# Patient Record
Sex: Male | Born: 1950 | Race: Black or African American | Hispanic: No | Marital: Single | Smoking: Current every day smoker
Health system: Southern US, Community
[De-identification: ages and names within clinical notes are randomized; demographics above are authoritative.]

## PROBLEM LIST (undated history)

## (undated) DIAGNOSIS — F32A Depression, unspecified: Secondary | ICD-10-CM

## (undated) DIAGNOSIS — F329 Major depressive disorder, single episode, unspecified: Secondary | ICD-10-CM

## (undated) DIAGNOSIS — Z91199 Patient's noncompliance with other medical treatment and regimen due to unspecified reason: Secondary | ICD-10-CM

## (undated) DIAGNOSIS — F209 Schizophrenia, unspecified: Secondary | ICD-10-CM

## (undated) DIAGNOSIS — K219 Gastro-esophageal reflux disease without esophagitis: Secondary | ICD-10-CM

## (undated) DIAGNOSIS — J189 Pneumonia, unspecified organism: Secondary | ICD-10-CM

## (undated) DIAGNOSIS — F1910 Other psychoactive substance abuse, uncomplicated: Secondary | ICD-10-CM

## (undated) DIAGNOSIS — I509 Heart failure, unspecified: Secondary | ICD-10-CM

## (undated) DIAGNOSIS — E119 Type 2 diabetes mellitus without complications: Secondary | ICD-10-CM

## (undated) DIAGNOSIS — I10 Essential (primary) hypertension: Secondary | ICD-10-CM

## (undated) DIAGNOSIS — G43909 Migraine, unspecified, not intractable, without status migrainosus: Secondary | ICD-10-CM

## (undated) DIAGNOSIS — D649 Anemia, unspecified: Secondary | ICD-10-CM

## (undated) DIAGNOSIS — N183 Chronic kidney disease, stage 3 unspecified: Secondary | ICD-10-CM

## (undated) DIAGNOSIS — I82409 Acute embolism and thrombosis of unspecified deep veins of unspecified lower extremity: Secondary | ICD-10-CM

## (undated) DIAGNOSIS — Z9119 Patient's noncompliance with other medical treatment and regimen: Secondary | ICD-10-CM

## (undated) HISTORY — PX: NO PAST SURGERIES: SHX2092

---

## 2003-01-15 ENCOUNTER — Encounter: Payer: Self-pay | Admitting: Internal Medicine

## 2003-01-15 ENCOUNTER — Inpatient Hospital Stay (HOSPITAL_COMMUNITY): Admission: EM | Admit: 2003-01-15 | Discharge: 2003-01-18 | Payer: Self-pay | Admitting: Emergency Medicine

## 2003-05-10 ENCOUNTER — Emergency Department (HOSPITAL_COMMUNITY): Admission: EM | Admit: 2003-05-10 | Discharge: 2003-05-10 | Payer: Self-pay | Admitting: Emergency Medicine

## 2003-11-20 ENCOUNTER — Inpatient Hospital Stay (HOSPITAL_COMMUNITY): Admission: EM | Admit: 2003-11-20 | Discharge: 2003-11-22 | Payer: Self-pay | Admitting: *Deleted

## 2003-12-25 ENCOUNTER — Emergency Department (HOSPITAL_COMMUNITY): Admission: EM | Admit: 2003-12-25 | Discharge: 2003-12-25 | Payer: Self-pay | Admitting: Emergency Medicine

## 2004-02-04 ENCOUNTER — Emergency Department (HOSPITAL_COMMUNITY): Admission: EM | Admit: 2004-02-04 | Discharge: 2004-02-04 | Payer: Self-pay | Admitting: Emergency Medicine

## 2004-06-20 ENCOUNTER — Emergency Department (HOSPITAL_COMMUNITY): Admission: EM | Admit: 2004-06-20 | Discharge: 2004-06-20 | Payer: Self-pay | Admitting: Emergency Medicine

## 2011-11-30 ENCOUNTER — Inpatient Hospital Stay (HOSPITAL_COMMUNITY)
Admission: EM | Admit: 2011-11-30 | Discharge: 2011-12-04 | DRG: 638 | Disposition: A | Discharge status: Home / Self Care | Payer: Medicaid Other | Attending: Internal Medicine | Admitting: Internal Medicine

## 2011-11-30 ENCOUNTER — Encounter: Payer: Self-pay | Admitting: Emergency Medicine

## 2011-11-30 DIAGNOSIS — I10 Essential (primary) hypertension: Secondary | ICD-10-CM | POA: Diagnosis present

## 2011-11-30 DIAGNOSIS — I4510 Unspecified right bundle-branch block: Secondary | ICD-10-CM | POA: Diagnosis present

## 2011-11-30 DIAGNOSIS — E1100 Type 2 diabetes mellitus with hyperosmolarity without nonketotic hyperglycemic-hyperosmolar coma (NKHHC): Principal | ICD-10-CM | POA: Diagnosis present

## 2011-11-30 DIAGNOSIS — Z794 Long term (current) use of insulin: Secondary | ICD-10-CM

## 2011-11-30 DIAGNOSIS — R112 Nausea with vomiting, unspecified: Secondary | ICD-10-CM | POA: Diagnosis present

## 2011-11-30 DIAGNOSIS — E7251 Non-ketotic hyperglycinemia: Secondary | ICD-10-CM

## 2011-11-30 DIAGNOSIS — E871 Hypo-osmolality and hyponatremia: Secondary | ICD-10-CM | POA: Diagnosis present

## 2011-11-30 LAB — URINALYSIS, ROUTINE W REFLEX MICROSCOPIC
Bilirubin Urine: NEGATIVE
Glucose, UA: >1000 mg/dL — AB
Hgb urine dipstick: NEGATIVE
Ketones, ur: NEGATIVE mg/dL
Leukocytes, UA: NEGATIVE
Nitrite: NEGATIVE
Protein, ur: NEGATIVE mg/dL
Specific Gravity, Urine: <1.005 — ABNORMAL LOW (ref 1.005–1.030)
Urobilinogen, UA: 0.2 mg/dL (ref 0.0–1.0)
pH: 5.5 (ref 5.0–8.0)

## 2011-11-30 LAB — CBC
HCT: 43.3 % (ref 39.0–52.0)
Hemoglobin: 14 g/dL (ref 13.0–17.0)
MCH: 29.4 pg (ref 26.0–34.0)
MCHC: 32.3 g/dL (ref 30.0–36.0)
MCV: 90.8 fL (ref 78.0–100.0)
Platelets: 213 10*3/uL (ref 150–400)
RBC: 4.77 MIL/uL (ref 4.22–5.81)
RDW: 13.4 % (ref 11.5–15.5)
WBC: 4.2 10*3/uL (ref 4.0–10.5)

## 2011-11-30 LAB — DIFFERENTIAL
Basophils Absolute: 0 10*3/uL (ref 0.0–0.1)
Basophils Relative: 0 % (ref 0–1)
Eosinophils Absolute: 0.1 10*3/uL (ref 0.0–0.7)
Eosinophils Relative: 2 % (ref 0–5)
Lymphocytes Relative: 28 % (ref 12–46)
Lymphs Abs: 1.2 10*3/uL (ref 0.7–4.0)
Monocytes Absolute: 0.4 10*3/uL (ref 0.1–1.0)
Monocytes Relative: 9 % (ref 3–12)
Neutro Abs: 2.6 10*3/uL (ref 1.7–7.7)
Neutrophils Relative %: 61 % (ref 43–77)

## 2011-11-30 LAB — URINE MICROSCOPIC-ADD ON

## 2011-11-30 MED ORDER — SODIUM CHLORIDE 0.9 % IV SOLN
Freq: Once | INTRAVENOUS | Status: AC
  Administered 2011-11-30: 22:00:00 via INTRAVENOUS

## 2011-11-30 MED ORDER — ONDANSETRON HCL 4 MG/2ML IJ SOLN
4.0000 mg | Freq: Once | INTRAMUSCULAR | Status: AC
  Administered 2011-11-30: 4 mg via INTRAVENOUS
  Filled 2011-11-30: qty 2

## 2011-11-30 NOTE — ED Provider Notes (Signed)
History     CSN: [id number]  Arrival date & time 11/30/11  2106   First MD Initiated Contact with Patient 11/30/11 2140      Chief Complaint  Patient presents with  . Nausea  . Emesis  . Hyperglycemia    (Consider location/radiation/quality/duration/timing/severity/associated sxs/prior treatment) HPI Comments: Patient with history of DM.  I am told his sugars are high by EMS.    Patient is a 60 y.o. male presenting with vomiting.  Emesis  This is a new problem. The current episode started 1 to 2 hours ago. The problem has been gradually worsening. The emesis has an appearance of stomach contents. There has been no fever. Pertinent negatives include no abdominal pain and no chills.    Past Medical History  Diagnosis Date  . Hypertension   . Diabetes mellitus     History reviewed. No pertinent past surgical history.  History reviewed. No pertinent family history.  History  Substance Use Topics  . Smoking status: Not on file  . Smokeless tobacco: Not on file  . Alcohol Use: Yes     occassional      Review of Systems  Constitutional: Negative for chills.  Gastrointestinal: Positive for vomiting. Negative for abdominal pain.  All other systems reviewed and are negative.    Allergies  Review of patient's allergies indicates no known allergies.  Home Medications   Current Outpatient Rx  Name Route Sig Dispense Refill  . INSULIN ASPART 100 UNIT/ML Unity SOLN Subcutaneous Inject into the skin 3 (three) times daily before meals.      Marland Kitchen LISINOPRIL 10 MG PO TABS Oral Take 10 mg by mouth daily.        BP 160/105  Pulse 107  Temp 98.2 F (36.8 C)  SpO2 97%  Physical Exam  Constitutional: He is oriented to person, place, and time. He appears well-developed and well-nourished.  HENT:  Head: Normocephalic and atraumatic.       Oropharynx dry.  Neck: Normal range of motion. Neck supple.  Cardiovascular: Normal rate and regular rhythm.  Exam reveals no friction  rub.   No murmur heard. Pulmonary/Chest: Effort normal and breath sounds normal. No respiratory distress. He has no wheezes.  Abdominal: Soft. Bowel sounds are normal. He exhibits no distension. There is no tenderness.  Musculoskeletal: Normal range of motion. He exhibits no edema.  Lymphadenopathy:    He has no cervical adenopathy.  Neurological: He is alert and oriented to person, place, and time.  Skin: Skin is warm.    ED Course  Procedures (including critical care time)  Labs Reviewed - No data to display No results found.   No diagnosis found.   Date: 12/01/2011  Rate: 100  Rhythm: sinus tachycardia  QRS Axis: normal  Intervals: normal  ST/T Wave abnormalities: nonspecific ST changes  Conduction Disutrbances:right bundle branch block  Narrative Interpretation:   Old EKG Reviewed: unchanged    MDM  Patient with profound hyperglycemia, but no evidence of ketoacidosis.  Gave 10 units of novolog, will consult medicine for admission, regulation of sugars.          Geoffery Lyons, MD 12/01/11 [phone]

## 2011-12-01 ENCOUNTER — Encounter (HOSPITAL_COMMUNITY): Payer: Self-pay | Admitting: Internal Medicine

## 2011-12-01 DIAGNOSIS — E871 Hypo-osmolality and hyponatremia: Secondary | ICD-10-CM | POA: Diagnosis present

## 2011-12-01 LAB — COMPREHENSIVE METABOLIC PANEL
ALT: 37 U/L (ref 0–53)
AST: 23 U/L (ref 0–37)
Albumin: 3.9 g/dL (ref 3.5–5.2)
Alkaline Phosphatase: 272 U/L — ABNORMAL HIGH (ref 39–117)
BUN: 20 mg/dL (ref 6–23)
CO2: 26 mEq/L (ref 19–32)
Calcium: 10.6 mg/dL — ABNORMAL HIGH (ref 8.4–10.5)
Chloride: 86 mEq/L — ABNORMAL LOW (ref 96–112)
Creatinine, Ser: 1.12 mg/dL (ref 0.50–1.35)
GFR calc Af Amer: 82 mL/min — ABNORMAL LOW (ref >90)
GFR calc non Af Amer: 71 mL/min — ABNORMAL LOW (ref >90)
Glucose, Bld: 931 mg/dL (ref 70–99)
Potassium: 4.6 mEq/L (ref 3.5–5.1)
Sodium: 125 mEq/L — ABNORMAL LOW (ref 135–145)
Total Bilirubin: 0.4 mg/dL (ref 0.3–1.2)
Total Protein: 8.6 g/dL — ABNORMAL HIGH (ref 6.0–8.3)

## 2011-12-01 LAB — ETHANOL: Alcohol, Ethyl (B): <11 mg/dL (ref 0–11)

## 2011-12-01 LAB — GLUCOSE, CAPILLARY
Glucose-Capillary: 111 mg/dL — ABNORMAL HIGH (ref 70–99)
Glucose-Capillary: 142 mg/dL — ABNORMAL HIGH (ref 70–99)
Glucose-Capillary: 154 mg/dL — ABNORMAL HIGH (ref 70–99)
Glucose-Capillary: 174 mg/dL — ABNORMAL HIGH (ref 70–99)
Glucose-Capillary: 196 mg/dL — ABNORMAL HIGH (ref 70–99)
Glucose-Capillary: 525 mg/dL — ABNORMAL HIGH (ref 70–99)
Glucose-Capillary: >600 mg/dL (ref 70–99)
Glucose-Capillary: >600 mg/dL (ref 70–99)

## 2011-12-01 LAB — CK TOTAL AND CKMB (NOT AT ARMC)
CK, MB: 5.5 ng/mL — ABNORMAL HIGH (ref 0.3–4.0)
Relative Index: 4.3 — ABNORMAL HIGH (ref 0.0–2.5)
Total CK: 128 U/L (ref 7–232)

## 2011-12-01 LAB — LIPASE, BLOOD: Lipase: 36 U/L (ref 11–59)

## 2011-12-01 LAB — TROPONIN I: Troponin I: <0.3 ng/mL (ref <0.30)

## 2011-12-01 MED ORDER — FOLIC ACID 1 MG PO TABS
1.0000 mg | ORAL_TABLET | Freq: Every day | ORAL | Status: DC
  Administered 2011-12-01 – 2011-12-04 (×5): 1 mg via ORAL
  Filled 2011-12-01 (×4): qty 1

## 2011-12-01 MED ORDER — VITAMIN B-1 100 MG PO TABS
100.0000 mg | ORAL_TABLET | Freq: Every day | ORAL | Status: DC
  Administered 2011-12-01 – 2011-12-04 (×4): 100 mg via ORAL
  Filled 2011-12-01 (×4): qty 1

## 2011-12-01 MED ORDER — INSULIN ASPART 100 UNIT/ML ~~LOC~~ SOLN
0.0000 [IU] | Freq: Three times a day (TID) | SUBCUTANEOUS | Status: DC
  Administered 2011-12-01: 1 [IU] via SUBCUTANEOUS
  Administered 2011-12-02: 7 [IU] via SUBCUTANEOUS
  Filled 2011-12-01: qty 3

## 2011-12-01 MED ORDER — ONDANSETRON HCL 4 MG PO TABS: 4.0000 mg | ORAL_TABLET | Freq: Four times a day (QID) | ORAL | Status: DC | PRN

## 2011-12-01 MED ORDER — SENNA 8.6 MG PO TABS
1.0000 | ORAL_TABLET | Freq: Two times a day (BID) | ORAL | Status: DC
  Administered 2011-12-01 – 2011-12-04 (×6): 8.6 mg via ORAL
  Filled 2011-12-01 (×8): qty 1

## 2011-12-01 MED ORDER — ACETAMINOPHEN 650 MG RE SUPP: 650.0000 mg | Freq: Four times a day (QID) | RECTAL | Status: DC | PRN

## 2011-12-01 MED ORDER — ACETAMINOPHEN 325 MG PO TABS
650.0000 mg | ORAL_TABLET | Freq: Four times a day (QID) | ORAL | Status: DC | PRN
Start: 2011-12-01 — End: 2011-12-04

## 2011-12-01 MED ORDER — SODIUM CHLORIDE 0.9 % IV SOLN
INTRAVENOUS | Status: DC
  Filled 2011-12-01: qty 1

## 2011-12-01 MED ORDER — SODIUM CHLORIDE 0.9 % IV SOLN
INTRAVENOUS | Status: DC
  Administered 2011-12-02 – 2011-12-03 (×4): via INTRAVENOUS

## 2011-12-01 MED ORDER — SODIUM CHLORIDE 0.9 % IV SOLN
INTRAVENOUS | Status: AC
  Administered 2011-12-01: 5.4 [IU]/h via INTRAVENOUS
  Filled 2011-12-01: qty 1

## 2011-12-01 MED ORDER — DOCUSATE SODIUM 100 MG PO CAPS
100.0000 mg | ORAL_CAPSULE | Freq: Two times a day (BID) | ORAL | Status: DC
  Administered 2011-12-01 – 2011-12-04 (×6): 100 mg via ORAL
  Filled 2011-12-01 (×7): qty 1

## 2011-12-01 MED ORDER — INSULIN ASPART 100 UNIT/ML ~~LOC~~ SOLN
10.0000 [IU] | Freq: Once | SUBCUTANEOUS | Status: AC
  Administered 2011-12-01: 10 [IU] via INTRAVENOUS
  Filled 2011-12-01: qty 1

## 2011-12-01 MED ORDER — ONDANSETRON HCL 4 MG/2ML IJ SOLN: 4.0000 mg | Freq: Four times a day (QID) | INTRAMUSCULAR | Status: DC | PRN

## 2011-12-01 MED ORDER — SODIUM CHLORIDE 0.9 % IV SOLN: INTRAVENOUS | Status: DC

## 2011-12-01 MED ORDER — ALUM & MAG HYDROXIDE-SIMETH 200-200-20 MG/5ML PO SUSP
30.0000 mL | Freq: Four times a day (QID) | ORAL | Status: DC | PRN
  Filled 2011-12-01: qty 30

## 2011-12-01 MED ORDER — INSULIN GLARGINE 100 UNIT/ML ~~LOC~~ SOLN
20.0000 [IU] | Freq: Every day | SUBCUTANEOUS | Status: DC
  Administered 2011-12-01: 20 [IU] via SUBCUTANEOUS
  Filled 2011-12-01: qty 3

## 2011-12-01 MED ORDER — SODIUM CHLORIDE 0.9 % IV SOLN
INTRAVENOUS | Status: AC
  Administered 2011-12-01: 125 mL/h via INTRAVENOUS

## 2011-12-01 MED ORDER — INSULIN ASPART 100 UNIT/ML ~~LOC~~ SOLN
0.0000 [IU] | Freq: Every day | SUBCUTANEOUS | Status: DC
  Administered 2011-12-01: 5 [IU] via SUBCUTANEOUS

## 2011-12-01 MED ORDER — DEXTROSE 50 % IV SOLN: 25.0000 mL | INTRAVENOUS | Status: DC | PRN

## 2011-12-01 NOTE — Progress Notes (Signed)
Patient has repeatedly denied the need to void this shift. A urinal was provided and patient encouraged to try to void. He states, "I know if I need to pee." He has refused to try to void. Bladder scan done at 1600 with a result of 197 cc urine. Will continue to monitor. Lurline Idol [facility]

## 2011-12-01 NOTE — Progress Notes (Signed)
CSW covering for the weekend visited with pt and completed psychosocial assessment (pls see shadow chart). Pt reports he just recently moved to [place] from [place] and he has been staying at [facility] at discharge. Pt reports he has already spoken to someone at the shelter and he plans to return there at discharge. Pt states he has family here in town, but he would not say that they are supportive. Pt states he needs assistance with getting medications. He denies any CSW needs at this time. CSW will let CM know that pt has expressed he needs assistance with medications. CSW called the shelter and they report the pt will need a note stating that he was a pt at the hospital, the note needs to include the admission date and discharge date. CSW following the pt will assist in discharge planning to ensure documentation is prepared for pt to return to the shelter.  Clayton [name] 12/01/2011 11:36 AM [phone]

## 2011-12-01 NOTE — ED Notes (Signed)
Pt refused CBG monitoring. 

## 2011-12-01 NOTE — ED Notes (Signed)
Pt. Transferred to the floor with RN and cardiac monitor

## 2011-12-01 NOTE — H&P (Signed)
PCP:   No primary provider on file.  Pt was previously seeing providers through [facility] and [facility], does not identify any provider though.   Chief Complaint:  Vomiting   HPI: 43yoM with h/o diabetes presents with hyperosmolar non-ketotic hyperglycemia.  Pt states he was diagnosed with DM about 7-8 yrs ago and was taking Novolog up until  about a month ago when he ran out of Rx and couldn't get more. He was previously being  seen by providers at [facility], but doesn't identify any single provider. He  is now in [place] and living at what sounds like a shelter.  He states a month ago he had some runny nose and dry cough syndrome. Then about 3 days  ago he started having some vomiting and soreness in his stomach. He endorses polyuria,  dehydration, and polydipsia. However, he is a poor historian and doesn't offer much  information.  In the ED pt was tachy 101-107 and hypertense, o/w stable. Chem panel with Na 125, K  4.6, Cl 86, renal 20/1.12, glucose 930. AlkP 272 o/w LFT's normal. CE's negative x1.  WBC 4.2, rest of CBC normal. UA with spec grav < 1.005, glucose >1000, o/w negative.  Alcohol negative. Pt was given 10u regular insulin, zofran, and IVF's.  ROS as above o/w unremarkable.    Past Medical History  Diagnosis Date  . Hypertension   . Diabetes mellitus     History reviewed. No pertinent past surgical history.  Medications:  HOME MEDS: Not reconcilable by pt  Prior to Admission medications   Medication Sig Start Date End Date Taking? Authorizing Provider  insulin aspart (NOVOLOG) 100 UNIT/ML injection Inject 25 Units into the skin 3 (three) times daily before meals.    Yes Historical Provider, MD  lisinopril (PRINIVIL,ZESTRIL) 10 MG tablet Take 10 mg by mouth daily.    Yes Historical Provider, MD    Allergies:  No Known Allergies  Social History:   reports that he has never smoked. He does not have any smokeless tobacco history on file. He reports that he  drinks alcohol. He reports that he does not use illicit drugs. Lives at [facility], shelter.   Family History: History reviewed. No pertinent family history.  Physical Exam: Filed Vitals:   12/01/11 0045 12/01/11 0100 12/01/11 0115 12/01/11 0135  BP: 131/84 146/120 134/93 144/97  Pulse: 100 102 104 109  Temp:      Resp: 16 17  16   SpO2: 99% 100% 98% 98%   Blood pressure 144/97, pulse 109, temperature 98.2 F (36.8 C), resp. rate 16, SpO2 98.00%. Gen: Middle aged, lethargic but not toxic appearing M in no distress, is quiet and not  very talkative or engaging but pleasant enough. No cardiopulmonary distress. Slightly  malodorous but not overtly disheveled  HEENT: PERRL, EOMI, sclera clear, mouth not actually that dry appearing with some caps  in place, no gross lesions.  Neck: Supple, thin, normal, no thyromegaly noted  Lungs: CTAB no w/c/r, good air movement  Heart: Tachycardic but without gross murmurs, gallops  Abd: Soft, non rigid, non tender, overall benign  Extrem: Hands are cool but not cold or cyanotic. Bilateral radials minimally palpable.  No BLE edema noted. Normal bulk  Neuro: Alert but lethargic appearing, appropriate. Moves extremities spontaneously.  Sits up in bed without assistance. Grossly non-focal     Labs & Imaging Results for orders placed during the hospital encounter of 11/30/11 (from the past 48 hour(s))  URINALYSIS, ROUTINE  W REFLEX MICROSCOPIC     Status: Abnormal   Collection Time   11/30/11 10:04 PM      Component Value Range Comment   Color, Urine STRAW (*) YELLOW     APPearance CLEAR  CLEAR     Specific Gravity, Urine <1.005 (*) 1.005 - 1.030     pH 5.5  5.0 - 8.0     Glucose, UA >1000 (*) NEGATIVE (mg/dL)    Hgb urine dipstick NEGATIVE  NEGATIVE     Bilirubin Urine NEGATIVE  NEGATIVE     Ketones, ur NEGATIVE  NEGATIVE (mg/dL)    Protein, ur NEGATIVE  NEGATIVE (mg/dL)    Urobilinogen, UA 0.2  0.0 - 1.0 (mg/dL)    Nitrite  NEGATIVE  NEGATIVE     Leukocytes, UA NEGATIVE  NEGATIVE    URINE MICROSCOPIC-ADD ON     Status: Normal   Collection Time   11/30/11 10:04 PM      Component Value Range Comment   WBC, UA 0-2  <3 (WBC/hpf)    RBC / HPF 0-2  <3 (RBC/hpf)    Bacteria, UA RARE  RARE     Urine-Other RARE YEAST     CBC     Status: Normal   Collection Time   11/30/11 11:25 PM      Component Value Range Comment   WBC 4.2  4.0 - 10.5 (K/uL)    RBC 4.77  4.22 - 5.81 (MIL/uL)    Hemoglobin 14.0  13.0 - 17.0 (g/dL)    HCT 29.5  62.1 - 30.8 (%)    MCV 90.8  78.0 - 100.0 (fL)    MCH 29.4  26.0 - 34.0 (pg)    MCHC 32.3  30.0 - 36.0 (g/dL)    RDW 65.7  84.6 - 96.2 (%)    Platelets 213  150 - 400 (K/uL)   DIFFERENTIAL     Status: Normal   Collection Time   11/30/11 11:25 PM      Component Value Range Comment   Neutrophils Relative 61  43 - 77 (%)    Neutro Abs 2.6  1.7 - 7.7 (K/uL)    Lymphocytes Relative 28  12 - 46 (%)    Lymphs Abs 1.2  0.7 - 4.0 (K/uL)    Monocytes Relative 9  3 - 12 (%)    Monocytes Absolute 0.4  0.1 - 1.0 (K/uL)    Eosinophils Relative 2  0 - 5 (%)    Eosinophils Absolute 0.1  0.0 - 0.7 (K/uL)    Basophils Relative 0  0 - 1 (%)    Basophils Absolute 0.0  0.0 - 0.1 (K/uL)   COMPREHENSIVE METABOLIC PANEL     Status: Abnormal   Collection Time   11/30/11 11:25 PM      Component Value Range Comment   Sodium 125 (*) 135 - 145 (mEq/L)    Potassium 4.6  3.5 - 5.1 (mEq/L)    Chloride 86 (*) 96 - 112 (mEq/L)    CO2 26  19 - 32 (mEq/L)    Glucose, Bld 931 (*) 70 - 99 (mg/dL)    BUN 20  6 - 23 (mg/dL)    Creatinine, Ser 9.52  0.50 - 1.35 (mg/dL)    Calcium 84.1 (*) 8.4 - 10.5 (mg/dL)    Total Protein 8.6 (*) 6.0 - 8.3 (g/dL)    Albumin 3.9  3.5 - 5.2 (g/dL)    AST 23  0 - 37 (U/L) HEMOLYSIS AT THIS  LEVEL MAY AFFECT RESULT   ALT 37  0 - 53 (U/L)    Alkaline Phosphatase 272 (*) 39 - 117 (U/L)    Total Bilirubin 0.4  0.3 - 1.2 (mg/dL)    GFR calc non Af Amer 71 (*) >90 (mL/min)    GFR  calc Af Amer 82 (*) >90 (mL/min)   LIPASE, BLOOD     Status: Normal   Collection Time   11/30/11 11:25 PM      Component Value Range Comment   Lipase 36  11 - 59 (U/L)   ETHANOL     Status: Normal   Collection Time   11/30/11 11:25 PM      Component Value Range Comment   Alcohol, Ethyl (B) <11  0 - 11 (mg/dL)   TROPONIN I     Status: Normal   Collection Time   11/30/11 11:25 PM      Component Value Range Comment   Troponin I <0.30  <0.30 (ng/mL)   CK TOTAL AND CKMB     Status: Abnormal   Collection Time   11/30/11 11:25 PM      Component Value Range Comment   Total CK 128  7 - 232 (U/L)    CK, MB 5.5 (*) 0.3 - 4.0 (ng/mL)    Relative Index 4.3 (*) 0.0 - 2.5    GLUCOSE, CAPILLARY     Status: Abnormal   Collection Time   12/01/11 12:19 AM      Component Value Range Comment   Glucose-Capillary >600 (*) 70 - 99 (mg/dL)   GLUCOSE, CAPILLARY     Status: Abnormal   Collection Time   12/01/11  2:23 AM      Component Value Range Comment   Glucose-Capillary >600 (*) 70 - 99 (mg/dL)    No results found.  ECG: NSR at 100 bpm, RAD, biatrial enlargement, no Q waves, RBBB 138 msec. Compared to  prior the RBBB is new and QRS is widened. TW all appropriate.  Impression Present on Admission:  .Diabetic hyperosmolar non-ketotic state  60yoM with h/o diabetes presents with hyperosmolar non-ketotic hyperglycemia.   1. Diabetes, hyperosmolar non-ketotic: BS still critically high per bedside meter  despite 10u regular insulin so will start glucose stabilizer, bolus the 2nd L of NS now  then start 125/hr maintenance. Encourage PO fluids.   - SW consult for access to insulin, DM educator consult  2. New RBBB, widened QRS: I would repeat ECG once his metabolic abnormalities and  tachycardia are improved to see if this is etiology. If not, consider outpt echo, but  likely nothing further to do. CE negative x1.   Regular bed, MC team 4 Presumed full code   Other plans as per  orders.  Dioselina Brumbaugh 12/01/2011, 2:38 AM

## 2011-12-01 NOTE — Progress Notes (Signed)
Subjective: Patient relates he feels much better. He also relates he ran out of insulin about a week ago. Objective: Filed Vitals:   12/01/11 0400 12/01/11 0415 12/01/11 0427 12/01/11 0500  BP: 144/103 144/101    Pulse: 103 103    Temp:   98.6 F (37 C)   TempSrc:   Oral   Resp: 16 16    Weight:    75.2 kg (165 lb 12.6 oz)  SpO2: 97% 97% 98%    Weight change:   Intake/Output Summary (Last 24 hours) at 12/01/11 0947 Last data filed at 12/01/11 0710  Gross per 24 hour  Intake  662.5 ml  Output      0 ml  Net  662.5 ml    General: Alert, awake, oriented x3, in no acute distress.  HEENT: No bruits, no goiter.  Heart: Regular rate and rhythm, without murmurs, rubs, gallops.  Lungs: Good air movement clear to auscultation Abdomen: Soft, nontender, nondistended, positive bowel sounds.  Neuro: Grossly intact, nonfocal.   Lab Results:  [facility] 11/30/11 2325  NA 125*  K 4.6  CL 86*  CO2 26  GLUCOSE 931*  BUN 20  CREATININE 1.12  CALCIUM 10.6*  MG --  PHOS --    Basename 11/30/11 2325  AST 23  ALT 37  ALKPHOS 272*  BILITOT 0.4  PROT 8.6*  ALBUMIN 3.9    Basename 11/30/11 2325  LIPASE 36  AMYLASE --    Basename 11/30/11 2325  WBC 4.2  NEUTROABS 2.6  HGB 14.0  HCT 43.3  MCV 90.8  PLT 213    Basename 11/30/11 2325  CKTOTAL 128  CKMB 5.5*  CKMBINDEX --  TROPONINI <0.30     Micro Results: No results found for this or any previous visit (from the past 240 hour(s)).  Studies/Results: No results found.  Medications: I have reviewed the patient's current medications.   Principal Problem:  *Diabetic hyperosmolar non-ketotic state Active Problems:  Hyponatremia  Hypercalcemia    Assessment and plan: -Will continue IV insulin IV fluids. His creatinine seems to be stable. He relates he has no history of heart failure. His hyponatremia, this probably pseudohyponatremia secondary to his high blood glucose. Corrected is 138. We'll continue the meds  every 4 hours once blood glucose less than 250 we'll start him on D5. And will start him Lantus plus sliding scale. And if patient hungry Will allow him to the.  -Hypercalcemia probably secondary to severe dehydration we'll continue to monitor, and will continue IV fluids.  -His hemoglobin seemed to be a 14 and his albumin is 3.9, my guess is after hydration this will drop. His MCV is borderline low and he looks disheveled. His alcohol is less than 10 we'll start him with thiamine and folate empirically. And if hemoglobin drops we'll check a ferritin and anemia panel.  LOS: 1 day   Marinda Elk M.D. Pager: [phone] [facility] 12/01/2011, 9:47 AM

## 2011-12-02 LAB — BASIC METABOLIC PANEL
BUN: 17 mg/dL (ref 6–23)
Calcium: 8.7 mg/dL (ref 8.4–10.5)
Creatinine, Ser: 0.96 mg/dL (ref 0.50–1.35)
GFR calc Af Amer: >90 mL/min (ref >90)
GFR calc non Af Amer: 90 mL/min — ABNORMAL LOW (ref >90)
Glucose, Bld: 411 mg/dL — ABNORMAL HIGH (ref 70–99)

## 2011-12-02 LAB — GLUCOSE, CAPILLARY
Glucose-Capillary: 396 mg/dL — ABNORMAL HIGH (ref 70–99)
Glucose-Capillary: 321 mg/dL — ABNORMAL HIGH (ref 70–99)
Glucose-Capillary: 263 mg/dL — ABNORMAL HIGH (ref 70–99)

## 2011-12-02 MED ORDER — INSULIN ASPART 100 UNIT/ML ~~LOC~~ SOLN
4.0000 [IU] | Freq: Three times a day (TID) | SUBCUTANEOUS | Status: DC
  Administered 2011-12-02 – 2011-12-04 (×7): 4 [IU] via SUBCUTANEOUS

## 2011-12-02 MED ORDER — INSULIN GLARGINE 100 UNIT/ML ~~LOC~~ SOLN
20.0000 [IU] | Freq: Once | SUBCUTANEOUS | Status: AC
  Administered 2011-12-02: 20 [IU] via SUBCUTANEOUS
  Filled 2011-12-02: qty 3

## 2011-12-02 MED ORDER — INSULIN ASPART 100 UNIT/ML ~~LOC~~ SOLN
0.0000 [IU] | Freq: Three times a day (TID) | SUBCUTANEOUS | Status: DC
  Administered 2011-12-02: 8 [IU] via SUBCUTANEOUS
  Administered 2011-12-02 – 2011-12-03 (×2): 11 [IU] via SUBCUTANEOUS
  Administered 2011-12-03: 8 [IU] via SUBCUTANEOUS
  Filled 2011-12-02: qty 3

## 2011-12-02 MED ORDER — INSULIN GLARGINE 100 UNIT/ML ~~LOC~~ SOLN
40.0000 [IU] | Freq: Every day | SUBCUTANEOUS | Status: DC
  Administered 2011-12-02 – 2011-12-03 (×2): 40 [IU] via SUBCUTANEOUS

## 2011-12-02 MED ORDER — INSULIN ASPART 100 UNIT/ML ~~LOC~~ SOLN
5.0000 [IU] | Freq: Once | SUBCUTANEOUS | Status: AC
  Administered 2011-12-02: 5 [IU] via SUBCUTANEOUS

## 2011-12-02 NOTE — Progress Notes (Signed)
Dr Kaylyn Layer informed of  CBG 402 mg/dl. Order received to take  CBG at 0300 am. If CBG is >250mg /dl give Novolog 5 units, if CBG is< 250 mg/dl don't give and Insulin.

## 2011-12-02 NOTE — Progress Notes (Signed)
Clinical Social Worker following to assist with discharge plans. Pt plan is to return to [facility] at discharge. Clinical Social Worker confirmed with [facility] that pt has a bed at shelter. Per MD, pt anticipated to discharge this evening if pt sugars are stable. Clinical Social Worker provided pt with letter stating admission date and discharge date for pt to provide to [facility] when he returns. No further social work needs at this time. Clinical Social Worker signing off at this time. Please re-consult if further needs arise.  Jacklynn Lewis, MSW, LCSWA  Clinical Social Work [phone]

## 2011-12-02 NOTE — Progress Notes (Signed)
Subjective: Patient feels much better. He relates that when he is discharged we'll probably go to a shelter.. Objective: Filed Vitals:   12/01/11 1700 12/01/11 1853 12/01/11 2059 12/02/11 0443  BP: 130/86 130/86 146/92 119/82  Pulse:  84 86 80  Temp:  98.2 F (36.8 C) 98 F (36.7 C) 98.2 F (36.8 C)  TempSrc:  Oral Oral Oral  Resp:  20 20 18   Height:      Weight:   78.1 kg (172 lb 2.9 oz)   SpO2:  97% 97% 100%   Weight change: 2.9 kg (6 lb 6.3 oz)  Intake/Output Summary (Last 24 hours) at 12/02/11 0803 Last data filed at 12/02/11 0500  Gross per 24 hour  Intake   5593 ml  Output   1300 ml  Net   4293 ml    General: Alert, awake, oriented x3, in no acute distress.  HEENT: No bruits, no goiter.  Heart: Regular rate and rhythm, without murmurs, rubs, gallops.  Lungs: Good air movement clear to auscultation Abdomen: Soft, nontender, nondistended, positive bowel sounds.  Neuro: Grossly intact, nonfocal.   Lab Results:  [facility] 12/02/11 0614 11/30/11 2325  NA 129* 125*  K 3.8 4.6  CL 97 86*  CO2 24 26  GLUCOSE 411* 931*  BUN 17 20  CREATININE 0.96 1.12  CALCIUM 8.7 10.6*  MG -- --  PHOS -- --    Basename 11/30/11 2325  AST 23  ALT 37  ALKPHOS 272*  BILITOT 0.4  PROT 8.6*  ALBUMIN 3.9    Basename 11/30/11 2325  LIPASE 36  AMYLASE --    Basename 11/30/11 2325  WBC 4.2  NEUTROABS 2.6  HGB 14.0  HCT 43.3  MCV 90.8  PLT 213    Basename 11/30/11 2325  CKTOTAL 128  CKMB 5.5*  CKMBINDEX --  TROPONINI <0.30     Micro Results: No results found for this or any previous visit (from the past 240 hour(s)).  Studies/Results: No results found.  Medications: I have reviewed the patient's current medications.   Active Problems:  Hyponatremia  Hypercalcemia  Uncontrolled diabetes mellitus    Assessment and plan: -Increase his insulin to 40 change his sliding scale to moderate. Social worker has contacted him about his insulin and will try to  help him.  -Is hyponatremia pseudohyponatremia. Patient seems clinically euvolemic.  -Hypercalcemia is now resolved.   LOS: 2 days   Marinda Elk M.D. Pager: [phone] [facility] 12/02/2011, 8:03 AM

## 2011-12-03 LAB — GLUCOSE, CAPILLARY: Glucose-Capillary: 350 mg/dL — ABNORMAL HIGH (ref 70–99)

## 2011-12-03 MED ORDER — INSULIN ASPART 100 UNIT/ML ~~LOC~~ SOLN
0.0000 [IU] | Freq: Three times a day (TID) | SUBCUTANEOUS | Status: DC
Start: 2011-12-03 — End: 2011-12-04
  Administered 2011-12-03: 15 [IU] via SUBCUTANEOUS
  Administered 2011-12-04: 8 [IU] via SUBCUTANEOUS
  Administered 2011-12-04: 2 [IU] via SUBCUTANEOUS

## 2011-12-03 MED ORDER — INSULIN GLARGINE 100 UNIT/ML ~~LOC~~ SOLN: 40.0000 [IU] | Freq: Every day | SUBCUTANEOUS | Status: DC

## 2011-12-03 MED ORDER — INSULIN ASPART 100 UNIT/ML ~~LOC~~ SOLN: 10.0000 [IU] | Freq: Three times a day (TID) | SUBCUTANEOUS | Status: DC

## 2011-12-03 NOTE — Discharge Summary (Addendum)
Admit date: 11/30/2011 Discharge date: 12/04/2011  Primary Care Physician:  No primary provider on file.   Discharge Diagnoses:   Active Hospital Problems  Diagnoses Date Noted   . Uncontrolled diabetes mellitus 12/02/2011     Resolved Hospital Problems  Diagnoses Date Noted Date Resolved  . Diabetic hyperosmolar non-ketotic state 12/01/2011 12/02/2011  . Hyponatremia 12/01/2011 12/03/2011  . Hypercalcemia 12/01/2011 12/03/2011     DISCHARGE MEDICATION: Current Discharge Medication List    START taking these medications   Details  insulin NPH-insulin regular (NOVOLIN 70/30) (70-30) 100 UNIT/ML injection 30 units in the morning with a breakfast 20 in the evening with supper Qty: 10 mL, Refills: 12      CONTINUE these medications which have NOT CHANGED   Details  lisinopril (PRINIVIL,ZESTRIL) 10 MG tablet Take 10 mg by mouth daily.       STOP taking these medications     insulin aspart (NOVOLOG) 100 UNIT/ML injection            Consults:     SIGNIFICANT DIAGNOSTIC STUDIES:  No results found.     No results found for this or any previous visit (from the past 240 hour(s)).  BRIEF ADMITTING H & P:  60yoM with h/o diabetes presents with hyperosmolar non-ketotic hyperglycemia.  Pt states he was diagnosed with DM about 7-8 yrs ago and was taking Novolog up until  about a month ago when he ran out of Rx and couldn't get more. He was previously being  seen by providers at [facility], but doesn't identify any single provider. He  is now in [place] and living at what sounds like a shelter.  He states a month ago he had some runny nose and dry cough syndrome. Then about 3 days  ago he started having some vomiting and soreness in his stomach. He endorses polyuria,  dehydration, and polydipsia. However, he is a poor historian and doesn't offer much  information.  In the ED pt was tachy 101-107 and hypertense, o/w stable. Chem panel with Na 125, K  4.6, Cl 86,  renal 20/1.12, glucose 930. AlkP 272 o/w LFT's normal. CE's negative x1.  WBC 4.2, rest of CBC normal. UA with spec grav < 1.005, glucose >1000, o/w negative.  Alcohol negative. Pt was given 10u regular insulin, zofran, and IVF's.  ROS as above o/w unremarkable.   Active Hospital Problems  Diagnoses Date Noted   . Uncontrolled diabetes mellitus he would go home on Lantus plus sliding scale 3 times a day. Social worker has arranged for him to get his insulin. He'll follow up with his primary care Dr. as an outpatient. I'm sure his insulin regimen will have to be adjusted.  12/02/2011     Resolved Hospital Problems  Diagnoses Date Noted Date Resolved  . Diabetic hyperosmolar non-ketotic state: This probably secondary to not being able to afford his insulin. He was started on IV fluid and IV insulin. By the next day his sugars were better controlled. And he was switched to Lantus 25. His sugars were high. So this was increased to 40. He was continue sliding scale and blood glucose became controlled.  12/01/2011 12/02/2011  . Hyponatremia: This probably secondary to pseudohyponatremia. Does resolve with correction of glucose  12/01/2011 12/03/2011  . Hypercalcemia: This probably secondary to dehydration this resolved with IV fluids.  12/01/2011 12/03/2011     Disposition and Follow-up:  Discharge Orders    Future Orders Please Complete By Expires   Diet -  low sodium heart healthy      Diet - low sodium heart healthy      Increase activity slowly      Increase activity slowly           DISCHARGE EXAM:  General: Alert, awake, oriented x3, in no acute distress.  HEENT: No bruits, no goiter.  Heart: Regular rate and rhythm, without murmurs, rubs, gallops.  Lungs: Good air movement clear to auscultation  Abdomen: Soft, nontender, nondistended, positive bowel sounds.  Neuro: Grossly intact, nonfocal.   Blood pressure 109/75, pulse 107, temperature 98.9 F (37.2 C), temperature source  Oral, resp. rate 20, height 6' (1.829 m), weight 84.4 kg (186 lb 1.1 oz), SpO2 92.00%.   Basename 12/02/11 0614  NA 129*  K 3.8  CL 97  CO2 24  GLUCOSE 411*  BUN 17  CREATININE 0.96  CALCIUM 8.7  MG --  PHOS --   No results found for this basename: AST:2,ALT:2,ALKPHOS:2,BILITOT:2,PROT:2,ALBUMIN:2 in the last 72 hours No results found for this basename: LIPASE:2,AMYLASE:2 in the last 72 hours No results found for this basename: WBC:2,NEUTROABS:2,HGB:2,HCT:2,MCV:2,PLT:2 in the last 72 hours  Signed: Marinda Elk M.D. 12/04/2011, 3:05 PM

## 2011-12-04 LAB — GLUCOSE, CAPILLARY
Glucose-Capillary: 300 mg/dL — ABNORMAL HIGH (ref 70–99)
Glucose-Capillary: 127 mg/dL — ABNORMAL HIGH (ref 70–99)

## 2011-12-04 MED ORDER — INSULIN GLARGINE 100 UNIT/ML ~~LOC~~ SOLN: 50.0000 [IU] | Freq: Every day | SUBCUTANEOUS | Status: DC

## 2011-12-04 MED ORDER — INSULIN GLARGINE 100 UNIT/ML ~~LOC~~ SOLN
10.0000 [IU] | Freq: Once | SUBCUTANEOUS | Status: AC
  Administered 2011-12-04: 10 [IU] via SUBCUTANEOUS

## 2011-12-04 MED ORDER — "BD GETTING STARTED TAKE HOME KIT: 1ML X 30 G SYRINGES, "
1.0000 | Freq: Once | Status: AC
  Administered 2011-12-04: 1
  Filled 2011-12-04: qty 1

## 2011-12-04 MED ORDER — INSULIN NPH ISOPHANE & REGULAR (70-30) 100 UNIT/ML ~~LOC~~ SUSP: SUBCUTANEOUS | Status: DC

## 2011-12-04 NOTE — Progress Notes (Addendum)
Subjective: No complains at this time. Except these are during that yesterday was a national holiday and that we should not say anything about him leaving.  Objective: Filed Vitals:   12/03/11 1341 12/03/11 1725 12/03/11 2138 12/04/11 0550  BP: 124/76 129/74 142/90 119/75  Pulse: 81 77 82 86  Temp: 98.8 F (37.1 C) 98.5 F (36.9 C) 98.4 F (36.9 C) 98 F (36.7 C)  TempSrc: Oral Oral Oral Oral  Resp: 18 20 19 20   Height:      Weight:   84.4 kg (186 lb 1.1 oz)   SpO2: 97% 95% 94% 94%   Weight change: 1.4 kg (3 lb 1.4 oz)  Intake/Output Summary (Last 24 hours) at 12/04/11 0809 Last data filed at 12/04/11 0550  Gross per 24 hour  Intake    840 ml  Output   1950 ml  Net  -1110 ml    General: Alert, awake, oriented x3, in no acute distress.  HEENT: No bruits, no goiter.  Heart: Regular rate and rhythm, without murmurs, rubs, gallops.  Lungs: Good air movement clear to auscultation Abdomen: Soft, nontender, nondistended, positive bowel sounds.  Neuro: Grossly intact, nonfocal.   Lab Results:  [facility] 12/02/11 0614  NA 129*  K 3.8  CL 97  CO2 24  GLUCOSE 411*  BUN 17  CREATININE 0.96  CALCIUM 8.7  MG --  PHOS --    Micro Results: No results found for this or any previous visit (from the past 240 hour(s)).  Studies/Results: No results found.  Medications: I have reviewed the patient's current medications.   Active Problems:  Uncontrolled diabetes mellitus    Assessment and plan: -Increase his insulin 75/25 30 units in am and 20 in evening. Social worker has contacted him about his insulin and will try to help him.    LOS: 4 days   Marinda Elk M.D. Pager: [phone] [facility] 12/04/2011, 8:09 AM

## 2011-12-04 NOTE — Progress Notes (Signed)
12/04/2011 [facility], [facility] Case Management Note [phone]   CARE MANAGEMENT NOTE 12/04/2011  Patient:  KIMBLE, HITCHENS   Account Number:  [id number]  Date Initiated:  12/04/2011  Documentation initiated by:  Fransico Michael  Subjective/Objective Assessment:   admitted on 11/30/11 with c/o vomitting. Patient with significant history of diabetes.     Action/Plan:   prior to admission, patient lived in a homeless shelter and was independent with ADLs.   Anticipated DC Date:  12/04/2011   Anticipated DC Plan:  HOME/SELF CARE      DC Planning Services  CM consult      Choice offered to / List presented to:             Status of service:  Completed, signed off Medicare Important Message given?   (If response is "NO", the following Medicare IM given date fields will be blank) Date Medicare IM given:   Date Additional Medicare IM given:    Discharge Disposition:  HOME/SELF CARE  Per UR Regulation:  Reviewed for med. necessity/level of care/duration of stay  Comments:  12/04/11-1630-J.Jesusita Jocelyn,RN,BSN  [phone]      In to speak with patient. Instructions and information regarding [facility] and eligibility appointment with department of social services given to patient and reviewed. Stressed importance of keeping appointment with [facility] or calling to reschedule as soon as conflict is realized. Prescriptions x 2 for insulin with stickers for free humalog kwikpen attached. Instructions given to patient regarding process for filling the insulin prescriptions at a local pharmacy. Information from [employer] on syringe assistance program given to patient along with an application from [employer] for patient to fill out and mail in given to patient. Patient voiced understanding regarding instructions from case manager. No further discharge needs identified. Patient being discharged today.  12/04/11-1212-J.Lutricia Horsfall  [phone]      60yo male patient admitted on 11/30/11 with c/o vomitting.  Noted to have significant history of diabetes. Prior to admission, patient lived at a homeless shelter and was independent with ADLs. Plan is to discharge patient today. In to speak with patient. Noted to not have PCP. Requesting assistance with meds and obtaining medical doctor. Spoke with Kylea at [facility]. [phone]. Patient will have to go to DSS for eligibility appointment. Follow up appointment made for Thursday January 30, 2012 at 1045 with Dr. Andrey Campanile. History and physical faxed to [facility] at [phone]. Instructions placed on follow up section for discharge and given to patient. Also spoke with French Ana in main pharmacy. Patient is eligible for ZZ fund.

## 2011-12-04 NOTE — Progress Notes (Signed)
12/04/2011 [facility], [facility] Case Management Note [phone]    CARE MANAGEMENT NOTE 12/04/2011  Patient:  GEARY, RUFO   Account Number:  [id number]  Date Initiated:  12/04/2011  Documentation initiated by:  Fransico Michael  Subjective/Objective Assessment:   admitted on 11/30/11 with c/o vomitting. Patient with significant history of diabetes.     Action/Plan:   prior to admission, patient lived in a homeless shelter and was independent with ADLs.   Anticipated DC Date:  12/04/2011   Anticipated DC Plan:  HOME/SELF CARE      DC Planning Services  CM consult      Choice offered to / List presented to:             Status of service:  Completed, signed off Medicare Important Message given?   (If response is "NO", the following Medicare IM given date fields will be blank) Date Medicare IM given:   Date Additional Medicare IM given:    Discharge Disposition:  HOME/SELF CARE  Per UR Regulation:  Reviewed for med. necessity/level of care/duration of stay  Comments:  12/04/11-1212-J.Lutricia Horsfall  [phone]      60yo male patient admitted on 11/30/11 with c/o vomitting. Noted to have significant history of diabetes. Prior to admission, patient lived at a homeless shelter and was independent with ADLs. Plan is to discharge patient today. In to speak with patient. Noted to not have PCP. Requesting assistance with meds and obtaining medical doctor. Spoke with Kylea at [facility]. [phone]. Patient will have to go to DSS for eligibility appointment. Follow up appointment made for Thursday January 30, 2012 at 1045 with Dr. Andrey Campanile. History and [facility] at [phone]. Instructions placed on follow up section for discharge and given to patient. Also spoke with French Ana in main pharmacy. Patient is eligible for ZZ fund.

## 2011-12-13 ENCOUNTER — Inpatient Hospital Stay (HOSPITAL_COMMUNITY)
Admission: EM | Admit: 2011-12-13 | Discharge: 2011-12-15 | DRG: 638 | Disposition: A | Discharge status: Home / Self Care | Payer: Medicaid Other | Attending: Internal Medicine | Admitting: Internal Medicine

## 2011-12-13 ENCOUNTER — Encounter (HOSPITAL_COMMUNITY): Payer: Self-pay | Admitting: *Deleted

## 2011-12-13 DIAGNOSIS — Z5900 Homelessness unspecified: Secondary | ICD-10-CM

## 2011-12-13 DIAGNOSIS — K056 Periodontal disease, unspecified: Secondary | ICD-10-CM | POA: Diagnosis present

## 2011-12-13 DIAGNOSIS — Z91148 Patient's other noncompliance with medication regimen for other reason: Secondary | ICD-10-CM

## 2011-12-13 DIAGNOSIS — R112 Nausea with vomiting, unspecified: Secondary | ICD-10-CM | POA: Diagnosis present

## 2011-12-13 DIAGNOSIS — K069 Disorder of gingiva and edentulous alveolar ridge, unspecified: Secondary | ICD-10-CM | POA: Diagnosis present

## 2011-12-13 DIAGNOSIS — Z9114 Patient's other noncompliance with medication regimen: Secondary | ICD-10-CM

## 2011-12-13 DIAGNOSIS — F1910 Other psychoactive substance abuse, uncomplicated: Secondary | ICD-10-CM | POA: Diagnosis present

## 2011-12-13 DIAGNOSIS — K029 Dental caries, unspecified: Secondary | ICD-10-CM | POA: Diagnosis present

## 2011-12-13 DIAGNOSIS — E1065 Type 1 diabetes mellitus with hyperglycemia: Secondary | ICD-10-CM | POA: Diagnosis present

## 2011-12-13 DIAGNOSIS — E1165 Type 2 diabetes mellitus with hyperglycemia: Secondary | ICD-10-CM

## 2011-12-13 DIAGNOSIS — F4310 Post-traumatic stress disorder, unspecified: Secondary | ICD-10-CM | POA: Diagnosis present

## 2011-12-13 DIAGNOSIS — E1069 Type 1 diabetes mellitus with other specified complication: Principal | ICD-10-CM | POA: Diagnosis present

## 2011-12-13 DIAGNOSIS — B370 Candidal stomatitis: Secondary | ICD-10-CM | POA: Diagnosis present

## 2011-12-13 DIAGNOSIS — F1010 Alcohol abuse, uncomplicated: Secondary | ICD-10-CM | POA: Diagnosis present

## 2011-12-13 DIAGNOSIS — Z91199 Patient's noncompliance with other medical treatment and regimen due to unspecified reason: Secondary | ICD-10-CM

## 2011-12-13 DIAGNOSIS — E1142 Type 2 diabetes mellitus with diabetic polyneuropathy: Secondary | ICD-10-CM | POA: Diagnosis present

## 2011-12-13 DIAGNOSIS — Z9119 Patient's noncompliance with other medical treatment and regimen: Secondary | ICD-10-CM

## 2011-12-13 DIAGNOSIS — E870 Hyperosmolality and hypernatremia: Principal | ICD-10-CM | POA: Diagnosis present

## 2011-12-13 DIAGNOSIS — E1049 Type 1 diabetes mellitus with other diabetic neurological complication: Secondary | ICD-10-CM | POA: Diagnosis present

## 2011-12-13 DIAGNOSIS — K3184 Gastroparesis: Secondary | ICD-10-CM | POA: Diagnosis present

## 2011-12-13 DIAGNOSIS — E871 Hypo-osmolality and hyponatremia: Secondary | ICD-10-CM | POA: Diagnosis present

## 2011-12-13 DIAGNOSIS — K219 Gastro-esophageal reflux disease without esophagitis: Secondary | ICD-10-CM | POA: Diagnosis present

## 2011-12-13 LAB — URINALYSIS, ROUTINE W REFLEX MICROSCOPIC
Glucose, UA: >1000 mg/dL — AB
Hgb urine dipstick: NEGATIVE
Protein, ur: NEGATIVE mg/dL
Specific Gravity, Urine: 1.035 — ABNORMAL HIGH (ref 1.005–1.030)
pH: 5.5 (ref 5.0–8.0)

## 2011-12-13 LAB — DIFFERENTIAL
Eosinophils Absolute: 0.1 10*3/uL (ref 0.0–0.7)
Lymphs Abs: 1.8 10*3/uL (ref 0.7–4.0)
Monocytes Relative: 5 % (ref 3–12)
Neutrophils Relative %: 64 % (ref 43–77)

## 2011-12-13 LAB — CBC
Hemoglobin: 14 g/dL (ref 13.0–17.0)
MCH: 30 pg (ref 26.0–34.0)
RBC: 4.66 MIL/uL (ref 4.22–5.81)

## 2011-12-13 LAB — GLUCOSE, CAPILLARY: Glucose-Capillary: 565 mg/dL (ref 70–99)

## 2011-12-13 LAB — BASIC METABOLIC PANEL
CO2: 23 mEq/L (ref 19–32)
GFR calc non Af Amer: 72 mL/min — ABNORMAL LOW (ref >90)
Glucose, Bld: 752 mg/dL (ref 70–99)
Potassium: 4.7 mEq/L (ref 3.5–5.1)
Sodium: 124 mEq/L — ABNORMAL LOW (ref 135–145)

## 2011-12-13 LAB — URINE MICROSCOPIC-ADD ON

## 2011-12-13 MED ORDER — DEXTROSE 50 % IV SOLN: 25.0000 mL | INTRAVENOUS | Status: DC | PRN

## 2011-12-13 MED ORDER — DEXTROSE-NACL 5-0.45 % IV SOLN: INTRAVENOUS | Status: DC

## 2011-12-13 MED ORDER — SODIUM CHLORIDE 0.9 % IV BOLUS (SEPSIS)
1000.0000 mL | Freq: Once | INTRAVENOUS | Status: AC
  Administered 2011-12-13: 1000 mL via INTRAVENOUS

## 2011-12-13 MED ORDER — PANTOPRAZOLE SODIUM 40 MG PO TBEC
40.0000 mg | DELAYED_RELEASE_TABLET | Freq: Every day | ORAL | Status: DC
  Administered 2011-12-14 – 2011-12-15 (×2): 40 mg via ORAL
  Filled 2011-12-13 (×3): qty 1

## 2011-12-13 MED ORDER — SODIUM CHLORIDE 0.9 % IV SOLN
INTRAVENOUS | Status: AC
  Administered 2011-12-14: 2.9 [IU]/h via INTRAVENOUS
  Filled 2011-12-13: qty 1

## 2011-12-13 MED ORDER — LISINOPRIL 10 MG PO TABS
10.0000 mg | ORAL_TABLET | Freq: Every day | ORAL | Status: DC
  Administered 2011-12-15: 10 mg via ORAL
  Filled 2011-12-13 (×2): qty 1

## 2011-12-13 MED ORDER — INSULIN REGULAR BOLUS VIA INFUSION
0.0000 [IU] | Freq: Three times a day (TID) | INTRAVENOUS | Status: DC
  Filled 2011-12-13 (×3): qty 10

## 2011-12-13 MED ORDER — ENOXAPARIN SODIUM 40 MG/0.4ML ~~LOC~~ SOLN
40.0000 mg | Freq: Every day | SUBCUTANEOUS | Status: DC
  Filled 2011-12-13 (×2): qty 0.4

## 2011-12-13 MED ORDER — FLUCONAZOLE 100 MG PO TABS
100.0000 mg | ORAL_TABLET | Freq: Every day | ORAL | Status: AC
  Administered 2011-12-14 – 2011-12-15 (×3): 100 mg via ORAL
  Filled 2011-12-13 (×3): qty 1

## 2011-12-13 MED ORDER — SODIUM CHLORIDE 0.9 % IV SOLN
INTRAVENOUS | Status: DC
  Administered 2011-12-13: 18:00:00 via INTRAVENOUS

## 2011-12-13 MED ORDER — INSULIN ASPART 100 UNIT/ML ~~LOC~~ SOLN: 10.0000 [IU] | Freq: Once | SUBCUTANEOUS | Status: DC

## 2011-12-13 MED ORDER — INFLUENZA VIRUS VACC SPLIT PF IM SUSP
0.5000 mL | INTRAMUSCULAR | Status: AC
  Administered 2011-12-14: 0.5 mL via INTRAMUSCULAR
  Filled 2011-12-13: qty 0.5

## 2011-12-13 MED ORDER — SODIUM CHLORIDE 0.9 % IV SOLN
INTRAVENOUS | Status: DC
  Administered 2011-12-14: 05:00:00 via INTRAVENOUS

## 2011-12-13 MED ORDER — INSULIN REGULAR HUMAN 100 UNIT/ML IJ SOLN: 10.0000 [IU] | Freq: Once | INTRAMUSCULAR | Status: DC

## 2011-12-13 MED ORDER — PNEUMOCOCCAL VAC POLYVALENT 25 MCG/0.5ML IJ INJ
0.5000 mL | INJECTION | INTRAMUSCULAR | Status: AC
  Administered 2011-12-14: 0.5 mL via INTRAMUSCULAR
  Filled 2011-12-13: qty 0.5

## 2011-12-13 MED ORDER — SODIUM CHLORIDE 0.9 % IV SOLN
INTRAVENOUS | Status: DC
  Administered 2011-12-14: 10:00:00 via INTRAVENOUS

## 2011-12-13 NOTE — ED Notes (Signed)
Pt given ice water to drink while waiting. Pt remains alert, active and ambulatory to triage desk to ask for water.

## 2011-12-13 NOTE — ED Notes (Cosign Needed)
Patient resting watching tv asks for diet coke and provided.

## 2011-12-13 NOTE — H&P (Signed)
History and Physical Examination  Date: 12/13/2011  Patient name: Clayton [name] Medical record number: [id number] Date of birth: 1951-07-27 Age: 61 y.o. Gender: male PCP: No primary provider on file.  Attending physician: Richarda Overlie   Chief Complaint  Patient presents with  . Hyperglycemia    Pt was hospitalized for same problem 2 weeks ago. States "I didn't have a way to get out to the pharmacy to get my insulin." Pt has not had insulin x's 2weeks. Pt also c/o abd pain.      History of Present Illness: Clayton [name] is an 61 y.o. male who is homeless who presented to ER complaining of elevated blood sugar.  Pt had been staying temporarily at the [facility]. The patient called EMS to have his blood sugar checked and found that it was greater than 500.  He came to the emergency department and was seen by the emergency room physicians and treated with IV fluids and insulin.  The patient reports that he's had abdominal pain for the past 2 days with nausea.  He reports that he vomited this morning.  The patient reports that he has not eaten in 2 days.  He reports that his acid reflux has been worse in the past several days.  He reports that he noticed a yeast infection in his groin area recently.  The patient reports that he normally takes NovoLog insulin but has not been able to afford the medication.  He reports that he has not taken Lantus in several years.  He reports that he has peripheral neuropathy and has had kidney problems related to his diabetes.  In the emergency department the patient was found to have hyperosmolar nonketotic syndrome and hospital admission was requested for further treatment.2  Past Medical History Past Medical History  Diagnosis Date  . Hypertension   . Diabetes mellitus   . Homelessness   . Periodontal disease   . Noncompliance with medications   . PTSD (post-traumatic stress disorder)   . Alcohol abuse, unspecified     Past Surgical  History History reviewed. No pertinent past surgical history.  Home Meds: Prior to Admission medications   Medication Sig Start Date End Date Taking? Authorizing Provider  glipiZIDE (GLUCOTROL) 5 MG tablet Take 5 mg by mouth 2 (two) times daily before a meal.     Yes Historical Provider, MD  insulin aspart (NOVOLOG) 100 UNIT/ML injection Inject 10 Units into the skin 3 (three) times daily before meals.     Yes Historical Provider, MD  insulin glargine (LANTUS) 100 UNIT/ML injection Inject 40 Units into the skin at bedtime.     Yes Historical Provider, MD  lisinopril (PRINIVIL,ZESTRIL) 10 MG tablet Take 10 mg by mouth daily.    Yes Historical Provider, MD  metFORMIN (GLUCOPHAGE) 1000 MG tablet Take 1,000 mg by mouth 2 (two) times daily with a meal.     Yes Historical Provider, MD    Allergies: Review of patient's allergies indicates no known allergies.  Social History:  History   Social History  . Marital Status: Single    Spouse Name: N/A    Number of Children: N/A  . Years of Education: N/A   Occupational History  . Not on file.   Social History Main Topics  . Smoking status: Never Smoker   . Smokeless tobacco: Not on file  . Alcohol Use: Yes     occasional, states he doesn't drink much but doesn't quanitfy   . Drug Use:  No  . Sexually Active: Not on file   Other Topics Concern  . Not on file   Social History Narrative   Lives at [facility], shelter.    Family History: History reviewed. No pertinent family history.  Review of Systems: Pertinent items are noted in HPI. All other systems reviewed and reported as negative.   Physical Exam: Blood pressure 157/98, pulse 86, temperature 99 F (37.2 C), temperature source Oral, resp. rate 18, weight 84.369 kg (186 lb), SpO2 100.00%. General appearance: alert, cooperative, appears older than stated age and no distress Head: Normocephalic, without obvious abnormality, atraumatic Eyes: negative Nose: no  discharge Throat: dry mucous membranes, poor dentition with severe dental caries and periodontal disease Lungs: clear to auscultation bilaterally Heart: regular rate and rhythm, S1, S2 normal, no murmur, click, rub or gallop Abdomen: soft, non-tender; bowel sounds normal; no masses,  no organomegaly Extremities: extremities normal, atraumatic, no cyanosis or edema Skin: Skin color, texture, turgor normal. No rashes or lesions Neurologic: Grossly normal, thenar wasting noted, hammertoes  Lab  And Imaging results:  Results for orders placed during the hospital encounter of 12/13/11 (from the past 24 hour(s))  GLUCOSE, CAPILLARY     Status: Abnormal   Collection Time   12/13/11 12:16 PM      Component Value Range   Glucose-Capillary >600 (*) 70 - 99 (mg/dL)   Comment 1 Notify RN    BASIC METABOLIC PANEL     Status: Abnormal   Collection Time   12/13/11  3:45 PM      Component Value Range   Sodium 124 (*) 135 - 145 (mEq/L)   Potassium 4.7  3.5 - 5.1 (mEq/L)   Chloride 89 (*) 96 - 112 (mEq/L)   CO2 23  19 - 32 (mEq/L)   Glucose, Bld 752 (*) 70 - 99 (mg/dL)   BUN 25 (*) 6 - 23 (mg/dL)   Creatinine, Ser 1.61  0.50 - 1.35 (mg/dL)   Calcium 9.8  8.4 - 09.6 (mg/dL)   GFR calc non Af Amer 72 (*) >90 (mL/min)   GFR calc Af Amer 84 (*) >90 (mL/min)  CBC     Status: Normal   Collection Time   12/13/11  3:45 PM      Component Value Range   WBC 6.2  4.0 - 10.5 (K/uL)   RBC 4.66  4.22 - 5.81 (MIL/uL)   Hemoglobin 14.0  13.0 - 17.0 (g/dL)   HCT 04.5  40.9 - 81.1 (%)   MCV 89.5  78.0 - 100.0 (fL)   MCH 30.0  26.0 - 34.0 (pg)   MCHC 33.6  30.0 - 36.0 (g/dL)   RDW 91.4  78.2 - 95.6 (%)   Platelets 186  150 - 400 (K/uL)  DIFFERENTIAL     Status: Normal   Collection Time   12/13/11  3:45 PM      Component Value Range   Neutrophils Relative 64  43 - 77 (%)   Neutro Abs 4.0  1.7 - 7.7 (K/uL)   Lymphocytes Relative 29  12 - 46 (%)   Lymphs Abs 1.8  0.7 - 4.0 (K/uL)   Monocytes Relative 5  3 - 12  (%)   Monocytes Absolute 0.3  0.1 - 1.0 (K/uL)   Eosinophils Relative 2  0 - 5 (%)   Eosinophils Absolute 0.1  0.0 - 0.7 (K/uL)   Basophils Relative 1  0 - 1 (%)   Basophils Absolute 0.0  0.0 - 0.1 (  K/uL)  URINALYSIS, ROUTINE W REFLEX MICROSCOPIC     Status: Abnormal   Collection Time   12/13/11  3:45 PM      Component Value Range   Color, Urine YELLOW  YELLOW    APPearance CLEAR  CLEAR    Specific Gravity, Urine 1.035 (*) 1.005 - 1.030    pH 5.5  5.0 - 8.0    Glucose, UA >1000 (*) NEGATIVE (mg/dL)   Hgb urine dipstick NEGATIVE  NEGATIVE    Bilirubin Urine NEGATIVE  NEGATIVE    Ketones, ur TRACE (*) NEGATIVE (mg/dL)   Protein, ur NEGATIVE  NEGATIVE (mg/dL)   Urobilinogen, UA 0.2  0.0 - 1.0 (mg/dL)   Nitrite NEGATIVE  NEGATIVE    Leukocytes, UA NEGATIVE  NEGATIVE   URINE MICROSCOPIC-ADD ON     Status: Normal   Collection Time   12/13/11  3:45 PM      Component Value Range   WBC, UA 0-2  <3 (WBC/hpf)   Urine-Other RARE YEAST    GLUCOSE, CAPILLARY     Status: Abnormal   Collection Time   12/13/11  5:10 PM      Component Value Range   Glucose-Capillary 565 (*) 70 - 99 (mg/dL)   Comment 1 Documented in Chart     Comment 2 Notify RN     EKG Results:  Orders placed during the hospital encounter of 11/30/11  . EKG     Impression  Principal Problem:  *Hyperosmolar (nonketotic) coma Active Problems:  Homelessness  Noncompliance with medications  PTSD (post-traumatic stress disorder)  Poor personal hygiene  Dental caries  Chronic periodontal disease  DM type 1 causing neurological disease, not at goal  Polyneuropathy in diabetes  Candidiasis  Gastroparesis due to DM  GERD (gastroesophageal reflux disease)  Nausea & vomiting  Neuropathy of upper extremity  Nondependent alcohol abuse   Plan  Admit, IV fluids-aggressive, IV insulin with a glucose stabilizer protocol, fluconazole for yeast treatment, based patient, flu vaccine and pneumo-vaccine is required, social services  consultation for assistance with medications.  I'm recommending discontinuing Lantus and NovoLogand considering placing patient on Relion 7030 insulin from [employer] which can be purchased over-the-counter without a prescription and cost approximately $25 per bottle which would last the patient approximately one month.  The patient also be prescribed Relion blood glucose testing strips which are about $0.25 per strip and a Relion meter.  Please see orders.  Standley Dakins MD [facility] [place], [place] [phone] 12/13/2011, 6:33 PM

## 2011-12-13 NOTE — ED Provider Notes (Signed)
History     CSN: [id number]  Arrival date & time 12/13/11  1158   First MD Initiated Contact with Patient 12/13/11 1406      Chief Complaint  Patient presents with  . Hyperglycemia    Pt was hospitalized for same problem 2 weeks ago. States "I didn't have a way to get out to the pharmacy to get my insulin." Pt has not had insulin x's 2weeks. Pt also c/o abd pain.     (Consider location/radiation/quality/duration/timing/severity/associated sxs/prior treatment) Patient is a 61 y.o. male presenting with weakness. The history is provided by the patient (pt has not taken insulin in 2 weeks). No language interpreter was used.  Weakness The primary symptoms include dizziness. Primary symptoms do not include headaches, syncope, loss of consciousness, altered mental status, seizures or loss of sensation. The symptoms began 2 days ago. The symptoms are unchanged. The neurological symptoms are diffuse. The symptoms occurred on exertion.  Dizziness also occurs with weakness.  Additional symptoms include weakness. Additional symptoms do not include neck stiffness, photophobia or hallucinations. Medical issues do not include seizures or drug use. Workup history does not include MRI.    Past Medical History  Diagnosis Date  . Hypertension   . Diabetes mellitus     History reviewed. No pertinent past surgical history.  History reviewed. No pertinent family history.  History  Substance Use Topics  . Smoking status: Never Smoker   . Smokeless tobacco: Not on file  . Alcohol Use: Yes     occasional, states he doesn't drink much but doesn't quanitfy       Review of Systems  Constitutional: Negative for fatigue.  HENT: Negative for congestion, neck stiffness, sinus pressure and ear discharge.   Eyes: Negative for photophobia and discharge.  Respiratory: Negative for cough.   Cardiovascular: Negative for chest pain and syncope.  Gastrointestinal: Negative for abdominal pain and diarrhea.    Genitourinary: Negative for frequency and hematuria.       Urinary frequency  Musculoskeletal: Negative for back pain.  Skin: Negative for rash.  Neurological: Positive for dizziness and weakness. Negative for seizures, loss of consciousness and headaches.  Hematological: Negative.   Psychiatric/Behavioral: Negative for hallucinations and altered mental status.    Allergies  Review of patient's allergies indicates no known allergies.  Home Medications   Current Outpatient Rx  Name Route Sig Dispense Refill  . GLIPIZIDE 5 MG PO TABS Oral Take 5 mg by mouth 2 (two) times daily before a meal.      . INSULIN ASPART 100 UNIT/ML [place] SOLN Subcutaneous Inject 10 Units into the skin 3 (three) times daily before meals.      . INSULIN GLARGINE 100 UNIT/ML [place] SOLN Subcutaneous Inject 40 Units into the skin at bedtime.      Marland Kitchen LISINOPRIL 10 MG PO TABS Oral Take 10 mg by mouth daily.     Marland Kitchen METFORMIN HCL 1000 MG PO TABS Oral Take 1,000 mg by mouth 2 (two) times daily with a meal.        BP 134/90  Pulse 110  Temp(Src) 99 F (37.2 C) (Oral)  Resp 22  Wt 186 lb (84.369 kg)  SpO2 100%  Physical Exam  Constitutional: He is oriented to person, place, and time. He appears well-developed.  HENT:  Head: Normocephalic and atraumatic.  Eyes: Conjunctivae and EOM are normal. No scleral icterus.  Neck: Neck supple. No thyromegaly present.  Cardiovascular: Normal rate and regular rhythm.  Exam reveals no gallop  and no friction rub.   No murmur heard. Pulmonary/Chest: No stridor. He has no wheezes. He has no rales. He exhibits no tenderness.  Abdominal: He exhibits no distension. There is no tenderness. There is no rebound.  Musculoskeletal: Normal range of motion. He exhibits no edema.  Lymphadenopathy:    He has no cervical adenopathy.  Neurological: He is oriented to person, place, and time. Coordination normal.  Skin: No rash noted. No erythema.  Psychiatric: He has a normal mood and affect. His  behavior is normal.    ED Course  Procedures (including critical care time)  Labs Reviewed  GLUCOSE, CAPILLARY - Abnormal; Notable for the following:    Glucose-Capillary >600 (*)    All other components within normal limits  CBC  DIFFERENTIAL  POCT CBG MONITORING  BASIC METABOLIC PANEL  URINALYSIS, ROUTINE W REFLEX MICROSCOPIC   No results found.   No diagnosis found.    MDM          Benny Lennert, MD 12/15/11 1343

## 2011-12-13 NOTE — ED Notes (Signed)
Insulin gtt orders did not pop up in order mangement as normally .  Found incidentally when giving report to the floor.  Orders released and gtt not up from pharmacy at the time of tfr.

## 2011-12-13 NOTE — ED Notes (Signed)
IV placed, unable to pull more than 1ml of blood back.  Flushes fine.  IVF's infusing.  Pt has call bell. Watching TV.

## 2011-12-14 ENCOUNTER — Inpatient Hospital Stay (HOSPITAL_COMMUNITY): Payer: Medicaid Other

## 2011-12-14 LAB — CBC
HCT: 40.3 % (ref 39.0–52.0)
MCH: 29.3 pg (ref 26.0–34.0)
MCH: 29.7 pg (ref 26.0–34.0)
MCHC: 33.3 g/dL (ref 30.0–36.0)
MCV: 88 fL (ref 78.0–100.0)
Platelets: 159 10*3/uL (ref 150–400)
RBC: 4.08 MIL/uL — ABNORMAL LOW (ref 4.22–5.81)
RDW: 13.1 % (ref 11.5–15.5)
WBC: 4.1 10*3/uL (ref 4.0–10.5)

## 2011-12-14 LAB — GLUCOSE, CAPILLARY
Glucose-Capillary: 116 mg/dL — ABNORMAL HIGH (ref 70–99)
Glucose-Capillary: 107 mg/dL — ABNORMAL HIGH (ref 70–99)
Glucose-Capillary: 168 mg/dL — ABNORMAL HIGH (ref 70–99)
Glucose-Capillary: 277 mg/dL — ABNORMAL HIGH (ref 70–99)

## 2011-12-14 LAB — COMPREHENSIVE METABOLIC PANEL
AST: 19 U/L (ref 0–37)
AST: 17 U/L (ref 0–37)
Alkaline Phosphatase: 96 U/L (ref 39–117)
BUN: 14 mg/dL (ref 6–23)
BUN: 13 mg/dL (ref 6–23)
CO2: 24 mEq/L (ref 19–32)
CO2: 22 mEq/L (ref 19–32)
Chloride: 104 mEq/L (ref 96–112)
Chloride: 102 mEq/L (ref 96–112)
Creatinine, Ser: 0.91 mg/dL (ref 0.50–1.35)
Creatinine, Ser: 0.87 mg/dL (ref 0.50–1.35)
GFR calc non Af Amer: >90 mL/min (ref >90)
GFR calc non Af Amer: >90 mL/min (ref >90)
Glucose, Bld: 103 mg/dL — ABNORMAL HIGH (ref 70–99)
Total Bilirubin: 0.5 mg/dL (ref 0.3–1.2)
Total Bilirubin: 0.4 mg/dL (ref 0.3–1.2)

## 2011-12-14 LAB — CARDIAC PANEL(CRET KIN+CKTOT+MB+TROPI)
Relative Index: INVALID (ref 0.0–2.5)
Relative Index: INVALID (ref 0.0–2.5)
Relative Index: INVALID (ref 0.0–2.5)
Total CK: 84 U/L (ref 7–232)
Total CK: 62 U/L (ref 7–232)
Troponin I: <0.3 ng/mL (ref <0.30)

## 2011-12-14 MED ORDER — INSULIN ASPART 100 UNIT/ML ~~LOC~~ SOLN
0.0000 [IU] | Freq: Three times a day (TID) | SUBCUTANEOUS | Status: DC
  Administered 2011-12-14: 8 [IU] via SUBCUTANEOUS
  Administered 2011-12-14: 15 [IU] via SUBCUTANEOUS
  Administered 2011-12-15: 5 [IU] via SUBCUTANEOUS
  Filled 2011-12-14: qty 3

## 2011-12-14 MED ORDER — INSULIN GLARGINE 100 UNIT/ML ~~LOC~~ SOLN
50.0000 [IU] | Freq: Every day | SUBCUTANEOUS | Status: DC
  Administered 2011-12-14 – 2011-12-15 (×2): 50 [IU] via SUBCUTANEOUS
  Filled 2011-12-14: qty 3

## 2011-12-14 NOTE — Progress Notes (Signed)
Subjective: cbg's improving ,   Objective: Vital signs in last 24 hours: Filed Vitals:   12/13/11 2241 12/13/11 2351 12/14/11 0050 12/14/11 0545  BP: 132/83 166/104 147/93 100/61  Pulse: 89 89 89 81  Temp: 98.7 F (37.1 C) 98.1 F (36.7 C)  97.9 F (36.6 C)  TempSrc: Oral Oral  Oral  Resp: 18 18  18   Height:  6\' 1"  (1.854 m)    Weight:  79.4 kg (175 lb 0.7 oz)    SpO2: 98% 97%  100%    Intake/Output Summary (Last 24 hours) at 12/14/11 0923 Last data filed at 12/14/11 0900  Gross per 24 hour  Intake 2155.5 ml  Output    700 ml  Net 1455.5 ml    Weight change:    General: Alert, awake, oriented x3, in no acute distress. HEENT: No bruits, no goiter. Heart: Regular rate and rhythm, without murmurs, rubs, gallops. Lungs: Clear to auscultation bilaterally. Abdomen: Soft, nontender, nondistended, positive bowel sounds. Extremities: No clubbing cyanosis or edema with positive pedal pulses. Neuro: Grossly intact, nonfocal.   Lab Results: Results for orders placed during the hospital encounter of 12/13/11 (from the past 24 hour(s))  GLUCOSE, CAPILLARY     Status: Abnormal   Collection Time   12/13/11 12:16 PM      Component Value Range   Glucose-Capillary >600 (*) 70 - 99 (mg/dL)   Comment 1 Notify RN    BASIC METABOLIC PANEL     Status: Abnormal   Collection Time   12/13/11  3:45 PM      Component Value Range   Sodium 124 (*) 135 - 145 (mEq/L)   Potassium 4.7  3.5 - 5.1 (mEq/L)   Chloride 89 (*) 96 - 112 (mEq/L)   CO2 23  19 - 32 (mEq/L)   Glucose, Bld 752 (*) 70 - 99 (mg/dL)   BUN 25 (*) 6 - 23 (mg/dL)   Creatinine, Ser 4.09  0.50 - 1.35 (mg/dL)   Calcium 9.8  8.4 - 81.1 (mg/dL)   GFR calc non Af Amer 72 (*) >90 (mL/min)   GFR calc Af Amer 84 (*) >90 (mL/min)  CBC     Status: Normal   Collection Time   12/13/11  3:45 PM      Component Value Range   WBC 6.2  4.0 - 10.5 (K/uL)   RBC 4.66  4.22 - 5.81 (MIL/uL)   Hemoglobin 14.0  13.0 - 17.0 (g/dL)   HCT 91.4  78.2  - 95.6 (%)   MCV 89.5  78.0 - 100.0 (fL)   MCH 30.0  26.0 - 34.0 (pg)   MCHC 33.6  30.0 - 36.0 (g/dL)   RDW 21.3  08.6 - 57.8 (%)   Platelets 186  150 - 400 (K/uL)  DIFFERENTIAL     Status: Normal   Collection Time   12/13/11  3:45 PM      Component Value Range   Neutrophils Relative 64  43 - 77 (%)   Neutro Abs 4.0  1.7 - 7.7 (K/uL)   Lymphocytes Relative 29  12 - 46 (%)   Lymphs Abs 1.8  0.7 - 4.0 (K/uL)   Monocytes Relative 5  3 - 12 (%)   Monocytes Absolute 0.3  0.1 - 1.0 (K/uL)   Eosinophils Relative 2  0 - 5 (%)   Eosinophils Absolute 0.1  0.0 - 0.7 (K/uL)   Basophils Relative 1  0 - 1 (%)   Basophils Absolute 0.0  0.0 -  0.1 (K/uL)  URINALYSIS, ROUTINE W REFLEX MICROSCOPIC     Status: Abnormal   Collection Time   12/13/11  3:45 PM      Component Value Range   Color, Urine YELLOW  YELLOW    APPearance CLEAR  CLEAR    Specific Gravity, Urine 1.035 (*) 1.005 - 1.030    pH 5.5  5.0 - 8.0    Glucose, UA >1000 (*) NEGATIVE (mg/dL)   Hgb urine dipstick NEGATIVE  NEGATIVE    Bilirubin Urine NEGATIVE  NEGATIVE    Ketones, ur TRACE (*) NEGATIVE (mg/dL)   Protein, ur NEGATIVE  NEGATIVE (mg/dL)   Urobilinogen, UA 0.2  0.0 - 1.0 (mg/dL)   Nitrite NEGATIVE  NEGATIVE    Leukocytes, UA NEGATIVE  NEGATIVE   URINE MICROSCOPIC-ADD ON     Status: Normal   Collection Time   12/13/11  3:45 PM      Component Value Range   WBC, UA 0-2  <3 (WBC/hpf)   Urine-Other RARE YEAST    GLUCOSE, CAPILLARY     Status: Abnormal   Collection Time   12/13/11  5:10 PM      Component Value Range   Glucose-Capillary 565 (*) 70 - 99 (mg/dL)   Comment 1 Documented in Chart     Comment 2 Notify RN    GLUCOSE, CAPILLARY     Status: Abnormal   Collection Time   12/14/11 12:20 AM      Component Value Range   Glucose-Capillary 352 (*) 70 - 99 (mg/dL)   Comment 1 Documented in Chart     Comment 2 Notify RN    GLUCOSE, CAPILLARY     Status: Abnormal   Collection Time   12/14/11  1:23 AM      Component Value Range     Glucose-Capillary 387 (*) 70 - 99 (mg/dL)   Comment 1 Documented in Chart     Comment 2 Notify RN    GLUCOSE, CAPILLARY     Status: Abnormal   Collection Time   12/14/11  2:33 AM      Component Value Range   Glucose-Capillary 288 (*) 70 - 99 (mg/dL)   Comment 1 Documented in Chart     Comment 2 Notify RN    GLUCOSE, CAPILLARY     Status: Abnormal   Collection Time   12/14/11  3:38 AM      Component Value Range   Glucose-Capillary 189 (*) 70 - 99 (mg/dL)   Comment 1 Documented in Chart     Comment 2 Notify RN    GLUCOSE, CAPILLARY     Status: Abnormal   Collection Time   12/14/11  4:38 AM      Component Value Range   Glucose-Capillary 169 (*) 70 - 99 (mg/dL)   Comment 1 Documented in Chart     Comment 2 Notify RN    GLUCOSE, CAPILLARY     Status: Abnormal   Collection Time   12/14/11  5:43 AM      Component Value Range   Glucose-Capillary 116 (*) 70 - 99 (mg/dL)   Comment 1 Documented in Chart     Comment 2 Notify RN    GLUCOSE, CAPILLARY     Status: Abnormal   Collection Time   12/14/11  6:46 AM      Component Value Range   Glucose-Capillary 107 (*) 70 - 99 (mg/dL)   Comment 1 Documented in Chart     Comment 2 Notify RN  COMPREHENSIVE METABOLIC PANEL     Status: Abnormal   Collection Time   12/14/11  7:00 AM      Component Value Range   Sodium 135  135 - 145 (mEq/L)   Potassium 3.6  3.5 - 5.1 (mEq/L)   Chloride 104  96 - 112 (mEq/L)   CO2 24  19 - 32 (mEq/L)   Glucose, Bld 103 (*) 70 - 99 (mg/dL)   BUN 14  6 - 23 (mg/dL)   Creatinine, Ser 0.45  0.50 - 1.35 (mg/dL)   Calcium 8.5  8.4 - 40.9 (mg/dL)   Total Protein 5.7 (*) 6.0 - 8.3 (g/dL)   Albumin 2.8 (*) 3.5 - 5.2 (g/dL)   AST 19  0 - 37 (U/L)   ALT 34  0 - 53 (U/L)   Alkaline Phosphatase 111  39 - 117 (U/L)   Total Bilirubin 0.5  0.3 - 1.2 (mg/dL)   GFR calc non Af Amer >90  >90 (mL/min)   GFR calc Af Amer >90  >90 (mL/min)  CBC     Status: Abnormal   Collection Time   12/14/11  7:00 AM      Component Value Range    WBC 4.1  4.0 - 10.5 (K/uL)   RBC 4.08 (*) 4.22 - 5.81 (MIL/uL)   Hemoglobin 12.1 (*) 13.0 - 17.0 (g/dL)   HCT 81.1 (*) 91.4 - 52.0 (%)   MCV 87.7  78.0 - 100.0 (fL)   MCH 29.7  26.0 - 34.0 (pg)   MCHC 33.8  30.0 - 36.0 (g/dL)   RDW 78.2  95.6 - 21.3 (%)   Platelets 159  150 - 400 (K/uL)  GLUCOSE, CAPILLARY     Status: Abnormal   Collection Time   12/14/11  7:49 AM      Component Value Range   Glucose-Capillary 250 (*) 70 - 99 (mg/dL)   Comment 1 Notify RN    GLUCOSE, CAPILLARY     Status: Abnormal   Collection Time   12/14/11  8:57 AM      Component Value Range   Glucose-Capillary 168 (*) 70 - 99 (mg/dL)     Micro: No results found for this or any previous visit (from the past 240 hour(s)).  Studies/Results: No results found.  Medications:  Scheduled Meds:   . enoxaparin  40 mg Subcutaneous QHS  . fluconazole  100 mg Oral Daily  . influenza  inactive virus vaccine  0.5 mL Intramuscular Tomorrow-1000  . insulin aspart  0-15 Units Subcutaneous TID WC  . insulin glargine  50 Units Subcutaneous Daily  . lisinopril  10 mg Oral Daily  . pantoprazole  40 mg Oral Q0600  . pneumococcal 23 valent vaccine  0.5 mL Intramuscular Tomorrow-1000  . sodium chloride  1,000 mL Intravenous Once  . sodium chloride  1,000 mL Intravenous Once  . DISCONTD: insulin aspart  10 Units Intravenous Once  . DISCONTD: insulin regular  10 Units Intravenous Once  . DISCONTD: insulin regular  0-10 Units Intravenous TID WC   Continuous Infusions:   . sodium chloride    . insulin (NOVOLIN-R) infusion 1.1 Units/hr (12/14/11 0900)  . DISCONTD: sodium chloride 250 mL/hr at 12/13/11 1758  . DISCONTD: sodium chloride 200 mL/hr at 12/14/11 0545  . DISCONTD: dextrose 5 % and 0.45% NaCl     PRN Meds:.dextrose   Assessment: Principal Problem:  *Hyperosmolar (nonketotic) coma Active Problems:  Homelessness  Noncompliance with medications  PTSD (post-traumatic stress disorder)  Poor personal  hygiene   Dental caries  Chronic periodontal disease  DM type 1 causing neurological disease, not at goal  Polyneuropathy in diabetes  Candidiasis  Gastroparesis due to DM  GERD (gastroesophageal reflux disease)  Nausea & vomiting  Neuropathy of upper extremity  Nondependent alcohol abuse   Plan:  #1 CBC is improved, transition to subcutaneous Lantus, sliding scale insulin #2 hypertension, continue lisinopril #3 diabetic education and counseling #4 social work consultation as the patient is homeless, history of alcohol abuse.    LOS: 1 day   [facility] 12/14/2011, 9:23 AM

## 2011-12-15 LAB — GLUCOSE, CAPILLARY

## 2011-12-15 MED ORDER — INSULIN ASPART 100 UNIT/ML ~~LOC~~ SOLN: 6.0000 [IU] | Freq: Three times a day (TID) | SUBCUTANEOUS | Status: DC

## 2011-12-15 MED ORDER — INSULIN ASPART PROT & ASPART (70-30 MIX) 100 UNIT/ML ~~LOC~~ SUSP: 20.0000 [IU] | Freq: Every day | SUBCUTANEOUS | Status: DC

## 2011-12-15 MED ORDER — GLIPIZIDE 5 MG PO TABS: 5.0000 mg | ORAL_TABLET | Freq: Two times a day (BID) | ORAL | Status: DC

## 2011-12-15 MED ORDER — INSULIN ASPART PROT & ASPART (70-30 MIX) 100 UNIT/ML ~~LOC~~ SUSP: 30.0000 [IU] | Freq: Every day | SUBCUTANEOUS | Status: DC

## 2011-12-15 MED ORDER — INSULIN ASPART PROT & ASPART (70-30 MIX) 100 UNIT/ML ~~LOC~~ SUSP
30.0000 [IU] | Freq: Every day | SUBCUTANEOUS | Status: DC
  Filled 2011-12-15: qty 3

## 2011-12-15 NOTE — Discharge Summary (Signed)
Physician Discharge Summary  Clayton [name] MRN: [id number] DOB/AGE: 02-11-1951 61 y.o.  PCP: [facility]   Admit date: 12/13/2011 Discharge date: 12/15/2011  Discharge Diagnoses:     *Hyperosmolar (nonketotic) coma    Homelessness  Noncompliance with medications  PTSD (post-traumatic stress disorder)  Poor personal hygiene  Dental caries  Chronic periodontal disease  DM type 1 causing neurological disease, not at goal  Polyneuropathy in diabetes  Candidiasis  Gastroparesis due to DM  GERD (gastroesophageal reflux disease)  Nausea & vomiting  Neuropathy of upper extremity  Nondependent alcohol abuse   Current Discharge Medication List    START taking these medications   Details  !! insulin aspart protamine-insulin aspart (NOVOLOG 70/30) (70-30) 100 UNIT/ML injection Inject 20 Units into the skin daily with supper. Qty: 1000 mL, Refills: 10    !! insulin aspart protamine-insulin aspart (NOVOLOG 70/30) (70-30) 100 UNIT/ML injection Inject 30 Units into the skin daily with breakfast. Qty: 1000 mL, Refills: 10     !! - Potential duplicate medications found. Please discuss with provider.    CONTINUE these medications which have CHANGED   Details  glipiZIDE (GLUCOTROL) 5 MG tablet Take 1 tablet (5 mg total) by mouth 2 (two) times daily before a meal. Qty: 30 tablet, Refills: 0    insulin aspart (NOVOLOG) 100 UNIT/ML injection Inject 6 Units into the skin 3 (three) times daily before meals. Qty: 1 vial, Refills: 100      CONTINUE these medications which have NOT CHANGED   Details  lisinopril (PRINIVIL,ZESTRIL) 10 MG tablet Take 10 mg by mouth daily.       STOP taking these medications     insulin glargine (LANTUS) 100 UNIT/ML injection      metFORMIN (GLUCOPHAGE) 1000 MG tablet         Discharge Condition: Stable  Disposition: Home or Self Care   Consults: None   Significant Diagnostic Studies: Dg Chest 2 View  12/14/2011  *RADIOLOGY REPORT*   Clinical Data: Cough  CHEST - 2 VIEW  Comparison: None.  Findings: Lungs are clear. No pleural effusion or pneumothorax.  Cardiomediastinal silhouette is within normal limits.  Mild degenerative changes of the visualized thoracolumbar spine.  IMPRESSION: No evidence of acute cardiopulmonary disease.  Original Report Authenticated By: Charline Bills, M.D.      Microbiology: No results found for this or any previous visit (from the past 240 hour(s)).   Labs: Results for orders placed during the hospital encounter of 12/13/11 (from the past 48 hour(s))  GLUCOSE, CAPILLARY     Status: Abnormal   Collection Time   12/13/11 12:16 PM      Component Value Range Comment   Glucose-Capillary >600 (*) 70 - 99 (mg/dL)    Comment 1 Notify RN     BASIC METABOLIC PANEL     Status: Abnormal   Collection Time   12/13/11  3:45 PM      Component Value Range Comment   Sodium 124 (*) 135 - 145 (mEq/L)    Potassium 4.7  3.5 - 5.1 (mEq/L)    Chloride 89 (*) 96 - 112 (mEq/L)    CO2 23  19 - 32 (mEq/L)    Glucose, Bld 752 (*) 70 - 99 (mg/dL)    BUN 25 (*) 6 - 23 (mg/dL)    Creatinine, Ser 4.09  0.50 - 1.35 (mg/dL)    Calcium 9.8  8.4 - 10.5 (mg/dL)    GFR calc non Af Amer 72 (*) >90 (  mL/min)    GFR calc Af Amer 84 (*) >90 (mL/min)   CBC     Status: Normal   Collection Time   12/13/11  3:45 PM      Component Value Range Comment   WBC 6.2  4.0 - 10.5 (K/uL)    RBC 4.66  4.22 - 5.81 (MIL/uL)    Hemoglobin 14.0  13.0 - 17.0 (g/dL)    HCT 96.0  45.4 - 09.8 (%)    MCV 89.5  78.0 - 100.0 (fL)    MCH 30.0  26.0 - 34.0 (pg)    MCHC 33.6  30.0 - 36.0 (g/dL)    RDW 11.9  14.7 - 82.9 (%)    Platelets 186  150 - 400 (K/uL)   DIFFERENTIAL     Status: Normal   Collection Time   12/13/11  3:45 PM      Component Value Range Comment   Neutrophils Relative 64  43 - 77 (%)    Neutro Abs 4.0  1.7 - 7.7 (K/uL)    Lymphocytes Relative 29  12 - 46 (%)    Lymphs Abs 1.8  0.7 - 4.0 (K/uL)    Monocytes Relative 5  3 - 12 (%)     Monocytes Absolute 0.3  0.1 - 1.0 (K/uL)    Eosinophils Relative 2  0 - 5 (%)    Eosinophils Absolute 0.1  0.0 - 0.7 (K/uL)    Basophils Relative 1  0 - 1 (%)    Basophils Absolute 0.0  0.0 - 0.1 (K/uL)   URINALYSIS, ROUTINE W REFLEX MICROSCOPIC     Status: Abnormal   Collection Time   12/13/11  3:45 PM      Component Value Range Comment   Color, Urine YELLOW  YELLOW     APPearance CLEAR  CLEAR     Specific Gravity, Urine 1.035 (*) 1.005 - 1.030     pH 5.5  5.0 - 8.0     Glucose, UA >1000 (*) NEGATIVE (mg/dL)    Hgb urine dipstick NEGATIVE  NEGATIVE     Bilirubin Urine NEGATIVE  NEGATIVE     Ketones, ur TRACE (*) NEGATIVE (mg/dL)    Protein, ur NEGATIVE  NEGATIVE (mg/dL)    Urobilinogen, UA 0.2  0.0 - 1.0 (mg/dL)    Nitrite NEGATIVE  NEGATIVE     Leukocytes, UA NEGATIVE  NEGATIVE    URINE MICROSCOPIC-ADD ON     Status: Normal   Collection Time   12/13/11  3:45 PM      Component Value Range Comment   WBC, UA 0-2  <3 (WBC/hpf)    Urine-Other RARE YEAST     GLUCOSE, CAPILLARY     Status: Abnormal   Collection Time   12/13/11  5:10 PM      Component Value Range Comment   Glucose-Capillary 565 (*) 70 - 99 (mg/dL)    Comment 1 Documented in Chart      Comment 2 Notify RN     GLUCOSE, CAPILLARY     Status: Abnormal   Collection Time   12/14/11 12:20 AM      Component Value Range Comment   Glucose-Capillary 352 (*) 70 - 99 (mg/dL)    Comment 1 Documented in Chart      Comment 2 Notify RN     GLUCOSE, CAPILLARY     Status: Abnormal   Collection Time   12/14/11  1:23 AM      Component Value Range Comment  Glucose-Capillary 387 (*) 70 - 99 (mg/dL)    Comment 1 Documented in Chart      Comment 2 Notify RN     GLUCOSE, CAPILLARY     Status: Abnormal   Collection Time   12/14/11  2:33 AM      Component Value Range Comment   Glucose-Capillary 288 (*) 70 - 99 (mg/dL)    Comment 1 Documented in Chart      Comment 2 Notify RN     GLUCOSE, CAPILLARY     Status: Abnormal   Collection  Time   12/14/11  3:38 AM      Component Value Range Comment   Glucose-Capillary 189 (*) 70 - 99 (mg/dL)    Comment 1 Documented in Chart      Comment 2 Notify RN     GLUCOSE, CAPILLARY     Status: Abnormal   Collection Time   12/14/11  4:38 AM      Component Value Range Comment   Glucose-Capillary 169 (*) 70 - 99 (mg/dL)    Comment 1 Documented in Chart      Comment 2 Notify RN     GLUCOSE, CAPILLARY     Status: Abnormal   Collection Time   12/14/11  5:43 AM      Component Value Range Comment   Glucose-Capillary 116 (*) 70 - 99 (mg/dL)    Comment 1 Documented in Chart      Comment 2 Notify RN     GLUCOSE, CAPILLARY     Status: Abnormal   Collection Time   12/14/11  6:46 AM      Component Value Range Comment   Glucose-Capillary 107 (*) 70 - 99 (mg/dL)    Comment 1 Documented in Chart      Comment 2 Notify RN     COMPREHENSIVE METABOLIC PANEL     Status: Abnormal   Collection Time   12/14/11  7:00 AM      Component Value Range Comment   Sodium 135  135 - 145 (mEq/L)    Potassium 3.6  3.5 - 5.1 (mEq/L)    Chloride 104  96 - 112 (mEq/L)    CO2 24  19 - 32 (mEq/L)    Glucose, Bld 103 (*) 70 - 99 (mg/dL)    BUN 14  6 - 23 (mg/dL)    Creatinine, Ser 1.61  0.50 - 1.35 (mg/dL)    Calcium 8.5  8.4 - 10.5 (mg/dL)    Total Protein 5.7 (*) 6.0 - 8.3 (g/dL)    Albumin 2.8 (*) 3.5 - 5.2 (g/dL)    AST 19  0 - 37 (U/L)    ALT 34  0 - 53 (U/L)    Alkaline Phosphatase 111  39 - 117 (U/L)    Total Bilirubin 0.5  0.3 - 1.2 (mg/dL)    GFR calc non Af Amer >90  >90 (mL/min)    GFR calc Af Amer >90  >90 (mL/min)   CBC     Status: Abnormal   Collection Time   12/14/11  7:00 AM      Component Value Range Comment   WBC 4.1  4.0 - 10.5 (K/uL)    RBC 4.08 (*) 4.22 - 5.81 (MIL/uL)    Hemoglobin 12.1 (*) 13.0 - 17.0 (g/dL)    HCT 09.6 (*) 04.5 - 52.0 (%)    MCV 87.7  78.0 - 100.0 (fL)    MCH 29.7  26.0 - 34.0 (pg)    MCHC  33.8  30.0 - 36.0 (g/dL)    RDW 40.9  81.1 - 91.4 (%)    Platelets 159  150 -  400 (K/uL)   CARDIAC PANEL(CRET KIN+CKTOT+MB+TROPI)     Status: Normal   Collection Time   12/14/11  7:12 AM      Component Value Range Comment   Total CK 65  7 - 232 (U/L)    CK, MB 3.8  0.3 - 4.0 (ng/mL)    Troponin I <0.30  <0.30 (ng/mL)    Relative Index RELATIVE INDEX IS INVALID  0.0 - 2.5    GLUCOSE, CAPILLARY     Status: Abnormal   Collection Time   12/14/11  7:49 AM      Component Value Range Comment   Glucose-Capillary 250 (*) 70 - 99 (mg/dL)    Comment 1 Notify RN     GLUCOSE, CAPILLARY     Status: Abnormal   Collection Time   12/14/11  8:57 AM      Component Value Range Comment   Glucose-Capillary 168 (*) 70 - 99 (mg/dL)   GLUCOSE, CAPILLARY     Status: Abnormal   Collection Time   12/14/11 10:07 AM      Component Value Range Comment   Glucose-Capillary 129 (*) 70 - 99 (mg/dL)    Comment 1 Notify RN     COMPREHENSIVE METABOLIC PANEL     Status: Abnormal   Collection Time   12/14/11 10:44 AM      Component Value Range Comment   Sodium 130 (*) 135 - 145 (mEq/L)    Potassium 3.5  3.5 - 5.1 (mEq/L)    Chloride 102  96 - 112 (mEq/L)    CO2 22  19 - 32 (mEq/L)    Glucose, Bld 210 (*) 70 - 99 (mg/dL)    BUN 13  6 - 23 (mg/dL)    Creatinine, Ser 7.82  0.50 - 1.35 (mg/dL)    Calcium 8.1 (*) 8.4 - 10.5 (mg/dL)    Total Protein 5.4 (*) 6.0 - 8.3 (g/dL)    Albumin 2.5 (*) 3.5 - 5.2 (g/dL)    AST 17  0 - 37 (U/L)    ALT 30  0 - 53 (U/L)    Alkaline Phosphatase 96  39 - 117 (U/L)    Total Bilirubin 0.4  0.3 - 1.2 (mg/dL)    GFR calc non Af Amer >90  >90 (mL/min)    GFR calc Af Amer >90  >90 (mL/min)   GLUCOSE, CAPILLARY     Status: Abnormal   Collection Time   12/14/11 11:20 AM      Component Value Range Comment   Glucose-Capillary 252 (*) 70 - 99 (mg/dL)    Comment 1 Notify RN     CARDIAC PANEL(CRET KIN+CKTOT+MB+TROPI)     Status: Normal   Collection Time   12/14/11  3:12 PM      Component Value Range Comment   Total CK 62  7 - 232 (U/L)    CK, MB 4.0  0.3 - 4.0 (ng/mL)     Troponin I <0.30  <0.30 (ng/mL)    Relative Index RELATIVE INDEX IS INVALID  0.0 - 2.5    GLUCOSE, CAPILLARY     Status: Abnormal   Collection Time   12/14/11  5:06 PM      Component Value Range Comment   Glucose-Capillary 361 (*) 70 - 99 (mg/dL)   GLUCOSE, CAPILLARY     Status: Abnormal   Collection  Time   12/14/11  9:23 PM      Component Value Range Comment   Glucose-Capillary 277 (*) 70 - 99 (mg/dL)   GLUCOSE, CAPILLARY     Status: Normal   Collection Time   12/15/11  7:10 AM      Component Value Range Comment   Glucose-Capillary 88  70 - 99 (mg/dL)    Comment 1 Notify RN        HPI :61 y.o. male who is homeless who presented to ER complaining of elevated blood sugar. Pt had been staying temporarily at the [facility]. The patient called EMS to have his blood sugar checked and found that it was greater than 600. He came to the emergency department and was seen by the emergency room physicians and treated with IV fluids and insulin. The patient reports that he's had abdominal pain for the past 2 days with nausea. He reports that he vomited this morning. The patient reports that he has not eaten in 2 days. He reports that his acid reflux has been worse in the past several days. He reports that he noticed a yeast infection in his groin area recently. The patient reports that he normally takes NovoLog insulin but has not been able to afford the medication. He reports that he has not taken Lantus in several years. The last time he was discharged from [facility], he was discharged on insulin 70/30. The patient states that he has misplaced his prescriptions. He does not seem to be motivated to be compliant with his regimen at all. In the ER workup revealed no specific reason for his abdominal pain. His workup was essentially negative except for his elevated CBGs. An infectious workup was negative In the emergency department the patient was found to have hyperosmolar nonketotic  syndrome and hospital admission was requested for further treatment.2  HOSPITAL COURSE:   #1 hyperosmolar hyperglycemic state, he was treated on glucose stabilizer protocol. He was hydrated aggressively with IV fluids. He was transitioned to insulin 70 x 30. 30 units in the morning and 20 units in the evening. He still does not appear to be motivated to fill his prescriptions. I have requested the social worker and the caseworker to ensure that he does do this. I have requested the case manager to ensure that he has a followup appointment in  [facility] .  #2Hyponatremia:  This probably secondary to pseudohyponatremia. Does resolve with correction of glucose      Discharge Exam:  Blood pressure 145/75, pulse 82, temperature 98.4 F (36.9 C), temperature source Oral, resp. rate 18, height 6\' 1"  (1.854 m), weight 79.4 kg (175 lb 0.7 oz), SpO2 98.00%.  General: Alert, awake, oriented x3, in no acute distress. HEENT: No bruits, no goiter. Heart: Regular rate and rhythm, without murmurs, rubs, gallops. Lungs: Clear to auscultation bilaterally. Abdomen: Soft, nontender, nondistended, positive bowel sounds. Extremities: No clubbing cyanosis or edema with positive pedal pulses. Neuro: Grossly intact, nonfocal.     Discharge Orders    Future Orders Please Complete By Expires   Diet - low sodium heart healthy      Increase activity slowly      Call MD for:  temperature >100.4      Call MD for:  persistant nausea and vomiting      Call MD for:  difficulty breathing, headache or visual disturbances           Signed: Naraya Stoneberg 12/15/2011, 10:22 AM

## 2011-12-15 NOTE — Progress Notes (Signed)
Clinical Social Work received a phone call from MD that patient is ready for dc today, but patient is malingering and not wanting to go back to shelter because he does not have any transportation or medications.  CSW explained that transportation can be arranged and patient can have a bus pass to help transport.  Patient reports he normally gets around by walking.  He reports he does not have his medication, and CSW explained that CM is working on getting meds, thus patient will have insulin at dc.  Patient is very upset and agitated because he does not understand why everyone is rushing him out the door.  CSW explained that patient is medically stable for dc and not meeting criteria for inpatient at this time.  RN will be given bus pass for patient to have so he can transport.  NO other needs identified per patient.  Dahlia Client Nail, MSW LCSW [phone]  [phone] weekend coverage.

## 2011-12-15 NOTE — Progress Notes (Signed)
Gave pt discharge instructions and prescriptions. Pt verbalized understanding of instructions given. Gave pt bus passes and he left via wheelchair with the tech.

## 2011-12-15 NOTE — Progress Notes (Signed)
Cm spoke with pt concerning d/c planning. Per pt discharging to homeless shelter. Per pt to contact [facility] 12/16/11 concerning missed appt due to hospitalization.  CSW to provide pt with bus voucher to return to shelter.  Leonie Green [phone]

## 2011-12-16 NOTE — Progress Notes (Signed)
UR complete 

## 2011-12-21 ENCOUNTER — Emergency Department (HOSPITAL_COMMUNITY)
Admission: EM | Admit: 2011-12-21 | Discharge: 2011-12-21 | Disposition: A | Discharge status: Home / Self Care | Payer: Medicaid Other | Attending: Emergency Medicine | Admitting: Emergency Medicine

## 2011-12-21 ENCOUNTER — Inpatient Hospital Stay (HOSPITAL_COMMUNITY)
Admission: EM | Admit: 2011-12-21 | Discharge: 2011-12-24 | DRG: 074 | Disposition: A | Discharge status: Home / Self Care | Payer: Medicaid Other | Attending: Internal Medicine | Admitting: Internal Medicine

## 2011-12-21 ENCOUNTER — Encounter (HOSPITAL_COMMUNITY): Payer: Self-pay | Admitting: *Deleted

## 2011-12-21 ENCOUNTER — Other Ambulatory Visit: Payer: Self-pay

## 2011-12-21 ENCOUNTER — Encounter (HOSPITAL_COMMUNITY): Payer: Self-pay | Admitting: Emergency Medicine

## 2011-12-21 DIAGNOSIS — Z79899 Other long term (current) drug therapy: Secondary | ICD-10-CM | POA: Insufficient documentation

## 2011-12-21 DIAGNOSIS — Z794 Long term (current) use of insulin: Secondary | ICD-10-CM | POA: Insufficient documentation

## 2011-12-21 DIAGNOSIS — I471 Supraventricular tachycardia: Secondary | ICD-10-CM

## 2011-12-21 DIAGNOSIS — I10 Essential (primary) hypertension: Secondary | ICD-10-CM | POA: Diagnosis present

## 2011-12-21 DIAGNOSIS — E1142 Type 2 diabetes mellitus with diabetic polyneuropathy: Secondary | ICD-10-CM

## 2011-12-21 DIAGNOSIS — I472 Ventricular tachycardia, unspecified: Secondary | ICD-10-CM | POA: Diagnosis present

## 2011-12-21 DIAGNOSIS — F4310 Post-traumatic stress disorder, unspecified: Secondary | ICD-10-CM

## 2011-12-21 DIAGNOSIS — E1143 Type 2 diabetes mellitus with diabetic autonomic (poly)neuropathy: Secondary | ICD-10-CM

## 2011-12-21 DIAGNOSIS — I4729 Other ventricular tachycardia: Secondary | ICD-10-CM | POA: Diagnosis present

## 2011-12-21 DIAGNOSIS — Z5900 Homelessness unspecified: Secondary | ICD-10-CM

## 2011-12-21 DIAGNOSIS — E1101 Type 2 diabetes mellitus with hyperosmolarity with coma: Secondary | ICD-10-CM

## 2011-12-21 DIAGNOSIS — R460 Very low level of personal hygiene: Secondary | ICD-10-CM

## 2011-12-21 DIAGNOSIS — K3184 Gastroparesis: Secondary | ICD-10-CM | POA: Diagnosis present

## 2011-12-21 DIAGNOSIS — R739 Hyperglycemia, unspecified: Secondary | ICD-10-CM

## 2011-12-21 DIAGNOSIS — Z9114 Patient's other noncompliance with medication regimen: Secondary | ICD-10-CM

## 2011-12-21 DIAGNOSIS — Z91199 Patient's noncompliance with other medical treatment and regimen due to unspecified reason: Secondary | ICD-10-CM

## 2011-12-21 DIAGNOSIS — E860 Dehydration: Secondary | ICD-10-CM

## 2011-12-21 DIAGNOSIS — Z9119 Patient's noncompliance with other medical treatment and regimen: Secondary | ICD-10-CM

## 2011-12-21 DIAGNOSIS — E119 Type 2 diabetes mellitus without complications: Secondary | ICD-10-CM | POA: Insufficient documentation

## 2011-12-21 DIAGNOSIS — E1149 Type 2 diabetes mellitus with other diabetic neurological complication: Principal | ICD-10-CM | POA: Diagnosis present

## 2011-12-21 DIAGNOSIS — E1165 Type 2 diabetes mellitus with hyperglycemia: Secondary | ICD-10-CM

## 2011-12-21 DIAGNOSIS — K055 Other periodontal diseases: Secondary | ICD-10-CM | POA: Diagnosis present

## 2011-12-21 DIAGNOSIS — K029 Dental caries, unspecified: Secondary | ICD-10-CM | POA: Diagnosis present

## 2011-12-21 DIAGNOSIS — G5690 Unspecified mononeuropathy of unspecified upper limb: Secondary | ICD-10-CM

## 2011-12-21 DIAGNOSIS — B3749 Other urogenital candidiasis: Secondary | ICD-10-CM | POA: Diagnosis present

## 2011-12-21 DIAGNOSIS — E1049 Type 1 diabetes mellitus with other diabetic neurological complication: Secondary | ICD-10-CM

## 2011-12-21 DIAGNOSIS — R112 Nausea with vomiting, unspecified: Secondary | ICD-10-CM

## 2011-12-21 DIAGNOSIS — K056 Periodontal disease, unspecified: Secondary | ICD-10-CM

## 2011-12-21 DIAGNOSIS — K219 Gastro-esophageal reflux disease without esophagitis: Secondary | ICD-10-CM | POA: Diagnosis present

## 2011-12-21 LAB — DIFFERENTIAL
Eosinophils Relative: 2 % (ref 0–5)
Lymphocytes Relative: 25 % (ref 12–46)
Lymphs Abs: 1.1 10*3/uL (ref 0.7–4.0)
Monocytes Absolute: 0.3 10*3/uL (ref 0.1–1.0)

## 2011-12-21 LAB — CBC
HCT: 37.6 % — ABNORMAL LOW (ref 39.0–52.0)
MCV: 90.2 fL (ref 78.0–100.0)
RBC: 4.17 MIL/uL — ABNORMAL LOW (ref 4.22–5.81)
WBC: 4.4 10*3/uL (ref 4.0–10.5)

## 2011-12-21 LAB — BASIC METABOLIC PANEL
BUN: 25 mg/dL — ABNORMAL HIGH (ref 6–23)
CO2: 25 mEq/L (ref 19–32)
Calcium: 10 mg/dL (ref 8.4–10.5)
Creatinine, Ser: 1.03 mg/dL (ref 0.50–1.35)
Glucose, Bld: 784 mg/dL (ref 70–99)

## 2011-12-21 MED ORDER — INSULIN ASPART 100 UNIT/ML ~~LOC~~ SOLN
10.0000 [IU] | Freq: Once | SUBCUTANEOUS | Status: AC
  Administered 2011-12-21: 10 [IU] via SUBCUTANEOUS
  Filled 2011-12-21: qty 1

## 2011-12-21 MED ORDER — SODIUM CHLORIDE 0.9 % IV BOLUS (SEPSIS)
1000.0000 mL | Freq: Once | INTRAVENOUS | Status: AC
  Administered 2011-12-21: 1000 mL via INTRAVENOUS

## 2011-12-21 NOTE — ED Notes (Signed)
md wickline alerted of pt critical value 784 mg/dl

## 2011-12-21 NOTE — ED Notes (Signed)
Report given inman, rn 

## 2011-12-21 NOTE — ED Notes (Signed)
CBG registered HIGH on ED Glucometer 

## 2011-12-21 NOTE — ED Notes (Signed)
Pt alert and oriented x4. Respirations even and unlabored, bilateral symmetrical rise and fall of chest. Skin warm and dry. In no acute distress. Denies needs.   

## 2011-12-21 NOTE — ED Notes (Signed)
CBG 235 

## 2011-12-21 NOTE — ED Notes (Signed)
Assisted patient into gown and red non-slip socks applied

## 2011-12-21 NOTE — ED Provider Notes (Signed)
61yo M, hx DM and non-compliant with home insulin, here today with hyperglycemia.  Admitted to ED staff he did not take his insulin today.  Has freq admits for DM/hyperglycemia due to intentional non-compliance with his insulin.  Hyperglycemic today, but not acidotic. AG 13.  Being given IVF and SQ insulin.  Plan to recheck CBG and likely d/c.    BASIC METABOLIC PANEL      Component Value Range   Sodium 127 (*) 135 - 145 (mEq/L)   Potassium 4.6  3.5 - 5.1 (mEq/L)   Chloride 89 (*) 96 - 112 (mEq/L)   CO2 25  19 - 32 (mEq/L)   Glucose, Bld 784 (*) 70 - 99 (mg/dL)   BUN 25 (*) 6 - 23 (mg/dL)   Creatinine, Ser 1.61  0.50 - 1.35 (mg/dL)   Calcium 09.6  8.4 - 10.5 (mg/dL)   GFR calc non Af Amer 78 (*) >90 (mL/min)   GFR calc Af Amer >90  >90 (mL/min)    8:45 PM:  VS remain stable.  After IVF and pt's normal SQ insulin dose, pt's CBG improved to 235.  Pt has his insulin, does not need rx.  Will d/c stable.         Kevina Piloto Allison Quarry, DO 12/22/11 1539

## 2011-12-21 NOTE — ED Notes (Signed)
CBG registered 544 on ED Glucometer

## 2011-12-21 NOTE — ED Notes (Signed)
Pt is resting, no distress.

## 2011-12-21 NOTE — ED Notes (Signed)
md at bedside

## 2011-12-21 NOTE — ED Notes (Signed)
UEA:[id number]<WJ> Expected date:12/21/11<BR> Expected time:12:44 PM<BR> Means of arrival:Ambulance<BR> Comments:<BR>  Ems, hyperglycemia

## 2011-12-21 NOTE — ED Provider Notes (Signed)
History     CSN: [id number]  Arrival date & time 12/21/11  1313   First MD Initiated Contact with Patient 12/21/11 1432      Chief Complaint  Patient presents with  . Hyperglycemia     The history is provided by the patient.  hyperglycemia  Onset - several days ago Course - worsening Duration - constant Improved by - nothing Worsened by - nothing Associated symptoms - none  Pt here for hyperglycemia, admits to noncompliance Denies cp/sob/abd pain/vomiting/fever   Past Medical History  Diagnosis Date  . Hypertension   . Diabetes mellitus   . Homelessness   . Periodontal disease   . Noncompliance with medications   . PTSD (post-traumatic stress disorder)   . Alcohol abuse, unspecified     History reviewed. No pertinent past surgical history.  History reviewed. No pertinent family history.  History  Substance Use Topics  . Smoking status: Never Smoker   . Smokeless tobacco: Not on file  . Alcohol Use: Yes     occasional, states he doesn't drink much but doesn't quanitfy       Review of Systems  All other systems reviewed and are negative.    Allergies  Review of patient's allergies indicates no known allergies.  Home Medications   Current Outpatient Rx  Name Route Sig Dispense Refill  . GLIPIZIDE 5 MG PO TABS Oral Take 1 tablet (5 mg total) by mouth 2 (two) times daily before a meal. 30 tablet 0  . INSULIN ASPART 100 UNIT/ML [place] SOLN Subcutaneous Inject 6 Units into the skin 3 (three) times daily before meals. 1 vial 100  . INSULIN ASPART PROT & ASPART (70-30) 100 UNIT/ML [place] SUSP Subcutaneous Inject 20 Units into the skin daily with supper. 1000 mL 10  . INSULIN ASPART PROT & ASPART (70-30) 100 UNIT/ML [place] SUSP Subcutaneous Inject 30 Units into the skin daily with breakfast. 1000 mL 10  . LISINOPRIL 10 MG PO TABS Oral Take 10 mg by mouth daily.       BP 149/101  Pulse 107  Temp(Src) 98.4 F (36.9 C) (Oral)  Resp 16  SpO2 94%  Physical  Exam CONSTITUTIONAL: Well developed/well nourished HEAD AND FACE: Normocephalic/atraumatic EYES: EOMI/PERRL ENMT: Mucous membranes moist NECK: supple no meningeal signs SPINE:entire spine nontender CV: S1/S2 noted, no murmurs/rubs/gallops noted LUNGS: Lungs are clear to auscultation bilaterally, no apparent distress ABDOMEN: soft, nontender, no rebound or guarding GU:no cva tenderness NEURO: Pt is awake/alert, moves all extremitiesx4 EXTREMITIES: pulses normal, full ROM SKIN: warm, color normal, no abscess/ulcers noted to either foot PSYCH: no abnormalities of mood noted  ED Course  Procedures  Labs Reviewed  CBC - Abnormal; Notable for the following:    RBC 4.17 (*)    Hemoglobin 12.8 (*)    HCT 37.6 (*)    All other components within normal limits  BASIC METABOLIC PANEL - Abnormal; Notable for the following:    Sodium 127 (*)    Chloride 89 (*)    Glucose, Bld 784 (*)    BUN 25 (*)    GFR calc non Af Amer 78 (*)    All other components within normal limits  DIFFERENTIAL   2:56 PM Pt reporting he is not using his insulin due to being depressed Denies SI/HI Will treat hyperglycemia and reassess  4:26 PM Plan is to rehydrate, treat hyperglycemia (no anion gap) Pt has no new complaints today He admits to noncompliance Feel he can safely discharged once glucose  has improved and if patient is taking PO D/w dr Clarene Duke to recheck patient after insulin/fluids  MDM  Nursing notes reviewed and considered in documentation All labs/vitals reviewed and considered Previous records reviewed and considered        Date: 12/21/2011  Rate: 94  Rhythm: normal sinus rhythm  QRS Axis: right  Intervals: normal  ST/T Wave abnormalities: nonspecific ST changes  Conduction Disutrbances:right bundle branch block  Narrative Interpretation:  PVC noted  Old EKG Reviewed: changes noted Previous EKG from 2004    Joya Gaskins, MD 12/21/11 1627

## 2011-12-21 NOTE — ED Notes (Signed)
Pt states that he "has no where to go" when I notified him that he will be d/c.  Will notify EDP of this

## 2011-12-21 NOTE — ED Notes (Signed)
Pt was treated on 12/17/11 for same and discharged with Rx for insulin but has not been using it.

## 2011-12-21 NOTE — ED Notes (Signed)
Pt is ambulatory and in no distress.  Pt notifies me that he was "dismissed" from the shelter today and not allowed to come back.  Spoke with charge RN, will notify pt that we are happy to give him a cab voucher and he is welcome to wait in waiting area

## 2011-12-21 NOTE — ED Notes (Signed)
Pt c/o right leg pain and states that "I want to find out what's wrong with my legs because I'm about to fall when I walk".

## 2011-12-22 ENCOUNTER — Emergency Department (HOSPITAL_COMMUNITY): Payer: Medicaid Other

## 2011-12-22 ENCOUNTER — Other Ambulatory Visit: Payer: Self-pay

## 2011-12-22 DIAGNOSIS — I059 Rheumatic mitral valve disease, unspecified: Secondary | ICD-10-CM

## 2011-12-22 LAB — BASIC METABOLIC PANEL
Calcium: 8.9 mg/dL (ref 8.4–10.5)
Creatinine, Ser: 1 mg/dL (ref 0.50–1.35)
GFR calc Af Amer: >90 mL/min (ref >90)

## 2011-12-22 LAB — DIFFERENTIAL
Basophils Absolute: 0 10*3/uL (ref 0.0–0.1)
Basophils Relative: 1 % (ref 0–1)
Eosinophils Absolute: 0.2 10*3/uL (ref 0.0–0.7)
Eosinophils Relative: 4 % (ref 0–5)
Monocytes Absolute: 0.3 10*3/uL (ref 0.1–1.0)

## 2011-12-22 LAB — URINALYSIS, ROUTINE W REFLEX MICROSCOPIC
Bilirubin Urine: NEGATIVE
Ketones, ur: NEGATIVE mg/dL
Leukocytes, UA: NEGATIVE
Nitrite: NEGATIVE
Protein, ur: NEGATIVE mg/dL

## 2011-12-22 LAB — CBC
HCT: 38.8 % — ABNORMAL LOW (ref 39.0–52.0)
MCH: 29.9 pg (ref 26.0–34.0)
MCHC: 33.5 g/dL (ref 30.0–36.0)
MCV: 89.2 fL (ref 78.0–100.0)
MCV: 88.7 fL (ref 78.0–100.0)
Platelets: 172 10*3/uL (ref 150–400)
RDW: 13.3 % (ref 11.5–15.5)
RDW: 13.5 % (ref 11.5–15.5)
WBC: 4.9 10*3/uL (ref 4.0–10.5)

## 2011-12-22 LAB — CARDIAC PANEL(CRET KIN+CKTOT+MB+TROPI)
Relative Index: INVALID (ref 0.0–2.5)
Relative Index: INVALID (ref 0.0–2.5)
Total CK: 83 U/L (ref 7–232)
Troponin I: <0.3 ng/mL (ref <0.30)

## 2011-12-22 LAB — HEMOGLOBIN A1C
Hgb A1c MFr Bld: 15.9 % — ABNORMAL HIGH (ref <5.7)
Mean Plasma Glucose: 410 mg/dL — ABNORMAL HIGH (ref <117)

## 2011-12-22 LAB — HEPATIC FUNCTION PANEL
AST: 23 U/L (ref 0–37)
Alkaline Phosphatase: 164 U/L — ABNORMAL HIGH (ref 39–117)
Bilirubin, Direct: <0.1 mg/dL (ref 0.0–0.3)
Total Bilirubin: 0.3 mg/dL (ref 0.3–1.2)

## 2011-12-22 LAB — GLUCOSE, CAPILLARY
Glucose-Capillary: >600 mg/dL (ref 70–99)
Glucose-Capillary: >600 mg/dL (ref 70–99)
Glucose-Capillary: 238 mg/dL — ABNORMAL HIGH (ref 70–99)
Glucose-Capillary: 444 mg/dL — ABNORMAL HIGH (ref 70–99)

## 2011-12-22 LAB — LIPASE, BLOOD: Lipase: 24 U/L (ref 11–59)

## 2011-12-22 LAB — URINE MICROSCOPIC-ADD ON

## 2011-12-22 LAB — CREATININE, SERUM
Creatinine, Ser: 0.91 mg/dL (ref 0.50–1.35)
GFR calc Af Amer: >90 mL/min (ref >90)

## 2011-12-22 LAB — MAGNESIUM: Magnesium: 1.6 mg/dL (ref 1.5–2.5)

## 2011-12-22 MED ORDER — INSULIN ASPART 100 UNIT/ML ~~LOC~~ SOLN
10.0000 [IU] | Freq: Once | SUBCUTANEOUS | Status: AC
  Administered 2011-12-22: 10 [IU] via SUBCUTANEOUS
  Filled 2011-12-22: qty 1

## 2011-12-22 MED ORDER — SODIUM CHLORIDE 0.9 % IV BOLUS (SEPSIS)
1000.0000 mL | Freq: Once | INTRAVENOUS | Status: AC
  Administered 2011-12-22: 1000 mL via INTRAVENOUS

## 2011-12-22 MED ORDER — INSULIN REGULAR HUMAN 100 UNIT/ML IJ SOLN: 10.0000 [IU] | Freq: Once | INTRAMUSCULAR | Status: DC

## 2011-12-22 MED ORDER — GABAPENTIN 100 MG PO CAPS
100.0000 mg | ORAL_CAPSULE | Freq: Every day | ORAL | Status: DC
  Administered 2011-12-22 – 2011-12-23 (×2): 100 mg via ORAL
  Filled 2011-12-22 (×5): qty 1

## 2011-12-22 MED ORDER — INSULIN ASPART 100 UNIT/ML ~~LOC~~ SOLN
10.0000 [IU] | Freq: Once | SUBCUTANEOUS | Status: AC
  Administered 2011-12-22: 10 [IU] via SUBCUTANEOUS

## 2011-12-22 MED ORDER — LORAZEPAM 2 MG/ML IJ SOLN
1.0000 mg | Freq: Once | INTRAMUSCULAR | Status: DC
  Filled 2011-12-22: qty 1

## 2011-12-22 MED ORDER — ENOXAPARIN SODIUM 40 MG/0.4ML ~~LOC~~ SOLN
40.0000 mg | SUBCUTANEOUS | Status: DC
  Administered 2011-12-22 – 2011-12-24 (×3): 40 mg via SUBCUTANEOUS
  Filled 2011-12-22 (×4): qty 0.4

## 2011-12-22 MED ORDER — INSULIN ASPART PROT & ASPART (70-30 MIX) 100 UNIT/ML ~~LOC~~ SUSP
20.0000 [IU] | Freq: Every day | SUBCUTANEOUS | Status: DC
  Filled 2011-12-22: qty 3

## 2011-12-22 MED ORDER — FLUCONAZOLE 100 MG PO TABS
100.0000 mg | ORAL_TABLET | Freq: Every day | ORAL | Status: DC
  Administered 2011-12-22 – 2011-12-24 (×3): 100 mg via ORAL
  Filled 2011-12-22 (×4): qty 1

## 2011-12-22 MED ORDER — INSULIN ASPART PROT & ASPART (70-30 MIX) 100 UNIT/ML ~~LOC~~ SUSP: 30.0000 [IU] | Freq: Every day | SUBCUTANEOUS | Status: DC

## 2011-12-22 MED ORDER — ADENOSINE 6 MG/2ML IV SOLN
6.0000 mg | Freq: Once | INTRAVENOUS | Status: AC
  Administered 2011-12-22: 6 mg via INTRAVENOUS
  Filled 2011-12-22: qty 2

## 2011-12-22 MED ORDER — INSULIN ASPART 100 UNIT/ML ~~LOC~~ SOLN
5.0000 [IU] | Freq: Once | SUBCUTANEOUS | Status: AC
  Administered 2011-12-22: 5 [IU] via SUBCUTANEOUS
  Filled 2011-12-22: qty 1

## 2011-12-22 MED ORDER — PANTOPRAZOLE SODIUM 20 MG PO TBEC
20.0000 mg | DELAYED_RELEASE_TABLET | Freq: Every day | ORAL | Status: DC
  Administered 2011-12-22 – 2011-12-24 (×3): 20 mg via ORAL
  Filled 2011-12-22 (×4): qty 1

## 2011-12-22 MED ORDER — METOPROLOL TARTRATE 25 MG PO TABS
25.0000 mg | ORAL_TABLET | Freq: Two times a day (BID) | ORAL | Status: DC
  Administered 2011-12-22 – 2011-12-24 (×5): 25 mg via ORAL
  Filled 2011-12-22 (×8): qty 1

## 2011-12-22 MED ORDER — GLIPIZIDE 5 MG PO TABS
5.0000 mg | ORAL_TABLET | Freq: Two times a day (BID) | ORAL | Status: DC
  Administered 2011-12-22 – 2011-12-24 (×5): 5 mg via ORAL
  Filled 2011-12-22 (×7): qty 1

## 2011-12-22 MED ORDER — LISINOPRIL 10 MG PO TABS
10.0000 mg | ORAL_TABLET | Freq: Every day | ORAL | Status: DC
  Administered 2011-12-22 – 2011-12-24 (×3): 10 mg via ORAL
  Filled 2011-12-22 (×4): qty 1

## 2011-12-22 MED ORDER — INSULIN ASPART 100 UNIT/ML ~~LOC~~ SOLN
6.0000 [IU] | Freq: Three times a day (TID) | SUBCUTANEOUS | Status: DC
  Administered 2011-12-22 – 2011-12-24 (×8): 6 [IU] via SUBCUTANEOUS
  Filled 2011-12-22: qty 3
  Filled 2011-12-22: qty 1

## 2011-12-22 MED ORDER — ADENOSINE 6 MG/2ML IV SOLN: 6.0000 mg | Freq: Once | INTRAVENOUS | Status: DC

## 2011-12-22 MED ORDER — SODIUM CHLORIDE 0.45 % IV SOLN
INTRAVENOUS | Status: DC
Start: 2011-12-22 — End: 2011-12-24
  Administered 2011-12-22: 100 mL via INTRAVENOUS
  Administered 2011-12-23 (×2): via INTRAVENOUS

## 2011-12-22 MED ORDER — INSULIN ASPART 100 UNIT/ML ~~LOC~~ SOLN
0.0000 [IU] | Freq: Three times a day (TID) | SUBCUTANEOUS | Status: DC
Start: 2011-12-23 — End: 2011-12-24
  Administered 2011-12-22: 10 [IU] via SUBCUTANEOUS
  Administered 2011-12-23: 8 [IU] via SUBCUTANEOUS
  Administered 2011-12-23: 5 [IU] via SUBCUTANEOUS
  Administered 2011-12-23: 8 [IU] via SUBCUTANEOUS
  Administered 2011-12-24: 15 [IU] via SUBCUTANEOUS
  Administered 2011-12-24: 11 [IU] via SUBCUTANEOUS
  Administered 2011-12-24: 15 [IU] via SUBCUTANEOUS

## 2011-12-22 MED ORDER — INSULIN ASPART 100 UNIT/ML ~~LOC~~ SOLN
0.0000 [IU] | Freq: Every day | SUBCUTANEOUS | Status: DC
  Administered 2011-12-22: 4 [IU] via SUBCUTANEOUS
  Administered 2011-12-23: 5 [IU] via SUBCUTANEOUS

## 2011-12-22 NOTE — ED Notes (Signed)
Pt ate 100% on breakfast tray.

## 2011-12-22 NOTE — ED Notes (Signed)
Report received from previous day RN. Pt moved to TCU room 28, First contact with patient. Pt is alert, oriented. Requesting for breakfast, breakfast ordered. Denied further needs. Will continue to monitor.

## 2011-12-22 NOTE — H&P (Signed)
PCP:  No primary provider on file. Chief Complaint:  "My legs are wobbly" for the past 2 weeks.  HPI:  Patient is a 61 year old African American male with history of diabetes mellitus, hypertension, lives in a shelter, recently moved from [place] to [place] in the past 4-6 months and has not been taking his medication regularly. Patient presented to the emergency room because his legs having shaky in the past 2 weeks. He denied any history of trauma. He denied any history of fall. Patient came he was almost about to fall and subsequently brought to the emergency room to be evaluated. He ever denied any history of headaches. Occasional precordial discomfort. Denies any palpitation. No nausea or vomiting. He denies any fever chills or Rigors. He denied any dysuria or hematuria. Since patient relocated to [facility] from [place] he claimed has been out of his medication. In the ED, when patient was evaluated he was found to be dehydrated with a blood sugar level of more than 500 mg percent. Also the patient was found to be in SVT and subsequently given adenosine. After evaluation, patient was advised to be admitted for stabilization  Review of Systems:  The patient denies anorexia, fever, weight loss,, vision loss, decreased hearing, hoarseness, chest pain, syncope, dyspnea on exertion, peripheral edema, balance deficits, hemoptysis, abdominal pain, melena, hematochezia, severe indigestion/heartburn, hematuria, incontinence, genital sores, muscle weakness, suspicious skin lesions, transient blindness, difficulty walking++, depression, unusual weight change, abnormal bleeding, enlarged lymph nodes, angioedema, and breast masses.  Past Medical History:  Past Medical History  Diagnosis Date  . Hypertension   . Diabetes mellitus   . Homelessness   . Periodontal disease   . Noncompliance with medications   . PTSD (post-traumatic stress disorder)   . Alcohol abuse,  unspecified Irregular heartbeat      History reviewed. No pertinent past surgical history.  Medications:  Prior to Admission medications   Medication Sig Start Date End Date Taking? Authorizing Provider  glipiZIDE (GLUCOTROL) 5 MG tablet Take 1 tablet (5 mg total) by mouth 2 (two) times daily before a meal. 12/15/11  Yes Richarda Overlie, MD  insulin aspart (NOVOLOG) 100 UNIT/ML injection Inject 6 Units into the skin 3 (three) times daily before meals. 12/15/11  Yes Richarda Overlie, MD  insulin aspart protamine-insulin aspart (NOVOLOG 70/30) (70-30) 100 UNIT/ML injection Inject 20 Units into the skin daily with supper. 12/15/11 12/14/12 Yes Richarda Overlie, MD  insulin aspart protamine-insulin aspart (NOVOLOG 70/30) (70-30) 100 UNIT/ML injection Inject 30 Units into the skin daily with breakfast. 12/15/11 12/14/12 Yes Richarda Overlie, MD  lisinopril (PRINIVIL,ZESTRIL) 10 MG tablet Take 10 mg by mouth daily.    Yes Historical Provider, MD    Allergies:  No Known Allergies  Social History:   reports that he has never smoked. He does not have any smokeless tobacco history on file. He reports that he drinks alcohol. He reports that he does not use illicit drugs.  Family History:  History reviewed. No pertinent family history.  Physical Exam:  Filed Vitals:   12/22/11 0520 12/22/11 0546 12/22/11 0549 12/22/11 0550  BP: 115/84 112/79 130/90   Pulse: 145 143 141 96  Temp: 97.9 F (36.6 C)     TempSrc: Oral     Resp: 18  29 18   SpO2: 99% 97% 99% 99%      General: Alert and oriented times three, unkempt, no acute distress, dehydrated, poor dentition  Eyes: PERRLA, pink conjunctiva, scleral icterus  ENT: Dry oral mucosa, neck supple,  no thyromegaly  Lungs: clear to ascultation, no wheeze, no crackles, no use of accessory muscles  Cardiovascular: regular rate and rhythm, no regurgitation, no gallops, no murmurs. No carotid bruits, no JVD  Abdomen: soft, positive BS, non-tender, non-distended, no  organomegaly, not an acute abdomen  GU: not examined  Neuro: Non focal  Musculoskeletal: Arthritic changes in the knees and feet.  Extremity-Trace pedal edema  Skin: Decreased turgor  Psych: appropriate patient  ?  Labs on Admission:   [facility] 12/22/11 0415 12/21/11 1319  NA 131* 127*  K 3.7 4.6  CL 99 89*  CO2 22 25  GLUCOSE 563* 784*  BUN 18 25*  CREATININE 1.00 1.03  CALCIUM 8.9 10.0  MG -- --  PHOS -- --     Basename 12/22/11 0415  AST 23  ALT 41  ALKPHOS 164*  BILITOT 0.3  PROT 6.2  ALBUMIN 2.8*     Basename 12/22/11 0415  LIPASE 24  AMYLASE --     Basename 12/22/11 0415 12/21/11 1319  WBC 5.6 4.4  NEUTROABS 3.0 2.9  HGB 13.0 12.8*  HCT 38.8* 37.6*  MCV 89.2 90.2  PLT 192 191    No results found for this basename: CKTOTAL:3,CKMB:3,CKMBINDEX:3,TROPONINI:3 in the last 72 hours  No results found for this basename: TSH,T4TOTAL,FREET3,T3FREE,THYROIDAB in the last 72 hours  No results found for this basename: VITAMINB12:2,FOLATE:2,FERRITIN:2,TIBC:2,IRON:2,RETICCTPCT:2 in the last 72 hours  Radiological Exams on Admission:  Dg Chest 2 View  12/14/2011  *RADIOLOGY REPORT*  Clinical Data: Cough  CHEST - 2 VIEW  Comparison: None.  Findings: Lungs are clear. No pleural effusion or pneumothorax.  Cardiomediastinal silhouette is within normal limits.  Mild degenerative changes of the visualized thoracolumbar spine.  IMPRESSION: No evidence of acute cardiopulmonary disease.  Original Report Authenticated By: Charline Bills, M.D.   Dg Tibia/fibula Right  12/22/2011  *RADIOLOGY REPORT*  Clinical Data: Right lower leg pain.  RIGHT TIBIA AND FIBULA - 2 VIEW  Comparison: None.  Findings: There is no evidence of fracture or dislocation.  The tibia and fibula appear intact.  Visualized joint spaces are preserved.  A small knee joint effusion is noted.  Scattered vascular calcifications are noted.  The ankle mortise is incompletely assessed, but appears grossly  unremarkable.  IMPRESSION:  1.  No evidence of fracture or dislocation. 2.  Small knee joint effusion noted. 3.  Scattered vascular calcifications seen.  Original Report Authenticated By: Tonia Ghent, M.D.    Assessment/Plan  Present on Admission:   Problems: #1 "Wobbly Legs: #2 dehydration #3 uncontrolled blood sugar #4 questionable irregular heartbeat #5 noncompliant with medication #6 poor dentition #7 abnormal LFT.  Assessment: #1 hyperosmolar nonketotic hyperglycemia. #2 dehydration #3 noncompliant with medication #4 peripheral neuropathy #5 hypertension #6 candiduria #7 abnormal LFT #8 dental care/poor hygiene  Plan: #1 admit to general medical floor #2 start IV hydration with half normal saline #3 start insulin regimen as well as oral hypoglycemics #4 treat for candiduria with Diflucan #5 restart home meds Labs-CBC, CMP and magnesium, hemoglobin A1c, hepatitis panel, and 2-D echo Patient will be evaluated on daily basis    Talmage Nap        [phone]

## 2011-12-22 NOTE — Progress Notes (Signed)
  Echocardiogram 2D Echocardiogram has been performed.  Rayetta Humphrey 12/22/2011, 11:03 AM

## 2011-12-22 NOTE — ED Provider Notes (Signed)
History     CSN: [id number]  Arrival date & time 12/21/11  2318   First MD Initiated Contact with Patient 12/22/11 0036      Chief Complaint  Patient presents with  . Leg Pain     HPI  History provided by the patient. Patient is a 61 year old male with history of diabetes, hypertension and alcohol abuse who returns to the emergency room shortly after being discharged with complaints of right lower extremity pain and numbness. Patient is homeless and states that he also has no place to go and states that he wants to stay here for the night. Patient was just seen for similar complaints. His blood sugar was found to be elevated and he was given subcutaneous insulin which improved her sugar to the 300s. Patient complains of intermittent symptoms of his right leg "acting up" on him and giving out. He complains of feeling numbness and tingling in bilateral feet and toes. He denies having any swelling or erythema to the skin. Patient has been ambulatory. He denies any fever, chills, sweats. He denies any recent URI symptoms. He denies any nausea vomiting diarrhea. Patient reports that he does not have any medications for his diabetes. Patient states that he was formally having assistance with his medications to the outpatient clinics in [facility]. Patient has not been seen there in the past 6 months to one year. Patient has no other complaints.    Past Medical History  Diagnosis Date  . Hypertension   . Diabetes mellitus   . Homelessness   . Periodontal disease   . Noncompliance with medications   . PTSD (post-traumatic stress disorder)   . Alcohol abuse, unspecified     History reviewed. No pertinent past surgical history.  History reviewed. No pertinent family history.  History  Substance Use Topics  . Smoking status: Never Smoker   . Smokeless tobacco: Not on file  . Alcohol Use: Yes     occasional, states he doesn't drink much but doesn't quanitfy        Review of Systems  Constitutional: Negative for fever and chills.  Respiratory: Negative for cough and shortness of breath.   Cardiovascular: Negative for chest pain.  Gastrointestinal: Negative for nausea, vomiting, abdominal pain, diarrhea and constipation.  Neurological: Positive for numbness. Negative for weakness.  All other systems reviewed and are negative.    Allergies  Review of patient's allergies indicates no known allergies.  Home Medications   Current Outpatient Rx  Name Route Sig Dispense Refill  . GLIPIZIDE 5 MG PO TABS Oral Take 1 tablet (5 mg total) by mouth 2 (two) times daily before a meal. 30 tablet 0  . INSULIN ASPART 100 UNIT/ML [place] SOLN Subcutaneous Inject 6 Units into the skin 3 (three) times daily before meals. 1 vial 100  . INSULIN ASPART PROT & ASPART (70-30) 100 UNIT/ML [place] SUSP Subcutaneous Inject 20 Units into the skin daily with supper. 1000 mL 10  . INSULIN ASPART PROT & ASPART (70-30) 100 UNIT/ML [place] SUSP Subcutaneous Inject 30 Units into the skin daily with breakfast. 1000 mL 10  . LISINOPRIL 10 MG PO TABS Oral Take 10 mg by mouth daily.       BP 156/101  Pulse 96  Temp(Src) 98.7 F (37.1 C) (Oral)  Resp 20  SpO2 99%  Physical Exam  Nursing note and vitals reviewed. Constitutional: He is oriented to person, place, and time. He appears well-developed and well-nourished.  HENT:  Head: Normocephalic and  atraumatic.  Cardiovascular: Normal rate and regular rhythm.   Pulmonary/Chest: Effort normal and breath sounds normal. No respiratory distress. He has no wheezes. He has no rales.  Abdominal: Soft. Bowel sounds are normal. He exhibits no distension.  Musculoskeletal: Normal range of motion. He exhibits no edema and no tenderness.       Patient with normal gait and normal strength in bilateral lower legs. He has normal pedal pulses. Patient reports slight decreased sensation in feet with tingling sensation. Patient has no signs for skin  infection.  Neurological: He is alert and oriented to person, place, and time.  Skin: Skin is warm. No erythema.  Psychiatric: He has a normal mood and affect. His behavior is normal.    ED Course  Procedures   Labs Reviewed  GLUCOSE, CAPILLARY - Abnormal; Notable for the following:    Glucose-Capillary >600 (*)    All other components within normal limits  GLUCOSE, CAPILLARY - Abnormal; Notable for the following:    Glucose-Capillary >600 (*)    All other components within normal limits   Results for orders placed during the hospital encounter of 12/21/11  GLUCOSE, CAPILLARY      Component Value Range   Glucose-Capillary >600 (*) 70 - 99 (mg/dL)  GLUCOSE, CAPILLARY      Component Value Range   Glucose-Capillary >600 (*) 70 - 99 (mg/dL)  GLUCOSE, CAPILLARY      Component Value Range   Glucose-Capillary >600 (*) 70 - 99 (mg/dL)   Comment 1 Repeat Test     Comment 2 Notify RN    GLUCOSE, CAPILLARY      Component Value Range   Glucose-Capillary >600 (*) 70 - 99 (mg/dL)   Comment 1 Repeat Test     Comment 2 Notify RN    GLUCOSE, CAPILLARY      Component Value Range   Glucose-Capillary >600 (*) 70 - 99 (mg/dL)   Comment 1 Documented in Chart     Comment 2 Notify RN    GLUCOSE, CAPILLARY      Component Value Range   Glucose-Capillary >600 (*) 70 - 99 (mg/dL)   Comment 1 Documented in Chart     Comment 2 Notify RN    CBC      Component Value Range   WBC 5.6  4.0 - 10.5 (K/uL)   RBC 4.35  4.22 - 5.81 (MIL/uL)   Hemoglobin 13.0  13.0 - 17.0 (g/dL)   HCT 40.9 (*) 81.1 - 52.0 (%)   MCV 89.2  78.0 - 100.0 (fL)   MCH 29.9  26.0 - 34.0 (pg)   MCHC 33.5  30.0 - 36.0 (g/dL)   RDW 91.4  78.2 - 95.6 (%)   Platelets 192  150 - 400 (K/uL)  DIFFERENTIAL      Component Value Range   Neutrophils Relative 54  43 - 77 (%)   Neutro Abs 3.0  1.7 - 7.7 (K/uL)   Lymphocytes Relative 36  12 - 46 (%)   Lymphs Abs 2.0  0.7 - 4.0 (K/uL)   Monocytes Relative 6  3 - 12 (%)   Monocytes  Absolute 0.3  0.1 - 1.0 (K/uL)   Eosinophils Relative 4  0 - 5 (%)   Eosinophils Absolute 0.2  0.0 - 0.7 (K/uL)   Basophils Relative 1  0 - 1 (%)   Basophils Absolute 0.0  0.0 - 0.1 (K/uL)  BASIC METABOLIC PANEL      Component Value Range   Sodium 131 (*)  135 - 145 (mEq/L)   Potassium 3.7  3.5 - 5.1 (mEq/L)   Chloride 99  96 - 112 (mEq/L)   CO2 22  19 - 32 (mEq/L)   Glucose, Bld 563 (*) 70 - 99 (mg/dL)   BUN 18  6 - 23 (mg/dL)   Creatinine, Ser 1.61  0.50 - 1.35 (mg/dL)   Calcium 8.9  8.4 - 09.6 (mg/dL)   GFR calc non Af Amer 81 (*) >90 (mL/min)   GFR calc Af Amer >90  >90 (mL/min)  GLUCOSE, CAPILLARY      Component Value Range   Glucose-Capillary 425 (*) 70 - 99 (mg/dL)     No results found.   No diagnosis found.    MDM  12:45AM patient seen and evaluated. Patient in no acute distress. Patient was seen and discharged shortly with for checking back in.  5:30AM Nurse tech informed me pt's HR has increased to 145.  Patient denies any discomfort. No chest pain or shortness of breath no lightheadedness. Patient resting comfortably otherwise. Will obtain ECG.  ECG demonstrates wide complex tachycardia. Patient discussed with attending physician. Will give 6 mg adenosine   At this time will plan for admission. Patient discussed in sign out with Heather VanWingin PAC.  She will call for admission       Date: 12/22/2011  Rate: 144  Rhythm: ventricular tachycardia  QRS Axis: normal  Intervals: normal  ST/T Wave abnormalities: nonspecific ST/T changes  Conduction Disutrbances:right bundle branch block  Narrative Interpretation:   Old EKG Reviewed: changes noted from 12/21/11 Tachycardia new.        Angus Seller, [place] 12/22/11 [phone]

## 2011-12-22 NOTE — ED Provider Notes (Addendum)
61 year old male had been in the emergency department yesterday for hyperglycemia, left after blood sugar was brought down and returned a few hours later with a blood sugar very high once again. While in the emergency department, he developed an episode of paroxysmal supraventricular tachycardia which responded well to a single injection of adenosine 6 mg. It was elected to try to admit the patient for blood sugar control given the fact that he apparently is homeless and clearly showed an inability to maintain his blood sugar at a safe range after it had been lowered.  [name], MD 12/22/11 [phone]  Case has been discussed with Dr. Wynelle Bourgeois who agrees to admit him to 23 hour observation.  [name], MD 12/22/11 [phone]  CRITICAL CARE Performed by: [email or web address]   Total critical care time: 35 minutes  Critical care time was exclusive of separately billable procedures and treating other patients.  Critical care was necessary to treat or prevent imminent or life-threatening deterioration.  Critical care was time spent personally by me on the following activities: development of treatment plan with patient and/or surrogate as well as nursing, discussions with consultants, evaluation of patient's response to treatment, examination of patient, obtaining history from patient or surrogate, ordering and performing treatments and interventions, ordering and review of laboratory studies, ordering and review of radiographic studies, pulse oximetry and re-evaluation of patient's condition.    [name], MD 12/22/11 [phone]

## 2011-12-22 NOTE — ED Notes (Signed)
Pt medicated with noon medicine

## 2011-12-22 NOTE — ED Provider Notes (Signed)
6:15 AM Patient signed out to me by Ivonne Andrew, PA-C.   Patient will uncontrolled DM.  He was seen in the ED yesterday with initial blood sugar in the 700's and discharged home with blood sugar 336.  He returned to the ED a couple hours later with a blood sugar greater than 600.  He has been given a total of 15 Units of Insulin overnight and IVF.  His most recent blood sugar is 313.  He does not have an anion gap.  Plan is to admit patient for hyperglycemia.  6:45 AM He continues to have some pain in his right lower leg, but reports the pain has improved.  Denies nausea, vomiting, abdominal pain.  Patient not having any other symptoms at this time.  Pascal Lux [facility] 12/22/11 1015

## 2011-12-22 NOTE — ED Provider Notes (Signed)
Medical screening examination/treatment/procedure(s) were conducted as a shared visit with non-physician practitioner(s) and myself.  I personally evaluated the patient during the encounter   [name], MD 12/22/11 [phone]

## 2011-12-22 NOTE — ED Notes (Signed)
Echo tech in room 

## 2011-12-23 LAB — CBC
HCT: 33.3 % — ABNORMAL LOW (ref 39.0–52.0)
MCHC: 33.9 g/dL (ref 30.0–36.0)
MCV: 87.4 fL (ref 78.0–100.0)
Platelets: 171 10*3/uL (ref 150–400)
RDW: 13.3 % (ref 11.5–15.5)
WBC: 4.1 10*3/uL (ref 4.0–10.5)

## 2011-12-23 LAB — GLUCOSE, CAPILLARY
Glucose-Capillary: >600 mg/dL (ref 70–99)
Glucose-Capillary: >600 mg/dL (ref 70–99)
Glucose-Capillary: 299 mg/dL — ABNORMAL HIGH (ref 70–99)
Glucose-Capillary: 291 mg/dL — ABNORMAL HIGH (ref 70–99)
Glucose-Capillary: 216 mg/dL — ABNORMAL HIGH (ref 70–99)
Glucose-Capillary: 370 mg/dL — ABNORMAL HIGH (ref 70–99)

## 2011-12-23 LAB — DIFFERENTIAL
Basophils Absolute: 0 10*3/uL (ref 0.0–0.1)
Basophils Relative: 1 % (ref 0–1)
Eosinophils Relative: 5 % (ref 0–5)
Monocytes Absolute: 0.3 10*3/uL (ref 0.1–1.0)
Neutro Abs: 1.9 10*3/uL (ref 1.7–7.7)

## 2011-12-23 LAB — COMPREHENSIVE METABOLIC PANEL
ALT: 36 U/L (ref 0–53)
AST: 22 U/L (ref 0–37)
Albumin: 2.4 g/dL — ABNORMAL LOW (ref 3.5–5.2)
Calcium: 8.2 mg/dL — ABNORMAL LOW (ref 8.4–10.5)
Creatinine, Ser: 0.95 mg/dL (ref 0.50–1.35)
Sodium: 131 mEq/L — ABNORMAL LOW (ref 135–145)

## 2011-12-23 LAB — HEPATITIS PANEL, ACUTE
Hep A IgM: NEGATIVE
Hep B C IgM: NEGATIVE
Hepatitis B Surface Ag: NEGATIVE

## 2011-12-23 LAB — CARDIAC PANEL(CRET KIN+CKTOT+MB+TROPI): CK, MB: 3.9 ng/mL (ref 0.3–4.0)

## 2011-12-23 MED ORDER — INSULIN GLARGINE 100 UNIT/ML ~~LOC~~ SOLN
20.0000 [IU] | Freq: Two times a day (BID) | SUBCUTANEOUS | Status: DC
  Administered 2011-12-23 – 2011-12-24 (×2): 20 [IU] via SUBCUTANEOUS
  Filled 2011-12-23: qty 3

## 2011-12-23 NOTE — Progress Notes (Signed)
Patient ID: Clayton [name], male   DOB: [birth year], 61 y.o.   MRN: [id number] Subjective: Patient seen. Feels better. No complaints  Objective: Weight change:   Intake/Output Summary (Last 24 hours) at 12/23/11 1805 Last data filed at 12/23/11 1608  Gross per 24 hour  Intake   2915 ml  Output   3451 ml  Net   -536 ml   BP 113/66  Pulse 71  Temp(Src) 98 F (36.7 C) (Oral)  Resp 20  Ht 6\' 1"  (1.854 m)  Wt 79.4 kg (175 lb 0.7 oz)  BMI 23.09 kg/m2  SpO2 96% Physical Exam: General appearance: alert, cooperative and no distress,mild pallor Head: Normocephalic, without obvious abnormality, atraumatic Neck: no adenopathy, no carotid bruit, no JVD, supple, symmetrical, trachea midline and thyroid not enlarged, symmetric, no tenderness/mass/nodules Lungs: clear to auscultation bilaterally Heart: regular rate and rhythm, S1, S2 normal, no murmur, click, rub or gallop Abdomen: soft, non-tender; bowel sounds normal; no masses,  no organomegaly Extremities: extremities normal, atraumatic, no cyanosis or edema Skin: Improved turgor  Lab Results: Results for orders placed during the hospital encounter of 12/21/11 (from the past 48 hour(s))  GLUCOSE, CAPILLARY     Status: Abnormal   Collection Time   12/22/11 12:59 AM      Component Value Range Comment   Glucose-Capillary >600 (*) 70 - 99 (mg/dL) REPEATED TO VERIFY, CHARGE CREDITED  GLUCOSE, CAPILLARY     Status: Abnormal   Collection Time   12/22/11  1:03 AM      Component Value Range Comment   Glucose-Capillary >600 (*) 70 - 99 (mg/dL)   GLUCOSE, CAPILLARY     Status: Abnormal   Collection Time   12/22/11  2:53 AM      Component Value Range Comment   Glucose-Capillary >600 (*) 70 - 99 (mg/dL) REPEATED TO VERIFY, CHARGE CREDITED   Comment 1 Repeat Test      Comment 2 Notify RN     GLUCOSE, CAPILLARY     Status: Abnormal   Collection Time   12/22/11  2:53 AM      Component Value Range Comment   Glucose-Capillary >600 (*) 70 - 99  (mg/dL)    Comment 1 Repeat Test      Comment 2 Notify RN     GLUCOSE, CAPILLARY     Status: Abnormal   Collection Time   12/22/11  2:57 AM      Component Value Range Comment   Glucose-Capillary >600 (*) 70 - 99 (mg/dL) REPEATED TO VERIFY, CHARGE CREDITED   Comment 1 Documented in Chart      Comment 2 Notify RN     GLUCOSE, CAPILLARY     Status: Abnormal   Collection Time   12/22/11  2:57 AM      Component Value Range Comment   Glucose-Capillary >600 (*) 70 - 99 (mg/dL)    Comment 1 Documented in Chart      Comment 2 Notify RN     CBC     Status: Abnormal   Collection Time   12/22/11  4:15 AM      Component Value Range Comment   WBC 5.6  4.0 - 10.5 (K/uL)    RBC 4.35  4.22 - 5.81 (MIL/uL)    Hemoglobin 13.0  13.0 - 17.0 (g/dL)    HCT 32.4 (*) 40.1 - 52.0 (%)    MCV 89.2  78.0 - 100.0 (fL)    MCH 29.9  26.0 - 34.0 (pg)  MCHC 33.5  30.0 - 36.0 (g/dL)    RDW 16.1  09.6 - 04.5 (%)    Platelets 192  150 - 400 (K/uL)   DIFFERENTIAL     Status: Normal   Collection Time   12/22/11  4:15 AM      Component Value Range Comment   Neutrophils Relative 54  43 - 77 (%)    Neutro Abs 3.0  1.7 - 7.7 (K/uL)    Lymphocytes Relative 36  12 - 46 (%)    Lymphs Abs 2.0  0.7 - 4.0 (K/uL)    Monocytes Relative 6  3 - 12 (%)    Monocytes Absolute 0.3  0.1 - 1.0 (K/uL)    Eosinophils Relative 4  0 - 5 (%)    Eosinophils Absolute 0.2  0.0 - 0.7 (K/uL)    Basophils Relative 1  0 - 1 (%)    Basophils Absolute 0.0  0.0 - 0.1 (K/uL)   BASIC METABOLIC PANEL     Status: Abnormal   Collection Time   12/22/11  4:15 AM      Component Value Range Comment   Sodium 131 (*) 135 - 145 (mEq/L)    Potassium 3.7  3.5 - 5.1 (mEq/L) DELTA CHECK NOTED   Chloride 99  96 - 112 (mEq/L)    CO2 22  19 - 32 (mEq/L)    Glucose, Bld 563 (*) 70 - 99 (mg/dL)    BUN 18  6 - 23 (mg/dL)    Creatinine, Ser 4.09  0.50 - 1.35 (mg/dL)    Calcium 8.9  8.4 - 10.5 (mg/dL)    GFR calc non Af Amer 81 (*) >90 (mL/min)    GFR calc Af  Amer >90  >90 (mL/min)   HEPATIC FUNCTION PANEL     Status: Abnormal   Collection Time   12/22/11  4:15 AM      Component Value Range Comment   Total Protein 6.2  6.0 - 8.3 (g/dL)    Albumin 2.8 (*) 3.5 - 5.2 (g/dL)    AST 23  0 - 37 (U/L)    ALT 41  0 - 53 (U/L)    Alkaline Phosphatase 164 (*) 39 - 117 (U/L)    Total Bilirubin 0.3  0.3 - 1.2 (mg/dL)    Bilirubin, Direct <8.1  0.0 - 0.3 (mg/dL)    Indirect Bilirubin NOT CALCULATED  0.3 - 0.9 (mg/dL)   LIPASE, BLOOD     Status: Normal   Collection Time   12/22/11  4:15 AM      Component Value Range Comment   Lipase 24  11 - 59 (U/L)   GLUCOSE, CAPILLARY     Status: Abnormal   Collection Time   12/22/11  4:51 AM      Component Value Range Comment   Glucose-Capillary 425 (*) 70 - 99 (mg/dL)   URINALYSIS, ROUTINE W REFLEX MICROSCOPIC     Status: Abnormal   Collection Time   12/22/11  5:17 AM      Component Value Range Comment   Color, Urine YELLOW  YELLOW     APPearance CLEAR  CLEAR     Specific Gravity, Urine 1.025  1.005 - 1.030     pH 6.0  5.0 - 8.0     Glucose, UA >1000 (*) NEGATIVE (mg/dL)    Hgb urine dipstick NEGATIVE  NEGATIVE     Bilirubin Urine NEGATIVE  NEGATIVE     Ketones, ur NEGATIVE  NEGATIVE (mg/dL)  Protein, ur NEGATIVE  NEGATIVE (mg/dL)    Urobilinogen, UA 0.2  0.0 - 1.0 (mg/dL)    Nitrite NEGATIVE  NEGATIVE     Leukocytes, UA NEGATIVE  NEGATIVE    URINE MICROSCOPIC-ADD ON     Status: Normal   Collection Time   12/22/11  5:17 AM      Component Value Range Comment   Squamous Epithelial / LPF RARE  RARE     Urine-Other FEW YEAST     GLUCOSE, CAPILLARY     Status: Abnormal   Collection Time   12/22/11  6:13 AM      Component Value Range Comment   Glucose-Capillary 313 (*) 70 - 99 (mg/dL)    Comment 1 Documented in Chart      Comment 2 Notify RN     GLUCOSE, CAPILLARY     Status: Abnormal   Collection Time   12/22/11  7:50 AM      Component Value Range Comment   Glucose-Capillary 219 (*) 70 - 99 (mg/dL)     GLUCOSE, CAPILLARY     Status: Abnormal   Collection Time   12/22/11  8:46 AM      Component Value Range Comment   Glucose-Capillary 238 (*) 70 - 99 (mg/dL)   HEMOGLOBIN U9W     Status: Abnormal   Collection Time   12/22/11 11:59 AM      Component Value Range Comment   Hemoglobin A1C 15.9 (*) <5.7 (%)    Mean Plasma Glucose 410 (*) <117 (mg/dL)   MAGNESIUM     Status: Normal   Collection Time   12/22/11 11:59 AM      Component Value Range Comment   Magnesium 1.6  1.5 - 2.5 (mg/dL)   CBC     Status: Abnormal   Collection Time   12/22/11 11:59 AM      Component Value Range Comment   WBC 4.9  4.0 - 10.5 (K/uL)    RBC 4.07 (*) 4.22 - 5.81 (MIL/uL)    Hemoglobin 12.1 (*) 13.0 - 17.0 (g/dL)    HCT 11.9 (*) 14.7 - 52.0 (%)    MCV 88.7  78.0 - 100.0 (fL)    MCH 29.7  26.0 - 34.0 (pg)    MCHC 33.5  30.0 - 36.0 (g/dL)    RDW 82.9  56.2 - 13.0 (%)    Platelets 172  150 - 400 (K/uL)   CREATININE, SERUM     Status: Normal   Collection Time   12/22/11 11:59 AM      Component Value Range Comment   Creatinine, Ser 0.91  0.50 - 1.35 (mg/dL)    GFR calc non Af Amer >90  >90 (mL/min)    GFR calc Af Amer >90  >90 (mL/min)   CARDIAC PANEL(CRET KIN+CKTOT+MB+TROPI)     Status: Abnormal   Collection Time   12/22/11 12:13 PM      Component Value Range Comment   Total CK 84  7 - 232 (U/L)    CK, MB 4.5 (*) 0.3 - 4.0 (ng/mL)    Troponin I <0.30  <0.30 (ng/mL)    Relative Index RELATIVE INDEX IS INVALID  0.0 - 2.5    GLUCOSE, CAPILLARY     Status: Abnormal   Collection Time   12/22/11  1:15 PM      Component Value Range Comment   Glucose-Capillary 444 (*) 70 - 99 (mg/dL)   GLUCOSE, CAPILLARY     Status: Abnormal   Collection  Time   12/22/11  4:20 PM      Component Value Range Comment   Glucose-Capillary 405 (*) 70 - 99 (mg/dL)    Comment 1 Notify RN     CARDIAC PANEL(CRET KIN+CKTOT+MB+TROPI)     Status: Abnormal   Collection Time   12/22/11  7:50 PM      Component Value Range Comment   Total  CK 83  7 - 232 (U/L)    CK, MB 4.7 (*) 0.3 - 4.0 (ng/mL)    Troponin I <0.30  <0.30 (ng/mL)    Relative Index RELATIVE INDEX IS INVALID  0.0 - 2.5    GLUCOSE, CAPILLARY     Status: Abnormal   Collection Time   12/22/11 10:01 PM      Component Value Range Comment   Glucose-Capillary 348 (*) 70 - 99 (mg/dL)   CARDIAC PANEL(CRET KIN+CKTOT+MB+TROPI)     Status: Normal   Collection Time   12/23/11  4:00 AM      Component Value Range Comment   Total CK 68  7 - 232 (U/L)    CK, MB 3.9  0.3 - 4.0 (ng/mL)    Troponin I <0.30  <0.30 (ng/mL)    Relative Index RELATIVE INDEX IS INVALID  0.0 - 2.5    HEPATITIS PANEL, ACUTE     Status: Normal   Collection Time   12/23/11  4:21 AM      Component Value Range Comment   Hepatitis B Surface Ag NEGATIVE  NEGATIVE     HCV Ab NEGATIVE  NEGATIVE     Hep A IgM NEGATIVE  NEGATIVE     Hep B C IgM NEGATIVE  NEGATIVE    CBC     Status: Abnormal   Collection Time   12/23/11  4:21 AM      Component Value Range Comment   WBC 4.1  4.0 - 10.5 (K/uL)    RBC 3.81 (*) 4.22 - 5.81 (MIL/uL)    Hemoglobin 11.3 (*) 13.0 - 17.0 (g/dL)    HCT 47.8 (*) 29.5 - 52.0 (%)    MCV 87.4  78.0 - 100.0 (fL)    MCH 29.7  26.0 - 34.0 (pg)    MCHC 33.9  30.0 - 36.0 (g/dL)    RDW 62.1  30.8 - 65.7 (%)    Platelets 171  150 - 400 (K/uL)   DIFFERENTIAL     Status: Normal   Collection Time   12/23/11  4:21 AM      Component Value Range Comment   Neutrophils Relative 46  43 - 77 (%)    Neutro Abs 1.9  1.7 - 7.7 (K/uL)    Lymphocytes Relative 42  12 - 46 (%)    Lymphs Abs 1.7  0.7 - 4.0 (K/uL)    Monocytes Relative 8  3 - 12 (%)    Monocytes Absolute 0.3  0.1 - 1.0 (K/uL)    Eosinophils Relative 5  0 - 5 (%)    Eosinophils Absolute 0.2  0.0 - 0.7 (K/uL)    Basophils Relative 1  0 - 1 (%)    Basophils Absolute 0.0  0.0 - 0.1 (K/uL)   COMPREHENSIVE METABOLIC PANEL     Status: Abnormal   Collection Time   12/23/11  4:21 AM      Component Value Range Comment   Sodium 131 (*) 135 -  145 (mEq/L)    Potassium 3.6  3.5 - 5.1 (mEq/L)    Chloride 100  96 - 112 (mEq/L)    CO2 23  19 - 32 (mEq/L)    Glucose, Bld 269 (*) 70 - 99 (mg/dL)    BUN 15  6 - 23 (mg/dL)    Creatinine, Ser 1.61  0.50 - 1.35 (mg/dL)    Calcium 8.2 (*) 8.4 - 10.5 (mg/dL)    Total Protein 5.1 (*) 6.0 - 8.3 (g/dL)    Albumin 2.4 (*) 3.5 - 5.2 (g/dL)    AST 22  0 - 37 (U/L)    ALT 36  0 - 53 (U/L)    Alkaline Phosphatase 114  39 - 117 (U/L)    Total Bilirubin 0.1 (*) 0.3 - 1.2 (mg/dL)    GFR calc non Af Amer >90  >90 (mL/min)    GFR calc Af Amer >90  >90 (mL/min)   GLUCOSE, CAPILLARY     Status: Abnormal   Collection Time   12/23/11  8:18 AM      Component Value Range Comment   Glucose-Capillary 299 (*) 70 - 99 (mg/dL)   GLUCOSE, CAPILLARY     Status: Abnormal   Collection Time   12/23/11 12:22 PM      Component Value Range Comment   Glucose-Capillary 291 (*) 70 - 99 (mg/dL)    Comment 1 Notify RN     GLUCOSE, CAPILLARY     Status: Abnormal   Collection Time   12/23/11  4:50 PM      Component Value Range Comment   Glucose-Capillary 216 (*) 70 - 99 (mg/dL)    Comment 1 Notify RN       Micro Results: No results found for this or any previous visit (from the past 240 hour(s)).  Studies/Results: Dg Chest 2 View  12/14/2011  *RADIOLOGY REPORT*  Clinical Data: Cough  CHEST - 2 VIEW  Comparison: None.  Findings: Lungs are clear. No pleural effusion or pneumothorax.  Cardiomediastinal silhouette is within normal limits.  Mild degenerative changes of the visualized thoracolumbar spine.  IMPRESSION: No evidence of acute cardiopulmonary disease.  Original Report Authenticated By: Charline Bills, M.D.   Dg Tibia/fibula Right  12/22/2011  *RADIOLOGY REPORT*  Clinical Data: Right lower leg pain.  RIGHT TIBIA AND FIBULA - 2 VIEW  Comparison: None.  Findings: There is no evidence of fracture or dislocation.  The tibia and fibula appear intact.  Visualized joint spaces are preserved.  A small knee joint  effusion is noted.  Scattered vascular calcifications are noted.  The ankle mortise is incompletely assessed, but appears grossly unremarkable.  IMPRESSION:  1.  No evidence of fracture or dislocation. 2.  Small knee joint effusion noted. 3.  Scattered vascular calcifications seen.  Original Report Authenticated By: Tonia Ghent, M.D.   Medications: Scheduled Meds:   . enoxaparin  40 mg Subcutaneous Q24H  . fluconazole  100 mg Oral Daily  . gabapentin  100 mg Oral QHS  . glipiZIDE  5 mg Oral BID AC  . insulin aspart  0-15 Units Subcutaneous TID WC  . insulin aspart  0-5 Units Subcutaneous QHS  . insulin aspart  6 Units Subcutaneous TID AC  . insulin glargine  20 Units Subcutaneous BID  . lisinopril  10 mg Oral Daily  . metoprolol tartrate  25 mg Oral BID  . pantoprazole  20 mg Oral Q1200   Continuous Infusions:   . sodium chloride 100 mL/hr at 12/23/11 1415   PRN Meds:.  Assessment/Plan: #1 Non ketotic hyperosmolar hyperglycemia-we add Lantus insulin to regimen. #2  candiduria-continue Diflucan #3 hypertension-BP fairly controlled. We'll continue lisinopril. #4 peripheral neuropathy-we'll continue gabapentin #5 noncompliant with medication #6 Poor dental hygiene.   LOS: 2 days   Nicki Gracy 12/23/2011, 6:05 PM

## 2011-12-23 NOTE — Progress Notes (Signed)
Note patient was discharged on 12/15/11 from the hospital.  It appears that case management helped patient obtain insulin prior to discharge to shelter.  However patient returned to the ED on 12/21/11 and 12/22/11 c/o not having insulin and high CBG's.  According to d/c summary from 12/15/11, patient was given Rx. For Novolog 70/30 and Novolog.  Note that both of these medications are branded and therefore expensive. Recommend Relion brand 70/30 which can be purchased at [facility] for 25$ (however unsure if patient can get transportation to [facility] to get this?).     Recommend discontinuation of Novolog meal coverage and restart 70/30 20 units bid while in the hospital.  A1c=15.9% indicating very poorly controlled diabetes.  However he needs stabilization of home life in order to properly care for self and prevent re-admission.  Will order social work consult.  Also it appears that he has been seen in the past or had appt. For f[facility] at [facility].  Need confirmation on when he is to follow-up.  He can also get insulin at [facility] once he is eligible for 10$.

## 2011-12-23 NOTE — ED Provider Notes (Signed)
Medical screening examination/treatment/procedure(s) were performed by non-physician practitioner and as supervising physician I was immediately available for consultation/collaboration.   Dreyton Roessner, MD 12/23/11 0653 

## 2011-12-24 LAB — COMPREHENSIVE METABOLIC PANEL
AST: 20 U/L (ref 0–37)
Albumin: 2.4 g/dL — ABNORMAL LOW (ref 3.5–5.2)
Alkaline Phosphatase: 99 U/L (ref 39–117)
BUN: 16 mg/dL (ref 6–23)
Potassium: 3.7 mEq/L (ref 3.5–5.1)
Total Protein: 5.3 g/dL — ABNORMAL LOW (ref 6.0–8.3)

## 2011-12-24 LAB — GLUCOSE, CAPILLARY: Glucose-Capillary: 361 mg/dL — ABNORMAL HIGH (ref 70–99)

## 2011-12-24 LAB — CBC
MCH: 30.3 pg (ref 26.0–34.0)
MCHC: 34.3 g/dL (ref 30.0–36.0)
Platelets: 163 10*3/uL (ref 150–400)
RDW: 13.5 % (ref 11.5–15.5)

## 2011-12-24 LAB — DIFFERENTIAL
Basophils Absolute: 0 10*3/uL (ref 0.0–0.1)
Basophils Relative: 0 % (ref 0–1)
Eosinophils Absolute: 0.2 10*3/uL (ref 0.0–0.7)
Monocytes Relative: 7 % (ref 3–12)
Neutrophils Relative %: 43 % (ref 43–77)

## 2011-12-24 MED ORDER — INSULIN GLARGINE 100 UNIT/ML ~~LOC~~ SOLN: 20.0000 [IU] | Freq: Every day | SUBCUTANEOUS | Status: DC

## 2011-12-24 MED ORDER — METOPROLOL TARTRATE 25 MG PO TABS: 25.0000 mg | ORAL_TABLET | Freq: Two times a day (BID) | ORAL | Status: DC

## 2011-12-24 MED ORDER — GABAPENTIN 100 MG PO CAPS: 100.0000 mg | ORAL_CAPSULE | Freq: Every day | ORAL | Status: DC

## 2011-12-24 NOTE — Progress Notes (Signed)
Message left for sw about possible d/c of pt later today.  To return call.

## 2011-12-24 NOTE — Discharge Summary (Signed)
DISCHARGE SUMMARY  Clayton [name]  MR#: [id number]  DOB:10/01/1951  Date of Admission: 12/21/2011 Date of Discharge: 12/24/2011  Attending Physician:Wynston Romey  Patient's PCP:No primary provider on file.  Consults:   Discharge Diagnoses: #1 hyperosmolar nonketotic hyperglycemia. #2 dehydration #3 noncompliant with medication #4 peripheral neuropathy #5 hypertension #6 candiduria #7 abnormal LFT-etiology is unknown #8 dental caries/poor hygiene       Current Discharge Medication List    START taking these medications   Details  gabapentin (NEURONTIN) 100 MG capsule Take 1 capsule (100 mg total) by mouth at bedtime. Qty: 30 capsule, Refills: 1    insulin glargine (LANTUS) 100 UNIT/ML injection Inject 20 Units into the skin at bedtime. Qty: 10 mL, Refills: 1    metoprolol tartrate (LOPRESSOR) 25 MG tablet Take 1 tablet (25 mg total) by mouth 2 (two) times daily. Qty: 50 tablet, Refills: 1      CONTINUE these medications which have NOT CHANGED   Details  glipiZIDE (GLUCOTROL) 5 MG tablet Take 1 tablet (5 mg total) by mouth 2 (two) times daily before a meal. Qty: 30 tablet, Refills: 0    insulin aspart (NOVOLOG) 100 UNIT/ML injection Inject 6 Units into the skin 3 (three) times daily before meals. Qty: 1 vial, Refills: 100    !! insulin aspart protamine-insulin aspart (NOVOLOG 70/30) (70-30) 100 UNIT/ML injection Inject 20 Units into the skin daily with supper. Qty: 1000 mL, Refills: 10    !! insulin aspart protamine-insulin aspart (NOVOLOG 70/30) (70-30) 100 UNIT/ML injection Inject 30 Units into the skin daily with breakfast. Qty: 1000 mL, Refills: 10    lisinopril (PRINIVIL,ZESTRIL) 10 MG tablet Take 10 mg by mouth daily.      !! - Potential duplicate medications found. Please discuss with provider.        Hospital Course: Patient is a 61 years old African American male with history of diabetes mellitus, hypertension,lives in a shelter and  recently moved from [place] to [place] in the past 4-6 months and has not been taking his medication regularly, presented to the emergency room with 2 week history of "my legs are wobbly". He denied any history of fall. He denied any history of trauma. He denied any palpitation or chest pain.      In the emergency room, patient was found to be in nonsustained PSVT and this was aborted with IV adenosine and also a markedly elevated blood sugar level greater than 500mg  percent but no ketones. Examination of patient showed patient to be dehydrated. Vital signs are stable. Patient was rigorously rehydrated with normal saline in the ED as well as using the glucose stabilizer to lower blood sugar level.       At the time patient was seen by me, he was dehydrated, blood sugar was less 250 mg percent. The patient was admitted to general medical floor. He was continued on IV normal saline. He was on Accu-Cheks with regular insulin sliding scale, and also restarted on his oral hypoglycemics. In the meantime, hemoglobin A1c was ordered. Since patient's urine showed some yeast, he was started on Diflucan by mouth. He was also given gabapentin for peripheral neuropathy. Blood pressure was maintained with lisinopril as well as metoprolol. Hemoglobin A1c was greater than 11 and subsequently Lantus insulin was added to patient's regimen. DVT prophylaxis was done with Lovenox and GI prophylaxis with Protonix.       Patient's hydration improved markedly and his blood sugar that was controlled with the combination of short and  long-acting insulin in addition to oral hypoglycemics. He was started on diabetic ADA diet and tolerated it without any vomiting.       Patient has remained clinically stable. Examination showed vital signs to be stable with normal skin turgor. Patient was seen by me today, denies any complaints, agreeable to go home to stay with relatives, vital signs stable. Plans is for patient to be discharged  today      Day of Discharge BP 123/76  Pulse 98  Temp(Src) 98.7 F (37.1 C) (Oral)  Resp 20  Ht 6\' 1"  (1.854 m)  Wt 79.4 kg (175 lb 0.7 oz)  BMI 23.09 kg/m2  SpO2 100%  Physical Exam: Vitals as above. HEENT-mild pallor Neck-no jugular distention or lymphadenopathy Chest-clear to auscultation CVS-S1 and S2 Abdomen-soft, nontender, no organomegaly, bowel sounds are positive. Extremity-no pedal edema Skin-no ecchymoses Neuro-nonfocal Neuropsych-appropriate affect.  Results for orders placed during the hospital encounter of 12/21/11 (from the past 24 hour(s))  GLUCOSE, CAPILLARY     Status: Abnormal   Collection Time   12/23/11  9:22 PM      Component Value Range   Glucose-Capillary 370 (*) 70 - 99 (mg/dL)   Comment 1 Notify RN    CBC     Status: Abnormal   Collection Time   12/24/11  5:25 AM      Component Value Range   WBC 4.0  4.0 - 10.5 (K/uL)   RBC 3.96 (*) 4.22 - 5.81 (MIL/uL)   Hemoglobin 12.0 (*) 13.0 - 17.0 (g/dL)   HCT 45.4 (*) 09.8 - 52.0 (%)   MCV 88.4  78.0 - 100.0 (fL)   MCH 30.3  26.0 - 34.0 (pg)   MCHC 34.3  30.0 - 36.0 (g/dL)   RDW 11.9  14.7 - 82.9 (%)   Platelets 163  150 - 400 (K/uL)  DIFFERENTIAL     Status: Normal   Collection Time   12/24/11  5:25 AM      Component Value Range   Neutrophils Relative 43  43 - 77 (%)   Neutro Abs 1.7  1.7 - 7.7 (K/uL)   Lymphocytes Relative 45  12 - 46 (%)   Lymphs Abs 1.8  0.7 - 4.0 (K/uL)   Monocytes Relative 7  3 - 12 (%)   Monocytes Absolute 0.3  0.1 - 1.0 (K/uL)   Eosinophils Relative 5  0 - 5 (%)   Eosinophils Absolute 0.2  0.0 - 0.7 (K/uL)   Basophils Relative 0  0 - 1 (%)   Basophils Absolute 0.0  0.0 - 0.1 (K/uL)  COMPREHENSIVE METABOLIC PANEL     Status: Abnormal   Collection Time   12/24/11  5:25 AM      Component Value Range   Sodium 132 (*) 135 - 145 (mEq/L)   Potassium 3.7  3.5 - 5.1 (mEq/L)   Chloride 100  96 - 112 (mEq/L)   CO2 23  19 - 32 (mEq/L)   Glucose, Bld 349 (*) 70 - 99 (mg/dL)    BUN 16  6 - 23 (mg/dL)   Creatinine, Ser 5.62  0.50 - 1.35 (mg/dL)   Calcium 8.5  8.4 - 13.0 (mg/dL)   Total Protein 5.3 (*) 6.0 - 8.3 (g/dL)   Albumin 2.4 (*) 3.5 - 5.2 (g/dL)   AST 20  0 - 37 (U/L)   ALT 33  0 - 53 (U/L)   Alkaline Phosphatase 99  39 - 117 (U/L)   Total Bilirubin 0.1 (*) 0.3 -  1.2 (mg/dL)   GFR calc non Af Amer 77 (*) >90 (mL/min)   GFR calc Af Amer 90 (*) >90 (mL/min)  GLUCOSE, CAPILLARY     Status: Abnormal   Collection Time   12/24/11  8:00 AM      Component Value Range   Glucose-Capillary 323 (*) 70 - 99 (mg/dL)   Comment 1 Notify RN    GLUCOSE, CAPILLARY     Status: Abnormal   Collection Time   12/24/11 11:51 AM      Component Value Range   Glucose-Capillary 361 (*) 70 - 99 (mg/dL)   Comment 1 Notify RN    GLUCOSE, CAPILLARY     Status: Abnormal   Collection Time   12/24/11  4:45 PM      Component Value Range   Glucose-Capillary 372 (*) 70 - 99 (mg/dL)   Comment 1 Notify RN      Disposition: stable   Follow-up Appts: Discharge Orders    Future Orders Please Complete By Expires   Diet - low sodium heart healthy      Increase activity slowly      Discharge instructions      Comments:   Follow up with pcp 1-2weeks      Signed: Eastyn Dattilo 12/24/2011, 5:14 PM

## 2011-12-24 NOTE — Progress Notes (Signed)
Spoke with sw and case management about pt poss d/c today. The diabetic coor called with concerns about the pt and his hx of noncompliance with his meds.  Informed both the sw and case management about these concerns.

## 2011-12-25 NOTE — Progress Notes (Signed)
Late Entry:  CSW met with pt on 12/24/11 in order to assess for needs after RN called this Clinical research associate for a bus pass. At that time, pt states that he planned to go to [facility] for the evening. CSW called to verify pt could return and was made aware that pt receives SSI in the amount of $700 per month with a Riley Kill, as the payee. Philipp Deputy, from [facility], stated that the pt was unable to return due to having "used all his days". CSW explored sending the pt to [facility] in [place], however when CSW called to verify that the pt could have a room, Mauricio Po stated that "[facility] was no longer working with Tressie Ellis or [facility]". Manson Passey added that if a pt was sent, that the pt would be immediately returned to the hospital. CSW was then able to contact [place] at [facility] and arrange for the pt to be given at least a chair for the evening.   During assessment, pt stated that he was interested in going to an ALF, though this pt has been discharged by the MD. CSW explained to the pt the process of getting an ALF from the community and working with a case manger at the shelters and the [employer]. CSW then provided the pt with a bus pass with the understanding the pt was going to the [facility] that evening. No further CSW needs identified.

## 2011-12-25 NOTE — Progress Notes (Addendum)
Late entry for yesterday 12/24/11. CSW met with patient. Patient requested bus pass. CSW provided same. Patient was from [facility]. CSW called [employer]. Patient was discharged for non compliance of rules on Friday 12/20/11 and had met with family on that date and was supposed to go stay with family but was admitted into the hospital on 12/21/11. [facility] can not return to [facility] per them. Patient denies any other CSW needs.CSW consulted with case manager and informed her of same.  Clayton Gemme C. Clayton [name] MSW, LCSW [phone]

## 2015-03-01 ENCOUNTER — Emergency Department (HOSPITAL_COMMUNITY)
Admission: EM | Admit: 2015-03-01 | Discharge: 2015-03-01 | Disposition: A | Discharge status: Home / Self Care | Payer: Medicaid Other | Attending: Emergency Medicine | Admitting: Emergency Medicine

## 2015-03-01 ENCOUNTER — Encounter (HOSPITAL_COMMUNITY): Payer: Self-pay | Admitting: Emergency Medicine

## 2015-03-01 DIAGNOSIS — F919 Conduct disorder, unspecified: Secondary | ICD-10-CM | POA: Insufficient documentation

## 2015-03-01 DIAGNOSIS — I10 Essential (primary) hypertension: Secondary | ICD-10-CM | POA: Insufficient documentation

## 2015-03-01 DIAGNOSIS — Z590 Homelessness: Secondary | ICD-10-CM | POA: Insufficient documentation

## 2015-03-01 DIAGNOSIS — Z79899 Other long term (current) drug therapy: Secondary | ICD-10-CM | POA: Insufficient documentation

## 2015-03-01 DIAGNOSIS — E1165 Type 2 diabetes mellitus with hyperglycemia: Secondary | ICD-10-CM | POA: Diagnosis not present

## 2015-03-01 DIAGNOSIS — Z794 Long term (current) use of insulin: Secondary | ICD-10-CM | POA: Insufficient documentation

## 2015-03-01 DIAGNOSIS — Z8719 Personal history of other diseases of the digestive system: Secondary | ICD-10-CM | POA: Diagnosis not present

## 2015-03-01 DIAGNOSIS — R739 Hyperglycemia, unspecified: Secondary | ICD-10-CM

## 2015-03-01 DIAGNOSIS — Z9114 Patient's other noncompliance with medication regimen: Secondary | ICD-10-CM | POA: Diagnosis not present

## 2015-03-01 LAB — CBG MONITORING, ED: Glucose-Capillary: 280 mg/dL — ABNORMAL HIGH (ref 70–99)

## 2015-03-01 LAB — URINALYSIS, ROUTINE W REFLEX MICROSCOPIC
BILIRUBIN URINE: NEGATIVE
KETONES UR: NEGATIVE mg/dL
LEUKOCYTES UA: NEGATIVE
NITRITE: NEGATIVE
PROTEIN: 100 mg/dL — AB
Specific Gravity, Urine: 1.03 (ref 1.005–1.030)
UROBILINOGEN UA: 0.2 mg/dL (ref 0.0–1.0)
pH: 5 (ref 5.0–8.0)

## 2015-03-01 LAB — CBC WITH DIFFERENTIAL/PLATELET
BASOS PCT: 1 % (ref 0–1)
Basophils Absolute: 0 10*3/uL (ref 0.0–0.1)
Eosinophils Absolute: 0.2 10*3/uL (ref 0.0–0.7)
Eosinophils Relative: 6 % — ABNORMAL HIGH (ref 0–5)
HCT: 31.6 % — ABNORMAL LOW (ref 39.0–52.0)
HEMOGLOBIN: 10.6 g/dL — AB (ref 13.0–17.0)
Lymphocytes Relative: 38 % (ref 12–46)
Lymphs Abs: 1.4 10*3/uL (ref 0.7–4.0)
MCH: 30.5 pg (ref 26.0–34.0)
MCHC: 33.5 g/dL (ref 30.0–36.0)
MCV: 90.8 fL (ref 78.0–100.0)
MONO ABS: 0.4 10*3/uL (ref 0.1–1.0)
MONOS PCT: 12 % (ref 3–12)
NEUTROS ABS: 1.6 10*3/uL — AB (ref 1.7–7.7)
Neutrophils Relative %: 43 % (ref 43–77)
Platelets: 184 10*3/uL (ref 150–400)
RBC: 3.48 MIL/uL — AB (ref 4.22–5.81)
RDW: 14.3 % (ref 11.5–15.5)
WBC: 3.6 10*3/uL — ABNORMAL LOW (ref 4.0–10.5)

## 2015-03-01 LAB — URINE MICROSCOPIC-ADD ON

## 2015-03-01 LAB — RAPID URINE DRUG SCREEN, HOSP PERFORMED
Amphetamines: NOT DETECTED
BARBITURATES: NOT DETECTED
BENZODIAZEPINES: NOT DETECTED
COCAINE: NOT DETECTED
OPIATES: NOT DETECTED
TETRAHYDROCANNABINOL: NOT DETECTED

## 2015-03-01 LAB — BASIC METABOLIC PANEL
ANION GAP: 10 (ref 5–15)
BUN: 30 mg/dL — AB (ref 6–23)
CALCIUM: 8.5 mg/dL (ref 8.4–10.5)
CHLORIDE: 95 mmol/L — AB (ref 96–112)
CO2: 23 mmol/L (ref 19–32)
CREATININE: 1.64 mg/dL — AB (ref 0.50–1.35)
GFR calc Af Amer: 51 mL/min — ABNORMAL LOW (ref >90)
GFR, EST NON AFRICAN AMERICAN: 44 mL/min — AB (ref >90)
Glucose, Bld: 578 mg/dL (ref 70–99)
Potassium: 4.1 mmol/L (ref 3.5–5.1)
Sodium: 128 mmol/L — ABNORMAL LOW (ref 135–145)

## 2015-03-01 MED ORDER — SODIUM CHLORIDE 0.9 % IV SOLN: 1000.0000 mL | Freq: Once | INTRAVENOUS | Status: DC

## 2015-03-01 MED ORDER — SODIUM CHLORIDE 0.9 % IV SOLN
1000.0000 mL | INTRAVENOUS | Status: DC
  Administered 2015-03-01: 1000 mL via INTRAVENOUS

## 2015-03-01 MED ORDER — SODIUM CHLORIDE 0.9 % IV SOLN
INTRAVENOUS | Status: DC
  Administered 2015-03-01: 5.2 [IU]/h via INTRAVENOUS
  Filled 2015-03-01: qty 2.5

## 2015-03-01 NOTE — ED Notes (Signed)
Pt non compliant hypertension and diabetic. Pt CBG over 500 via EMS. Pt left AMA from previous hospital due to not being able to have sandwich prior to blood draw. Pt covered in bed bugs.

## 2015-03-01 NOTE — Discharge Instructions (Signed)
You need to take your diabetes medication and watch your diet.

## 2015-03-01 NOTE — ED Notes (Signed)
Pt stuck twice for IV access, both attempts unsuccessful. Pt states staff is allowed one more try for IV attempt.

## 2015-03-01 NOTE — ED Notes (Signed)
Bed: [id number] Expected date:  Expected time:  Means of arrival:  Comments: EMS 60M hyperglycemia

## 2015-03-01 NOTE — ED Notes (Signed)
Pt became verbally aggressive with this nurse when attempting to discharge patient.  "You can just go ahead and rip up them fucking papers cause I'm gonna do it anyway." Pt explained that all this writer had to do was get his IV out and he would be free to go.  Pt states "You ain't fuckin touching me."  Security at bedside. Pt states "you better get another nurse up in here cause you ain't touching me".  Stacy, RN assisted with removing pt's IV.  Pt refused discharge signing.

## 2015-03-01 NOTE — ED Provider Notes (Signed)
CSN: [id number]     Arrival date & time 03/01/15  4098 History   First MD Initiated Contact with Patient 03/01/15 [phone]     Chief Complaint  Patient presents with  . Hyperglycemia   Level V caveat for psychiatric problems  (Consider location/radiation/quality/duration/timing/severity/associated sxs/prior Treatment) HPI  Patient has a history of diabetes. Per EMS patient was satting other hospital and he refused to have them start his IV until they let him eat, however his blood sugar was high and when they refused he left. Patient presents here stating his blood pressure became high last night. He states he has had a cough and vomiting but no diarrhea. Of note no coughing was noted during my exam. Patient also has had no vomiting since he's been in the emergency department. Patient is very verbally blunt and rude to staff. He resents having to answer any questions.   PC[facility].  Past Medical History  Diagnosis Date  . Hypertension   . Diabetes mellitus   . Homelessness   . Periodontal disease   . Noncompliance with medications   . PTSD (post-traumatic stress disorder)   . Alcohol abuse, unspecified    History reviewed. No pertinent past surgical history. History reviewed. No pertinent family history. History  Substance Use Topics  . Smoking status: Never Smoker   . Smokeless tobacco: Not on file  . Alcohol Use: Yes     Comment: occasional, states he doesn't drink much but doesn't quanitfy    homeless  Review of Systems  All other systems reviewed and are negative.     Allergies  Review of patient's allergies indicates no known allergies.  Home Medications   Prior to Admission medications   Medication Sig Start Date End Date Taking? Authorizing Provider  amLODipine (NORVASC) 5 MG tablet Take 5 mg by mouth 2 (two) times daily.   Yes Historical Provider, MD  insulin aspart protamine- aspart (NOVOLOG  MIX 70/30) (70-30) 100 UNIT/ML injection Inject 50 Units into the skin daily.   Yes Historical Provider, MD  lisinopril (PRINIVIL,ZESTRIL) 10 MG tablet Take 10 mg by mouth daily.    Yes Historical Provider, MD  gabapentin (NEURONTIN) 100 MG capsule Take 1 capsule (100 mg total) by mouth at bedtime. Patient not taking: Reported on 03/01/2015 12/24/11 12/23/12  Hildred Laser, MD  glipiZIDE (GLUCOTROL) 5 MG tablet Take 1 tablet (5 mg total) by mouth 2 (two) times daily before a meal. Patient not taking: Reported on 03/01/2015 12/15/11   Richarda Overlie, MD  insulin aspart (NOVOLOG) 100 UNIT/ML injection Inject 6 Units into the skin 3 (three) times daily before meals. Patient not taking: Reported on 03/01/2015 12/15/11   Richarda Overlie, MD  insulin aspart protamine-insulin aspart (NOVOLOG 70/30) (70-30) 100 UNIT/ML injection Inject 20 Units into the skin daily with supper. Patient not taking: Reported on 03/01/2015 12/15/11 12/14/12  Richarda Overlie, MD  insulin aspart protamine-insulin aspart (NOVOLOG 70/30) (70-30) 100 UNIT/ML injection Inject 30 Units into the skin daily with breakfast. Patient not taking: Reported on 03/01/2015 12/15/11 12/14/12  Richarda Overlie, MD  insulin glargine (LANTUS) 100 UNIT/ML injection Inject 20 Units into the skin at bedtime. Patient not taking: Reported on 03/01/2015 12/24/11 12/23/12  Hildred Laser, MD  metoprolol tartrate (LOPRESSOR) 25 MG tablet Take 1 tablet (25 mg total) by mouth 2 (two) times daily. Patient not taking: Reported on 03/01/2015 12/24/11 12/23/12  Hildred Laser, MD  BP 198/109 mmHg  Pulse 102  Temp(Src) 98.2 F (36.8 C) (Oral)  SpO2 97%  Vital signs normal except for hypotension and tachycardia  Physical Exam  Constitutional: He is oriented to person, place, and time. He appears well-developed and well-nourished.  Non-toxic appearance. He does not appear ill. No distress.  HENT:  Head: Normocephalic and atraumatic.  Right Ear: External ear normal.  Left  Ear: External ear normal.  Nose: Nose normal. No mucosal edema or rhinorrhea.  Mouth/Throat: Mucous membranes are normal. No dental abscesses or uvula swelling.  Mildly dry tongue  Eyes: Conjunctivae and EOM are normal. Pupils are equal, round, and reactive to light.  Neck: Normal range of motion and full passive range of motion without pain. Neck supple.  Cardiovascular: Normal rate, regular rhythm and normal heart sounds.  Exam reveals no gallop and no friction rub.   No murmur heard. Pulmonary/Chest: Effort normal and breath sounds normal. No respiratory distress. He has no wheezes. He has no rhonchi. He has no rales. He exhibits no tenderness and no crepitus.  Abdominal: Soft. Normal appearance and bowel sounds are normal. He exhibits no distension. There is no tenderness. There is no rebound and no guarding.  Musculoskeletal: Normal range of motion. He exhibits no edema or tenderness.  Moves all extremities well.   Neurological: He is alert and oriented to person, place, and time. He has normal strength. No cranial nerve deficit.  Skin: Skin is warm, dry and intact. No rash noted. No erythema. No pallor.  Psychiatric: His speech is normal. His mood appears not anxious. He is aggressive.  Patient is verbally aggressive at times. He is rude to staff and myself.  Nursing note and vitals reviewed.   ED Course  Procedures (including critical care time) Medications  0.9 %  sodium chloride infusion (not administered)    Followed by  0.9 %  sodium chloride infusion (not administered)    Followed by  0.9 %  sodium chloride infusion (1,000 mLs Intravenous New Bag/Given 03/01/15 0555)  insulin regular (NOVOLIN R,HUMULIN R) 250 Units in sodium chloride 0.9 % 250 mL (1 Units/mL) infusion (5.2 Units/hr Intravenous New Bag/Given 03/01/15 0555)    Patient was given IV fluids and started on a insulin drip. He should be able to be discharged once his serum glucose level has improved to 200  range.  07:30 patient discharged  Labs Review Results for orders placed or performed during the hospital encounter of 03/01/15  Basic metabolic panel  Result Value Ref Range   Sodium 128 (L) 135 - 145 mmol/L   Potassium 4.1 3.5 - 5.1 mmol/L   Chloride 95 (L) 96 - 112 mmol/L   CO2 23 19 - 32 mmol/L   Glucose, Bld 578 (HH) 70 - 99 mg/dL   BUN 30 (H) 6 - 23 mg/dL   Creatinine, Ser 1.611.64 (H) 0.50 - 1.35 mg/dL   Calcium 8.5 8.4 - 09.610.5 mg/dL   GFR calc non Af Amer 44 (L) >90 mL/min   GFR calc Af Amer 51 (L) >90 mL/min   Anion gap 10 5 - 15  Urinalysis, Routine w reflex microscopic  Result Value Ref Range   Color, Urine YELLOW YELLOW   APPearance CLEAR CLEAR   Specific Gravity, Urine 1.030 1.005 - 1.030   pH 5.0 5.0 - 8.0   Glucose, UA >1000 (A) NEGATIVE mg/dL   Hgb urine dipstick SMALL (A) NEGATIVE   Bilirubin Urine NEGATIVE NEGATIVE   Ketones, ur NEGATIVE NEGATIVE mg/dL  Protein, ur 100 (A) NEGATIVE mg/dL   Urobilinogen, UA 0.2 0.0 - 1.0 mg/dL   Nitrite NEGATIVE NEGATIVE   Leukocytes, UA NEGATIVE NEGATIVE  Urine rapid drug screen (hosp performed)  Result Value Ref Range   Opiates NONE DETECTED NONE DETECTED   Cocaine NONE DETECTED NONE DETECTED   Benzodiazepines NONE DETECTED NONE DETECTED   Amphetamines NONE DETECTED NONE DETECTED   Tetrahydrocannabinol NONE DETECTED NONE DETECTED   Barbiturates NONE DETECTED NONE DETECTED  CBC with Differential  Result Value Ref Range   WBC 3.6 (L) 4.0 - 10.5 K/uL   RBC 3.48 (L) 4.22 - 5.81 MIL/uL   Hemoglobin 10.6 (L) 13.0 - 17.0 g/dL   HCT 96.0 (L) 45.4 - 09.8 %   MCV 90.8 78.0 - 100.0 fL   MCH 30.5 26.0 - 34.0 pg   MCHC 33.5 30.0 - 36.0 g/dL   RDW 11.9 14.7 - 82.9 %   Platelets 184 150 - 400 K/uL   Neutrophils Relative % 43 43 - 77 %   Neutro Abs 1.6 (L) 1.7 - 7.7 K/uL   Lymphocytes Relative 38 12 - 46 %   Lymphs Abs 1.4 0.7 - 4.0 K/uL   Monocytes Relative 12 3 - 12 %   Monocytes Absolute 0.4 0.1 - 1.0 K/uL   Eosinophils  Relative 6 (H) 0 - 5 %   Eosinophils Absolute 0.2 0.0 - 0.7 K/uL   Basophils Relative 1 0 - 1 %   Basophils Absolute 0.0 0.0 - 0.1 K/uL  Urine microscopic-add on  Result Value Ref Range   WBC, UA 0-2 <3 WBC/hpf   RBC / HPF 3-6 <3 RBC/hpf   Bacteria, UA RARE RARE  CBG monitoring, ED  Result Value Ref Range   Glucose-Capillary >600 (HH) 70 - 99 mg/dL   Comment 1 Notify RN   CBG monitoring, ED  Result Value Ref Range   Glucose-Capillary 280 (H) 70 - 99 mg/dL   Comment 1 Notify RN    Laboratory interpretation all normal except hyperglycemia without metabolic acidosis.     Imaging Review No results found.   EKG Interpretation None      MDM   Final diagnoses:  Hyperglycemia   Disposition discharge   Devoria Albe, MD, Concha Pyo, MD 03/01/15 [phone]

## 2015-03-02 ENCOUNTER — Encounter (HOSPITAL_COMMUNITY): Payer: Self-pay | Admitting: Emergency Medicine

## 2015-03-02 ENCOUNTER — Emergency Department (HOSPITAL_COMMUNITY)
Admission: EM | Admit: 2015-03-02 | Discharge: 2015-03-02 | Discharge status: Against Medical Advice | Payer: Medicaid Other | Attending: Emergency Medicine | Admitting: Emergency Medicine

## 2015-03-02 DIAGNOSIS — Z8659 Personal history of other mental and behavioral disorders: Secondary | ICD-10-CM | POA: Insufficient documentation

## 2015-03-02 DIAGNOSIS — M79672 Pain in left foot: Secondary | ICD-10-CM | POA: Diagnosis not present

## 2015-03-02 DIAGNOSIS — Z794 Long term (current) use of insulin: Secondary | ICD-10-CM | POA: Insufficient documentation

## 2015-03-02 DIAGNOSIS — I10 Essential (primary) hypertension: Secondary | ICD-10-CM | POA: Insufficient documentation

## 2015-03-02 DIAGNOSIS — Z79899 Other long term (current) drug therapy: Secondary | ICD-10-CM | POA: Insufficient documentation

## 2015-03-02 DIAGNOSIS — Z590 Homelessness: Secondary | ICD-10-CM | POA: Diagnosis not present

## 2015-03-02 DIAGNOSIS — E119 Type 2 diabetes mellitus without complications: Secondary | ICD-10-CM | POA: Diagnosis not present

## 2015-03-02 DIAGNOSIS — M79671 Pain in right foot: Secondary | ICD-10-CM | POA: Diagnosis not present

## 2015-03-02 NOTE — ED Notes (Signed)
[facility] and began c/o bilateral foot pain from walking so much. Pt is diabetic. Pt A&Ox4. Pt ambulatory.

## 2015-03-02 NOTE — ED Notes (Signed)
Called for triage, no response.

## 2015-03-02 NOTE — ED Notes (Signed)
Pt c/o bilateral foot pain x 2 days. Denies injury. When first asked why pt was here pt sts "I have diabetes." When asked for more details pt sts "my feet hurt." Pt difficult to get information out of. Pt has not looked at feet and does not know what state they are in. Pt sts he has been walking for 5 days. Pt ambulatory to triage.

## 2015-03-02 NOTE — ED Provider Notes (Signed)
CSN: [id number]     Arrival date & time 03/02/15  2050 History  This chart was scribed for non-physician practitioner, Emilia BeckKaitlyn Pressley Barsky, PA-C working with Raeford RazorStephen Kohut, MD by Gwenyth Oberatherine Macek, ED scribe. This patient was seen in room WTR6/WTR6 and the patient's care was started at 10:25 PM  Chief Complaint  Patient presents with  . Foot Pain   The history is provided by the patient. No language interpreter was used.    HPI Comments: Clayton [name] is a 64 y.o. male with a history of HTN, DM and PTSD who presents to the Emergency Department complaining of constant, bilateral foot pain that started 2 days ago. He states swelling of his bilateral feet as associated symptoms. Pt has history of similar pain consistent with diabetic neuropathy. He also notes [place]. Pt reports that he is supposed to take Novolog 70/30 for DM, but he does not have any medication.   No PCP   Past Medical History  Diagnosis Date  . Hypertension   . Diabetes mellitus   . Homelessness   . Periodontal disease   . Noncompliance with medications   . PTSD (post-traumatic stress disorder)   . Alcohol abuse, unspecified    History reviewed. No pertinent past surgical history. No family history on file. History  Substance Use Topics  . Smoking status: Never Smoker   . Smokeless tobacco: Not on file  . Alcohol Use: Yes     Comment: occasional, states he doesn't drink much but doesn't quanitfy     Review of Systems  Musculoskeletal: Positive for joint swelling and arthralgias.  Skin: Negative for wound.  All other systems reviewed and are negative.  Allergies  Review of patient's allergies indicates no known allergies.  Home Medications   Prior to Admission medications   Medication Sig Start Date End Date Taking? Authorizing Provider  amLODipine (NORVASC) 5 MG tablet Take 5 mg by mouth 2 (two) times daily.    Historical Provider,  MD  gabapentin (NEURONTIN) 100 MG capsule Take 1 capsule (100 mg total) by mouth at bedtime. Patient not taking: Reported on 03/01/2015 12/24/11 12/23/12  Hildred Laserharles N Nwaokocha, MD  glipiZIDE (GLUCOTROL) 5 MG tablet Take 1 tablet (5 mg total) by mouth 2 (two) times daily before a meal. Patient not taking: Reported on 03/01/2015 12/15/11   Richarda OverlieNayana Abrol, MD  insulin aspart (NOVOLOG) 100 UNIT/ML injection Inject 6 Units into the skin 3 (three) times daily before meals. Patient not taking: Reported on 03/01/2015 12/15/11   Richarda OverlieNayana Abrol, MD  insulin aspart protamine- aspart (NOVOLOG MIX 70/30) (70-30) 100 UNIT/ML injection Inject 50 Units into the skin daily.    Historical Provider, MD  insulin aspart protamine-insulin aspart (NOVOLOG 70/30) (70-30) 100 UNIT/ML injection Inject 20 Units into the skin daily with supper. Patient not taking: Reported on 03/01/2015 12/15/11 12/14/12  Richarda OverlieNayana Abrol, MD  insulin aspart protamine-insulin aspart (NOVOLOG 70/30) (70-30) 100 UNIT/ML injection Inject 30 Units into the skin daily with breakfast. Patient not taking: Reported on 03/01/2015 12/15/11 12/14/12  Richarda OverlieNayana Abrol, MD  insulin glargine (LANTUS) 100 UNIT/ML injection Inject 20 Units into the skin at bedtime. Patient not taking: Reported on 03/01/2015 12/24/11 12/23/12  Hildred Laserharles N Nwaok[place], MD  lisinopril (PRINIVIL,ZESTRIL) 10 MG tablet Take 10 mg by mouth daily.     Historical Provider, MD  metoprolol tartrate (LOPRESSOR) 25 MG tablet Take 1 tablet (25 mg total) by mouth 2 (two)  times daily. Patient not taking: Reported on 03/01/2015 12/24/11 12/23/12  Hildred Laser, MD   BP 189/87 mmHg  Pulse 97  Temp(Src) 97.6 F (36.4 C) (Oral)  Resp 16  SpO2 100% Physical Exam  Constitutional: He appears well-developed and well-nourished. No distress.  HENT:  Head: Normocephalic and atraumatic.  Eyes: Conjunctivae and EOM are normal.  Neck: Neck supple. No tracheal deviation present.  Cardiovascular: Normal rate and regular rhythm.   Exam reveals no gallop and no friction rub.   No murmur heard. Pulmonary/Chest: Effort normal and breath sounds normal. No respiratory distress. He has no wheezes. He has no rales. He exhibits no tenderness.  Abdominal: Soft. There is no tenderness.  Musculoskeletal: Normal range of motion.  Neurological: He is alert.  Speech is goal-oriented. Moves limbs without ataxia.   Skin: Skin is warm and dry.  Psychiatric: He has a normal mood and affect. His behavior is normal.  Nursing note and vitals reviewed.   ED Course  Procedures   DIAGNOSTIC STUDIES: Oxygen Saturation is 100% on RA, normal by my interpretation.    COORDINATION OF CARE: 10:37 PM Discussed treatment plan with pt at bedside and pt agreed to plan.  Labs Review Labs Reviewed - No data to display  Imaging Review No results found.   EKG Interpretation None      MDM   Final diagnoses:  Foot pain, bilateral    Patient discharged for calling myself and staff inappropriate and explicit names and refusing to answer questions.  I personally performed the services described in this documentation, which was scribed in my presence. The recorded information has been reviewed and is accurate.    [address] [place], PA-C 03/03/15 0132  Raeford Razor, MD 03/04/15 [phone]

## 2015-03-02 NOTE — ED Notes (Signed)
While PA at bedside, pt began to get agitated and curse at provider. [employer] asked to come to room to escort pt out. On way out, pt sts "I'll be better when these dumb ass bitches get out of my face." Pt walked out with [employer].

## 2015-03-02 NOTE — Progress Notes (Signed)
CSW attempted to speak with pt at beside. However, the pt appeared to be aggressive to PA. Patient was yelling.  CSW attempted to offer the pt resources. However, the pt did not accept.   Patient was escorted out by police.  Trish MageBrittney Tran Randle, LCSWA [phone] ED CSW 03/02/2015 10:53 PM

## 2015-05-21 ENCOUNTER — Encounter (HOSPITAL_COMMUNITY): Payer: Self-pay

## 2015-05-21 ENCOUNTER — Emergency Department (HOSPITAL_COMMUNITY)
Admission: EM | Admit: 2015-05-21 | Discharge: 2015-05-22 | Disposition: A | Discharge status: Home / Self Care | Payer: Medicaid Other | Attending: Emergency Medicine | Admitting: Emergency Medicine

## 2015-05-21 DIAGNOSIS — I10 Essential (primary) hypertension: Secondary | ICD-10-CM | POA: Diagnosis not present

## 2015-05-21 DIAGNOSIS — Z9119 Patient's noncompliance with other medical treatment and regimen: Secondary | ICD-10-CM | POA: Insufficient documentation

## 2015-05-21 DIAGNOSIS — Z8659 Personal history of other mental and behavioral disorders: Secondary | ICD-10-CM | POA: Insufficient documentation

## 2015-05-21 DIAGNOSIS — Z79899 Other long term (current) drug therapy: Secondary | ICD-10-CM | POA: Insufficient documentation

## 2015-05-21 DIAGNOSIS — Z590 Homelessness: Secondary | ICD-10-CM | POA: Insufficient documentation

## 2015-05-21 DIAGNOSIS — E1165 Type 2 diabetes mellitus with hyperglycemia: Secondary | ICD-10-CM | POA: Insufficient documentation

## 2015-05-21 DIAGNOSIS — R112 Nausea with vomiting, unspecified: Secondary | ICD-10-CM | POA: Diagnosis present

## 2015-05-21 DIAGNOSIS — R197 Diarrhea, unspecified: Secondary | ICD-10-CM | POA: Insufficient documentation

## 2015-05-21 DIAGNOSIS — R739 Hyperglycemia, unspecified: Secondary | ICD-10-CM

## 2015-05-21 DIAGNOSIS — R600 Localized edema: Secondary | ICD-10-CM | POA: Insufficient documentation

## 2015-05-21 DIAGNOSIS — Z794 Long term (current) use of insulin: Secondary | ICD-10-CM | POA: Diagnosis not present

## 2015-05-21 DIAGNOSIS — Z8719 Personal history of other diseases of the digestive system: Secondary | ICD-10-CM | POA: Diagnosis not present

## 2015-05-21 DIAGNOSIS — R1084 Generalized abdominal pain: Secondary | ICD-10-CM | POA: Diagnosis not present

## 2015-05-21 LAB — CBC WITH DIFFERENTIAL/PLATELET
BASOS PCT: 0 % (ref 0–1)
Basophils Absolute: 0 10*3/uL (ref 0.0–0.1)
Eosinophils Absolute: 0.2 10*3/uL (ref 0.0–0.7)
Eosinophils Relative: 6 % — ABNORMAL HIGH (ref 0–5)
HCT: 28.3 % — ABNORMAL LOW (ref 39.0–52.0)
Hemoglobin: 9.5 g/dL — ABNORMAL LOW (ref 13.0–17.0)
LYMPHS PCT: 30 % (ref 12–46)
Lymphs Abs: 1.3 10*3/uL (ref 0.7–4.0)
MCH: 28.7 pg (ref 26.0–34.0)
MCHC: 33.6 g/dL (ref 30.0–36.0)
MCV: 85.5 fL (ref 78.0–100.0)
Monocytes Absolute: 0.4 10*3/uL (ref 0.1–1.0)
Monocytes Relative: 10 % (ref 3–12)
NEUTROS ABS: 2.3 10*3/uL (ref 1.7–7.7)
Neutrophils Relative %: 54 % (ref 43–77)
PLATELETS: 224 10*3/uL (ref 150–400)
RBC: 3.31 MIL/uL — AB (ref 4.22–5.81)
RDW: 14 % (ref 11.5–15.5)
WBC: 4.2 10*3/uL (ref 4.0–10.5)

## 2015-05-21 LAB — URINALYSIS, ROUTINE W REFLEX MICROSCOPIC
BILIRUBIN URINE: NEGATIVE
Glucose, UA: >1000 mg/dL — AB
KETONES UR: NEGATIVE mg/dL
Leukocytes, UA: NEGATIVE
NITRITE: NEGATIVE
PH: 6.5 (ref 5.0–8.0)
Specific Gravity, Urine: 1.023 (ref 1.005–1.030)
UROBILINOGEN UA: 1 mg/dL (ref 0.0–1.0)

## 2015-05-21 LAB — COMPREHENSIVE METABOLIC PANEL
ALBUMIN: 2.5 g/dL — AB (ref 3.5–5.0)
ALK PHOS: 178 U/L — AB (ref 38–126)
ALT: 26 U/L (ref 17–63)
ANION GAP: 9 (ref 5–15)
AST: 27 U/L (ref 15–41)
BUN: 16 mg/dL (ref 6–20)
CALCIUM: 8.5 mg/dL — AB (ref 8.9–10.3)
CO2: 25 mmol/L (ref 22–32)
CREATININE: 1.72 mg/dL — AB (ref 0.61–1.24)
Chloride: 99 mmol/L — ABNORMAL LOW (ref 101–111)
GFR calc Af Amer: 48 mL/min — ABNORMAL LOW (ref >60)
GFR, EST NON AFRICAN AMERICAN: 41 mL/min — AB (ref >60)
GLUCOSE: 549 mg/dL — AB (ref 65–99)
POTASSIUM: 3.7 mmol/L (ref 3.5–5.1)
Sodium: 133 mmol/L — ABNORMAL LOW (ref 135–145)
Total Bilirubin: 0.3 mg/dL (ref 0.3–1.2)
Total Protein: 6.2 g/dL — ABNORMAL LOW (ref 6.5–8.1)

## 2015-05-21 LAB — CBG MONITORING, ED
Glucose-Capillary: 553 mg/dL (ref 65–99)
Glucose-Capillary: 434 mg/dL — ABNORMAL HIGH (ref 65–99)

## 2015-05-21 LAB — URINE MICROSCOPIC-ADD ON

## 2015-05-21 LAB — LIPASE, BLOOD: LIPASE: 24 U/L (ref 22–51)

## 2015-05-21 MED ORDER — FENTANYL CITRATE (PF) 100 MCG/2ML IJ SOLN
50.0000 ug | Freq: Once | INTRAMUSCULAR | Status: AC
  Administered 2015-05-21: 50 ug via INTRAVENOUS
  Filled 2015-05-21: qty 2

## 2015-05-21 MED ORDER — ONDANSETRON HCL 4 MG/2ML IJ SOLN
4.0000 mg | Freq: Once | INTRAMUSCULAR | Status: AC
  Administered 2015-05-21: 4 mg via INTRAVENOUS
  Filled 2015-05-21: qty 2

## 2015-05-21 MED ORDER — SODIUM CHLORIDE 0.9 % IV BOLUS (SEPSIS)
1000.0000 mL | INTRAVENOUS | Status: AC
  Administered 2015-05-21: 1000 mL via INTRAVENOUS

## 2015-05-21 MED ORDER — INSULIN ASPART 100 UNIT/ML ~~LOC~~ SOLN
5.0000 [IU] | Freq: Once | SUBCUTANEOUS | Status: AC
  Administered 2015-05-21: 5 [IU] via INTRAVENOUS
  Filled 2015-05-21: qty 1

## 2015-05-21 NOTE — ED Notes (Addendum)
Pt is refusing to allow this RN to start IV fluids and is refusing this RN to draw blood. Pt is stating "You don't tell me what I need to do! I am an adult, and I don't want my blood drawn!" Pt was advised that his behavior and language were not going to be tolerated and that if he spoke to staff in this manner again security would be alerted and asked to speak to the pt.  Dr. Romeo Apple made aware.

## 2015-05-21 NOTE — ED Notes (Signed)
Dr Harrison at bedside

## 2015-05-21 NOTE — ED Notes (Signed)
Per EMS: Pt complaining of abdominal pain with nausea and vomiting. Pt describes emesis as clear with "tastes of food in it." EMS states CBG = 575. EMS gave 4mg  zofran IV. BP = 198/98, HR 92.

## 2015-05-21 NOTE — ED Notes (Signed)
When this RN attempted to move the pts blankets and gowns, while explaining what I was going to be doing and why, the pt started to yell "My hands and belly are not the same thing! Why do I have to move my hands for you to 'see my belly?!'" The pt had his hands resting on his abdomen. This RN explained that since he was complaining of GI distress I needed to listen to his abdomen with my stethoscope and could not do so if his hands were in the way. While this RN was listening to the pts abdomen the pt started to swear "godman nurses think they can do whatever they want", this RN asked the pt what he had said and asked if he could repeat himself, the pt refused, and this RN did not ask him to repeat himself again.

## 2015-05-21 NOTE — ED Notes (Addendum)
Pt is being non cooperative. Pt is not allowing staff to assess him.  Pt states "You don't need to ask me questions to 'see how I am doing', I'm an adult, you don't tell me what to do!", this RN explained to the pt that I needed to ask him questions to assess him so that I would better know how to help him. The pt then allowed this RN to assess his abdomen.

## 2015-05-21 NOTE — ED Notes (Signed)
Pt allowed this RN to attempt to draw blood. Prior to this RN drawing blood the pt was told that if he became verbally abusive again and spoke to me in a disrespectful manner then I would be alerting security. While this RN was attempting to draw blood the pt began to demand that this RN give the pt orange juice and ice cream. This RN explained that he was not supposed to eat or drink until all test results have come back. The pt again demanded ice cream, this RN told the pt that we do not have ice cream. The pt stated that it is our job to feed him and that should be our priority, this RN stated that ensuring his health and safety were our priority and not feeding him. The pt began to say "you all think you have this big fucking name, but you don't give a damn about me, your supposed to feed me!". This RN told the pt again that this behavior is not going to be tolerated and that security will be notified and that I would not be drawing his blood while he was speaking to me in this way.   Security and Dr. Romeo Apple notified.

## 2015-05-21 NOTE — ED Provider Notes (Signed)
CSN: [id number]     Arrival date & time 05/21/15  1941 History   First MD Initiated Contact with Patient 05/21/15 1943     Chief Complaint  Patient presents with  . Abdominal Pain     (Consider location/radiation/quality/duration/timing/severity/associated sxs/prior Treatment) Patient is a 64 y.o. male presenting with abdominal pain. The history is provided by the patient.  Abdominal Pain Pain location:  Generalized Pain quality: cramping   Pain radiates to:  Does not radiate Pain severity:  Mild Onset quality:  Sudden Timing:  Constant Progression:  Unchanged Chronicity:  New Context: suspicious food intake   Relieved by:  Nothing Worsened by:  Nothing tried Ineffective treatments:  None tried Associated symptoms: diarrhea, nausea and vomiting   Associated symptoms: no chest pain, no cough, no dysuria, no fever, no hematuria and no shortness of breath     Past Medical History  Diagnosis Date  . Hypertension   . Diabetes mellitus   . Homelessness   . Periodontal disease   . Noncompliance with medications   . PTSD (post-traumatic stress disorder)   . Alcohol abuse, unspecified    History reviewed. No pertinent past surgical history. History reviewed. No pertinent family history. History  Substance Use Topics  . Smoking status: Never Smoker   . Smokeless tobacco: Not on file  . Alcohol Use: Yes     Comment: occasional, states he doesn't drink much but doesn't quanitfy     Review of Systems  Constitutional: Negative for fever.  HENT: Negative for drooling and rhinorrhea.   Eyes: Negative for pain.  Respiratory: Negative for cough and shortness of breath.   Cardiovascular: Negative for chest pain and leg swelling.  Gastrointestinal: Positive for nausea, vomiting, abdominal pain and diarrhea.  Genitourinary: Negative for dysuria and hematuria.  Musculoskeletal: Negative for gait problem and neck pain.  Skin: Negative for color change.  Neurological: Negative for  numbness and headaches.  Hematological: Negative for adenopathy.  Psychiatric/Behavioral: Negative for behavioral problems.  All other systems reviewed and are negative.     Allergies  Review of patient's allergies indicates no known allergies.  Home Medications   Prior to Admission medications   Medication Sig Start Date End Date Taking? Authorizing Provider  amLODipine (NORVASC) 5 MG tablet Take 5 mg by mouth 2 (two) times daily.    Historical Provider, MD  gabapentin (NEURONTIN) 100 MG capsule Take 1 capsule (100 mg total) by mouth at bedtime. Patient not taking: Reported on 03/01/2015 12/24/11 12/23/12  Hildred Laser, MD  glipiZIDE (GLUCOTROL) 5 MG tablet Take 1 tablet (5 mg total) by mouth 2 (two) times daily before a meal. Patient not taking: Reported on 03/01/2015 12/15/11   Richarda Overlie, MD  insulin aspart (NOVOLOG) 100 UNIT/ML injection Inject 6 Units into the skin 3 (three) times daily before meals. Patient not taking: Reported on 03/01/2015 12/15/11   Richarda Overlie, MD  insulin aspart protamine- aspart (NOVOLOG MIX 70/30) (70-30) 100 UNIT/ML injection Inject 50 Units into the skin daily.    Historical Provider, MD  insulin aspart protamine-insulin aspart (NOVOLOG 70/30) (70-30) 100 UNIT/ML injection Inject 20 Units into the skin daily with supper. Patient not taking: Reported on 03/01/2015 12/15/11 12/14/12  Richarda Overlie, MD  insulin aspart protamine-insulin aspart (NOVOLOG 70/30) (70-30) 100 UNIT/ML injection Inject 30 Units into the skin daily with breakfast. Patient not taking: Reported on 03/01/2015 12/15/11 12/14/12  Richarda Overlie, MD  insulin glargine (LANTUS) 100 UNIT/ML injection Inject 20 Units into the skin at bedtime.  Patient not taking: Reported on 03/01/2015 12/24/11 12/23/12  Hildred Laser, MD  lisinopril (PRINIVIL,ZESTRIL) 10 MG tablet Take 10 mg by mouth daily.     Historical Provider, MD  metoprolol tartrate (LOPRESSOR) 25 MG tablet Take 1 tablet (25 mg total) by mouth 2  (two) times daily. Patient not taking: Reported on 03/01/2015 12/24/11 12/23/12  Hildred Laser, MD   BP 191/83 mmHg  Pulse 89  Resp 22  Ht  (1.854 m)  Wt 197 lb (89.359 kg)  BMI 26.00 kg/m2  SpO2 99% Physical Exam  Constitutional: He is oriented to person, place, and time. He appears well-developed and well-nourished.  HENT:  Head: Normocephalic and atraumatic.  Right Ear: External ear normal.  Left Ear: External ear normal.  Nose: Nose normal.  Mouth/Throat: Oropharynx is clear and moist. No oropharyngeal exudate.  Eyes: Conjunctivae and EOM are normal. Pupils are equal, round, and reactive to light.  Neck: Normal range of motion. Neck supple.  Cardiovascular: Normal rate, regular rhythm, normal heart sounds and intact distal pulses.  Exam reveals no gallop and no friction rub.   No murmur heard. Pulmonary/Chest: Effort normal and breath sounds normal. No respiratory distress. He has no wheezes.  Abdominal: Soft. Bowel sounds are normal. He exhibits no distension. There is tenderness (mild diffuse tenderness to palpation.). There is no rebound and no guarding.  Musculoskeletal: Normal range of motion. He exhibits edema. He exhibits no tenderness.  Mild faintly pitting edema to distal bilateral lower extremities.  Mild excoriations noted on the anterior shins bilaterally. One larger excoriation noted on the right anterior shin.  Neurological: He is alert and oriented to person, place, and time.  Skin: Skin is warm and dry.  Psychiatric: He has a normal mood and affect. His behavior is normal.  Nursing note and vitals reviewed.   ED Course  Procedures (including critical care time) Labs Review Labs Reviewed  CBC WITH DIFFERENTIAL/PLATELET - Abnormal; Notable for the following:    RBC 3.31 (*)    Hemoglobin 9.5 (*)    HCT 28.3 (*)    Eosinophils Relative 6 (*)    All other components within normal limits  COMPREHENSIVE METABOLIC PANEL - Abnormal; Notable for the  following:    Sodium 133 (*)    Chloride 99 (*)    Glucose, Bld 549 (*)    Creatinine, Ser 1.72 (*)    Calcium 8.5 (*)    Total Protein 6.2 (*)    Albumin 2.5 (*)    Alkaline Phosphatase 178 (*)    GFR calc non Af Amer 41 (*)    GFR calc Af Amer 48 (*)    All other components within normal limits  URINALYSIS, ROUTINE W REFLEX MICROSCOPIC (NOT AT [facility]) - Abnormal; Notable for the following:    Glucose, UA >1000 (*)    Hgb urine dipstick SMALL (*)    Protein, ur >300 (*)    All other components within normal limits  CBG MONITORING, ED - Abnormal; Notable for the following:    Glucose-Capillary 553 (*)    All other components within normal limits  CBG MONITORING, ED - Abnormal; Notable for the following:    Glucose-Capillary 434 (*)    All other components within normal limits  LIPASE, BLOOD  URINE MICROSCOPIC-ADD ON    Imaging Review No results found.   EKG Interpretation None      MDM   Final diagnoses:  Hyperglycemia  Nausea and vomiting, vomiting of unspecified type  8:25 PM 64 y.o. male with a history of hypertension, diabetes, homelessness, alcohol abuse who presents with nausea, vomiting, diarrhea, and abdominal pain which began about 1-2 hours after eating a faint light lunch today. He is suspicious that one of the desserts he ate was spoiled. He states that he has not had any alcohol today. He states that he uses his insulin sparingly due to an inability to get syringes. He did take some insulin yesterday. He is hypertensive here vital signs otherwise unremarkable. Abdomen soft without focal tenderness. Suspect food poisoning versus gastroenteritis. We'll get screening labs and IV fluid. He denies cp.   12:40 AM: Pt tolerating liquid. No vomiting here. His abd remains benign. Cr proximal to last check. Labs otherwise unremarkable except for hyperglycemia which is decreasing with IV fluids and small amount of insulin. Will also give him a subcutaneous dose of Lantus  prior to departure. I have discussed the diagnosis/risks/treatment options with the patient and believe the pt to be eligible for discharge home to follow-up with his pcp. We also discussed returning to the ED immediately if new or worsening sx occur. We discussed the sx which are most concerning (e.g., continued vomiting, fever, worsening abd pain) that necessitate immediate return. Medications administered to the patient during their visit and any new prescriptions provided to the patient are listed below.  Medications given during this visit Medications  insulin glargine (LANTUS) Solostar Pen 20 Units (not administered)  sodium chloride 0.9 % bolus 1,000 mL (1,000 mLs Intravenous New Bag/Given 05/21/15 2131)  ondansetron (ZOFRAN) injection 4 mg (4 mg Intravenous Given 05/21/15 2132)  fentaNYL (SUBLIMAZE) injection 50 mcg (50 mcg Intravenous Given 05/21/15 2131)  insulin aspart (novoLOG) injection 5 Units (5 Units Intravenous Given 05/21/15 2323)    New Prescriptions   ONDANSETRON (ZOFRAN ODT) 4 MG DISINTEGRATING TABLET     ODT q4 hours prn nausea/vomit     Purvis Sheffield, MD 05/22/15 [phone]

## 2015-05-21 NOTE — ED Notes (Signed)
Clayton [name] at bedside

## 2015-05-21 NOTE — ED Notes (Signed)
Pts CBG 434

## 2015-05-21 NOTE — ED Notes (Signed)
Dr. Romeo Apple at bedside and security outside of room.

## 2015-05-22 MED ORDER — INSULIN GLARGINE 100 UNITS/ML SOLOSTAR PEN
20.0000 [IU] | PEN_INJECTOR | SUBCUTANEOUS | Status: AC
  Administered 2015-05-22: 20 [IU] via SUBCUTANEOUS
  Filled 2015-05-22: qty 3

## 2015-05-22 MED ORDER — ONDANSETRON 4 MG PO TBDP: ORAL_TABLET | ORAL | Status: DC

## 2015-05-22 NOTE — ED Notes (Signed)
Reviewed use of lantus and administration. Provided extra needle tips for use at home as well and sent lantus pin with the patient.

## 2015-05-22 NOTE — ED Notes (Signed)
Reviewed plan of care with Dr. Romeo Apple. Patient is to use lantus pin at home. Contacted pharmacy, they are sending for home use.

## 2015-05-22 NOTE — ED Notes (Signed)
[facility] as patient requested, he has no bed there. Called [facility], no available bed there. Dr. Fabienne Bruns allowing patient to stay until placement is found.

## 2015-05-22 NOTE — ED Notes (Signed)
Dr Harrison at bedside

## 2015-05-29 ENCOUNTER — Encounter (HOSPITAL_COMMUNITY): Payer: Self-pay | Admitting: *Deleted

## 2015-05-29 ENCOUNTER — Emergency Department (HOSPITAL_COMMUNITY)
Admission: EM | Admit: 2015-05-29 | Discharge: 2015-05-30 | Disposition: A | Discharge status: Home / Self Care | Payer: Medicaid Other | Attending: Emergency Medicine | Admitting: Emergency Medicine

## 2015-05-29 DIAGNOSIS — I10 Essential (primary) hypertension: Secondary | ICD-10-CM | POA: Insufficient documentation

## 2015-05-29 DIAGNOSIS — Y929 Unspecified place or not applicable: Secondary | ICD-10-CM | POA: Insufficient documentation

## 2015-05-29 DIAGNOSIS — S80212A Abrasion, left knee, initial encounter: Secondary | ICD-10-CM | POA: Insufficient documentation

## 2015-05-29 DIAGNOSIS — Y998 Other external cause status: Secondary | ICD-10-CM | POA: Diagnosis not present

## 2015-05-29 DIAGNOSIS — E1165 Type 2 diabetes mellitus with hyperglycemia: Secondary | ICD-10-CM | POA: Insufficient documentation

## 2015-05-29 DIAGNOSIS — Z8659 Personal history of other mental and behavioral disorders: Secondary | ICD-10-CM | POA: Insufficient documentation

## 2015-05-29 DIAGNOSIS — Z9114 Patient's other noncompliance with medication regimen: Secondary | ICD-10-CM

## 2015-05-29 DIAGNOSIS — Z5900 Homelessness unspecified: Secondary | ICD-10-CM

## 2015-05-29 DIAGNOSIS — S80211A Abrasion, right knee, initial encounter: Secondary | ICD-10-CM | POA: Insufficient documentation

## 2015-05-29 DIAGNOSIS — Z794 Long term (current) use of insulin: Secondary | ICD-10-CM | POA: Diagnosis not present

## 2015-05-29 DIAGNOSIS — Y939 Activity, unspecified: Secondary | ICD-10-CM | POA: Diagnosis not present

## 2015-05-29 DIAGNOSIS — Z9119 Patient's noncompliance with other medical treatment and regimen: Secondary | ICD-10-CM | POA: Insufficient documentation

## 2015-05-29 DIAGNOSIS — X58XXXA Exposure to other specified factors, initial encounter: Secondary | ICD-10-CM | POA: Diagnosis not present

## 2015-05-29 DIAGNOSIS — R460 Very low level of personal hygiene: Secondary | ICD-10-CM | POA: Diagnosis not present

## 2015-05-29 DIAGNOSIS — R600 Localized edema: Secondary | ICD-10-CM | POA: Insufficient documentation

## 2015-05-29 DIAGNOSIS — R739 Hyperglycemia, unspecified: Secondary | ICD-10-CM

## 2015-05-29 LAB — CBG MONITORING, ED: Glucose-Capillary: 359 mg/dL — ABNORMAL HIGH (ref 65–99)

## 2015-05-29 MED ORDER — SODIUM CHLORIDE 0.9 % IV SOLN: INTRAVENOUS | Status: DC

## 2015-05-29 MED ORDER — SODIUM CHLORIDE 0.9 % IV BOLUS (SEPSIS)
1000.0000 mL | Freq: Once | INTRAVENOUS | Status: AC
  Administered 2015-05-29: 1000 mL via INTRAVENOUS

## 2015-05-29 MED ORDER — INSULIN ASPART 100 UNIT/ML ~~LOC~~ SOLN
8.0000 [IU] | Freq: Once | SUBCUTANEOUS | Status: AC
  Administered 2015-05-29: 8 [IU] via SUBCUTANEOUS
  Filled 2015-05-29: qty 1

## 2015-05-29 NOTE — ED Notes (Signed)
Pt arrives from [facility] [employer] c/o "hunger and diabetic issues". Per EMS pt is homeless and non compliant with meds.

## 2015-05-29 NOTE — ED Provider Notes (Signed)
CSN: [id number]     Arrival date & time 05/29/15  2302 History   First MD Initiated Contact with Patient 05/29/15 2312     Chief Complaint  Patient presents with  . Hyperglycemia     (Consider location/radiation/quality/duration/timing/severity/associated sxs/prior Treatment) Patient is a 64 y.o. male presenting with hyperglycemia. The history is provided by the patient and medical records. No language interpreter was used.  Hyperglycemia Associated symptoms: no abdominal pain, no chest pain, no diaphoresis, no dysuria, no fatigue, no fever, no increased thirst, no nausea, no polyuria, no shortness of breath and no vomiting     Clayton [name] is a 64 y.o. male  with a hx of IDDM (noncompliant), HTN, PTSD, EtOH abuse, Homelessness presents to the Emergency Department via EMS complaining of "diabetic issues" but upon my questioning he reports that he simply wants to watch TV.  He reports that his blood sugar normally runs high.  He does complain of abrasions to the bilateral knees and leg swelling (chronic).  No known aggravating or alleviating factors for those.  Pt denies fever, headache, neck pain, chest pain, shortness of breath, abdominal pain, nausea, vomiting, diarrhea, weakness, dizziness, syncope, dysuria..     Past Medical History  Diagnosis Date  . Hypertension   . Diabetes mellitus   . Homelessness   . Periodontal disease   . Noncompliance with medications   . PTSD (post-traumatic stress disorder)   . Alcohol abuse, unspecified    History reviewed. No pertinent past surgical history. No family history on file. History  Substance Use Topics  . Smoking status: Never Smoker   . Smokeless tobacco: Not on file  . Alcohol Use: Yes     Comment: occasional, states he doesn't drink much but doesn't quanitfy     Review of Systems  Constitutional: Negative for fever, diaphoresis, appetite change, fatigue and unexpected weight change.  HENT: Negative for mouth sores.   Eyes:  Negative for visual disturbance.  Respiratory: Negative for cough, chest tightness, shortness of breath and wheezing.   Cardiovascular: Positive for leg swelling. Negative for chest pain.  Gastrointestinal: Negative for nausea, vomiting, abdominal pain, diarrhea and constipation.  Endocrine: Negative for polydipsia, polyphagia and polyuria.  Genitourinary: Negative for dysuria, urgency, frequency and hematuria.  Musculoskeletal: Negative for back pain and neck stiffness.  Skin: Positive for wound. Negative for rash.  Allergic/Immunologic: Negative for immunocompromised state.  Neurological: Negative for syncope, light-headedness and headaches.  Hematological: Does not bruise/bleed easily.  Psychiatric/Behavioral: Negative for sleep disturbance. The patient is not nervous/anxious.       Allergies  Review of patient's allergies indicates no known allergies.  Home Medications   Prior to Admission medications   Medication Sig Start Date End Date Taking? Authorizing Provider  gabapentin (NEURONTIN) 100 MG capsule Take 1 capsule (100 mg total) by mouth at bedtime. Patient not taking: Reported on 05/21/2015 12/24/11 05/21/15  Hildred Laser, MD  glipiZIDE (GLUCOTROL) 5 MG tablet Take 1 tablet (5 mg total) by mouth 2 (two) times daily before a meal. Patient not taking: Reported on 03/01/2015 12/15/11   Richarda Overlie, MD  insulin aspart (NOVOLOG) 100 UNIT/ML injection Inject 6 Units into the skin 3 (three) times daily before meals. Patient not taking: Reported on 03/01/2015 12/15/11   Richarda Overlie, MD  insulin aspart protamine-insulin aspart (NOVOLOG 70/30) (70-30) 100 UNIT/ML injection Inject 20 Units into the skin daily with supper. Patient not taking: Reported on 05/21/2015 12/15/11 05/21/15  Richarda Overlie, MD  insulin aspart protamine-insulin  aspart (NOVOLOG 70/30) (70-30) 100 UNIT/ML injection Inject 30 Units into the skin daily with breakfast. Patient not taking: Reported on 05/21/2015 12/15/11  05/21/15  Richarda Overlie, MD  insulin glargine (LANTUS) 100 UNIT/ML injection Inject 20 Units into the skin at bedtime. Patient not taking: Reported on 05/21/2015 12/24/11 05/21/15  Hildred Laser, MD  metoprolol tartrate (LOPRESSOR) 25 MG tablet Take 1 tablet (25 mg total) by mouth 2 (two) times daily. Patient not taking: Reported on 05/21/2015 12/24/11 05/21/15  Hildred Laser, MD  ondansetron (ZOFRAN ODT) 4 MG disintegrating tablet 4mg  ODT q4 hours prn nausea/vomit Patient not taking: Reported on 05/30/2015 05/22/15   Purvis Sheffield, MD   BP 189/99 mmHg  Pulse 89  Temp(Src) 98.5 F (36.9 C) (Oral)  Resp 18  SpO2 95% Physical Exam  Constitutional: He appears well-developed and well-nourished. No distress.  Awake, alert, nontoxic appearance Disheveled  HENT:  Head: Normocephalic and atraumatic.  Mouth/Throat: Oropharynx is clear and moist. No oropharyngeal exudate.  Eyes: Conjunctivae are normal. No scleral icterus.  Neck: Normal range of motion. Neck supple.  Cardiovascular: Normal rate, regular rhythm, normal heart sounds and intact distal pulses.   No murmur heard. Pulmonary/Chest: Effort normal and breath sounds normal. No respiratory distress. He has no wheezes.  Equal chest expansion  Abdominal: Soft. Bowel sounds are normal. He exhibits no mass. There is no tenderness. There is no rebound and no guarding.  Soft and nontender  Musculoskeletal: Normal range of motion. He exhibits edema.  2+ pitting edema of the lower legs and forefoot; no weeping or erythema  Neurological: He is alert.  Speech is clear and goal oriented Moves extremities without ataxia  Skin: Skin is warm and dry. He is not diaphoretic.  Abrasions to the bilateral knees without surrounding erythema, induration or increased warmth.    Psychiatric: He has a normal mood and affect.  Nursing note and vitals reviewed.   ED Course  Procedures (including critical care time) Labs Review Labs Reviewed  CBG  MONITORING, ED - Abnormal; Notable for the following:    Glucose-Capillary 359 (*)    All other components within normal limits  URINALYSIS, ROUTINE W REFLEX MICROSCOPIC (NOT AT [facility])  CBC  BASIC METABOLIC PANEL  I-STAT CHEM 8, ED  CBG MONITORING, ED    Imaging Review No results found.   EKG Interpretation None      MDM   Final diagnoses:  Hyperglycemia  Homelessness  Noncompliance with medications  Poor personal hygiene   Lyndel Safe presents with hyperglycemia and hx of uncontrolled IDDM.  CBG 359.  Will check labs, give fluid and insulin and reassess.    1:15 AM Labs and UA pending.  Repeat CBG 212.  Pt signed out to Dr. Judd Lien who will follow-up on labs.  If no evidence of DKA, pt is to be d/c home.    Dahlia Client Brailynn Breth, PA-C 05/30/15 0141  Lorre Nick, MD 06/01/15 [phone]

## 2015-05-30 LAB — CBC
HEMATOCRIT: 46 % (ref 39.0–52.0)
Hemoglobin: 15.3 g/dL (ref 13.0–17.0)
MCH: 28.6 pg (ref 26.0–34.0)
MCHC: 33.3 g/dL (ref 30.0–36.0)
MCV: 86 fL (ref 78.0–100.0)
Platelets: 110 10*3/uL — ABNORMAL LOW (ref 150–400)
RBC: 5.35 MIL/uL (ref 4.22–5.81)
RDW: 14.6 % (ref 11.5–15.5)
WBC: 3.2 10*3/uL — ABNORMAL LOW (ref 4.0–10.5)

## 2015-05-30 LAB — URINALYSIS, ROUTINE W REFLEX MICROSCOPIC
Bilirubin Urine: NEGATIVE
Ketones, ur: NEGATIVE mg/dL
Leukocytes, UA: NEGATIVE
Nitrite: NEGATIVE
PH: 6 (ref 5.0–8.0)
Protein, ur: >300 mg/dL — AB
Specific Gravity, Urine: 1.023 (ref 1.005–1.030)
Urobilinogen, UA: 0.2 mg/dL (ref 0.0–1.0)

## 2015-05-30 LAB — BASIC METABOLIC PANEL
Anion gap: 6 (ref 5–15)
BUN: 19 mg/dL (ref 6–20)
CALCIUM: 8.3 mg/dL — AB (ref 8.9–10.3)
CO2: 25 mmol/L (ref 22–32)
CREATININE: 1.54 mg/dL — AB (ref 0.61–1.24)
Chloride: 100 mmol/L — ABNORMAL LOW (ref 101–111)
GFR calc Af Amer: 54 mL/min — ABNORMAL LOW (ref >60)
GFR calc non Af Amer: 47 mL/min — ABNORMAL LOW (ref >60)
Glucose, Bld: 397 mg/dL — ABNORMAL HIGH (ref 65–99)
Potassium: 3.6 mmol/L (ref 3.5–5.1)
Sodium: 131 mmol/L — ABNORMAL LOW (ref 135–145)

## 2015-05-30 LAB — URINE MICROSCOPIC-ADD ON

## 2015-05-30 LAB — CBG MONITORING, ED: Glucose-Capillary: 212 mg/dL — ABNORMAL HIGH (ref 65–99)

## 2015-05-30 NOTE — Discharge Instructions (Signed)
1. Medications: usual home medications 2. Treatment: rest, drink plenty of fluids,  3. Follow Up: Please followup with your primary doctor in 3 days for discussion of your diagnoses and further evaluation after today's visit; if you do not have a primary care doctor use the resource guide provided to find one; Please return to the ER for worsening symptoms, fever, signs of infection or other concerns

## 2015-06-04 ENCOUNTER — Encounter (HOSPITAL_COMMUNITY): Payer: Self-pay

## 2015-06-04 ENCOUNTER — Emergency Department (HOSPITAL_COMMUNITY): Payer: Medicaid Other

## 2015-06-04 ENCOUNTER — Emergency Department (HOSPITAL_COMMUNITY)
Admission: EM | Admit: 2015-06-04 | Discharge: 2015-06-04 | Disposition: A | Discharge status: Home / Self Care | Payer: Medicaid Other | Attending: Emergency Medicine | Admitting: Emergency Medicine

## 2015-06-04 DIAGNOSIS — Z590 Homelessness: Secondary | ICD-10-CM | POA: Insufficient documentation

## 2015-06-04 DIAGNOSIS — Z8719 Personal history of other diseases of the digestive system: Secondary | ICD-10-CM | POA: Diagnosis not present

## 2015-06-04 DIAGNOSIS — I10 Essential (primary) hypertension: Secondary | ICD-10-CM | POA: Insufficient documentation

## 2015-06-04 DIAGNOSIS — L97911 Non-pressure chronic ulcer of unspecified part of right lower leg limited to breakdown of skin: Secondary | ICD-10-CM | POA: Insufficient documentation

## 2015-06-04 DIAGNOSIS — R739 Hyperglycemia, unspecified: Secondary | ICD-10-CM

## 2015-06-04 DIAGNOSIS — E1165 Type 2 diabetes mellitus with hyperglycemia: Secondary | ICD-10-CM | POA: Insufficient documentation

## 2015-06-04 DIAGNOSIS — Z79899 Other long term (current) drug therapy: Secondary | ICD-10-CM | POA: Diagnosis not present

## 2015-06-04 DIAGNOSIS — Z9119 Patient's noncompliance with other medical treatment and regimen: Secondary | ICD-10-CM | POA: Diagnosis not present

## 2015-06-04 DIAGNOSIS — Z794 Long term (current) use of insulin: Secondary | ICD-10-CM | POA: Diagnosis not present

## 2015-06-04 DIAGNOSIS — Z8659 Personal history of other mental and behavioral disorders: Secondary | ICD-10-CM | POA: Insufficient documentation

## 2015-06-04 DIAGNOSIS — L299 Pruritus, unspecified: Secondary | ICD-10-CM | POA: Diagnosis present

## 2015-06-04 DIAGNOSIS — R109 Unspecified abdominal pain: Secondary | ICD-10-CM | POA: Insufficient documentation

## 2015-06-04 DIAGNOSIS — R52 Pain, unspecified: Secondary | ICD-10-CM

## 2015-06-04 LAB — I-STAT CHEM 8, ED
BUN: 25 mg/dL — ABNORMAL HIGH (ref 6–20)
CALCIUM ION: 1.13 mmol/L (ref 1.13–1.30)
CHLORIDE: 97 mmol/L — AB (ref 101–111)
Creatinine, Ser: 1.5 mg/dL — ABNORMAL HIGH (ref 0.61–1.24)
Glucose, Bld: 469 mg/dL — ABNORMAL HIGH (ref 65–99)
HCT: 33 % — ABNORMAL LOW (ref 39.0–52.0)
Hemoglobin: 11.2 g/dL — ABNORMAL LOW (ref 13.0–17.0)
Potassium: 3.9 mmol/L (ref 3.5–5.1)
Sodium: 132 mmol/L — ABNORMAL LOW (ref 135–145)
TCO2: 24 mmol/L (ref 0–100)

## 2015-06-04 LAB — CBC WITH DIFFERENTIAL/PLATELET
Basophils Absolute: 0 10*3/uL (ref 0.0–0.1)
Basophils Relative: 0 % (ref 0–1)
Eosinophils Absolute: 0.3 10*3/uL (ref 0.0–0.7)
Eosinophils Relative: 5 % (ref 0–5)
HCT: 29.2 % — ABNORMAL LOW (ref 39.0–52.0)
Hemoglobin: 9.9 g/dL — ABNORMAL LOW (ref 13.0–17.0)
Lymphocytes Relative: 24 % (ref 12–46)
Lymphs Abs: 1.2 10*3/uL (ref 0.7–4.0)
MCH: 28.5 pg (ref 26.0–34.0)
MCHC: 33.9 g/dL (ref 30.0–36.0)
MCV: 84.1 fL (ref 78.0–100.0)
Monocytes Absolute: 0.4 10*3/uL (ref 0.1–1.0)
Monocytes Relative: 8 % (ref 3–12)
NEUTROS ABS: 3.1 10*3/uL (ref 1.7–7.7)
Neutrophils Relative %: 63 % (ref 43–77)
Platelets: 173 10*3/uL (ref 150–400)
RBC: 3.47 MIL/uL — ABNORMAL LOW (ref 4.22–5.81)
RDW: 14.5 % (ref 11.5–15.5)
WBC: 5 10*3/uL (ref 4.0–10.5)

## 2015-06-04 LAB — CBG MONITORING, ED: GLUCOSE-CAPILLARY: 398 mg/dL — AB (ref 65–99)

## 2015-06-04 MED ORDER — GLIPIZIDE 5 MG PO TABS: 5.0000 mg | ORAL_TABLET | Freq: Two times a day (BID) | ORAL | Status: DC

## 2015-06-04 MED ORDER — GLIPIZIDE 5 MG PO TABS
5.0000 mg | ORAL_TABLET | Freq: Every day | ORAL | Status: DC
  Administered 2015-06-04: 5 mg via ORAL
  Filled 2015-06-04 (×2): qty 1

## 2015-06-04 MED ORDER — INSULIN ASPART 100 UNIT/ML ~~LOC~~ SOLN: 6.0000 [IU] | Freq: Three times a day (TID) | SUBCUTANEOUS | Status: DC

## 2015-06-04 MED ORDER — METOPROLOL TARTRATE 25 MG PO TABS
25.0000 mg | ORAL_TABLET | Freq: Two times a day (BID) | ORAL | Status: DC
  Administered 2015-06-04: 25 mg via ORAL
  Filled 2015-06-04: qty 1

## 2015-06-04 MED ORDER — INSULIN GLARGINE 100 UNIT/ML ~~LOC~~ SOLN
20.0000 [IU] | Freq: Once | SUBCUTANEOUS | Status: AC
  Administered 2015-06-04: 20 [IU] via SUBCUTANEOUS
  Filled 2015-06-04: qty 0.2

## 2015-06-04 MED ORDER — TRIPLE ANTIBIOTIC 5-400-5000 EX OINT: TOPICAL_OINTMENT | Freq: Four times a day (QID) | CUTANEOUS | Status: DC

## 2015-06-04 MED ORDER — ONDANSETRON 4 MG PO TBDP: ORAL_TABLET | ORAL | Status: DC

## 2015-06-04 MED ORDER — BACITRACIN ZINC 500 UNIT/GM EX OINT
1.0000 "application " | TOPICAL_OINTMENT | Freq: Two times a day (BID) | CUTANEOUS | Status: DC
  Administered 2015-06-04: 1 via TOPICAL
  Filled 2015-06-04: qty 28.35

## 2015-06-04 NOTE — ED Notes (Signed)
CBG 398 

## 2015-06-04 NOTE — ED Notes (Signed)
Pt states he needs prescriptions for all of medicine.

## 2015-06-04 NOTE — ED Notes (Signed)
Per EMS, pt has open sores on both legs, pt has paper towels around the right leg and states that he was scratching and the sores were bursting open. Pt also complains of nausea. [place] VS 170/90, HR 100, RR 18. Pt in NAD.

## 2015-06-04 NOTE — ED Provider Notes (Addendum)
CSN: [id number]     Arrival date & time 06/04/15  0358 History   First MD Initiated Contact with Patient 06/04/15 [phone]     Chief Complaint  Patient presents with  . Sore  . Pruritis     (Consider location/radiation/quality/duration/timing/severity/associated sxs/prior Treatment) HPI Comments: Pt comes in with cc of leg wound evaluation and abd pain, nausea. Pt is diabetic and homeless. He reports ending up with the skin breakdown due to scratching. Pt was bleeding from the site earlier and wanted to get them checked out. The ulcer has been present for few days now. There is no purulent discharge, no new leg  Swelling. Pt has no fevers, chills. He reports that he has some intermittent nausea and abd discomfort. Abd discomfort is diffuse, and has no provoking factor. No pain currently. Pt has not had emesis, and is requesting food.  The history is provided by the patient.    Past Medical History  Diagnosis Date  . Hypertension   . Diabetes mellitus   . Homelessness   . Periodontal disease   . Noncompliance with medications   . PTSD (post-traumatic stress disorder)   . Alcohol abuse, unspecified    History reviewed. No pertinent past surgical history. History reviewed. No pertinent family history. History  Substance Use Topics  . Smoking status: Never Smoker   . Smokeless tobacco: Not on file  . Alcohol Use: Yes     Comment: occasional, states he doesn't drink much but doesn't quanitfy     Review of Systems  Constitutional: Negative for fever, activity change and appetite change.  Respiratory: Negative for cough and shortness of breath.   Cardiovascular: Negative for chest pain.  Gastrointestinal: Positive for abdominal pain. Negative for nausea and vomiting.  Genitourinary: Negative for dysuria.  Skin: Positive for wound.      Allergies  Review of patient's allergies indicates no known allergies.  Home Medications   Prior to Admission medications   Medication Sig  Start Date End Date Taking? Authorizing Provider  glipiZIDE (GLUCOTROL) 5 MG tablet Take 1 tablet (5 mg total) by mouth 2 (two) times daily before a meal. Patient not taking: Reported on 03/01/2015 12/15/11   Richarda Overlie, MD  insulin aspart (NOVOLOG) 100 UNIT/ML injection Inject 6 Units into the skin 3 (three) times daily before meals. Patient not taking: Reported on 03/01/2015 12/15/11   Richarda Overlie, MD  neomycin-bacitracin-polymyxin (NEOSPORIN) [phone] ointment Apply topically 4 (four) times daily. 06/04/15   Derwood Kaplan, MD  ondansetron (ZOFRAN ODT) 4 MG disintegrating tablet  ODT q4 hours prn nausea/vomit Patient not taking: Reported on 05/30/2015 05/22/15   Purvis Sheffield, MD   BP 180/89 mmHg  Pulse 91  Temp(Src) 99.4 F (37.4 C) (Oral)  Resp 16  Ht  (1.854 m)  Wt 185 lb (83.915 kg)  BMI 24.41 kg/m2  SpO2 96% Physical Exam  Constitutional: He is oriented to person, place, and time. He appears well-developed and well-nourished.  HENT:  Head: Normocephalic and atraumatic.  Eyes: Conjunctivae and EOM are normal. Pupils are equal, round, and reactive to light.  Neck: Normal range of motion. Neck supple.  Cardiovascular: Normal rate, regular rhythm and normal heart sounds.   Pulmonary/Chest: Effort normal and breath sounds normal. No respiratory distress. He has no wheezes.  Abdominal: Soft. Bowel sounds are normal. He exhibits no distension. There is no tenderness.  Musculoskeletal:  Right distal tibia has a 3 cm ulcer anteriorly. There is no erythema, no drainage. Mild edema in the  legs bilaterally. + tenderness, no crepitus.  Neurological: He is alert and oriented to person, place, and time. No cranial nerve deficit. Coordination normal.  NIHSS - 0 No objective sensory deficits, Motor strength upper and lower extremity 4+ and equal Normal cerebellar exam  Skin: Skin is warm and dry.  Nursing note and vitals reviewed.   ED Course  Procedures (including critical care  time) Labs Review Labs Reviewed  CBC WITH DIFFERENTIAL/PLATELET - Abnormal; Notable for the following:    RBC 3.47 (*)    Hemoglobin 9.9 (*)    HCT 29.2 (*)    All other components within normal limits  CBG MONITORING, ED - Abnormal; Notable for the following:    Glucose-Capillary 398 (*)    All other components within normal limits  I-STAT CHEM 8, ED - Abnormal; Notable for the following:    Sodium 132 (*)    Chloride 97 (*)    BUN 25 (*)    Creatinine, Ser 1.50 (*)    Glucose, Bld 469 (*)    Hemoglobin 11.2 (*)    HCT 33.0 (*)    All other components within normal limits    Imaging Review Dg Tibia/fibula Right  06/04/2015   CLINICAL DATA:  Open sores on both legs. Nausea. Initial encounter.  EXAM: RIGHT TIBIA AND FIBULA - 2 VIEW  COMPARISON:  Right tibia/fibula radiographs performed 12/22/2011  FINDINGS: There is no evidence of osseous disruption. No osseous erosions are seen. The knee joint is grossly unremarkable in appearance, though incompletely assessed. The ankle mortise is not fully characterized but grossly unremarkable.  Mild diffuse soft tissue edema is noted.  IMPRESSION: No evidence of osseous disruption.   Electronically Signed   By: Roanna Raider M.D.   On: 06/04/2015 06:53     EKG Interpretation None      MDM   Final diagnoses:  Ulcer of leg, chronic, right, limited to breakdown of skin  Hyperglycemia without ketosis    Pt comes in with abd pain, nausea and leg pain. He has no current abd pain. No emesis. PO challenge started as the abd exam is benign and pt has passed it.  Pt has leg ulcer - Rt worse than left. There is no evidence of infection on exam. Xray is neg for free air. Labs ordered, and there is hyperglycemia, but no ketosis.  Will dress the wound. Will d.c, and give [facility] info. Return precautions on wound infection discussed. Case management consulted for the meds, as pt doesn't have insulin. He is not in DKA - his home meds have been  provided.   Derwood Kaplan, MD 06/04/15 0051  Derwood Kaplan, MD 06/04/15 1021

## 2015-06-04 NOTE — Care Management (Signed)
ED CM received consult concerning medication assistance. CM reviewed patient's record, patient has had 5 ED visits and is homeless.  Medicaid is  listed as insurance coverage. As per K. Cobb RN on [employer] A patient states, that he does not have any medications. Patient also stated, that he does not have PCP who he followed by. Referred to the Walk-in Clinic at the [facility] tomorrow June 27th  At 10 am, bring prescriptions to be filled at the [facility]. Patient also encouraged to go to the [facility] for homeless needs. Patient verbalized understanding,  Updated Evangeline Gula, patient will be discharged information included on his AVS. No further ED CM needs identified.

## 2015-06-04 NOTE — ED Notes (Signed)
Pt was given 2 bus passes-- one to get home, one to go to health and welllness clinic tomorrow at 10am.

## 2015-06-04 NOTE — Discharge Instructions (Signed)
We saw you in the ER for your WOUND and abdominal pain. All ER results are normal. Xrays look fine. Please read the instructions provided on wound care. Keep the area clean and dry, apply bacitracin ointment daily and take the medications provided. RETURN TO THE ER IF THERE IS INCREASED PAIN, REDNESS, PUS COMING OUT from the wound site.  Dressing Change A dressing is a material placed over wounds. It keeps the wound clean, dry, and protected from further injury. This provides an environment that favors wound healing.  BEFORE YOU BEGIN  Get your supplies together. Things you may need include:  Saline solution.  Flexible gauze dressing.  Medicated cream.  Tape.  Gloves.  Abdominal dressing pads.  Gauze squares.  Plastic bags.  Take pain medicine 30 minutes before the dressing change if you need it.  Take a shower before you do the first dressing change of the day. Use plastic wrap or a plastic bag to prevent the dressing from getting wet. REMOVING YOUR OLD DRESSING   Wash your hands with soap and water. Dry your hands with a clean towel.  Put on your gloves.  Remove any tape.  Carefully remove the old dressing. If the dressing sticks, you may dampen it with warm water to loosen it, or follow your caregiver's specific directions.  Remove any gauze or packing tape that is in your wound.  Take off your gloves.  Put the gloves, tape, gauze, or any packing tape into a plastic bag. CHANGING YOUR DRESSING  Open the supplies.  Take the cap off the saline solution.  Open the gauze package so that the gauze remains on the inside of the package.  Put on your gloves.  Clean your wound as told by your caregiver.  If you have been told to keep your wound dry, follow those instructions.  Your caregiver may tell you to do one or more of the following:  Pick up the gauze. Pour the saline solution over the gauze. Squeeze out the extra saline solution.  Put medicated cream  or other medicine on your wound if you have been told to do so.  Put the solution soaked gauze only in your wound, not on the skin around it.  Pack your wound loosely or as told by your caregiver.  Put dry gauze on your wound.  Put abdominal dressing pads over the dry gauze if your wet gauze soaks through.  Tape the abdominal dressing pads in place so they will not fall off. Do not wrap the tape completely around the affected part (arm, leg, abdomen).  Wrap the dressing pads with a flexible gauze dressing to secure it in place.  Take off your gloves. Put them in the plastic bag with the old dressing. Tie the bag shut and throw it away.  Keep the dressing clean and dry until your next dressing change.  Wash your hands. SEEK MEDICAL CARE IF:  Your skin around the wound looks red.  Your wound feels more tender or sore.  You see pus in the wound.  Your wound smells bad.  You have a fever.  Your skin around the wound has a rash that itches and burns.  You see black or yellow skin in your wound that was not there before.  You feel nauseous, throw up, and feel very tired. Document Released: 01/02/2005 Document Revised: 02/17/2012 Document Reviewed: 10/07/2011 [facility] Patient Information 2015 [place], [place]. This information is not intended to replace advice given to you by your health  care provider. Make sure you discuss any questions you have with your health care provider.

## 2015-06-04 NOTE — ED Notes (Signed)
Walked into room to assess patient and patient was urinating in the sink.

## 2015-07-19 ENCOUNTER — Emergency Department (HOSPITAL_COMMUNITY)
Admission: EM | Admit: 2015-07-19 | Discharge: 2015-07-19 | Disposition: A | Discharge status: Home / Self Care | Payer: Medicaid Other | Attending: Emergency Medicine | Admitting: Emergency Medicine

## 2015-07-19 ENCOUNTER — Encounter (HOSPITAL_COMMUNITY): Payer: Self-pay

## 2015-07-19 DIAGNOSIS — R112 Nausea with vomiting, unspecified: Secondary | ICD-10-CM

## 2015-07-19 DIAGNOSIS — Z79899 Other long term (current) drug therapy: Secondary | ICD-10-CM | POA: Insufficient documentation

## 2015-07-19 DIAGNOSIS — Z9119 Patient's noncompliance with other medical treatment and regimen: Secondary | ICD-10-CM | POA: Insufficient documentation

## 2015-07-19 DIAGNOSIS — R197 Diarrhea, unspecified: Secondary | ICD-10-CM | POA: Insufficient documentation

## 2015-07-19 DIAGNOSIS — Z794 Long term (current) use of insulin: Secondary | ICD-10-CM | POA: Insufficient documentation

## 2015-07-19 DIAGNOSIS — F10920 Alcohol use, unspecified with intoxication, uncomplicated: Secondary | ICD-10-CM

## 2015-07-19 DIAGNOSIS — Z590 Homelessness: Secondary | ICD-10-CM | POA: Insufficient documentation

## 2015-07-19 DIAGNOSIS — E1165 Type 2 diabetes mellitus with hyperglycemia: Secondary | ICD-10-CM | POA: Insufficient documentation

## 2015-07-19 DIAGNOSIS — Z8719 Personal history of other diseases of the digestive system: Secondary | ICD-10-CM | POA: Diagnosis not present

## 2015-07-19 DIAGNOSIS — F10120 Alcohol abuse with intoxication, uncomplicated: Secondary | ICD-10-CM | POA: Diagnosis not present

## 2015-07-19 DIAGNOSIS — I10 Essential (primary) hypertension: Secondary | ICD-10-CM | POA: Diagnosis not present

## 2015-07-19 DIAGNOSIS — R739 Hyperglycemia, unspecified: Secondary | ICD-10-CM

## 2015-07-19 LAB — CBC WITH DIFFERENTIAL/PLATELET
Basophils Absolute: 0 10*3/uL (ref 0.0–0.1)
Basophils Relative: 0 % (ref 0–1)
Eosinophils Absolute: 0.3 10*3/uL (ref 0.0–0.7)
Eosinophils Relative: 6 % — ABNORMAL HIGH (ref 0–5)
HCT: 30.6 % — ABNORMAL LOW (ref 39.0–52.0)
Hemoglobin: 9.8 g/dL — ABNORMAL LOW (ref 13.0–17.0)
LYMPHS PCT: 29 % (ref 12–46)
Lymphs Abs: 1.5 10*3/uL (ref 0.7–4.0)
MCH: 27.9 pg (ref 26.0–34.0)
MCHC: 32 g/dL (ref 30.0–36.0)
MCV: 87.2 fL (ref 78.0–100.0)
Monocytes Absolute: 0.5 10*3/uL (ref 0.1–1.0)
Monocytes Relative: 10 % (ref 3–12)
NEUTROS ABS: 2.8 10*3/uL (ref 1.7–7.7)
NEUTROS PCT: 55 % (ref 43–77)
PLATELETS: 175 10*3/uL (ref 150–400)
RBC: 3.51 MIL/uL — AB (ref 4.22–5.81)
RDW: 15.3 % (ref 11.5–15.5)
WBC: 5.1 10*3/uL (ref 4.0–10.5)

## 2015-07-19 LAB — BASIC METABOLIC PANEL
Anion gap: 10 (ref 5–15)
BUN: 32 mg/dL — ABNORMAL HIGH (ref 6–20)
CALCIUM: 8.4 mg/dL — AB (ref 8.9–10.3)
CO2: 23 mmol/L (ref 22–32)
CREATININE: 1.74 mg/dL — AB (ref 0.61–1.24)
Chloride: 104 mmol/L (ref 101–111)
GFR calc non Af Amer: 41 mL/min — ABNORMAL LOW (ref >60)
GFR, EST AFRICAN AMERICAN: 47 mL/min — AB (ref >60)
GLUCOSE: 364 mg/dL — AB (ref 65–99)
Potassium: 4 mmol/L (ref 3.5–5.1)
SODIUM: 137 mmol/L (ref 135–145)

## 2015-07-19 LAB — ETHANOL: Alcohol, Ethyl (B): <5 mg/dL (ref <5)

## 2015-07-19 LAB — CBG MONITORING, ED: GLUCOSE-CAPILLARY: 346 mg/dL — AB (ref 65–99)

## 2015-07-19 MED ORDER — PANTOPRAZOLE SODIUM 40 MG IV SOLR
40.0000 mg | Freq: Once | INTRAVENOUS | Status: AC
  Administered 2015-07-19: 40 mg via INTRAVENOUS

## 2015-07-19 MED ORDER — PANTOPRAZOLE SODIUM 40 MG IV SOLR
INTRAVENOUS | Status: AC
  Filled 2015-07-19: qty 40

## 2015-07-19 MED ORDER — ONDANSETRON HCL 4 MG/2ML IJ SOLN
INTRAMUSCULAR | Status: AC
  Filled 2015-07-19: qty 2

## 2015-07-19 MED ORDER — THIAMINE HCL 100 MG/ML IJ SOLN
Freq: Once | INTRAVENOUS | Status: AC
  Administered 2015-07-19: 05:00:00 via INTRAVENOUS
  Filled 2015-07-19: qty 1000

## 2015-07-19 MED ORDER — ONDANSETRON HCL 4 MG/2ML IJ SOLN
4.0000 mg | Freq: Once | INTRAMUSCULAR | Status: AC
  Administered 2015-07-19: 4 mg via INTRAVENOUS

## 2015-07-19 MED ORDER — HYDROCHLOROTHIAZIDE 25 MG PO TABS: 25.0000 mg | ORAL_TABLET | Freq: Every day | ORAL | Status: DC

## 2015-07-19 MED ORDER — INSULIN ASPART 100 UNIT/ML IV SOLN
10.0000 [IU] | Freq: Once | INTRAVENOUS | Status: AC
  Administered 2015-07-19: 10 [IU] via SUBCUTANEOUS
  Filled 2015-07-19: qty 0.1

## 2015-07-19 MED ORDER — GLIPIZIDE 5 MG PO TABS: 5.0000 mg | ORAL_TABLET | Freq: Two times a day (BID) | ORAL | Status: DC

## 2015-07-19 MED ORDER — INSULIN ASPART 100 UNIT/ML ~~LOC~~ SOLN: 6.0000 [IU] | Freq: Three times a day (TID) | SUBCUTANEOUS | Status: DC

## 2015-07-19 NOTE — ED Provider Notes (Signed)
CSN: [id number]     Arrival date & time 07/19/15  0347 History   First MD Initiated Contact with Patient 07/19/15 0449     Chief Complaint  Patient presents with  . Nausea and Vomiting      (Consider location/radiation/quality/duration/timing/severity/associated sxs/prior Treatment) HPI  This is a 64 year old male with a history of alcohol abuse, hypertension, diabetes and medication noncompliance. He is here with nausea, vomiting and diarrhea which he states began either 6 hours ago, 8 hours ago or 12 hours ago depending on which version he tells. He states that he was drinking alcohol yesterday afternoon about 1:30 but has not had any alcohol since he began vomiting. It is noted that his blood alcohol is 346. He was complaining of dizziness to EMS point ask about dizziness he states "no it was nausea". He is having some mild diffuse abdominal pain.  Past Medical History  Diagnosis Date  . Hypertension   . Diabetes mellitus   . Homelessness   . Periodontal disease   . Noncompliance with medications   . PTSD (post-traumatic stress disorder)   . Alcohol abuse, unspecified    History reviewed. No pertinent past surgical history. No family history on file. Social History  Substance Use Topics  . Smoking status: Never Smoker   . Smokeless tobacco: None  . Alcohol Use: Yes     Comment: occasional, states he doesn't drink much but doesn't quanitfy     Review of Systems  All other systems reviewed and are negative.   Allergies  Review of patient's allergies indicates no known allergies.  Home Medications   Prior to Admission medications   Medication Sig Start Date End Date Taking? Authorizing Provider  glipiZIDE (GLUCOTROL) 5 MG tablet Take 1 tablet (5 mg total) by mouth 2 (two) times daily before a meal. 06/04/15   Eber Hong, MD  insulin aspart (NOVOLOG) 100 UNIT/ML injection Inject 6 Units into the skin 3 (three) times daily before meals. 06/04/15   Eber Hong, MD   neomycin-bacitracin-polymyxin (NEOSPORIN) [phone] ointment Apply topically 4 (four) times daily. 06/04/15   Derwood Kaplan, MD  ondansetron (ZOFRAN ODT) 4 MG disintegrating tablet 4mg  ODT q4 hours prn nausea/vomit 06/04/15   Eber Hong, MD   BP 210/103 mmHg  Pulse 89  Temp(Src) 97.7 F (36.5 C)  Resp 18  SpO2 97%   Physical Exam  General: Well-developed, well-nourished male in no acute distress; appears older than age of record HENT: normocephalic; atraumatic Eyes: Left pupil round and reactive to light, right pupil irregular and sluggish; extraocular muscles intact Neck: supple Heart: regular rate and rhythm Lungs: clear to auscultation bilaterally Abdomen: soft; nondistended; mild diffuse tenderness; no masses or hepatosplenomegaly; bowel sounds present Extremities: No deformity; full range of motion; pulses normal; +1 edema of lower legs Neurologic: Awake, alert and oriented; motor function intact in all extremities and symmetric; no facial droop Skin: Warm and dry Psychiatric: Normal mood and affect    ED Course  Procedures (including critical care time)   MDM   Nursing notes and vitals signs, including pulse oximetry, reviewed.  Summary of this visit's results, reviewed by myself:  Labs:  Results for orders placed or performed during the hospital encounter of 07/19/15 (from the past 24 hour(s))  POC CBG, ED     Status: Abnormal   Collection Time: 07/19/15  4:03 AM  Result Value Ref Range   Glucose-Capillary 346 (H) 65 - 99 mg/dL  Ethanol     Status: None  Collection Time: 07/19/15  4:30 AM  Result Value Ref Range   Alcohol, Ethyl (B) <5 <5 mg/dL  Basic metabolic panel     Status: Abnormal   Collection Time: 07/19/15  4:30 AM  Result Value Ref Range   Sodium 137 135 - 145 mmol/L   Potassium 4.0 3.5 - 5.1 mmol/L   Chloride 104 101 - 111 mmol/L   CO2 23 22 - 32 mmol/L   Glucose, Bld 364 (H) 65 - 99 mg/dL   BUN 32 (H) 6 - 20 mg/dL   Creatinine, Ser 1.61  (H) 0.61 - 1.24 mg/dL   Calcium 8.4 (L) 8.9 - 10.3 mg/dL   GFR calc non Af Amer 41 (L) >60 mL/min   GFR calc Af Amer 47 (L) >60 mL/min   Anion gap 10 5 - 15  CBC with Differential/Platelet     Status: Abnormal   Collection Time: 07/19/15  4:30 AM  Result Value Ref Range   WBC 5.1 4.0 - 10.5 K/uL   RBC 3.51 (L) 4.22 - 5.81 MIL/uL   Hemoglobin 9.8 (L) 13.0 - 17.0 g/dL   HCT 09.6 (L) 04.5 - 40.9 %   MCV 87.2 78.0 - 100.0 fL   MCH 27.9 26.0 - 34.0 pg   MCHC 32.0 30.0 - 36.0 g/dL   RDW 81.1 91.4 - 78.2 %   Platelets 175 150 - 400 K/uL   Neutrophils Relative % 55 43 - 77 %   Neutro Abs 2.8 1.7 - 7.7 K/uL   Lymphocytes Relative 29 12 - 46 %   Lymphs Abs 1.5 0.7 - 4.0 K/uL   Monocytes Relative 10 3 - 12 %   Monocytes Absolute 0.5 0.1 - 1.0 K/uL   Eosinophils Relative 6 (H) 0 - 5 %   Eosinophils Absolute 0.3 0.0 - 0.7 K/uL   Basophils Relative 0 0 - 1 %   Basophils Absolute 0.0 0.0 - 0.1 K/uL   6:56 AM Drinking fluids without emesis after IV fluid bolus and Zofran.    [name], MD 07/19/15 [phone]

## 2015-07-19 NOTE — ED Notes (Signed)
Patient complaining of dizziness after drinking tonight.  CBG 370 per EMS

## 2015-07-19 NOTE — ED Notes (Signed)
Patient has had 2 episodes of emesis.

## 2015-07-20 ENCOUNTER — Emergency Department (HOSPITAL_COMMUNITY)
Admission: EM | Admit: 2015-07-20 | Discharge: 2015-07-20 | Discharge status: Against Medical Advice | Payer: Medicaid Other | Attending: Emergency Medicine | Admitting: Emergency Medicine

## 2015-07-20 ENCOUNTER — Encounter (HOSPITAL_COMMUNITY): Payer: Self-pay | Admitting: *Deleted

## 2015-07-20 DIAGNOSIS — F419 Anxiety disorder, unspecified: Secondary | ICD-10-CM | POA: Insufficient documentation

## 2015-07-20 DIAGNOSIS — I10 Essential (primary) hypertension: Secondary | ICD-10-CM | POA: Insufficient documentation

## 2015-07-20 DIAGNOSIS — E119 Type 2 diabetes mellitus without complications: Secondary | ICD-10-CM | POA: Insufficient documentation

## 2015-07-20 NOTE — ED Notes (Signed)
PT arrives to the ER via EMS for complaints of anxiety; pt then refused to get out of ambulance and this RN had to go out and talk to pt; pt states that he spent the night at the shelter and this afternoon he asked for a bus pass to go home; pt states that upon arrival he could not get his roommate to answer the door and that this upset pt; pt states that he became anxious after that and went across the street to call the police and the paramedics bc he was feeling anxious; pt denies SI / HI

## 2015-07-20 NOTE — ED Notes (Signed)
Pt came behind the nurses station with a cigarette and was asked to step on the other side of the counter and he got loud and said he was going outside to smoke, RN told him he couldn't smoke on this campus and he started to yell at the staff and he was then escorted out the door by [employer].

## 2015-07-26 ENCOUNTER — Emergency Department (HOSPITAL_COMMUNITY)
Admission: EM | Admit: 2015-07-26 | Discharge: 2015-07-27 | Disposition: A | Discharge status: Home / Self Care | Payer: Medicaid Other | Attending: Emergency Medicine | Admitting: Emergency Medicine

## 2015-07-26 ENCOUNTER — Encounter (HOSPITAL_COMMUNITY): Payer: Self-pay | Admitting: Emergency Medicine

## 2015-07-26 DIAGNOSIS — Z79899 Other long term (current) drug therapy: Secondary | ICD-10-CM | POA: Insufficient documentation

## 2015-07-26 DIAGNOSIS — Z8659 Personal history of other mental and behavioral disorders: Secondary | ICD-10-CM | POA: Diagnosis not present

## 2015-07-26 DIAGNOSIS — R739 Hyperglycemia, unspecified: Secondary | ICD-10-CM

## 2015-07-26 DIAGNOSIS — Z5900 Homelessness unspecified: Secondary | ICD-10-CM

## 2015-07-26 DIAGNOSIS — Z794 Long term (current) use of insulin: Secondary | ICD-10-CM | POA: Diagnosis not present

## 2015-07-26 DIAGNOSIS — E1165 Type 2 diabetes mellitus with hyperglycemia: Secondary | ICD-10-CM | POA: Insufficient documentation

## 2015-07-26 DIAGNOSIS — Z8719 Personal history of other diseases of the digestive system: Secondary | ICD-10-CM | POA: Insufficient documentation

## 2015-07-26 DIAGNOSIS — I10 Essential (primary) hypertension: Secondary | ICD-10-CM | POA: Insufficient documentation

## 2015-07-26 LAB — BASIC METABOLIC PANEL
ANION GAP: 8 (ref 5–15)
BUN: 32 mg/dL — AB (ref 6–20)
CO2: 26 mmol/L (ref 22–32)
Calcium: 8.7 mg/dL — ABNORMAL LOW (ref 8.9–10.3)
Chloride: 101 mmol/L (ref 101–111)
Creatinine, Ser: 2.11 mg/dL — ABNORMAL HIGH (ref 0.61–1.24)
GFR calc Af Amer: 37 mL/min — ABNORMAL LOW (ref >60)
GFR calc non Af Amer: 32 mL/min — ABNORMAL LOW (ref >60)
GLUCOSE: 358 mg/dL — AB (ref 65–99)
POTASSIUM: 4.7 mmol/L (ref 3.5–5.1)
Sodium: 135 mmol/L (ref 135–145)

## 2015-07-26 LAB — CBC
HEMATOCRIT: 28.1 % — AB (ref 39.0–52.0)
Hemoglobin: 9.5 g/dL — ABNORMAL LOW (ref 13.0–17.0)
MCH: 29.1 pg (ref 26.0–34.0)
MCHC: 33.8 g/dL (ref 30.0–36.0)
MCV: 85.9 fL (ref 78.0–100.0)
Platelets: 208 10*3/uL (ref 150–400)
RBC: 3.27 MIL/uL — ABNORMAL LOW (ref 4.22–5.81)
RDW: 14.9 % (ref 11.5–15.5)
WBC: 4.8 10*3/uL (ref 4.0–10.5)

## 2015-07-26 LAB — CBG MONITORING, ED: GLUCOSE-CAPILLARY: 335 mg/dL — AB (ref 65–99)

## 2015-07-26 MED ORDER — SODIUM CHLORIDE 0.9 % IV BOLUS (SEPSIS)
1000.0000 mL | Freq: Once | INTRAVENOUS | Status: AC
  Administered 2015-07-26: 1000 mL via INTRAVENOUS

## 2015-07-26 NOTE — ED Notes (Signed)
C/o hyperglycemia and abrasion to R lower leg x 1 week.  Pt to ED via [facility].

## 2015-07-27 LAB — CBG MONITORING, ED: Glucose-Capillary: 274 mg/dL — ABNORMAL HIGH (ref 65–99)

## 2015-07-27 MED ORDER — INSULIN ASPART 100 UNIT/ML ~~LOC~~ SOLN
4.0000 [IU] | Freq: Once | SUBCUTANEOUS | Status: AC
  Administered 2015-07-27: 4 [IU] via INTRAVENOUS
  Filled 2015-07-27: qty 1

## 2015-07-27 NOTE — Discharge Instructions (Signed)
Follow-up with the resources provided return here as needed.  ° ° ° °Emergency Department Resource Guide °1) Find a Doctor and Pay Out of Pocket °Although you won't have to find out who is covered by your insurance plan, it is a good idea to ask around and get recommendations. You will then need to call the office and see if the doctor you have chosen will accept you as a new patient and what types of options they offer for patients who are self-pay. Some doctors offer discounts or will set up payment plans for their patients who do not have insurance, but you will need to ask so you aren't surprised when you get to your appointment. ° °2) Contact Your Local Health Department °Not all health departments have doctors that can see patients for sick visits, but many do, so it is worth a call to see if yours does. If you don't know where your local health department is, you can check in your phone book. The CDC also has a tool to help you locate your state's health department, and many state websites also have listings of all of their local health departments. ° °3) Find a Walk-in Clinic °If your illness is not likely to be very severe or complicated, you may want to try a walk in clinic. These are popping up all over the country in pharmacies, drugstores, and shopping centers. They're usually staffed by nurse practitioners or physician assistants that have been trained to treat common illnesses and complaints. They're usually fairly quick and inexpensive. However, if you have serious medical issues or chronic medical problems, these are probably not your best option. ° °No Primary Care Doctor: °- Call Health Connect at  [phone] - they can help you locate a primary care doctor that  accepts your insurance, provides certain services, etc. °- Physician Referral Service- 1-800-533-3463 ° °Chronic Pain Problems: °Organization         Address  Phone   Notes  °[facility]  ([phone]) [phone] Patients need to  be referred by their primary care doctor.  ° °Medication Assistance: °Organization         Address  Phone   Notes  °[place] [facility] [address] °[place], [place] [address] (336) 641-8030 --Must be a resident of [place] °-- Must have NO insurance coverage whatsoever (no Medicaid/ Medicare, etc.) °-- The pt. MUST have a primary care doctor that directs their care regularly and follows them in the community °  °[facility]  (866) 331-1348   °[employer]  (888) 892-1162   ° °Agencies that provide inexpensive medical care: °Organization         Address  Phone   Notes  °[facility]  ([phone]) [phone]   °[facility]    (336) 832-7272   °[facility] [address] °[place], [place] [address] (336) 832-4777   °[facility] [address], °[place] (336) 271-4999   °[facility]    (336) 373-0678   °[facility]    (336) 272-1050   °[facility] ° [address], [place] Phone:  (336) 832-4444, Fax:  (336) 832-4440 Hours of Operation:  9 am - 6 pm, M-F.  Also accepts Medicaid/Medicare and self-pay.  °[facility] ° [address], [place] Phone: (336) [phone], Fax: (336) [phone]. Hours of Operation:  8:30 am - 5:30   pm, M-F.  Also accepts Medicaid and self-pay.  °[facility] [place] [address], [place] Phone: (336) 878-6027   °[facility] [address], [place], [place] (336)723-1848, Ext. 123 Mondays & Thursdays: 7-9 AM.  First 15 patients are seen on a first come, first serve basis. °  ° °Medicaid-accepting [place] Providers: ° °Organization         Address  Phone   Notes  °[facility] [address], [place] ([phone]) [phone] Also accepts self-pay patients.  °[facility] [address], [place] ° (336) [phone]   °[facility] [address] [place] [address], [place] (336) 288-8857   °[facility] [address] [place] [address], [place] (336) 299-7000   °Veita Bland [address], [place]  ° (336) [phone] Only accepts [place] Access Medicaid patients after they have their name applied to their card.  ° °Self-Pay (no insurance) in [place]: ° °Organization         Address  Phone   Notes  °Sickle Cell Patients, [facility] [address], [place] (336) [phone]   °[facility] [address], [place] (336) 832-4400   °[facility] ° [address], [place] (336) [phone]   °[facility]/Dr. Osei-Bonsu ° [address] [place] [address], [place] or [address], [place] ([phone]) [phone] Phone number for both [place] and [place] locations is the same.  °[facility] [address], [place] (336) [phone]   °[facility] [address] [place] [address], [place] or [address] (336) 852-7530 °(336) 878-2260   °[facility] [address], [place] (336) 350-1642, phone; (336) 294-5005, fax Sees patients 1st and 3rd Saturday of every month.  Must not qualify for public or private insurance (i.e. Medicaid, Medicare, [place] Health Choice, Veterans' Benefits) • Household income should be no more than 200% of the poverty level •The clinic cannot treat you if you are pregnant or think you are pregnant • Sexually transmitted diseases are not treated at the clinic.  ° ° °Dental Care: °Organization         Address  Phone  Notes  °[place] [facility] [address], [place] (336) 641-6152 Accepts children up to age 21 who are enrolled in Medicaid or [place] Health Choice; pregnant women with a Medicaid card; and children who have applied for Medicaid or [place] Health Choice, but were declined, whose parents can  pay a reduced fee at time of service.  °[place] [facility] [place]  [address], [place] (336) 641-7733 Accepts children up to age 21 who are enrolled in Medicaid or [place] Health Choice; pregnant women with a Medicaid card; and children who have applied for Medicaid or [place] Health Choice, but were declined, whose parents can pay a reduced fee at time of service.  °[facility] ° [address],  (336) 641-4533 Patients are seen by appointment only. Walk-ins are not accepted. [facility] will see patients 18 years of age and older. °Monday - Tuesday (8am-5pm) °Most Wednesdays (8:30-5pm) °$30 per visit, cash only  °[facility] ° [address], [place] (336) [phone] Patients are seen by appointment only. Walk-ins are not   will see patients 46 years of age and older. One Wednesday Evening (Monthly: Volunteer Based).  $30 per visit, cash only  [place]  [phone] for adults; Children under age 19, call Graduate Pediatric Dentistry at ([phone]) [phone]. Children aged 19-14, please call [phone] to request a pediatric application.  Dental services are provided in all areas of dental care including fillings, crowns and bridges, complete and partial dentures, implants, gum treatment, root canals, and extractions. Preventive care is also provided. Treatment is provided to both adults and children. Patients are selected via a lottery and there is often a waiting list.   [facility] [address] [place]  [phone] [email or web address]   [facility] [address] [place], [place] [phone], Ext. [phone] Second and Fourth Thursday of each month, opens at 6:30 AM; Clinic ends at 9 AM.  Patients are seen on a first-come first-served basis, and a limited number are seen during each clinic.   [facility]  [address] [place] [address] [place] Danker [place], [place] [phone]   Eligibility Requirements You must have lived in [place], [place], or [place] counties for at least the last three months.   You cannot be eligible for state or federal sponsored [employer], including [employer], [place], or [employer].   You generally cannot be eligible for healthcare insurance through your employer.    How to apply: Eligibility screenings are held every Tuesday and Wednesday afternoon from 1:00 pm until 4:00 pm. You do not need an appointment for the interview!  [facility] [address], [place], [place]   [place]  [place] Department  [place]  [phone]    Behavioral Health Resources in the Community: Intensive Outpatient Programs Organization         Address  Phone  Notes  [place]. [address] [place], [place] ([phone]) [phone]   [facility] [address], [place], [place]   [facility] [address] [place], [place]   [place] [address],  [place], [place] or [phone]   Substance Abuse Resources Organization         Address  Phone  Notes  [facility]  905-270-0213   [place]  [phone]   [facility] [place]   Chinita Pester  567-104-2092   Residential & Outpatient Substance Abuse Program  580-805-6376   Psychological Services Organization         Address  Phone  Notes  [facility] [place]  [place]  5017186390   [place] [address] [place] or [phone]    Mobile Crisis Teams Organization         Address  Phone  Notes  [facility], Mobile Crisis Care Unit  [phone]    [facility]  [address] [place], [place]   [place] [address], [place] [phone]    Self-Help/Support Groups Organization         Address  Phone             Notes  [place] - variety of support groups  [place] Call for more information  Narcotics Anonymous (NA), [facility] [address]  Dr, Arlean Hopping [place]  2 meetings at this location   Residential Treatment Programs Organization         Address  Phone  Notes  [facility] [address],    [place]  1-7654334061   [facility]  [address] [place] [address], [place], [place]   [place], [place] [phone] Admissions: 8am-3pm M-F  Incentives Substance [place] [address],    [place], [place] [phone]   [facility] [address] [place], [place], [place]   [facility] [address],  [place], [place]   [facility] - Intensive Outpatient [address], [place], [place]   [facility] ([place]  [place], [place] [phone] or [phone]   [facility] ([facility]) [address], [place], [place] Accepts Medicaid  [facility] [address],  [place] [phone]-(575)[phone] Substance Abuse/Addiction Treatment   [facility] Organization         Address  Phone  Notes  [facility]  [phone]   Domenic Schwab, PhD [address] Arlis Porta [place], [place]   [phone] or [phone]   [facility] [place]oss[place] [address] [place], [place] [phone]   [facility] [address] [place], [place] [phone] Insurance/Medicaid/sponsorship through [facility] and Families [address], [address]                                     [place], [place] [phone] [place] [address], [place] [phone]    Dr. Adele Schilder  [phone]   [facility] Dept. 1) [address], [place] 2) [place] 3)  [place] [address], [place] [phone]  [phone]   [place] [phone] or [phone] (After Hours)

## 2015-07-27 NOTE — ED Notes (Signed)
Wound care provided to lower right wounds as a request per the pt.

## 2015-07-27 NOTE — ED Provider Notes (Signed)
CSN: [id number]     Arrival date & time 07/26/15  2255 History   First MD Initiated Contact with Patient 07/26/15 2313     Chief Complaint  Patient presents with  . Hyperglycemia     (Consider location/radiation/quality/duration/timing/severity/associated sxs/prior Treatment) HPI Patient presents to the emergency department with hyperglycemia, which is a chronic issue for the patient since he is noncompliant with his medications.  The patient states that he also has an abrasion to his lower leg.  It has been there for over a week.  Patient states that he is not sure how it happened.  Patient, states she has not been very compliant with his insulin.  The patient states that he has been intermittently using his insulin.  The patient denies chest pain, shortness breath, hallucinations, nausea, vomiting, weakness, dizziness, headache, blurred vision, back pain, neck pain, fever, cough, sore throat, diarrhea, or syncope.  Patient states that he did not take any other medications prior to arrival Past Medical History  Diagnosis Date  . Hypertension   . Diabetes mellitus   . Homelessness   . Periodontal disease   . Noncompliance with medications   . PTSD (post-traumatic stress disorder)   . Alcohol abuse, unspecified    History reviewed. No pertinent past surgical history. No family history on file. Social History  Substance Use Topics  . Smoking status: Never Smoker   . Smokeless tobacco: None  . Alcohol Use: Yes     Comment: occasional, states he doesn't drink much but doesn't quanitfy     Review of Systems   All other systems negative except as documented in the HPI. All pertinent positives and negatives as reviewed in the HPI. Allergies  Review of patient's allergies indicates no known allergies.  Home Medications   Prior to Admission medications   Medication Sig Start Date End Date Taking? Authorizing Provider  glipiZIDE (GLUCOTROL) 5 MG tablet Take 1 tablet (5 mg total) by  mouth 2 (two) times daily before a meal. 07/19/15   [name], MD  hydrochlorothiazide (HYDRODIURIL) 25 MG tablet Take 1 tablet (25 mg total) by mouth daily. 07/19/15   [name], MD  insulin aspart (NOVOLOG) 100 UNIT/ML injection Inject 6 Units into the skin 3 (three) times daily before meals. 07/19/15   [name], MD   BP 188/103 mmHg  Pulse 92  Temp(Src) 98.6 F (37 C) (Oral)  Resp 12  SpO2 100% Physical Exam  Constitutional: He is oriented to person, place, and time.  HENT:  Head: Normocephalic and atraumatic.  Mouth/Throat: Oropharynx is clear and moist.  Cardiovascular: Normal rate, regular rhythm and normal heart sounds.  Exam reveals no gallop and no friction rub.   No murmur heard. Pulmonary/Chest: Effort normal and breath sounds normal. No respiratory distress.  Musculoskeletal:       Legs: Neurological: He is alert and oriented to person, place, and time. He exhibits normal muscle tone. Coordination normal.  Skin: Skin is warm and dry. No rash noted. No erythema.  Nursing note and vitals reviewed.   ED Course  Procedures (including critical care time) Labs Review Labs Reviewed  BASIC METABOLIC PANEL - Abnormal; Notable for the following:    Glucose, Bld 358 (*)    BUN 32 (*)    Creatinine, Ser 2.11 (*)    Calcium 8.7 (*)    GFR calc non Af Amer 32 (*)    GFR calc Af Amer 37 (*)    All other components within normal limits  CBC - Abnormal; Notable for the following:    RBC 3.27 (*)    Hemoglobin 9.5 (*)    HCT 28.1 (*)    All other components within normal limits  CBG MONITORING, ED - Abnormal; Notable for the following:    Glucose-Capillary 335 (*)    All other components within normal limits  URINALYSIS, ROUTINE W REFLEX MICROSCOPIC (NOT AT [facility])  CBG MONITORING, ED  CBG MONITORING, ED    Imaging Review No results found. I have personally reviewed and evaluated these images and lab results as part of my medical decision-making.   Patient will be  given IV fluids.  He stable.  Otherwise, patient's here chronically for multiple different complaints   Charlestine Night, PA-C 07/27/15 0109  Layla Maw Ward, DO 07/27/15 1610

## 2015-07-31 ENCOUNTER — Encounter (HOSPITAL_COMMUNITY): Payer: Self-pay | Admitting: Emergency Medicine

## 2015-07-31 ENCOUNTER — Emergency Department (HOSPITAL_COMMUNITY)
Admission: EM | Admit: 2015-07-31 | Discharge: 2015-07-31 | Disposition: A | Discharge status: Home / Self Care | Payer: Medicaid Other | Attending: Emergency Medicine | Admitting: Emergency Medicine

## 2015-07-31 ENCOUNTER — Telehealth: Payer: Self-pay

## 2015-07-31 DIAGNOSIS — IMO0001 Reserved for inherently not codable concepts without codable children: Secondary | ICD-10-CM

## 2015-07-31 DIAGNOSIS — Z5900 Homelessness unspecified: Secondary | ICD-10-CM

## 2015-07-31 DIAGNOSIS — I10 Essential (primary) hypertension: Secondary | ICD-10-CM | POA: Diagnosis not present

## 2015-07-31 DIAGNOSIS — Z8659 Personal history of other mental and behavioral disorders: Secondary | ICD-10-CM | POA: Diagnosis not present

## 2015-07-31 DIAGNOSIS — E1165 Type 2 diabetes mellitus with hyperglycemia: Secondary | ICD-10-CM

## 2015-07-31 DIAGNOSIS — R739 Hyperglycemia, unspecified: Secondary | ICD-10-CM | POA: Diagnosis present

## 2015-07-31 DIAGNOSIS — E119 Type 2 diabetes mellitus without complications: Secondary | ICD-10-CM | POA: Diagnosis not present

## 2015-07-31 DIAGNOSIS — Z8719 Personal history of other diseases of the digestive system: Secondary | ICD-10-CM | POA: Diagnosis not present

## 2015-07-31 DIAGNOSIS — L97919 Non-pressure chronic ulcer of unspecified part of right lower leg with unspecified severity: Secondary | ICD-10-CM | POA: Diagnosis not present

## 2015-07-31 DIAGNOSIS — Z794 Long term (current) use of insulin: Secondary | ICD-10-CM | POA: Diagnosis not present

## 2015-07-31 DIAGNOSIS — Z9114 Patient's other noncompliance with medication regimen: Secondary | ICD-10-CM

## 2015-07-31 LAB — URINALYSIS, ROUTINE W REFLEX MICROSCOPIC
BILIRUBIN URINE: NEGATIVE
Ketones, ur: NEGATIVE mg/dL
LEUKOCYTES UA: NEGATIVE
NITRITE: NEGATIVE
PH: 6 (ref 5.0–8.0)
Protein, ur: >300 mg/dL — AB
SPECIFIC GRAVITY, URINE: 1.019 (ref 1.005–1.030)
Urobilinogen, UA: 0.2 mg/dL (ref 0.0–1.0)

## 2015-07-31 LAB — CBC WITH DIFFERENTIAL/PLATELET
BASOS ABS: 0 10*3/uL (ref 0.0–0.1)
Basophils Relative: 1 % (ref 0–1)
Eosinophils Absolute: 0.2 10*3/uL (ref 0.0–0.7)
Eosinophils Relative: 6 % — ABNORMAL HIGH (ref 0–5)
HEMATOCRIT: 30.6 % — AB (ref 39.0–52.0)
Hemoglobin: 10.2 g/dL — ABNORMAL LOW (ref 13.0–17.0)
Lymphocytes Relative: 21 % (ref 12–46)
Lymphs Abs: 0.9 10*3/uL (ref 0.7–4.0)
MCH: 29.3 pg (ref 26.0–34.0)
MCHC: 33.3 g/dL (ref 30.0–36.0)
MCV: 87.9 fL (ref 78.0–100.0)
MONO ABS: 0.4 10*3/uL (ref 0.1–1.0)
Monocytes Relative: 9 % (ref 3–12)
NEUTROS ABS: 2.8 10*3/uL (ref 1.7–7.7)
Neutrophils Relative %: 63 % (ref 43–77)
Platelets: 246 10*3/uL (ref 150–400)
RBC: 3.48 MIL/uL — ABNORMAL LOW (ref 4.22–5.81)
RDW: 15.2 % (ref 11.5–15.5)
WBC: 4.4 10*3/uL (ref 4.0–10.5)

## 2015-07-31 LAB — BASIC METABOLIC PANEL
ANION GAP: 4 — AB (ref 5–15)
BUN: 28 mg/dL — AB (ref 6–20)
CALCIUM: 8.5 mg/dL — AB (ref 8.9–10.3)
CO2: 24 mmol/L (ref 22–32)
Chloride: 107 mmol/L (ref 101–111)
Creatinine, Ser: 1.58 mg/dL — ABNORMAL HIGH (ref 0.61–1.24)
GFR calc Af Amer: 53 mL/min — ABNORMAL LOW (ref >60)
GFR calc non Af Amer: 46 mL/min — ABNORMAL LOW (ref >60)
GLUCOSE: 386 mg/dL — AB (ref 65–99)
Potassium: 4.1 mmol/L (ref 3.5–5.1)
Sodium: 135 mmol/L (ref 135–145)

## 2015-07-31 LAB — URINE MICROSCOPIC-ADD ON

## 2015-07-31 LAB — CBG MONITORING, ED
GLUCOSE-CAPILLARY: 369 mg/dL — AB (ref 65–99)
Glucose-Capillary: 281 mg/dL — ABNORMAL HIGH (ref 65–99)

## 2015-07-31 MED ORDER — SODIUM CHLORIDE 0.9 % IV BOLUS (SEPSIS): 500.0000 mL | Freq: Once | INTRAVENOUS | Status: DC

## 2015-07-31 MED ORDER — HYDROCHLOROTHIAZIDE 12.5 MG PO CAPS
25.0000 mg | ORAL_CAPSULE | Freq: Once | ORAL | Status: AC
  Administered 2015-07-31: 25 mg via ORAL
  Filled 2015-07-31: qty 2

## 2015-07-31 MED ORDER — INSULIN ASPART 100 UNIT/ML IV SOLN
6.0000 [IU] | Freq: Once | INTRAVENOUS | Status: DC
  Filled 2015-07-31: qty 0.06

## 2015-07-31 MED ORDER — HYDROCHLOROTHIAZIDE 25 MG PO TABS: 25.0000 mg | ORAL_TABLET | Freq: Every day | ORAL | Status: DC

## 2015-07-31 MED ORDER — INSULIN ASPART 100 UNIT/ML ~~LOC~~ SOLN
5.0000 [IU] | Freq: Once | SUBCUTANEOUS | Status: AC
  Administered 2015-07-31: 5 [IU] via SUBCUTANEOUS
  Filled 2015-07-31: qty 1

## 2015-07-31 NOTE — Care Management Note (Addendum)
Case Management Note  Patient Details  Name: Clayton [name] MRN: [id number] Date of Birth: 05/15/1951  Subjective/Objective:           64 yr old male living in [place] HE IS NOT HOMELESS CM verified his address in EPIC Pt states that is not number Pt has a payee, Kendrick [place] in [employer] [place] who he has not seen in "two months" Reports his [employer] [place] [address]  CM found various numbers and entered one for pt in D/c summary. Pt has medicaid [place] access and pcp [facility] [place] Reports not going to pcp in months Pt report he has "caught there bus there and back"   Pt claims he lives with a male who is related to "cletus who work for social services"  Pt states he has lost his keys to his apartment (after been offered 3 sets) and the male he stays with locks him out at intervals.  Pt does not have contact numbers for Earlie Counts nor his roommate Pt born in [place] States he left cause parents argued too much Has a sister in [place], sister in [place] (plans moving to [place]) and a brother living in [place].  Pt awaiting Payee contact "today" to get money for food and medicine [facility] not have a program to offer medication assistance to medicaid patients who have already discounted medications Pt would not let CNA get his BP after bringing him a sandwich and drink Cm obtained his d/c vs.  Pt apologized to CNA prior to leaving Pt informed CM he was sorry he "got emotional"   Action/Plan: Cm consulted with [facility] Pt is not a candidate with medicaid and listed pcp. Pt [facility] with Daryll Drown Cm gave pt medicaid transport number, Rev Johnson number and address, Possible number for [employer] and number for DSS to reach cletus Discussed local libraries for assist with internet, phone calls.  Encouraged pt to follow up with medications, to get numbers for payee and cletus and to get new key Pt follow appt in d/c instructions Cm  request pt show all d/c instructions to cletus or mr brown CM discussed non compliance and encouraged pt to be compliant He states he will attempt to be more compliant "and get my priorities straight" Discussed 9 ED visits listed in [facility] and Cm ability to see all [facility] encounters   Expected Discharge Date:    July 31 2015               Expected Discharge Plan:    home with resources and bus pass  In-House Referral:   na  Discharge planning Services   home with resources and bus pass   Status of Service:   completed    Additional Comments:  Ophelia Shoulder, RN 07/31/2015, 10:56 AM

## 2015-07-31 NOTE — Clinical Social Work Note (Signed)
Clinical Social Work Assessment  Patient Details  Name: Clayton [name] MRN: [id number] Date of Birth: [birth year]  Date of referral:  07/31/15               Reason for consult:  Housing Concerns/Homelessness, Financial Concerns                Permission sought to share information with:  Other (payee Luetta Nutting, [place] DSS) Permission granted to share information::  Yes, Verbal Permission Granted  Name::     [place]::  [facility] DSS-[place]  Relationship::  Payee  Contact Information:  unknown  Housing/Transportation Living arrangements for the past 2 months:  Apartment Source of Information:  Patient Patient Interpreter Needed:  None Criminal Activity/Legal Involvement Pertinent to Current Situation/Hospitalization:  No - Comment as needed Significant Relationships:  [place] Lives with:    Do you feel safe going back to the place where you live?  Yes Need for family participation in patient care:  No (Coment)  Care giving concerns:  CSW received consult for patient homelessness, medication needs, specifically insulin and blood pressure.   Social Worker assessment / plan:  CSW met with pt at bedside to complete psychosocial assessment. Patient shared that he currently resides in an apartment and is not homeless. Pt states that he does not have any insulin or blood pressure medication. Patient states that he has a payee Luetta Nutting, who lives in [place], (previous church member) who has been his payee for sometime. Pt states he has not gotten any money from his payee in the past 2 months and that is why he hasn't had any medications. Pt states that Luetta Nutting was supposed to be coming to his apartment today to provide the money. Patient states that [place], who works at [employer], is the International aid/development worker in overseeing the apartment and would be the one to tell ben Owens Shark that patient is in the hospital. However, [place] works at [employer] and unsure how to contact [employer] or [employer].  Patient states Luetta Nutting is his payee due to personal relationship not as part of an agency.   CSW attempted to reach cleidus at [facility] [phone] and left message on main line requesting assistance.   Pt pending further medical evaluation to determine disposition at this time. CSW to check back in with patient to continue to work on resources.   Employment status:    Insurance information:  Medicaid In [place] PT Recommendations:  Not assessed at this time Information / Referral to community resources:     Patient/Family's Response  to care: Patient appreciative of csw willingness to help situation. Patient knows he will need to follow up with his payee regarding funds for medications. CSW still trying to obtain contact information for [facility] brown-payee.  Patient/Family's Understanding of and Emotional Response to Diagnosis, Current Treatment, and Prognosis:   Pt is accepting of medical care needing for blood pressure and diabettes.   Emotional Assessment Appearance:  Appears stated age Attitude/Demeanor/Rapport:   (calm and coopeartive) Affect (typically observed):  Accepting Orientation:  Oriented to Self, Oriented to Place, Oriented to  Time, Oriented to Situation Alcohol / Substance use:  Not Applicable Psych involvement (Current and /or in the community):  No (Comment)  Discharge Needs  Concerns to be addressed:  Basic Needs, Financial / Insurance Concerns Readmission within the last 30 days:  No Current discharge risk:  None Barriers to Discharge:  Other (payee/medication )   Clayton [name], [place], LCSW 07/31/2015,  8:11 AM

## 2015-07-31 NOTE — Telephone Encounter (Signed)
Message received from Marval Regal, CM requesting follow up care for the patient.  A visit was scheduled for hospital follow up /priamry care at the sickle cell center on 08/04/15 @ 1300.  The patient can also come to the [facility] as a walk in for [employer] and/or [employer] assistance on Tuesdays at 0900 - the first 10 applicants are seen or on Fridays  At 0830  - the first 20 applicants are seen.    Marval Regal, CM informed of the appointment.

## 2015-07-31 NOTE — Progress Notes (Signed)
After further review, patient lives in an apartment, [facility] home. ([facility] use to be a working assisted living however was closed 5 years ago) Patient claims that he hasn't had medications due to transportation issues, money issues, later claims he has money, and then claims that he has a payee that doe snot come except every 2 months and does not know contact information. CSW to make APS report to follow up with patient regarding payee and not receiving funds.    Olga Coaster, LCSW  Clinical Social Work  [employer] [phone]

## 2015-07-31 NOTE — Discharge Instructions (Signed)
If you were given medicines take as directed.  If you are on coumadin or contraceptives realize their levels and effectiveness is altered by many different medicines.  If you have any reaction (rash, tongues swelling, other) to the medicines stop taking and see a physician.    If your blood pressure was elevated in the ER make sure you follow up for management with a primary doctor or return for chest pain, shortness of breath or stroke symptoms.  Please follow up as directed and return to the ER or see a physician for new or worsening symptoms.  Thank you. Filed Vitals:   07/31/15 0657 07/31/15 0702 07/31/15 0941  BP:  201/110 177/100  Pulse:  85 78  Temp:  97.5 F (36.4 C)   TempSrc:  Oral   Resp:  21 18  Height:   (1.803 m)   Weight:  185 lb (83.915 kg)   SpO2: 98% 100% 100%

## 2015-07-31 NOTE — ED Provider Notes (Signed)
CSN: [id number]     Arrival date & time 07/31/15  0654 History   First MD Initiated Contact with Patient 07/31/15 0710     Chief Complaint  Patient presents with  . Hyperglycemia    Patient [id number]. Patient is homeless and has been without insulin for a few days. Pateints BP is high due to not taking High blood pressure medications.     (Consider location/radiation/quality/duration/timing/severity/associated sxs/prior Treatment) HPI Comments: 64 year old male with neuropathy, diabetes uncontrolled, alcohol abuse, homeless however says is currently living in an apartment with a roommate, reflux presents with worsening blood pressure and glucose levels since stopping taking his medicines this week. Patient will not give specific reasons, discussed financial issues and patient says primarily because he doesn't have a ride to get his medicines. Patient denies any chest pain shortness of breath or other symptoms. Patient has a nonhealing ulcer on the right leg that is similar to previous. Patient denies fevers or chills. Patient is supposed to take hydrochlorothiazide is also not been taking this for some time.  Patient is a 64 y.o. male presenting with hyperglycemia. The history is provided by the patient.  Hyperglycemia Associated symptoms: no abdominal pain, no chest pain, no dysuria, no fever, no shortness of breath and no vomiting     Past Medical History  Diagnosis Date  . Hypertension   . Diabetes mellitus   . Homelessness   . Periodontal disease   . Noncompliance with medications   . PTSD (post-traumatic stress disorder)   . Alcohol abuse, unspecified    History reviewed. No pertinent past surgical history. History reviewed. No pertinent family history. Social History  Substance Use Topics  . Smoking status: Never Smoker   . Smokeless tobacco: None  . Alcohol Use: Yes     Comment: occasional, states he doesn't drink much but doesn't quanitfy     Review of Systems   Constitutional: Negative for fever and chills.  HENT: Negative for congestion.   Eyes: Negative for visual disturbance.  Respiratory: Negative for shortness of breath.   Cardiovascular: Negative for chest pain.  Gastrointestinal: Negative for vomiting and abdominal pain.  Genitourinary: Negative for dysuria and flank pain.  Musculoskeletal: Negative for back pain, neck pain and neck stiffness.  Skin: Positive for wound. Negative for rash.  Neurological: Negative for light-headedness and headaches.      Allergies  Review of patient's allergies indicates no known allergies.  Home Medications   Prior to Admission medications   Medication Sig Start Date End Date Taking? Authorizing Provider  glipiZIDE (GLUCOTROL) 5 MG tablet Take 1 tablet (5 mg total) by mouth 2 (two) times daily before a meal. Patient not taking: Reported on 07/31/2015 07/19/15   [name], MD  hydrochlorothiazide (HYDRODIURIL) 25 MG tablet Take 1 tablet (25 mg total) by mouth daily. 07/31/15   [name], MD  insulin aspart (NOVOLOG) 100 UNIT/ML injection Inject 6 Units into the skin 3 (three) times daily before meals. Patient not taking: Reported on 07/31/2015 07/19/15   [name], MD   BP 177/100 mmHg  Pulse 78  Temp(Src) 97.5 F (36.4 C) (Oral)  Resp 18  Ht 5\' 11"  (1.803 m)  Wt 185 lb (83.915 kg)  BMI 25.81 kg/m2  SpO2 100% Physical Exam  Constitutional: He is oriented to person, place, and time. He appears well-developed. Distressed: patient has cold wet feet due to wet socks and overall decreased care for self.  HENT:  Head: Normocephalic and atraumatic.  Eyes: Right eye exhibits  no discharge. Left eye exhibits no discharge.  Neck: Normal range of motion. Neck supple. No tracheal deviation present.  Cardiovascular: Normal rate and regular rhythm.   Pulmonary/Chest: Effort normal and breath sounds normal.  Abdominal: Soft. He exhibits no distension. There is no tenderness. There is no guarding.   Musculoskeletal: He exhibits no edema.  Neurological: He is alert and oriented to person, place, and time.  Skin: Skin is warm. No rash noted.  Patient has superficial ulceration right anterior tibia and no signs of active infection or drainage, pink discoloration centrally. Patient has dirty bandages around it.  Psychiatric: He has a normal mood and affect.  Nursing note and vitals reviewed.   ED Course  Procedures (including critical care time) Labs Review Labs Reviewed  CBC WITH DIFFERENTIAL/PLATELET - Abnormal; Notable for the following:    RBC 3.48 (*)    Hemoglobin 10.2 (*)    HCT 30.6 (*)    Eosinophils Relative 6 (*)    All other components within normal limits  BASIC METABOLIC PANEL - Abnormal; Notable for the following:    Glucose, Bld 386 (*)    BUN 28 (*)    Creatinine, Ser 1.58 (*)    Calcium 8.5 (*)    GFR calc non Af Amer 46 (*)    GFR calc Af Amer 53 (*)    Anion gap 4 (*)    All other components within normal limits  URINALYSIS, ROUTINE W REFLEX MICROSCOPIC (NOT AT [facility]) - Abnormal; Notable for the following:    Glucose, UA >1000 (*)    Hgb urine dipstick MODERATE (*)    Protein, ur >300 (*)    All other components within normal limits  CBG MONITORING, ED - Abnormal; Notable for the following:    Glucose-Capillary 369 (*)    All other components within normal limits  CBG MONITORING, ED - Abnormal; Notable for the following:    Glucose-Capillary 281 (*)    All other components within normal limits  URINE MICROSCOPIC-ADD ON    Imaging Review No results found. I have personally reviewed and evaluated these images and lab results as part of my medical decision-making.   EKG Interpretation None      MDM   Final diagnoses:  Diabetes mellitus type 2, uncontrolled, without complications  Homelessness  Noncompliance with medication regimen  Essential hypertension   Patient with homeless/social difficulties presents with uncontrolled diabetes  uncontrolled high blood pressure. No signs or symptoms of endorgan damage patient admits she has not taken his medicines for some time. Plan for social work consult, insulin IV fluids, blood work and likely close outpatient follow-up. Case management and social work assisted with outpatient follow-up/medications. Patient well-appearing the ER. Results and differential diagnosis were discussed with the patient/parent/guardian. Xrays were independently reviewed by myself.  Close follow up outpatient was discussed, comfortable with the plan.   Medications  hydrochlorothiazide (MICROZIDE) capsule 25 mg (25 mg Oral Given 07/31/15 0802)  insulin aspart (novoLOG) injection 5 Units (5 Units Subcutaneous Given 07/31/15 0846)    Filed Vitals:   07/31/15 1610 07/31/15 0702 07/31/15 0941  BP:  201/110 177/100  Pulse:  85 78  Temp:  97.5 F (36.4 C)   TempSrc:  Oral   Resp:  21 18  Height:  5\' 11"  (1.803 m)   Weight:  185 lb (83.915 kg)   SpO2: 98% 100% 100%    Final diagnoses:  Diabetes mellitus type 2, uncontrolled, without complications  Homelessness  Noncompliance with medication  regimen  Essential hypertension      [name], MD 07/31/15 [phone]

## 2015-07-31 NOTE — ED Notes (Signed)
Patient Clayton [name]. Patient is homeless and has been without insulin for a few days. Pateints BP is high due to not taking High blood pressure medications.

## 2015-07-31 NOTE — Progress Notes (Signed)
pcp is [facility] [address] [place], [place] [address] [phone]  

## 2015-07-31 NOTE — ED Notes (Signed)
Pt inquiring about breakfast. EDP Zavitz made aware of pt's request. HOLD for now. PO fluids for now. Pt given fluids. Pt declining to intake any more fluids at this time.

## 2015-07-31 NOTE — ED Notes (Signed)
PO FLUIDS GIVEN 

## 2015-07-31 NOTE — ED Notes (Signed)
SOCIAL WORK AT BS 

## 2015-07-31 NOTE — ED Notes (Signed)
Pt given a sandwich and a bus pass. Verbalized understanding discharge instructions. In no acute distress.

## 2015-07-31 NOTE — ED Notes (Signed)
Attempt x 2 not successful. EDP Zavitz made aware. Medications changed to PO. IV team on HOLD

## 2015-08-01 NOTE — Progress Notes (Signed)
CSW attempted 2x to make APS report. CSW left message.   Olga Coaster, LCSW  Clinical Social Work  [employer] [phone]

## 2015-08-02 ENCOUNTER — Emergency Department (HOSPITAL_COMMUNITY)
Admission: EM | Admit: 2015-08-02 | Discharge: 2015-08-02 | Discharge status: Against Medical Advice | Payer: Medicaid Other | Attending: Emergency Medicine | Admitting: Emergency Medicine

## 2015-08-02 ENCOUNTER — Encounter (HOSPITAL_COMMUNITY): Payer: Self-pay | Admitting: Physical Medicine and Rehabilitation

## 2015-08-02 DIAGNOSIS — I10 Essential (primary) hypertension: Secondary | ICD-10-CM | POA: Diagnosis not present

## 2015-08-02 DIAGNOSIS — Z79899 Other long term (current) drug therapy: Secondary | ICD-10-CM | POA: Insufficient documentation

## 2015-08-02 DIAGNOSIS — M79662 Pain in left lower leg: Secondary | ICD-10-CM | POA: Insufficient documentation

## 2015-08-02 DIAGNOSIS — Z794 Long term (current) use of insulin: Secondary | ICD-10-CM | POA: Diagnosis not present

## 2015-08-02 DIAGNOSIS — Z9119 Patient's noncompliance with other medical treatment and regimen: Secondary | ICD-10-CM | POA: Insufficient documentation

## 2015-08-02 DIAGNOSIS — Z590 Homelessness: Secondary | ICD-10-CM | POA: Diagnosis not present

## 2015-08-02 DIAGNOSIS — Z9114 Patient's other noncompliance with medication regimen: Secondary | ICD-10-CM

## 2015-08-02 DIAGNOSIS — E119 Type 2 diabetes mellitus without complications: Secondary | ICD-10-CM | POA: Diagnosis not present

## 2015-08-02 DIAGNOSIS — N289 Disorder of kidney and ureter, unspecified: Secondary | ICD-10-CM | POA: Insufficient documentation

## 2015-08-02 DIAGNOSIS — M79606 Pain in leg, unspecified: Secondary | ICD-10-CM

## 2015-08-02 LAB — CBC WITH DIFFERENTIAL/PLATELET
Basophils Absolute: 0 10*3/uL (ref 0.0–0.1)
Basophils Relative: 1 % (ref 0–1)
EOS ABS: 0.2 10*3/uL (ref 0.0–0.7)
Eosinophils Relative: 5 % (ref 0–5)
HEMATOCRIT: 30.1 % — AB (ref 39.0–52.0)
HEMOGLOBIN: 10.1 g/dL — AB (ref 13.0–17.0)
LYMPHS ABS: 1.3 10*3/uL (ref 0.7–4.0)
Lymphocytes Relative: 30 % (ref 12–46)
MCH: 28.8 pg (ref 26.0–34.0)
MCHC: 33.6 g/dL (ref 30.0–36.0)
MCV: 85.8 fL (ref 78.0–100.0)
MONO ABS: 0.4 10*3/uL (ref 0.1–1.0)
MONOS PCT: 9 % (ref 3–12)
NEUTROS PCT: 55 % (ref 43–77)
Neutro Abs: 2.4 10*3/uL (ref 1.7–7.7)
Platelets: 243 10*3/uL (ref 150–400)
RBC: 3.51 MIL/uL — ABNORMAL LOW (ref 4.22–5.81)
RDW: 14.7 % (ref 11.5–15.5)
WBC: 4.3 10*3/uL (ref 4.0–10.5)

## 2015-08-02 LAB — URINALYSIS, ROUTINE W REFLEX MICROSCOPIC
BILIRUBIN URINE: NEGATIVE
Glucose, UA: >1000 mg/dL — AB
KETONES UR: NEGATIVE mg/dL
Leukocytes, UA: NEGATIVE
NITRITE: NEGATIVE
PROTEIN: 100 mg/dL — AB
Specific Gravity, Urine: 1.013 (ref 1.005–1.030)
UROBILINOGEN UA: 0.2 mg/dL (ref 0.0–1.0)
pH: 6.5 (ref 5.0–8.0)

## 2015-08-02 LAB — COMPREHENSIVE METABOLIC PANEL
ALK PHOS: 141 U/L — AB (ref 38–126)
ALT: 44 U/L (ref 17–63)
ANION GAP: 10 (ref 5–15)
AST: 33 U/L (ref 15–41)
Albumin: 2.8 g/dL — ABNORMAL LOW (ref 3.5–5.0)
BILIRUBIN TOTAL: 0.5 mg/dL (ref 0.3–1.2)
BUN: 32 mg/dL — ABNORMAL HIGH (ref 6–20)
CALCIUM: 8.8 mg/dL — AB (ref 8.9–10.3)
CO2: 22 mmol/L (ref 22–32)
Chloride: 101 mmol/L (ref 101–111)
Creatinine, Ser: 1.33 mg/dL — ABNORMAL HIGH (ref 0.61–1.24)
GFR calc non Af Amer: 56 mL/min — ABNORMAL LOW (ref >60)
Glucose, Bld: 395 mg/dL — ABNORMAL HIGH (ref 65–99)
Potassium: 4.1 mmol/L (ref 3.5–5.1)
Sodium: 133 mmol/L — ABNORMAL LOW (ref 135–145)
TOTAL PROTEIN: 6 g/dL — AB (ref 6.5–8.1)

## 2015-08-02 LAB — URINE MICROSCOPIC-ADD ON

## 2015-08-02 LAB — SEDIMENTATION RATE: SED RATE: 55 mm/h — AB (ref 0–16)

## 2015-08-02 LAB — CBG MONITORING, ED: GLUCOSE-CAPILLARY: 391 mg/dL — AB (ref 65–99)

## 2015-08-02 MED ORDER — IBUPROFEN 800 MG PO TABS
ORAL_TABLET | ORAL | Status: AC
  Administered 2015-08-02: 17:00:00
  Filled 2015-08-02: qty 1

## 2015-08-02 NOTE — ED Provider Notes (Signed)
CSN: [id number]     Arrival date & time 08/02/15  1244 History   First MD Initiated Contact with Patient 08/02/15 1520     Chief Complaint  Patient presents with  . Foot Pain     (Consider location/radiation/quality/duration/timing/severity/associated sxs/prior Treatment) Patient is a 64 y.o. male presenting with lower extremity pain. The history is provided by the patient.  Foot Pain  He states that he is been having pain in his left lower leg for the last 3 days. Pain is sharp and he rates it at 9/10. He brings it on a pinched nerve. Nothing makes it better nothing makes it worse. He states that both legs are numb distal to the knee and this is been long-standing. He had been at was a long [facility] 2 days ago and was given prescriptions but states that he could not get them filled because of money that he was supposed to get never arrived. He denies fever or chills or sweats. He denies chest pain. Denies nausea or vomiting.  Past Medical History  Diagnosis Date  . Hypertension   . Diabetes mellitus   . Homelessness   . Periodontal disease   . Noncompliance with medications   . PTSD (post-traumatic stress disorder)   . Alcohol abuse, unspecified    History reviewed. No pertinent past surgical history. No family history on file. Social History  Substance Use Topics  . Smoking status: Never Smoker   . Smokeless tobacco: None  . Alcohol Use: Yes    Review of Systems  All other systems reviewed and are negative.     Allergies  Review of patient's allergies indicates no known allergies.  Home Medications   Prior to Admission medications   Medication Sig Start Date End Date Taking? Authorizing Provider  glipiZIDE (GLUCOTROL) 5 MG tablet Take 1 tablet (5 mg total) by mouth 2 (two) times daily before a meal. Patient not taking: Reported on 07/31/2015 07/19/15   [name], MD  hydrochlorothiazide (HYDRODIURIL) 25 MG tablet Take 1 tablet (25 mg total) by mouth daily.  07/31/15   [name], MD  insulin aspart (NOVOLOG) 100 UNIT/ML injection Inject 6 Units into the skin 3 (three) times daily before meals. Patient not taking: Reported on 07/31/2015 07/19/15   [name], MD   BP 189/118 mmHg  Pulse 81  Temp(Src) 98.2 F (36.8 C) (Oral)  Resp 18  SpO2 100% Physical Exam  Nursing note and vitals reviewed.  64 year old male, resting comfortably and in no acute distress. Vital signs are significant for hypertension. Oxygen saturation is 100%, which is normal. Head is normocephalic and atraumatic. PERRLA, EOMI. Oropharynx is clear. Neck is nontender and supple without adenopathy or JVD. Back is nontender and there is no CVA tenderness. Lungs are clear without rales, wheezes, or rhonchi. Chest is nontender. Heart has regular rate and rhythm without murmur. Abdomen is soft, flat, nontender without masses or hepatosplenomegaly and peristalsis is normoactive. Extremities: There is mild erythema and slight warmth of both lower legs. Shallow ulceration is present in the anterior aspect of the right lower leg with minimal drainage which does not appear. Aunt. There is also a shallow ulcer on the left lower leg which is not draining. No lesions are seen in the feet. Skin is warm and dry without rash. Neurologic: Mental status is normal, cranial nerves are intact, there are no motor deficits. There is markedly decreased pinprick sensation of both legs distal to the knee.  ED Course  Procedures (  including critical care time) Labs Review Results for orders placed or performed during the hospital encounter of 08/02/15  CBC with Differential  Result Value Ref Range   WBC 4.3 4.0 - 10.5 K/uL   RBC 3.51 (L) 4.22 - 5.81 MIL/uL   Hemoglobin 10.1 (L) 13.0 - 17.0 g/dL   HCT 40.9 (L) 81.1 - 91.4 %   MCV 85.8 78.0 - 100.0 fL   MCH 28.8 26.0 - 34.0 pg   MCHC 33.6 30.0 - 36.0 g/dL   RDW 78.2 95.6 - 21.3 %   Platelets 243 150 - 400 K/uL   Neutrophils Relative % 55 43 -  77 %   Neutro Abs 2.4 1.7 - 7.7 K/uL   Lymphocytes Relative 30 12 - 46 %   Lymphs Abs 1.3 0.7 - 4.0 K/uL   Monocytes Relative 9 3 - 12 %   Monocytes Absolute 0.4 0.1 - 1.0 K/uL   Eosinophils Relative 5 0 - 5 %   Eosinophils Absolute 0.2 0.0 - 0.7 K/uL   Basophils Relative 1 0 - 1 %   Basophils Absolute 0.0 0.0 - 0.1 K/uL  Comprehensive metabolic panel  Result Value Ref Range   Sodium 133 (L) 135 - 145 mmol/L   Potassium 4.1 3.5 - 5.1 mmol/L   Chloride 101 101 - 111 mmol/L   CO2 22 22 - 32 mmol/L   Glucose, Bld 395 (H) 65 - 99 mg/dL   BUN 32 (H) 6 - 20 mg/dL   Creatinine, Ser 0.86 (H) 0.61 - 1.24 mg/dL   Calcium 8.8 (L) 8.9 - 10.3 mg/dL   Total Protein 6.0 (L) 6.5 - 8.1 g/dL   Albumin 2.8 (L) 3.5 - 5.0 g/dL   AST 33 15 - 41 U/L   ALT 44 17 - 63 U/L   Alkaline Phosphatase 141 (H) 38 - 126 U/L   Total Bilirubin 0.5 0.3 - 1.2 mg/dL   GFR calc non Af Amer 56 (L) >60 mL/min   GFR calc Af Amer >60 >60 mL/min   Anion gap 10 5 - 15  Urinalysis, Routine w reflex microscopic (not at [facility])  Result Value Ref Range   Color, Urine YELLOW YELLOW   APPearance CLEAR CLEAR   Specific Gravity, Urine 1.013 1.005 - 1.030   pH 6.5 5.0 - 8.0   Glucose, UA >1000 (A) NEGATIVE mg/dL   Hgb urine dipstick SMALL (A) NEGATIVE   Bilirubin Urine NEGATIVE NEGATIVE   Ketones, ur NEGATIVE NEGATIVE mg/dL   Protein, ur 578 (A) NEGATIVE mg/dL   Urobilinogen, UA 0.2 0.0 - 1.0 mg/dL   Nitrite NEGATIVE NEGATIVE   Leukocytes, UA NEGATIVE NEGATIVE  Sedimentation rate  Result Value Ref Range   Sed Rate 55 (H) 0 - 16 mm/hr  Urine microscopic-add on  Result Value Ref Range   Squamous Epithelial / LPF RARE RARE   RBC / HPF 3-6 <3 RBC/hpf   Bacteria, UA RARE RARE  POC CBG, ED  Result Value Ref Range   Glucose-Capillary 391 (H) 65 - 99 mg/dL   I have personally reviewed and evaluated these lab results as part of my medical decision-making.   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 Old records are reviewed and he was seen in the emergency department 2 days ago and was seen by case management who was supposed to make arrangements to  have him get his medications. Apparently, he has not followed through with this. Screening labs are obtained to see if he will need inpatient management for his cellulitis.  WBC has come back normal but sedimentation rate was moderately elevated at 55. Mild elevation of creatinine is unchanged from baseline. When I went back to discuss results with him, he was found to have left AGAINST MEDICAL ADVICE.  [name], MD 08/04/15 Lyda Jester

## 2015-08-02 NOTE — ED Notes (Signed)
Spoke with pt is regards to his pain medications Ibuprofen 800 mg 1 tab.  Pt. Requested 3 tablets of Ibuprofen, explained to him that we gave him 1 tablet of 800 mg  .  Pt. Stated, "I take 3 tablets at home.".  Explained to pt. That we gave 1 tablet that is 800 mg; and Over the counter Ibuprofen is 200 mg tablets.  Pt. Verbalized understanding.  He stated, "Okay can I have cookies."  Pt. Was given crackers .

## 2015-08-02 NOTE — ED Notes (Signed)
This RN called nurse first to ask about cookies for the pt, nurse first stated that there were no cookies up front for the pt.

## 2015-08-02 NOTE — ED Notes (Signed)
Pt found at nursing station. Pt was in his own clothes stating that he was going to leave. Pt refused further evaluation, refused to wait for the doctor to review his results with him, refused discharge. PT then threw his hospital gown on the floor and refused to pick it up.This RN checked with Erick AD RN that it was ok to escort pt to waiting room. This RN then escorted the pt to the waiting room.

## 2015-08-02 NOTE — ED Notes (Signed)
Pt refused CBG and lab work.

## 2015-08-02 NOTE — ED Notes (Signed)
Pt presents to department for evaluation of L foot pain and hyperglycemia. Was seen at [facility] for same recently. CBG 447 per [facility]. Pt is alert and oriented x4.

## 2015-08-02 NOTE — ED Notes (Signed)
Holey R. RN gave pt second warmed up [place] sandwich.

## 2015-08-02 NOTE — ED Notes (Signed)
Pt refusing IV and blood work. Has refused phlebotomy twice.

## 2015-08-04 ENCOUNTER — Ambulatory Visit: Payer: Medicaid Other | Admitting: Family Medicine

## 2015-08-16 ENCOUNTER — Observation Stay (HOSPITAL_COMMUNITY)
Admission: EM | Admit: 2015-08-16 | Discharge: 2015-08-18 | DRG: 638 | Discharge status: Against Medical Advice | Payer: Medicaid Other | Attending: Internal Medicine | Admitting: Internal Medicine

## 2015-08-16 ENCOUNTER — Emergency Department (HOSPITAL_COMMUNITY): Payer: Medicaid Other

## 2015-08-16 ENCOUNTER — Encounter (HOSPITAL_COMMUNITY): Payer: Self-pay | Admitting: Emergency Medicine

## 2015-08-16 DIAGNOSIS — Z794 Long term (current) use of insulin: Secondary | ICD-10-CM

## 2015-08-16 DIAGNOSIS — N183 Chronic kidney disease, stage 3 (moderate): Secondary | ICD-10-CM | POA: Diagnosis present

## 2015-08-16 DIAGNOSIS — F4310 Post-traumatic stress disorder, unspecified: Secondary | ICD-10-CM | POA: Diagnosis present

## 2015-08-16 DIAGNOSIS — E1122 Type 2 diabetes mellitus with diabetic chronic kidney disease: Secondary | ICD-10-CM | POA: Diagnosis present

## 2015-08-16 DIAGNOSIS — R748 Abnormal levels of other serum enzymes: Secondary | ICD-10-CM | POA: Diagnosis present

## 2015-08-16 DIAGNOSIS — E871 Hypo-osmolality and hyponatremia: Secondary | ICD-10-CM | POA: Diagnosis not present

## 2015-08-16 DIAGNOSIS — Z833 Family history of diabetes mellitus: Secondary | ICD-10-CM

## 2015-08-16 DIAGNOSIS — E860 Dehydration: Secondary | ICD-10-CM | POA: Diagnosis not present

## 2015-08-16 DIAGNOSIS — I129 Hypertensive chronic kidney disease with stage 1 through stage 4 chronic kidney disease, or unspecified chronic kidney disease: Secondary | ICD-10-CM | POA: Diagnosis present

## 2015-08-16 DIAGNOSIS — Z590 Homelessness: Secondary | ICD-10-CM

## 2015-08-16 DIAGNOSIS — R7989 Other specified abnormal findings of blood chemistry: Secondary | ICD-10-CM

## 2015-08-16 DIAGNOSIS — I10 Essential (primary) hypertension: Secondary | ICD-10-CM

## 2015-08-16 DIAGNOSIS — D649 Anemia, unspecified: Secondary | ICD-10-CM | POA: Diagnosis present

## 2015-08-16 DIAGNOSIS — R778 Other specified abnormalities of plasma proteins: Secondary | ICD-10-CM

## 2015-08-16 DIAGNOSIS — F1010 Alcohol abuse, uncomplicated: Secondary | ICD-10-CM | POA: Diagnosis not present

## 2015-08-16 DIAGNOSIS — R0602 Shortness of breath: Secondary | ICD-10-CM | POA: Diagnosis not present

## 2015-08-16 DIAGNOSIS — N289 Disorder of kidney and ureter, unspecified: Secondary | ICD-10-CM

## 2015-08-16 DIAGNOSIS — Z79899 Other long term (current) drug therapy: Secondary | ICD-10-CM | POA: Diagnosis not present

## 2015-08-16 DIAGNOSIS — R739 Hyperglycemia, unspecified: Secondary | ICD-10-CM

## 2015-08-16 DIAGNOSIS — N179 Acute kidney failure, unspecified: Secondary | ICD-10-CM | POA: Diagnosis present

## 2015-08-16 DIAGNOSIS — T148XXA Other injury of unspecified body region, initial encounter: Secondary | ICD-10-CM

## 2015-08-16 DIAGNOSIS — E1165 Type 2 diabetes mellitus with hyperglycemia: Secondary | ICD-10-CM | POA: Diagnosis present

## 2015-08-16 DIAGNOSIS — Z9114 Patient's other noncompliance with medication regimen: Secondary | ICD-10-CM | POA: Diagnosis not present

## 2015-08-16 DIAGNOSIS — N189 Chronic kidney disease, unspecified: Secondary | ICD-10-CM

## 2015-08-16 LAB — URINALYSIS, ROUTINE W REFLEX MICROSCOPIC
BILIRUBIN URINE: NEGATIVE
KETONES UR: NEGATIVE mg/dL
Leukocytes, UA: NEGATIVE
Nitrite: NEGATIVE
Specific Gravity, Urine: 1.025 (ref 1.005–1.030)
UROBILINOGEN UA: 0.2 mg/dL (ref 0.0–1.0)
pH: 5.5 (ref 5.0–8.0)

## 2015-08-16 LAB — BASIC METABOLIC PANEL
ANION GAP: 10 (ref 5–15)
BUN: 28 mg/dL — AB (ref 6–20)
CALCIUM: 8.5 mg/dL — AB (ref 8.9–10.3)
CO2: 23 mmol/L (ref 22–32)
CREATININE: 1.71 mg/dL — AB (ref 0.61–1.24)
Chloride: 93 mmol/L — ABNORMAL LOW (ref 101–111)
GFR calc Af Amer: 48 mL/min — ABNORMAL LOW (ref >60)
GFR, EST NON AFRICAN AMERICAN: 41 mL/min — AB (ref >60)
GLUCOSE: 574 mg/dL — AB (ref 65–99)
Potassium: 4.1 mmol/L (ref 3.5–5.1)
Sodium: 126 mmol/L — ABNORMAL LOW (ref 135–145)

## 2015-08-16 LAB — CBC
HCT: 29.6 % — ABNORMAL LOW (ref 39.0–52.0)
HEMOGLOBIN: 10.1 g/dL — AB (ref 13.0–17.0)
MCH: 29.6 pg (ref 26.0–34.0)
MCHC: 34.1 g/dL (ref 30.0–36.0)
MCV: 86.8 fL (ref 78.0–100.0)
PLATELETS: 171 10*3/uL (ref 150–400)
RBC: 3.41 MIL/uL — ABNORMAL LOW (ref 4.22–5.81)
RDW: 14.7 % (ref 11.5–15.5)
WBC: 5 10*3/uL (ref 4.0–10.5)

## 2015-08-16 LAB — I-STAT VENOUS BLOOD GAS, ED
Bicarbonate: 24.4 mEq/L — ABNORMAL HIGH (ref 20.0–24.0)
O2 SAT: 75 %
TCO2: 26 mmol/L (ref 0–100)
pCO2, Ven: 36.7 mmHg — ABNORMAL LOW (ref 45.0–50.0)
pH, Ven: 7.431 — ABNORMAL HIGH (ref 7.250–7.300)
pO2, Ven: 38 mmHg (ref 30.0–45.0)

## 2015-08-16 LAB — CBG MONITORING, ED: GLUCOSE-CAPILLARY: 518 mg/dL — AB (ref 65–99)

## 2015-08-16 LAB — URINE MICROSCOPIC-ADD ON

## 2015-08-16 LAB — TROPONIN I: Troponin I: 0.04 ng/mL — ABNORMAL HIGH (ref <0.031)

## 2015-08-16 MED ORDER — SODIUM CHLORIDE 0.9 % IV BOLUS (SEPSIS)
1000.0000 mL | Freq: Once | INTRAVENOUS | Status: AC
  Administered 2015-08-17: 1000 mL via INTRAVENOUS

## 2015-08-16 MED ORDER — SODIUM CHLORIDE 0.9 % IV BOLUS (SEPSIS)
1000.0000 mL | Freq: Once | INTRAVENOUS | Status: AC
  Administered 2015-08-16: 1000 mL via INTRAVENOUS

## 2015-08-16 MED ORDER — INSULIN ASPART 100 UNIT/ML ~~LOC~~ SOLN
10.0000 [IU] | Freq: Once | SUBCUTANEOUS | Status: AC
  Administered 2015-08-16: 10 [IU] via INTRAVENOUS
  Filled 2015-08-16: qty 1

## 2015-08-16 MED ORDER — ONDANSETRON HCL 4 MG/2ML IJ SOLN
4.0000 mg | Freq: Once | INTRAMUSCULAR | Status: AC
  Administered 2015-08-16: 4 mg via INTRAVENOUS
  Filled 2015-08-16: qty 2

## 2015-08-16 NOTE — ED Notes (Signed)
Pt finally let Rosendo Gros RN attempt last time for IV which was unsuccessful. Pt being somewhat apologetic but still saying he does not want an IV. Importance of access once again explained to pt. Lab currently at bedside attempting to get blood

## 2015-08-16 NOTE — ED Notes (Signed)
Pt back from CT. Pt asking for more food, told pt that no food would be provided secondary to CBG being elevated.

## 2015-08-16 NOTE — ED Notes (Signed)
Pt being verbally abusive towards ED staff, refusing to let nurses stick to get an IV, procedure explained to pt and importance of having IV access.

## 2015-08-16 NOTE — ED Notes (Signed)
Pt to CT

## 2015-08-16 NOTE — ED Notes (Signed)
Pt in EMS from home, reports generalized weakness past 8 days, headache, blurred vision, frequent urination, hypertensive, hyperglycemic, CBG 597 en route, BP 186/108

## 2015-08-16 NOTE — ED Notes (Signed)
Pt requesting crackers at this time. EDP notified. Pt informed that food cannot be given at this time secondary to CBG being elevated. Pt yelling at nurse because of this.

## 2015-08-16 NOTE — ED Notes (Signed)
1 IV attempt by Rosendo Gros RN and 1 IV attempt by [facility] RN.

## 2015-08-16 NOTE — ED Provider Notes (Signed)
CSN: [id number]     Arrival date & time 08/16/15  2121 History   First MD Initiated Contact with Patient 08/16/15 2128     Chief Complaint  Patient presents with  . Hyperglycemia  . Hypertension  . Weakness     (Consider location/radiation/quality/duration/timing/severity/associated sxs/prior Treatment) HPI Comments: Pt comes in with c/o headache, blurred vision, dizziness and frequent urination for the last 8 days. He states that he doesn't take any medications for his htn or diabetes in the last year. Denies cp. States that he has sob. He states that he feel several days ago and got an abrasion to his left lower leg.he states that the symptoms are worsening so he decided to come in today  The history is provided by the patient. No language interpreter was used.    Past Medical History  Diagnosis Date  . Hypertension   . Diabetes mellitus   . Homelessness   . Periodontal disease   . Noncompliance with medications   . PTSD (post-traumatic stress disorder)   . Alcohol abuse, unspecified    History reviewed. No pertinent past surgical history. No family history on file. Social History  Substance Use Topics  . Smoking status: Never Smoker   . Smokeless tobacco: None  . Alcohol Use: Yes    Review of Systems  All other systems reviewed and are negative.     Allergies  Review of patient's allergies indicates no known allergies.  Home Medications   Prior to Admission medications   Medication Sig Start Date End Date Taking? Authorizing Provider  glipiZIDE (GLUCOTROL) 5 MG tablet Take 1 tablet (5 mg total) by mouth 2 (two) times daily before a meal. Patient not taking: Reported on 07/31/2015 07/19/15   [name], MD  hydrochlorothiazide (HYDRODIURIL) 25 MG tablet Take 1 tablet (25 mg total) by mouth daily. 07/31/15   [name], MD  insulin aspart (NOVOLOG) 100 UNIT/ML injection Inject 6 Units into the skin 3 (three) times daily before meals. Patient not taking: Reported  on 07/31/2015 07/19/15   [name], MD   BP 176/94 mmHg  Pulse 80  Resp 10  SpO2 98% Physical Exam  Constitutional: He appears well-developed and well-nourished.  HENT:  Head: Normocephalic and atraumatic.  Right Ear: External ear normal.  Left Ear: External ear normal.  Eyes: Conjunctivae and EOM are normal. Pupils are equal, round, and reactive to light.  Cardiovascular: Normal rate and regular rhythm.   Pulmonary/Chest: Effort normal and breath sounds normal.  Abdominal: Soft. Bowel sounds are normal. There is no tenderness.  Musculoskeletal: Normal range of motion.  Neurological: He is alert. Coordination normal.  Skin:  Abrasion noted to the left lower leg  Nursing note and vitals reviewed.   ED Course  Procedures (including critical care time) Labs Review Labs Reviewed  BASIC METABOLIC PANEL - Abnormal; Notable for the following:    Sodium 126 (*)    Chloride 93 (*)    Glucose, Bld 574 (*)    BUN 28 (*)    Creatinine, Ser 1.71 (*)    Calcium 8.5 (*)    GFR calc non Af Amer 41 (*)    GFR calc Af Amer 48 (*)    All other components within normal limits  CBC - Abnormal; Notable for the following:    RBC 3.41 (*)    Hemoglobin 10.1 (*)    HCT 29.6 (*)    All other components within normal limits  URINALYSIS, ROUTINE W REFLEX MICROSCOPIC (NOT AT  [facility]) - Abnormal; Notable for the following:    Glucose, UA >1000 (*)    Hgb urine dipstick MODERATE (*)    Protein, ur >300 (*)    All other components within normal limits  TROPONIN I - Abnormal; Notable for the following:    Troponin I 0.04 (*)    All other components within normal limits  CBG MONITORING, ED - Abnormal; Notable for the following:    Glucose-Capillary 518 (*)    All other components within normal limits  I-STAT VENOUS BLOOD GAS, ED - Abnormal; Notable for the following:    pH, Ven 7.431 (*)    pCO2, Ven 36.7 (*)    Bicarbonate 24.4 (*)    All other components within normal limits  URINE  MICROSCOPIC-ADD ON  BLOOD GAS, VENOUS  CBG MONITORING, ED    Imaging Review Dg Chest 2 View  08/16/2015   CLINICAL DATA:  Shortness of breath for 2 weeks  EXAM: CHEST  2 VIEW  COMPARISON:  12/14/2011  FINDINGS: The heart size and mediastinal contours are within normal limits. Both lungs are clear. The visualized skeletal structures are unremarkable.  IMPRESSION: No active cardiopulmonary disease.   Electronically Signed   By: Esperanza Heir M.D.   On: 08/16/2015 23:04   Ct Head Wo Contrast  08/16/2015   CLINICAL DATA:  Weakness blurred vision headache hypertension no trauma  EXAM: CT HEAD WITHOUT CONTRAST  TECHNIQUE: Contiguous axial images were obtained from the base of the skull through the vertex without intravenous contrast.  COMPARISON:  None.  FINDINGS: Mild diffuse age-related atrophy. No evidence of vascular territory infarct or mass. No hydrocephalus. No hemorrhage or extra-axial fluid. Calvarium is intact. There are multiple mucous retention cysts in both maxillary sinuses. There is scattered opacification of ethmoid air cells bilaterally.  IMPRESSION: No acute intracranial abnormality.  Chronic sinusitis.   Electronically Signed   By: Esperanza Heir M.D.   On: 08/16/2015 23:54   I have personally reviewed and evaluated these images and lab results as part of my medical decision-making.   EKG Interpretation   Date/Time:  Wednesday August 16 2015 21:32:19 EDT Ventricular Rate:  81 PR Interval:  140 QRS Duration: 145 QT Interval:  403 QTC Calculation: 468 R Axis:   73 Text Interpretation:  Sinus rhythm Probable left atrial enlargement Right  bundle branch block Confirmed by MESNER MD, Barbara Cower [phone]) on 08/16/2015  9:36:51 PM      MDM   Final diagnoses:  Hyperglycemia  Elevated troponin  Renal insufficiency  Essential hypertension  Abrasion    Think pt needs to be admitted. Pt is non compliant with his medications. No change in ekg, elevated trop    Teressa Lower, NP 08/17/15 0028  Teressa Lower, NP 08/17/15 6045  Marily Memos, MD 08/17/15 0100

## 2015-08-17 ENCOUNTER — Encounter (HOSPITAL_COMMUNITY): Payer: Self-pay | Admitting: Internal Medicine

## 2015-08-17 ENCOUNTER — Observation Stay (HOSPITAL_COMMUNITY): Payer: Medicaid Other

## 2015-08-17 DIAGNOSIS — I10 Essential (primary) hypertension: Secondary | ICD-10-CM

## 2015-08-17 DIAGNOSIS — N179 Acute kidney failure, unspecified: Secondary | ICD-10-CM | POA: Diagnosis present

## 2015-08-17 DIAGNOSIS — R0600 Dyspnea, unspecified: Secondary | ICD-10-CM | POA: Diagnosis not present

## 2015-08-17 DIAGNOSIS — R7989 Other specified abnormal findings of blood chemistry: Secondary | ICD-10-CM | POA: Diagnosis not present

## 2015-08-17 DIAGNOSIS — E1165 Type 2 diabetes mellitus with hyperglycemia: Secondary | ICD-10-CM

## 2015-08-17 DIAGNOSIS — N189 Chronic kidney disease, unspecified: Secondary | ICD-10-CM

## 2015-08-17 DIAGNOSIS — R739 Hyperglycemia, unspecified: Secondary | ICD-10-CM

## 2015-08-17 DIAGNOSIS — R531 Weakness: Secondary | ICD-10-CM | POA: Diagnosis not present

## 2015-08-17 LAB — CBG MONITORING, ED: GLUCOSE-CAPILLARY: 192 mg/dL — AB (ref 65–99)

## 2015-08-17 LAB — COMPREHENSIVE METABOLIC PANEL
ALK PHOS: 135 U/L — AB (ref 38–126)
ALT: 29 U/L (ref 17–63)
AST: 30 U/L (ref 15–41)
Albumin: 2.5 g/dL — ABNORMAL LOW (ref 3.5–5.0)
Anion gap: 6 (ref 5–15)
BILIRUBIN TOTAL: 0.4 mg/dL (ref 0.3–1.2)
BUN: 22 mg/dL — AB (ref 6–20)
CALCIUM: 8.3 mg/dL — AB (ref 8.9–10.3)
CO2: 23 mmol/L (ref 22–32)
Chloride: 105 mmol/L (ref 101–111)
Creatinine, Ser: 1.4 mg/dL — ABNORMAL HIGH (ref 0.61–1.24)
GFR calc Af Amer: >60 mL/min (ref >60)
GFR, EST NON AFRICAN AMERICAN: 53 mL/min — AB (ref >60)
GLUCOSE: 387 mg/dL — AB (ref 65–99)
POTASSIUM: 4.1 mmol/L (ref 3.5–5.1)
Sodium: 134 mmol/L — ABNORMAL LOW (ref 135–145)
TOTAL PROTEIN: 5.3 g/dL — AB (ref 6.5–8.1)

## 2015-08-17 LAB — GLUCOSE, CAPILLARY
GLUCOSE-CAPILLARY: 177 mg/dL — AB (ref 65–99)
GLUCOSE-CAPILLARY: 364 mg/dL — AB (ref 65–99)
GLUCOSE-CAPILLARY: 338 mg/dL — AB (ref 65–99)
GLUCOSE-CAPILLARY: 209 mg/dL — AB (ref 65–99)
GLUCOSE-CAPILLARY: 289 mg/dL — AB (ref 65–99)

## 2015-08-17 LAB — CBC WITH DIFFERENTIAL/PLATELET
BASOS ABS: 0 10*3/uL (ref 0.0–0.1)
BASOS PCT: 1 % (ref 0–1)
EOS ABS: 0.3 10*3/uL (ref 0.0–0.7)
EOS PCT: 8 % — AB (ref 0–5)
HCT: 29.4 % — ABNORMAL LOW (ref 39.0–52.0)
Hemoglobin: 10.1 g/dL — ABNORMAL LOW (ref 13.0–17.0)
LYMPHS PCT: 26 % (ref 12–46)
Lymphs Abs: 1 10*3/uL (ref 0.7–4.0)
MCH: 29.5 pg (ref 26.0–34.0)
MCHC: 34.4 g/dL (ref 30.0–36.0)
MCV: 86 fL (ref 78.0–100.0)
MONO ABS: 0.3 10*3/uL (ref 0.1–1.0)
Monocytes Relative: 7 % (ref 3–12)
Neutro Abs: 2.3 10*3/uL (ref 1.7–7.7)
Neutrophils Relative %: 58 % (ref 43–77)
PLATELETS: 160 10*3/uL (ref 150–400)
RBC: 3.42 MIL/uL — AB (ref 4.22–5.81)
RDW: 14.8 % (ref 11.5–15.5)
WBC: 4 10*3/uL (ref 4.0–10.5)

## 2015-08-17 LAB — RETICULOCYTES
RBC.: 3.42 MIL/uL — AB (ref 4.22–5.81)
RETIC COUNT ABSOLUTE: 65 10*3/uL (ref 19.0–186.0)
Retic Ct Pct: 1.9 % (ref 0.4–3.1)

## 2015-08-17 LAB — TROPONIN I
TROPONIN I: 0.03 ng/mL (ref <0.031)
TROPONIN I: 0.03 ng/mL (ref <0.031)

## 2015-08-17 LAB — RAPID URINE DRUG SCREEN, HOSP PERFORMED
AMPHETAMINES: NOT DETECTED
Barbiturates: NOT DETECTED
Benzodiazepines: NOT DETECTED
COCAINE: NOT DETECTED
OPIATES: NOT DETECTED
TETRAHYDROCANNABINOL: NOT DETECTED

## 2015-08-17 LAB — IRON AND TIBC
IRON: 37 ug/dL — AB (ref 45–182)
Saturation Ratios: 19 % (ref 17.9–39.5)
TIBC: 196 ug/dL — ABNORMAL LOW (ref 250–450)
UIBC: 159 ug/dL

## 2015-08-17 LAB — D-DIMER, QUANTITATIVE: D-Dimer, Quant: 0.52 ug/mL-FEU — ABNORMAL HIGH (ref 0.00–0.48)

## 2015-08-17 LAB — VITAMIN B12: VITAMIN B 12: 600 pg/mL (ref 180–914)

## 2015-08-17 LAB — BRAIN NATRIURETIC PEPTIDE: B NATRIURETIC PEPTIDE 5: 303.5 pg/mL — AB (ref 0.0–100.0)

## 2015-08-17 LAB — FERRITIN: Ferritin: 218 ng/mL (ref 24–336)

## 2015-08-17 LAB — TSH: TSH: 0.498 u[IU]/mL (ref 0.350–4.500)

## 2015-08-17 LAB — FOLATE: FOLATE: 21.2 ng/mL (ref >5.9)

## 2015-08-17 MED ORDER — ACETAMINOPHEN 325 MG PO TABS: 650.0000 mg | ORAL_TABLET | Freq: Four times a day (QID) | ORAL | Status: DC | PRN

## 2015-08-17 MED ORDER — ONDANSETRON HCL 4 MG/2ML IJ SOLN: 4.0000 mg | Freq: Four times a day (QID) | INTRAMUSCULAR | Status: DC | PRN

## 2015-08-17 MED ORDER — ONDANSETRON HCL 4 MG PO TABS: 4.0000 mg | ORAL_TABLET | Freq: Four times a day (QID) | ORAL | Status: DC | PRN

## 2015-08-17 MED ORDER — ENOXAPARIN SODIUM 40 MG/0.4ML ~~LOC~~ SOLN
40.0000 mg | Freq: Every day | SUBCUTANEOUS | Status: DC
  Administered 2015-08-17: 40 mg via SUBCUTANEOUS
  Filled 2015-08-17 (×3): qty 0.4

## 2015-08-17 MED ORDER — INSULIN GLARGINE 100 UNIT/ML ~~LOC~~ SOLN
10.0000 [IU] | Freq: Every day | SUBCUTANEOUS | Status: DC
  Administered 2015-08-17: 10 [IU] via SUBCUTANEOUS
  Filled 2015-08-17 (×2): qty 0.1

## 2015-08-17 MED ORDER — PNEUMOCOCCAL VAC POLYVALENT 25 MCG/0.5ML IJ INJ
0.5000 mL | INJECTION | INTRAMUSCULAR | Status: DC
  Filled 2015-08-17: qty 0.5

## 2015-08-17 MED ORDER — HYDRALAZINE HCL 20 MG/ML IJ SOLN
10.0000 mg | INTRAMUSCULAR | Status: DC | PRN
  Administered 2015-08-18: 10 mg via INTRAVENOUS
  Filled 2015-08-17: qty 1

## 2015-08-17 MED ORDER — INSULIN ASPART 100 UNIT/ML ~~LOC~~ SOLN
0.0000 [IU] | Freq: Three times a day (TID) | SUBCUTANEOUS | Status: DC
  Administered 2015-08-17: 9 [IU] via SUBCUTANEOUS
  Administered 2015-08-17: 3 [IU] via SUBCUTANEOUS
  Administered 2015-08-17: 7 [IU] via SUBCUTANEOUS
  Administered 2015-08-18: 9 [IU] via SUBCUTANEOUS

## 2015-08-17 MED ORDER — SODIUM CHLORIDE 0.9 % IV SOLN
INTRAVENOUS | Status: AC
  Administered 2015-08-17 (×3): via INTRAVENOUS

## 2015-08-17 MED ORDER — INFLUENZA VAC SPLIT QUAD 0.5 ML IM SUSY
0.5000 mL | PREFILLED_SYRINGE | INTRAMUSCULAR | Status: DC
  Filled 2015-08-17: qty 0.5

## 2015-08-17 MED ORDER — ASPIRIN EC 325 MG PO TBEC
325.0000 mg | DELAYED_RELEASE_TABLET | Freq: Every day | ORAL | Status: DC
  Administered 2015-08-17: 325 mg via ORAL
  Filled 2015-08-17 (×3): qty 1

## 2015-08-17 MED ORDER — ACETAMINOPHEN 650 MG RE SUPP: 650.0000 mg | Freq: Four times a day (QID) | RECTAL | Status: DC | PRN

## 2015-08-17 NOTE — Progress Notes (Signed)
Patient refused to have his troponin labs drawn.  Demichael Traum, Charlaine Dalton RN

## 2015-08-17 NOTE — Progress Notes (Signed)
Patient seen and examined. Admitted after midnight secondary to generalized weakness, dehydration, pseudo-hyponatremia, uncontrolled diabetes and SOB/elevated troponin. Patient is pain free now and feeling better after IVF's given. A1C has been ordered and the plan is to cycle troponin, check 2-D echo and repeat EKG. Second cardiac marker 0.03. Please referred to Dr. Toniann Fail H&P for further info/details on admission.  Vassie Loll [phone]

## 2015-08-17 NOTE — Progress Notes (Signed)
CSW spoke with pt about medical team concerns for homelessness.  Pt states that he is not homeless and has been staying in an apartment for the past year.  Pt confirms that he can return to this apartment at time of DC.  No other CSW needs identified.  CSW signing off.  Merlyn Lot, LCSWA Clinical Social Worker [phone]

## 2015-08-17 NOTE — H&P (Signed)
[facility] History and Physical  Clayton [name] Clayton [name] DOB: Jun 30, 1951 DOA: 08/16/2015  Referring physician: Ms. Rubin Payor. PCP: No PCP Per Patient has not been following up with any PCP. Specialists: None.  Chief Complaint: Dizziness.  HPI: Clayton [name] is a 64 y.o. male with history of diabetes mellitus type 2, hypertension, chronic kidney disease and chronic anemia who has not been taking any medications for last few years presents to the ER because of increasing dizziness. Patient in addition also has been having some exertional shortness of breath over the last few days. Denies any chest pain nausea vomiting diarrhea productive cough fever chills. Patient at this time lives with his friend who had called EMS and patient blood sugar was found to be elevated. In the ER patient's blood sugar was found to be elevated more than 500 but not in DKA. CT of the head did not show any acute inpatient was nonfocal on exam. Patient's troponin was mildly elevated. EKG did not show any new changes. Patient has been admitted for further management.    Review of Systems: As presented in the history of presenting illness, rest negative.  Past Medical History  Diagnosis Date  . Hypertension   . Diabetes mellitus   . Homelessness   . Periodontal disease   . Noncompliance with medications   . PTSD (post-traumatic stress disorder)   . Alcohol abuse, unspecified    History reviewed. No pertinent past surgical history. Social History:  reports that he has never smoked. He does not have any smokeless tobacco history on file. He reports that he drinks alcohol. He reports that he does not use illicit drugs. Where does patient live homeless. Can patient participate in ADLs? Yes.  No Known Allergies  Family History:  Family History  Problem Relation Age of Onset  . Diabetes Mellitus II Mother   . Diabetes Mellitus II Father   . Stroke Maternal Grandfather       Prior to Admission  medications   Medication Sig Start Date End Date Taking? Authorizing Provider  glipiZIDE (GLUCOTROL) 5 MG tablet Take 1 tablet (5 mg total) by mouth 2 (two) times daily before a meal. Patient not taking: Reported on 07/31/2015 07/19/15   [name], MD  hydrochlorothiazide (HYDRODIURIL) 25 MG tablet Take 1 tablet (25 mg total) by mouth daily. Patient not taking: Reported on 08/16/2015 07/31/15   [name], MD  insulin aspart (NOVOLOG) 100 UNIT/ML injection Inject 6 Units into the skin 3 (three) times daily before meals. Patient not taking: Reported on 07/31/2015 07/19/15   [name], MD    Physical Exam: Filed Vitals:   08/16/15 2124 08/16/15 2138 08/16/15 2230 08/17/15 0100  BP:  176/94 170/76 177/94  Pulse:  80 78 77  Resp:  10 22 16   SpO2: 97% 98% 99% 99%     General:  Moderately built and nourished.  Eyes: Anicteric pallor.  ENT: No discharge from the ears eyes nose more.  Neck: No mass felt. No JVD appreciated.  Cardiovascular: S1 and S2 heard.  Respiratory: No rhonchi or crepitations.  Abdomen: Soft nontender bowel sounds present.  Skin: No rash.  Musculoskeletal: No edema.  Psychiatric: Appears normal.  Neurologic: Alert awake oriented to time place and person. Most all extremities 5 x 5. No facial asymmetry. Tongue is midline. PERRLA positive.  Labs on Admission:  Basic Metabolic Panel:  Recent Labs Lab 08/16/15 2231  NA 126*  K 4.1  CL 93*  CO2 23  GLUCOSE 574*  BUN 28*  CREATININE 1.71*  CALCIUM 8.5*   Liver Function Tests: No results for input(s): AST, ALT, ALKPHOS, BILITOT, PROT, ALBUMIN in the last 168 hours. No results for input(s): LIPASE, AMYLASE in the last 168 hours. No results for input(s): AMMONIA in the last 168 hours. CBC:  Recent Labs Lab 08/16/15 2231  WBC 5.0  HGB 10.1*  HCT 29.6*  MCV 86.8  PLT 171   Cardiac Enzymes:  Recent Labs Lab 08/16/15 2231  TROPONINI 0.04*    BNP (last 3 results) No results for  input(s): BNP in the last 8760 hours.  ProBNP (last 3 results) No results for input(s): PROBNP in the last 8760 hours.  CBG:  Recent Labs Lab 08/16/15 2156  GLUCAP 518*    Radiological Exams on Admission: Dg Chest 2 View  08/16/2015   CLINICAL DATA:  Shortness of breath for 2 weeks  EXAM: CHEST  2 VIEW  COMPARISON:  12/14/2011  FINDINGS: The heart size and mediastinal contours are within normal limits. Both lungs are clear. The visualized skeletal structures are unremarkable.  IMPRESSION: No active cardiopulmonary disease.   Electronically Signed   By: Esperanza Heir M.D.   On: 08/16/2015 23:04   Ct Head Wo Contrast  08/16/2015   CLINICAL DATA:  Weakness blurred vision headache hypertension no trauma  EXAM: CT HEAD WITHOUT CONTRAST  TECHNIQUE: Contiguous axial images were obtained from the base of the skull through the vertex without intravenous contrast.  COMPARISON:  None.  FINDINGS: Mild diffuse age-related atrophy. No evidence of vascular territory infarct or mass. No hydrocephalus. No hemorrhage or extra-axial fluid. Calvarium is intact. There are multiple mucous retention cysts in both maxillary sinuses. There is scattered opacification of ethmoid air cells bilaterally.  IMPRESSION: No acute intracranial abnormality.  Chronic sinusitis.   Electronically Signed   By: Esperanza Heir M.D.   On: 08/16/2015 23:54    EKG: Independently reviewed. Normal sinus rhythm with RBBB.  Assessment/Plan Principal Problem:   Weakness generalized Active Problems:   Diabetes mellitus type 2, uncontrolled   Chronic anemia   Hypertension   CKD (chronic kidney disease) stage 3, GFR 30-59 ml/min   Weakness   1. Dizziness and weakness most likely secondary to uncontrolled diabetes with dehydration - patient was given fluid bolus in the ER and I have placed patient on normal saline infusion. Closer following the carpal metabolic panel and control blood sugar. Patient appears nonfocal. We'll get  physical therapy consult. 2. Uncontrolled diabetes mellitus type 2 secondary to noncompliance with medications - patient was given NovoLog insulin 10 units in the ER. And I have placed patient on Lantus 10 units daily with 1 dose now. Patient also has been placed on sliding scale coverage. Check hemoglobin A1c. 3. Shortness of breath on exertion with mildly elevated troponin - will check BNP today echo and d-dimer. Patient's troponin is mildly elevated we will cycle cardiac markers. Aspirin. 4. Hypertension - presently have placed patient on when necessary IV hydralazine. Closely follow blood pressure trends. 5. Chronic kidney disease stage III creatinine appears to be at baseline - closely for metabolic panel and intake output. 6. Chronic anemia - normocytic normochromic - follow CBC. Check anemia panel patient eventually will need colonoscopy as outpatient.  I have reviewed patient's old charts and labs. Personally reviewed the x-rays and EKG. Social work consult.   DVT Prophylaxis Lovenox.  Code Status: Full code.  Family Communication: Discussed with patient.  Disposition Plan: Admit to inpatient.    Clayton [name]  Clayton [name] Kitchen [facility] Pager [phone].  If 7PM-7AM, please contact night-coverage [email or web address] Password [email or web address] 08/17/2015, 1:23 AM

## 2015-08-17 NOTE — Progress Notes (Signed)
Pt a/o, no c/o pain, pt in bed requested snacks, pt compliant at this time, VSS, pt stable

## 2015-08-17 NOTE — Progress Notes (Signed)
  Echocardiogram 2D Echocardiogram has been performed.  Clayton [name] 08/17/2015, 10:48 AM

## 2015-08-17 NOTE — Progress Notes (Signed)
Went in pt' room to stop IV beeping. Pt was sitting on the bed with his gown off. Pt removed telemetry box, and ripped his IV line in half. Pt refused to put telemetry box back on. Pt states that he is aggravated and that he wants to leave. Pt was verbally aggressive to staff. Pt CBG was 209. Pt refused insulin. Charge nurse to bedside. MD notified.

## 2015-08-17 NOTE — ED Notes (Signed)
Pt shoes, two pr of socks, and shirt accompanied pt to rm at 1:51 am.

## 2015-08-18 DIAGNOSIS — N183 Chronic kidney disease, stage 3 (moderate): Secondary | ICD-10-CM

## 2015-08-18 LAB — GLUCOSE, CAPILLARY: Glucose-Capillary: 389 mg/dL — ABNORMAL HIGH (ref 65–99)

## 2015-08-18 LAB — HEMOGLOBIN A1C
Hgb A1c MFr Bld: 12.7 % — ABNORMAL HIGH (ref 4.8–5.6)
Mean Plasma Glucose: 318 mg/dL

## 2015-08-18 LAB — HIV ANTIBODY (ROUTINE TESTING W REFLEX): HIV SCREEN 4TH GENERATION: NONREACTIVE

## 2015-08-18 MED ORDER — ZOLPIDEM TARTRATE 5 MG PO TABS
5.0000 mg | ORAL_TABLET | Freq: Once | ORAL | Status: AC
  Administered 2015-08-18: 5 mg via ORAL

## 2015-08-18 NOTE — Discharge Summary (Signed)
Physician Discharge Summary  Clayton [name] NWG:[id number] DOB: Oct 09, 1951 DOA: 08/16/2015  PCP: No PCP Per Patient  Admit date: 08/16/2015 Discharge date: 08/18/2015  Time spent: 30 minutes  Recommendations for Outpatient Follow-up:  Left AMA  Discharge Diagnoses:  Principal Problem:   Weakness generalized Active Problems:   Diabetes mellitus type 2, uncontrolled   Chronic anemia   Hypertension   CKD (chronic kidney disease) stage 3, GFR 30-59 ml/min   Weakness   Discharge Condition: no CP, no SOB, no nausea, no vomiting.  Diet recommendation: Left AMA  Filed Weights   08/17/15 0409 08/18/15 0617  Weight: 82.146 kg (181 lb 1.6 oz) 83.87 kg (184 lb 14.4 oz)    History of present illness:  64 y.o. male with history of diabetes mellitus type 2, hypertension, chronic kidney disease and chronic anemia who has not been taking any medications for last few years presents to the ER because of increasing dizziness. Patient in addition also has been having some exertional shortness of breath over the last few days. Denies any chest pain nausea vomiting diarrhea productive cough fever chills. Patient at this time lives with his friend who had called EMS and patient blood sugar was found to be elevated. In the ER patient's blood sugar was found to be elevated more than 500 but not in DKA. CT of the head did not show any acute inpatient was nonfocal on exam. Patient's troponin was mildly elevated. EKG did not show any new changes.  Hospital Course:  Patient was admitted for further management of uncontrolled diabetes, dehydration and SOB with mild elevated troponin. After fluid resuscitation and insulin given. He felt better and decline any further testing. He refuse insulin treatment, further troponin cycling process and Left AMA.   Procedures:  2-D echo - Left ventricle: The cavity size was normal. Wall thickness was increased in a pattern of moderate LVH. Systolic function was normal.  The estimated ejection fraction was in the range of 55% to 60%. Wall motion was normal; there were no regional wall motion abnormalities. Doppler parameters are consistent with abnormal left ventricular relaxation (grade 1 diastolic dysfunction). - Pericardium, extracardiac: A trivial pericardial effusion was identified posterior to the heart.  Consultations:  None   Discharge Exam: Filed Vitals:   08/18/15 0617  BP: 199/98  Pulse: 77  Temp: 98.3 F (36.8 C)  Resp: 20    General: Patient left AMA. Not seen by me today. According to nursing staff, patient was abusing verbally and had no focal deficits or complaints of pain. He said "nobody can tell him what to do and that he would leave AMA''. BP was elevated and his CBG's in 240-380 this morning. He refused any kind of treatment/intervention.    Discharge Instructions    Discharge Medication List as of 08/18/2015  9:18 AM    CONTINUE these medications which have NOT CHANGED   Details  glipiZIDE (GLUCOTROL) 5 MG tablet Take 1 tablet (5 mg total) by mouth 2 (two) times daily before a meal., Starting 07/19/2015, Until Discontinued, Print    hydrochlorothiazide (HYDRODIURIL) 25 MG tablet Take 1 tablet (25 mg total) by mouth daily., Starting 07/31/2015, Until Discontinued, Print    insulin aspart (NOVOLOG) 100 UNIT/ML injection Inject 6 Units into the skin 3 (three) times daily before meals., Starting 07/19/2015, Until Discontinued, Print       No Known Allergies    The results of significant diagnostics from this hospitalization (including imaging, microbiology, ancillary and laboratory) are listed below  for reference.    Significant Diagnostic Studies: Dg Chest 2 View  08/16/2015   CLINICAL DATA:  Shortness of breath for 2 weeks  EXAM: CHEST  2 VIEW  COMPARISON:  12/14/2011  FINDINGS: The heart size and mediastinal contours are within normal limits. Both lungs are clear. The visualized skeletal structures are  unremarkable.  IMPRESSION: No active cardiopulmonary disease.   Electronically Signed   By: Esperanza Heir M.D.   On: 08/16/2015 23:04   Ct Head Wo Contrast  08/16/2015   CLINICAL DATA:  Weakness blurred vision headache hypertension no trauma  EXAM: CT HEAD WITHOUT CONTRAST  TECHNIQUE: Contiguous axial images were obtained from the base of the skull through the vertex without intravenous contrast.  COMPARISON:  None.  FINDINGS: Mild diffuse age-related atrophy. No evidence of vascular territory infarct or mass. No hydrocephalus. No hemorrhage or extra-axial fluid. Calvarium is intact. There are multiple mucous retention cysts in both maxillary sinuses. There is scattered opacification of ethmoid air cells bilaterally.  IMPRESSION: No acute intracranial abnormality.  Chronic sinusitis.   Electronically Signed   By: Esperanza Heir M.D.   On: 08/16/2015 23:54   Labs: Basic Metabolic Panel:  Recent Labs Lab 08/16/15 2231 08/17/15 0941  NA 126* 134*  K 4.1 4.1  CL 93* 105  CO2 23 23  GLUCOSE 574* 387*  BUN 28* 22*  CREATININE 1.71* 1.40*  CALCIUM 8.5* 8.3*   Liver Function Tests:  Recent Labs Lab 08/17/15 0941  AST 30  ALT 29  ALKPHOS 135*  BILITOT 0.4  PROT 5.3*  ALBUMIN 2.5*   CBC:  Recent Labs Lab 08/16/15 2231 08/17/15 0941  WBC 5.0 4.0  NEUTROABS  --  2.3  HGB 10.1* 10.1*  HCT 29.6* 29.4*  MCV 86.8 86.0  PLT 171 160   Cardiac Enzymes:  Recent Labs Lab 08/16/15 2231 08/17/15 0330 08/17/15 0941  TROPONINI 0.04* 0.03 0.03   BNP: BNP (last 3 results)  Recent Labs  08/17/15 0330  BNP 303.5*    CBG:  Recent Labs Lab 08/17/15 0638 08/17/15 1120 08/17/15 1639 08/17/15 2104 08/18/15 0616  GLUCAP 364* 338* 209* 289* 389*    Signed:  Vassie Loll  [facility] 08/18/2015, 1:47 PM

## 2015-08-18 NOTE — Progress Notes (Signed)
Sent text page to Dr. Gwenlyn Perking. Pt removed telemetry. Pt wanted IV out because he was leaving.  I asked the pt to sign out AMA and he said he "didn't have to sign anything" because he was leaving.  Pt came to desk and wanted his discharge papers to take to his doctors. I explained that he was leaving AMA and did not get any discharge papers. Pt wanted bus pass.  I explained that we did not have and he stated he would go downstairs and get one.Marland Kitchen He was not going to walk home. Pt directed to main entrance but he refused and said he would take the elevators. Thomas Hoff, RN

## 2015-09-16 ENCOUNTER — Observation Stay (HOSPITAL_COMMUNITY)
Admission: EM | Admit: 2015-09-16 | Discharge: 2015-09-18 | Discharge status: Against Medical Advice | Payer: Medicaid Other | Attending: Internal Medicine | Admitting: Internal Medicine

## 2015-09-16 ENCOUNTER — Encounter (HOSPITAL_COMMUNITY): Payer: Self-pay

## 2015-09-16 DIAGNOSIS — D649 Anemia, unspecified: Secondary | ICD-10-CM | POA: Diagnosis not present

## 2015-09-16 DIAGNOSIS — Y900 Blood alcohol level of less than 20 mg/100 ml: Secondary | ICD-10-CM | POA: Diagnosis not present

## 2015-09-16 DIAGNOSIS — E871 Hypo-osmolality and hyponatremia: Secondary | ICD-10-CM | POA: Diagnosis not present

## 2015-09-16 DIAGNOSIS — F1010 Alcohol abuse, uncomplicated: Secondary | ICD-10-CM | POA: Diagnosis not present

## 2015-09-16 DIAGNOSIS — R739 Hyperglycemia, unspecified: Secondary | ICD-10-CM | POA: Diagnosis present

## 2015-09-16 DIAGNOSIS — Z590 Homelessness: Secondary | ICD-10-CM | POA: Diagnosis not present

## 2015-09-16 DIAGNOSIS — E1165 Type 2 diabetes mellitus with hyperglycemia: Principal | ICD-10-CM | POA: Insufficient documentation

## 2015-09-16 DIAGNOSIS — E1122 Type 2 diabetes mellitus with diabetic chronic kidney disease: Secondary | ICD-10-CM | POA: Diagnosis not present

## 2015-09-16 DIAGNOSIS — F4310 Post-traumatic stress disorder, unspecified: Secondary | ICD-10-CM | POA: Insufficient documentation

## 2015-09-16 DIAGNOSIS — K219 Gastro-esophageal reflux disease without esophagitis: Secondary | ICD-10-CM | POA: Insufficient documentation

## 2015-09-16 DIAGNOSIS — E1143 Type 2 diabetes mellitus with diabetic autonomic (poly)neuropathy: Secondary | ICD-10-CM | POA: Diagnosis not present

## 2015-09-16 DIAGNOSIS — I129 Hypertensive chronic kidney disease with stage 1 through stage 4 chronic kidney disease, or unspecified chronic kidney disease: Secondary | ICD-10-CM | POA: Insufficient documentation

## 2015-09-16 DIAGNOSIS — Z9114 Patient's other noncompliance with medication regimen: Secondary | ICD-10-CM

## 2015-09-16 DIAGNOSIS — N289 Disorder of kidney and ureter, unspecified: Secondary | ICD-10-CM

## 2015-09-16 DIAGNOSIS — I951 Orthostatic hypotension: Secondary | ICD-10-CM | POA: Diagnosis not present

## 2015-09-16 DIAGNOSIS — R109 Unspecified abdominal pain: Secondary | ICD-10-CM

## 2015-09-16 DIAGNOSIS — N183 Chronic kidney disease, stage 3 (moderate): Secondary | ICD-10-CM | POA: Insufficient documentation

## 2015-09-16 DIAGNOSIS — N189 Chronic kidney disease, unspecified: Secondary | ICD-10-CM

## 2015-09-16 DIAGNOSIS — N179 Acute kidney failure, unspecified: Secondary | ICD-10-CM | POA: Insufficient documentation

## 2015-09-16 DIAGNOSIS — I10 Essential (primary) hypertension: Secondary | ICD-10-CM | POA: Diagnosis present

## 2015-09-16 DIAGNOSIS — K3184 Gastroparesis: Secondary | ICD-10-CM | POA: Insufficient documentation

## 2015-09-16 DIAGNOSIS — E1142 Type 2 diabetes mellitus with diabetic polyneuropathy: Secondary | ICD-10-CM | POA: Insufficient documentation

## 2015-09-16 LAB — CBG MONITORING, ED: Glucose-Capillary: 596 mg/dL (ref 65–99)

## 2015-09-16 MED ORDER — INSULIN ASPART 100 UNIT/ML ~~LOC~~ SOLN
10.0000 [IU] | Freq: Once | SUBCUTANEOUS | Status: DC
  Filled 2015-09-16: qty 1

## 2015-09-16 MED ORDER — SODIUM CHLORIDE 0.9 % IV SOLN: 1000.0000 mL | INTRAVENOUS | Status: DC

## 2015-09-16 MED ORDER — SODIUM CHLORIDE 0.9 % IV SOLN: 1000.0000 mL | Freq: Once | INTRAVENOUS | Status: DC

## 2015-09-16 NOTE — ED Provider Notes (Signed)
CSN: [id number]     Arrival date & time 09/16/15  2340 History  By signing my name below, I, Clayton [name], attest that this documentation has been prepared under the direction and in the presence of [name], MD. Electronically Signed: Phillis [name], ED Scribe. 09/16/2015. 2:30 AM.  Chief Complaint  Patient presents with  . Hyperglycemia   The history is provided by the patient. No language interpreter was used.  HPI Comments: Clayton [name] is a 64 y.o. Male with a hx of HTN, DM, homelessness, non-compliance with medications, and alcohol abuse brought in by [facility] who presents to the Emergency Department complaining of increased blood sugar onset PTA. Per EMS, pt flagged down police and told them he had "diabetic problems." They state that he complained of dizziness and their glucometer read "HIGH" when they checked the pt's CBG. Pt states that he was hospitalized last month and that is the last time he has taken his insulin. He states that he has no one to take him to go get it. Per triage note, the pt's CBG is currently 596. He reports associated nausea, vomiting, chest tightness that worsens with deep breathing, and abdominal pain. He denies cough, diarrhea and constipation.   Past Medical History  Diagnosis Date  . Hypertension   . Diabetes mellitus   . Homelessness   . Periodontal disease   . Noncompliance with medications   . PTSD (post-traumatic stress disorder)   . Alcohol abuse, unspecified    History reviewed. No pertinent past surgical history. Family History  Problem Relation Age of Onset  . Diabetes Mellitus II Mother   . Diabetes Mellitus II Father   . Stroke Maternal Grandfather    Social History  Substance Use Topics  . Smoking status: Never Smoker   . Smokeless tobacco: None  . Alcohol Use: Yes    Review of Systems  Respiratory: Positive for chest tightness. Negative for cough.   Gastrointestinal: Positive for nausea, vomiting and abdominal pain. Negative  for diarrhea and constipation.  Neurological: Positive for dizziness.  All other systems reviewed and are negative.  Allergies  Review of patient's allergies indicates no known allergies.  Home Medications   Prior to Admission medications   Medication Sig Start Date End Date Taking? Authorizing Provider  glipiZIDE (GLUCOTROL) 5 MG tablet Take 1 tablet (5 mg total) by mouth 2 (two) times daily before a meal. Patient not taking: Reported on 07/31/2015 07/19/15   [name], MD  hydrochlorothiazide (HYDRODIURIL) 25 MG tablet Take 1 tablet (25 mg total) by mouth daily. Patient not taking: Reported on 08/16/2015 07/31/15   [name], MD  insulin aspart (NOVOLOG) 100 UNIT/ML injection Inject 6 Units into the skin 3 (three) times daily before meals. Patient not taking: Reported on 07/31/2015 07/19/15   [name], MD   BP 189/102 mmHg  Pulse 87  Temp(Src) 98.4 F (36.9 C) (Oral)  Resp 22  Ht  (1.854 m)  Wt 183 lb (83.008 kg)  BMI 24.15 kg/m2  SpO2 100% Physical Exam  Constitutional: He is oriented to person, place, and time. He appears well-developed and well-nourished.  HENT:  Head: Normocephalic and atraumatic.  Eyes: EOM are normal. Pupils are equal, round, and reactive to light.  Neck: Normal range of motion. Neck supple. No JVD present.  Cardiovascular: Normal rate, regular rhythm and normal heart sounds.   No murmur heard. Pulmonary/Chest: Effort normal and breath sounds normal. He has no wheezes. He has no rales. He exhibits no  tenderness.  Abdominal: Soft. Bowel sounds are normal. He exhibits no distension and no mass. There is no tenderness.  Musculoskeletal: Normal range of motion. He exhibits no edema.  Lymphadenopathy:    He has no cervical adenopathy.  Neurological: He is alert and oriented to person, place, and time. No cranial nerve deficit. He exhibits normal muscle tone. Coordination normal.  Skin: Skin is warm and dry. No rash noted.  Psychiatric: He has a  normal mood and affect. His behavior is normal.  Nursing note and vitals reviewed.   ED Course  Procedures (including critical care time) DIAGNOSTIC STUDIES: Oxygen Saturation is 100% on RA, normal by my interpretation.    COORDINATION OF CARE: 11:59 PM-Discussed treatment plan which includes labs with pt at bedside and pt agreed to plan.   Labs Review Results for orders placed or performed during the hospital encounter of 09/16/15  Basic metabolic panel  Result Value Ref Range   Sodium 124 (L) 135 - 145 mmol/L   Potassium 3.8 3.5 - 5.1 mmol/L   Chloride 90 (L) 101 - 111 mmol/L   CO2 26 22 - 32 mmol/L   Glucose, Bld 716 (HH) 65 - 99 mg/dL   BUN 23 (H) 6 - 20 mg/dL   Creatinine, Ser 1.61 (H) 0.61 - 1.24 mg/dL   Calcium 8.5 (L) 8.9 - 10.3 mg/dL   GFR calc non Af Amer 40 (L) >60 mL/min   GFR calc Af Amer 47 (L) >60 mL/min   Anion gap 8 5 - 15  CBC  Result Value Ref Range   WBC 3.9 (L) 4.0 - 10.5 K/uL   RBC 3.69 (L) 4.22 - 5.81 MIL/uL   Hemoglobin 11.2 (L) 13.0 - 17.0 g/dL   HCT 09.6 (L) 04.5 - 40.9 %   MCV 87.5 78.0 - 100.0 fL   MCH 30.4 26.0 - 34.0 pg   MCHC 34.7 30.0 - 36.0 g/dL   RDW 81.1 91.4 - 78.2 %   Platelets 206 150 - 400 K/uL  Troponin I  Result Value Ref Range   Troponin I 0.03 <0.031 ng/mL  Differential  Result Value Ref Range   Neutrophils Relative % 58 %   Neutro Abs 2.3 1.7 - 7.7 K/uL   Lymphocytes Relative 28 %   Lymphs Abs 1.1 0.7 - 4.0 K/uL   Monocytes Relative 9 %   Monocytes Absolute 0.3 0.1 - 1.0 K/uL   Eosinophils Relative 4 %   Eosinophils Absolute 0.2 0.0 - 0.7 K/uL   Basophils Relative 1 %   Basophils Absolute 0.0 0.0 - 0.1 K/uL  Hepatic function panel  Result Value Ref Range   Total Protein 5.7 (L) 6.5 - 8.1 g/dL   Albumin 2.6 (L) 3.5 - 5.0 g/dL   AST 27 15 - 41 U/L   ALT 33 17 - 63 U/L   Alkaline Phosphatase 147 (H) 38 - 126 U/L   Total Bilirubin 0.2 (L) 0.3 - 1.2 mg/dL   Bilirubin, Direct 0.1 0.1 - 0.5 mg/dL   Indirect Bilirubin  0.1 (L) 0.3 - 0.9 mg/dL  CBG monitoring, ED  Result Value Ref Range   Glucose-Capillary 596 (HH) 65 - 99 mg/dL   Comment 1 Notify RN   I-Stat CG4 Lactic Acid, ED  Result Value Ref Range   Lactic Acid, Venous 1.27 0.5 - 2.0 mmol/L  POC CBG, ED  Result Value Ref Range   Glucose-Capillary 483 (H) 65 - 99 mg/dL   Comment 1 Notify RN  Comment 2 Document in Chart   CBG monitoring, ED  Result Value Ref Range   Glucose-Capillary 386 (H) 65 - 99 mg/dL       EKG Interpretation   Date/Time:  Saturday September 16 2015 23:47:20 EDT Ventricular Rate:  86 PR Interval:  143 QRS Duration: 155 QT Interval:  385 QTC Calculation: 460 R Axis:   88 Text Interpretation:  Sinus rhythm Left atrial enlargement Right bundle  branch block Baseline wander in lead(s) V2 When compared with ECG of  08/16/2015, No significant change was found Confirmed by [facility]  MD, Omer Puccinelli  ([id number]) on 09/16/2015 11:50:03 PM      MDM   Final diagnoses:  Hyperglycemia  Noncompliance with medication regimen  Renal insufficiency  Normochromic normocytic anemia  Orthostatic hypotension    Hyperglycemia secondary to medication noncompliance. Patient states that he was not able to get a prescription for insulin refill. He was in the hospital last month - review of old records shows that he left AGAINST MEDICAL ADVICE on September 9 and had refused medications including his insulin. At that time, he had dizziness, weakness, hyperglycemia, and elevated troponin. Patient does not appear to be in distress currently. Fingerstick blood sugars is 596. He will be started on IV fluids and will be given a dose of insulin.  But blood sugar on basic metabolic panel was actually a significant a higher at 716. After dose of insulin, and has come down to 483 and has come down further with additional insulin. IV access is not been able to be obtained and the patient was refusing additional sticks. Orthostatic vital signs were obtained and he  had dramatic drop in blood pressure from 190 12/10/1994 systolic with symptomatically dizziness. He has anemia which is unchanged from baseline and renal insufficiency which is slightly worse than baseline. I did ask the patient if he would be willing to be admitted and he is agreeable. Case is discussed with Dr. Clyde Lundborg of [facility] who agrees to admit the patient.  I, Taylormarie Register, personally performed the services described in this documentation. All medical record entries made by the scribe were at my direction and in my presence.  I have reviewed the chart and discharge instructions and agree that the record reflects my personal performance and is accurate and complete. Ebonye Reade.  09/17/2015. 12:19 AM.      [name], MD 09/17/15 [phone]

## 2015-09-16 NOTE — ED Notes (Signed)
Pts CBG 596

## 2015-09-16 NOTE — ED Notes (Signed)
Per [facility]: pt was walking down the street and flagged down police and stated that he was having diabetic problems. The pt was complaining of dizziness. [facility] states that their glucometer read "HIGH".

## 2015-09-16 NOTE — ED Notes (Signed)
Dr. Glick at bedside.  

## 2015-09-16 NOTE — ED Notes (Signed)
Pt states that the last time he took his insulin was the last time he was at the hospital,

## 2015-09-17 ENCOUNTER — Inpatient Hospital Stay (HOSPITAL_COMMUNITY): Payer: Medicaid Other

## 2015-09-17 DIAGNOSIS — K219 Gastro-esophageal reflux disease without esophagitis: Secondary | ICD-10-CM | POA: Diagnosis not present

## 2015-09-17 DIAGNOSIS — R1033 Periumbilical pain: Secondary | ICD-10-CM

## 2015-09-17 DIAGNOSIS — I951 Orthostatic hypotension: Secondary | ICD-10-CM

## 2015-09-17 DIAGNOSIS — I10 Essential (primary) hypertension: Secondary | ICD-10-CM

## 2015-09-17 DIAGNOSIS — R112 Nausea with vomiting, unspecified: Secondary | ICD-10-CM

## 2015-09-17 DIAGNOSIS — N179 Acute kidney failure, unspecified: Secondary | ICD-10-CM | POA: Diagnosis not present

## 2015-09-17 DIAGNOSIS — N183 Chronic kidney disease, stage 3 (moderate): Secondary | ICD-10-CM

## 2015-09-17 LAB — CBC
HEMATOCRIT: 32.3 % — AB (ref 39.0–52.0)
HEMOGLOBIN: 11.2 g/dL — AB (ref 13.0–17.0)
MCH: 30.4 pg (ref 26.0–34.0)
MCHC: 34.7 g/dL (ref 30.0–36.0)
MCV: 87.5 fL (ref 78.0–100.0)
Platelets: 206 10*3/uL (ref 150–400)
RBC: 3.69 MIL/uL — ABNORMAL LOW (ref 4.22–5.81)
RDW: 13.4 % (ref 11.5–15.5)
WBC: 3.9 10*3/uL — AB (ref 4.0–10.5)

## 2015-09-17 LAB — DIFFERENTIAL
Basophils Absolute: 0 10*3/uL (ref 0.0–0.1)
Basophils Relative: 1 %
EOS PCT: 4 %
Eosinophils Absolute: 0.2 10*3/uL (ref 0.0–0.7)
LYMPHS ABS: 1.1 10*3/uL (ref 0.7–4.0)
LYMPHS PCT: 28 %
Monocytes Absolute: 0.3 10*3/uL (ref 0.1–1.0)
Monocytes Relative: 9 %
NEUTROS ABS: 2.3 10*3/uL (ref 1.7–7.7)
NEUTROS PCT: 58 %

## 2015-09-17 LAB — HEPATIC FUNCTION PANEL
ALBUMIN: 2.6 g/dL — AB (ref 3.5–5.0)
ALK PHOS: 147 U/L — AB (ref 38–126)
ALT: 33 U/L (ref 17–63)
AST: 27 U/L (ref 15–41)
BILIRUBIN TOTAL: 0.2 mg/dL — AB (ref 0.3–1.2)
Bilirubin, Direct: 0.1 mg/dL (ref 0.1–0.5)
Indirect Bilirubin: 0.1 mg/dL — ABNORMAL LOW (ref 0.3–0.9)
Total Protein: 5.7 g/dL — ABNORMAL LOW (ref 6.5–8.1)

## 2015-09-17 LAB — RAPID URINE DRUG SCREEN, HOSP PERFORMED
AMPHETAMINES: NOT DETECTED
Barbiturates: NOT DETECTED
Benzodiazepines: NOT DETECTED
Cocaine: NOT DETECTED
OPIATES: NOT DETECTED
Tetrahydrocannabinol: NOT DETECTED

## 2015-09-17 LAB — URINALYSIS, ROUTINE W REFLEX MICROSCOPIC
BILIRUBIN URINE: NEGATIVE
Glucose, UA: >1000 mg/dL — AB
Ketones, ur: NEGATIVE mg/dL
Leukocytes, UA: NEGATIVE
NITRITE: NEGATIVE
SPECIFIC GRAVITY, URINE: 1.025 (ref 1.005–1.030)
UROBILINOGEN UA: 0.2 mg/dL (ref 0.0–1.0)
pH: 6 (ref 5.0–8.0)

## 2015-09-17 LAB — CBG MONITORING, ED
GLUCOSE-CAPILLARY: 339 mg/dL — AB (ref 65–99)
GLUCOSE-CAPILLARY: 130 mg/dL — AB (ref 65–99)
Glucose-Capillary: 483 mg/dL — ABNORMAL HIGH (ref 65–99)
Glucose-Capillary: 386 mg/dL — ABNORMAL HIGH (ref 65–99)

## 2015-09-17 LAB — BASIC METABOLIC PANEL
ANION GAP: 8 (ref 5–15)
BUN: 23 mg/dL — ABNORMAL HIGH (ref 6–20)
CALCIUM: 8.5 mg/dL — AB (ref 8.9–10.3)
CO2: 26 mmol/L (ref 22–32)
Chloride: 90 mmol/L — ABNORMAL LOW (ref 101–111)
Creatinine, Ser: 1.74 mg/dL — ABNORMAL HIGH (ref 0.61–1.24)
GFR, EST AFRICAN AMERICAN: 47 mL/min — AB (ref >60)
GFR, EST NON AFRICAN AMERICAN: 40 mL/min — AB (ref >60)
GLUCOSE: 716 mg/dL — AB (ref 65–99)
POTASSIUM: 3.8 mmol/L (ref 3.5–5.1)
SODIUM: 124 mmol/L — AB (ref 135–145)

## 2015-09-17 LAB — LIPASE, BLOOD: Lipase: 24 U/L (ref 22–51)

## 2015-09-17 LAB — CREATININE, URINE, RANDOM: Creatinine, Urine: 114.98 mg/dL

## 2015-09-17 LAB — GLUCOSE, CAPILLARY
Glucose-Capillary: 95 mg/dL (ref 65–99)
Glucose-Capillary: 418 mg/dL — ABNORMAL HIGH (ref 65–99)
Glucose-Capillary: 458 mg/dL — ABNORMAL HIGH (ref 65–99)

## 2015-09-17 LAB — URINE MICROSCOPIC-ADD ON

## 2015-09-17 LAB — MRSA PCR SCREENING: MRSA BY PCR: NEGATIVE

## 2015-09-17 LAB — SODIUM, URINE, RANDOM: Sodium, Ur: 36 mmol/L

## 2015-09-17 LAB — ETHANOL: Alcohol, Ethyl (B): <5 mg/dL (ref <5)

## 2015-09-17 LAB — I-STAT CG4 LACTIC ACID, ED: Lactic Acid, Venous: 1.27 mmol/L (ref 0.5–2.0)

## 2015-09-17 LAB — TROPONIN I: Troponin I: 0.03 ng/mL (ref <0.031)

## 2015-09-17 MED ORDER — LORAZEPAM 1 MG PO TABS: 1.0000 mg | ORAL_TABLET | Freq: Four times a day (QID) | ORAL | Status: DC | PRN

## 2015-09-17 MED ORDER — LORAZEPAM 2 MG/ML IJ SOLN: 0.0000 mg | Freq: Two times a day (BID) | INTRAMUSCULAR | Status: DC

## 2015-09-17 MED ORDER — SODIUM CHLORIDE 0.9 % IV SOLN
INTRAVENOUS | Status: DC
  Administered 2015-09-17 – 2015-09-18 (×3): via INTRAVENOUS

## 2015-09-17 MED ORDER — HEPARIN SODIUM (PORCINE) 5000 UNIT/ML IJ SOLN
5000.0000 [IU] | Freq: Three times a day (TID) | INTRAMUSCULAR | Status: DC
  Administered 2015-09-17 – 2015-09-18 (×2): 5000 [IU] via SUBCUTANEOUS
  Filled 2015-09-17 (×2): qty 1

## 2015-09-17 MED ORDER — INSULIN ASPART 100 UNIT/ML ~~LOC~~ SOLN: 0.0000 [IU] | Freq: Every day | SUBCUTANEOUS | Status: DC

## 2015-09-17 MED ORDER — INSULIN ASPART 100 UNIT/ML ~~LOC~~ SOLN
10.0000 [IU] | Freq: Once | SUBCUTANEOUS | Status: AC
  Administered 2015-09-17: 10 [IU] via SUBCUTANEOUS

## 2015-09-17 MED ORDER — MORPHINE SULFATE (PF) 2 MG/ML IV SOLN: 2.0000 mg | INTRAVENOUS | Status: DC | PRN

## 2015-09-17 MED ORDER — INSULIN GLARGINE 100 UNIT/ML ~~LOC~~ SOLN
5.0000 [IU] | Freq: Every day | SUBCUTANEOUS | Status: DC
  Administered 2015-09-17: 5 [IU] via SUBCUTANEOUS
  Filled 2015-09-17: qty 0.05

## 2015-09-17 MED ORDER — HYDRALAZINE HCL 20 MG/ML IJ SOLN
5.0000 mg | INTRAMUSCULAR | Status: DC | PRN
  Administered 2015-09-17: 5 mg via INTRAVENOUS
  Filled 2015-09-17: qty 1

## 2015-09-17 MED ORDER — VITAMIN B-1 100 MG PO TABS
100.0000 mg | ORAL_TABLET | Freq: Every day | ORAL | Status: DC
  Filled 2015-09-17: qty 1

## 2015-09-17 MED ORDER — LORAZEPAM 2 MG/ML IJ SOLN: 1.0000 mg | Freq: Four times a day (QID) | INTRAMUSCULAR | Status: DC | PRN

## 2015-09-17 MED ORDER — LORAZEPAM 2 MG/ML IJ SOLN
0.0000 mg | Freq: Four times a day (QID) | INTRAMUSCULAR | Status: DC
Start: 2015-09-17 — End: 2015-09-18
  Administered 2015-09-17 (×2): 1 mg via INTRAVENOUS
  Filled 2015-09-17 (×4): qty 1

## 2015-09-17 MED ORDER — IOHEXOL 300 MG/ML  SOLN
25.0000 mL | INTRAMUSCULAR | Status: AC
  Administered 2015-09-17 (×2): 25 mL via ORAL

## 2015-09-17 MED ORDER — ONDANSETRON HCL 4 MG/2ML IJ SOLN: 4.0000 mg | Freq: Three times a day (TID) | INTRAMUSCULAR | Status: DC | PRN

## 2015-09-17 MED ORDER — INSULIN GLARGINE 100 UNIT/ML ~~LOC~~ SOLN
5.0000 [IU] | Freq: Every day | SUBCUTANEOUS | Status: DC
  Administered 2015-09-17: 5 [IU] via SUBCUTANEOUS
  Filled 2015-09-17 (×3): qty 0.05

## 2015-09-17 MED ORDER — ACETAMINOPHEN 325 MG PO TABS: 650.0000 mg | ORAL_TABLET | Freq: Four times a day (QID) | ORAL | Status: DC | PRN

## 2015-09-17 MED ORDER — INSULIN ASPART 100 UNIT/ML ~~LOC~~ SOLN
0.0000 [IU] | Freq: Three times a day (TID) | SUBCUTANEOUS | Status: DC
  Administered 2015-09-17: 1 [IU] via SUBCUTANEOUS
  Administered 2015-09-17: 9 [IU] via SUBCUTANEOUS

## 2015-09-17 MED ORDER — ADULT MULTIVITAMIN W/MINERALS CH
1.0000 | ORAL_TABLET | Freq: Every day | ORAL | Status: DC
  Administered 2015-09-17: 1 via ORAL
  Filled 2015-09-17: qty 1

## 2015-09-17 MED ORDER — ACETAMINOPHEN 650 MG RE SUPP: 650.0000 mg | Freq: Four times a day (QID) | RECTAL | Status: DC | PRN

## 2015-09-17 MED ORDER — INSULIN ASPART 100 UNIT/ML ~~LOC~~ SOLN
6.0000 [IU] | Freq: Once | SUBCUTANEOUS | Status: AC
  Administered 2015-09-17: 6 [IU] via SUBCUTANEOUS

## 2015-09-17 MED ORDER — SODIUM CHLORIDE 0.9 % IV BOLUS (SEPSIS): 2000.0000 mL | Freq: Once | INTRAVENOUS | Status: DC

## 2015-09-17 MED ORDER — THIAMINE HCL 100 MG/ML IJ SOLN: 100.0000 mg | Freq: Every day | INTRAMUSCULAR | Status: DC

## 2015-09-17 MED ORDER — INSULIN ASPART 100 UNIT/ML ~~LOC~~ SOLN
0.0000 [IU] | Freq: Three times a day (TID) | SUBCUTANEOUS | Status: DC
  Administered 2015-09-18: 7 [IU] via SUBCUTANEOUS

## 2015-09-17 MED ORDER — FOLIC ACID 1 MG PO TABS
1.0000 mg | ORAL_TABLET | Freq: Every day | ORAL | Status: DC
  Administered 2015-09-17: 1 mg via ORAL
  Filled 2015-09-17: qty 1

## 2015-09-17 MED ORDER — INSULIN ASPART 100 UNIT/ML ~~LOC~~ SOLN
10.0000 [IU] | Freq: Once | SUBCUTANEOUS | Status: AC
  Administered 2015-09-17: 10 [IU] via SUBCUTANEOUS
  Filled 2015-09-17: qty 1

## 2015-09-17 MED ORDER — PANTOPRAZOLE SODIUM 40 MG PO TBEC
40.0000 mg | DELAYED_RELEASE_TABLET | Freq: Every day | ORAL | Status: DC
  Filled 2015-09-17: qty 1

## 2015-09-17 MED ORDER — SODIUM CHLORIDE 0.9 % IJ SOLN: 3.0000 mL | Freq: Two times a day (BID) | INTRAMUSCULAR | Status: DC

## 2015-09-17 NOTE — ED Notes (Signed)
Pt has two cartons of milk (one chocolate) he has a sandwich and apple sauce. Dr. Preston Fleeting approved the pt receiving this food. This RN did not give these items to the pt.

## 2015-09-17 NOTE — ED Notes (Signed)
pts CBG 386  

## 2015-09-17 NOTE — Progress Notes (Signed)
[facility]                                                                              Patient Demographics  Clayton [name], is a 64 y.o. male, DOB - [birth year], ZOX:[id number]  Admit date - 09/16/2015   Admitting Physician Lorretta Harp, MD  Outpatient Primary MD for the patient is No PCP Per Patient  LOS - 0   Chief Complaint  Patient presents with  . Hyperglycemia      HPI on 09/17/2015 by Dr. Lorretta Harp Clayton [name] is a 64 y.o. male with PMH of hypertension, diabetes mellitus, PTSD, gerd, alcohol abuse, ovaries, CKD-III, medication noncompliance, who presents with abdominal pain, nausea, vomiting,dizziness, elevated blood sugar. Patient was brought in by EMS. Per EMS, pt flagged down police and told them he had "diabetic problems." They state that he complained of dizziness and their glucometer read "HIGH" when they checked the pt's CBG. Patient reports to me that he has not been taking all medications for more than 2 months. He drinks beer every day. Today he developed abdominal pain, nausea and vomiting. His abdominal pain is 6 out of 10 in severity, constant, nonradiating. It is located in periumbilical area. He does not have diarrhea. He denies chest pain, shortness breath, cough, symptoms of UTI, unilateral weakness. In ED, patient was found to very orthostatic in ED, lactate 1.27, negative troponin, WBC 3.9, normal temperature, worsening renal function. Patient's and admitted to inpatient for further management and treatment.  Assessment & Plan  Patient admitted earlier this morning by Dr. Lorretta Harp.  Please see full H&P for details. Agree with current plan.  Abdominal pain, nausea and vomiting -Etiology unclear -CT of the abdomen and pelvis: No acute  -Patient states he is no longer having pain or nausea and like to eat -Continue antiemetics as needed, pain control, IV fluid  -LFTs within normal limits -Pending lipase however patient will not allow further lab  draw -Will place on liquid diet  Hyperglycemia in the setting of diabetes mellitus, type -Glucose upon admission 716 -Last hemoglobin A1c 12.7 on 08/17/2015 -Patient has not been taking his glipizide nor NovoLog at home -Continue insulin sliding scale, Lantus, CBG monitoring -Currently not DKA, anion gap 8  Accelerated hypertension -Patientis taking HCTZ at home, currently held -Continue hydralazine PRN  GERD -Continue PPI  Chronic anemia -Hemoglobin appears to be between 9 and 10, currently 11.2  -Continue to monitor CBC   Chronic kidney disease, stage III -Baseline creatinine approximately 1.5-1.6, currently 1.7 -GFR stable with stage III range -Renal ultrasound showed no evidence of hydronephrosis; diffuse bladder wall thickening may reflect chronic inflammation, question evidence of cystitis -Patient will now allow for UA  Tobacco and alcohol abuse  -Smoking cessation discussed -Alcohol level less than 5 -Currently no evidence of withdrawal, continue CIWA protocol -UDS pending  Code Status: Full   Family Communication: none at bedside  Disposition Plan: Admitted.   Time Spent in minutes   30 minutes  Procedures  Renal US  Consults   None  DVT Prophylaxis  heparin  Alizee Maple D.O. on 09/17/2015 at 10:52 AM  Between 7am to 7pm - Pager - [phone]  After 7pm go to [email or web address] - password [email or web address]  And look for the night coverage person covering for me after hours  [facility] Office  [phone]

## 2015-09-17 NOTE — Progress Notes (Signed)
PT Cancellation/Discharge Note  Patient Details Name: Clayton [name] MRN: [id number] DOB: 1951-02-05   Cancelled Treatment:    Reason Eval/Treat Not Completed: Patient declined, no reason specified.  Patient states he is "moving fine" and "I don't need PT".  Patient agitated, telling PT to leave room.  Patient declining PT services - PT will sign off.   Vena Austria 09/17/2015, 9:32 AM Durenda Hurt. Renaldo Fiddler, [facility] Pager [phone]

## 2015-09-17 NOTE — ED Notes (Signed)
This RN talked to IV team. They are not able to come back at this time. Dr. Clyde Lundborg aware.

## 2015-09-17 NOTE — Progress Notes (Signed)
Patient refused lab draws this am. Lab and nurse attempted to have him agree to this, declined. Pt drink one bottle of contrast after much resistance. I left the 2nd bottle in pt's room but within and minute of leaving the room, he had set the bed alarm off and we went entered he was standing at bedside with 2nd bottle empty may have pored it down sink, patient will not state but I was only out of room 2 minutes. Patient refusing to wear his telemetry monitor at this time. 09/17/2015 9:18 AM Gray Doering

## 2015-09-17 NOTE — ED Notes (Signed)
Dr. Preston Fleeting unable to obtain EJ IV access.

## 2015-09-17 NOTE — ED Notes (Signed)
CBG 339 

## 2015-09-17 NOTE — ED Notes (Signed)
IV start unsuccessful x 2. Pt requesting a break before attempting to start IV.

## 2015-09-17 NOTE — H&P (Signed)
[facility] History and Physical  LAMON ROTUNDO WUJ:[id number] DOB: 1950-12-13 DOA: 09/16/2015  Referring physician: ED physician PCP: No PCP Per Patient  Specialists:   Chief Complaint: abdominal pain, nausea, vomiting, dizziness, elevated blood sugar  HPI: Clayton [name] is a 64 y.o. male with PMH of hypertension, diabetes mellitus, PTSD, gerd, alcohol abuse, ovaries, CKD-III, medication noncompliance, who presents with abdominal pain, nausea, vomiting,dizziness, elevated blood sugar.  Patient was brought in by EMS. Per EMS, pt flagged down police and told them he had "diabetic problems." They state that he complained of dizziness and their glucometer read "HIGH" when they checked the pt's CBG. Patient reports to me that he has not been taking all medications for more than 2 months. He drinks beer every day. Today he developed abdominal pain, nausea and vomiting. His abdominal pain is 6 out of 10 in severity, constant, nonradiating. It is located in periumbilical area. He does not have diarrhea. He denies chest pain, shortness breath, cough, symptoms of UTI, unilateral weakness.  In ED, patient was found to very orthostatic in ED, lactate 1.27, negative troponin, WBC 3.9, normal temperature, worsening renal function. Patient's and admitted to inpatient for further management and treatment.  Where does patient live?   At apartment with friends  Can patient participate in ADLs?  Some   Review of Systems:   General: no fevers, chills, no changes in body weight, has poor appetite, has fatigue HEENT: no blurry vision, hearing changes or sore throat Pulm: no dyspnea, coughing, wheezing CV: no chest pain, palpitations Abd: has nausea, vomiting, abdominal pain, no diarrhea, constipation GU: no dysuria, burning on urination, increased urinary frequency, hematuria  Ext: no leg edema Neuro: no unilateral weakness, numbness, or tingling, no vision change or hearing loss Skin: no  rash MSK: No muscle spasm, no deformity, no limitation of range of movement in spin Heme: No easy bruising.  Travel history: No recent long distant travel.  Allergy: No Known Allergies  Past Medical History  Diagnosis Date  . Hypertension   . Diabetes mellitus   . Homelessness   . Periodontal disease   . Noncompliance with medications   . PTSD (post-traumatic stress disorder)   . Alcohol abuse, unspecified     History reviewed. No pertinent past surgical history.  Social History:  reports that he has never smoked. He does not have any smokeless tobacco history on file. He reports that he drinks alcohol. He reports that he does not use illicit drugs.  Family History:  Family History  Problem Relation Age of Onset  . Diabetes Mellitus II Mother   . Diabetes Mellitus II Father   . Stroke Maternal Grandfather      Prior to Admission medications   Medication Sig Start Date End Date Taking? Authorizing Provider  glipiZIDE (GLUCOTROL) 5 MG tablet Take 1 tablet (5 mg total) by mouth 2 (two) times daily before a meal. Patient not taking: Reported on 07/31/2015 07/19/15   [name], MD  hydrochlorothiazide (HYDRODIURIL) 25 MG tablet Take 1 tablet (25 mg total) by mouth daily. Patient not taking: Reported on 08/16/2015 07/31/15   [name], MD  insulin aspart (NOVOLOG) 100 UNIT/ML injection Inject 6 Units into the skin 3 (three) times daily before meals. Patient not taking: Reported on 07/31/2015 07/19/15   [name], MD    Physical Exam: Filed Vitals:   09/17/15 0230 09/17/15 0315 09/17/15 0331 09/17/15 0415  BP: 191/106 183/97 96/58 183/94  Pulse:   94 84  Temp:  TempSrc:      Resp: Height:      Weight:      SpO2:   95% 100%   General: Not in acute distress. Dry mucous and membrane HEENT:       Eyes: PERRL, EOMI, no scleral icterus.       ENT: No discharge from the ears and nose, no pharynx injection, no tonsillar enlargement.        Neck: No JVD, no  bruit, no mass felt. Heme: No neck lymph node enlargement. Cardiac: S1/S2, RRR, No murmurs, No gallops or rubs. Pulm: No rales, wheezing, rhonchi or rubs. Abd: Soft, nondistended, tenderness over periumbilical area, no rebound pain, no organomegaly, BS present. Ext: No pitting leg edema bilaterally. 2+DP/PT pulse bilaterally. Musculoskeletal: No joint deformities, No joint redness or warmth, no limitation of ROM in spin. Skin: No rashes.  Neuro: Alert, oriented X3, cranial nerves II-XII grossly intact, muscle strength 5/5 in all extremities, sensation to light touch intact. Psych: Patient is not psychotic, no suicidal or hemocidal ideation.  Labs on Admission:  Basic Metabolic Panel:  Recent Labs Lab 09/17/15 0034  NA 124*  K 3.8  CL 90*  CO2 26  GLUCOSE 716*  BUN 23*  CREATININE 1.74*  CALCIUM 8.5*   Liver Function Tests:  Recent Labs Lab 09/17/15 0034  AST 27  ALT 33  ALKPHOS 147*  BILITOT 0.2*  PROT 5.7*  ALBUMIN 2.6*   No results for input(s): LIPASE, AMYLASE in the last 168 hours. No results for input(s): AMMONIA in the last 168 hours. CBC:  Recent Labs Lab 09/17/15 0034  WBC 3.9*  NEUTROABS 2.3  HGB 11.2*  HCT 32.3*  MCV 87.5  PLT 206   Cardiac Enzymes:  Recent Labs Lab 09/17/15 0034  TROPONINI 0.03    BNP (last 3 results)  Recent Labs  08/17/15 0330  BNP 303.5*    ProBNP (last 3 results) No results for input(s): PROBNP in the last 8760 hours.  CBG:  Recent Labs Lab 09/16/15 2353 09/17/15 0222 09/17/15 0418  GLUCAP 596* 483* 386*    Radiological Exams on Admission: No results found.  EKG: Independently reviewed.  Abnormal findings: QTC 460, old right bundle blockage  Assessment/Plan Principal Problem:   Abdominal pain Active Problems:   Gastroparesis due to DM (HCC)   GERD (gastroesophageal reflux disease)   Nausea & vomiting   Neuropathy of upper extremity   Hyperglycemia   Diabetes mellitus type 2, uncontrolled  (HCC)   Chronic anemia   Acute renal failure superimposed on stage 3 chronic kidney disease (HCC)   Essential hypertension  Abdominal pain, nausea, vomiting: etiology is not clear. Differential diagnoses include alcoholic gastritis, pancreatitis, gastroparesis due to diabetes, viral gastritis.Patient is clinically dehydrated. - will admit to tele - NPO - prn zofran for nausea and morphine for pain - Protonix  - IVF: 2L NS, then 125 cc/h - check lipase - CT-abd/pelvis  DM-II: Last A1c 12.7 on 08/17/15. poorly controled. Patient is not taking his glipizide and NovoLog at home. Patient's blood sugar was 716 by BMP, which improved to 386 after 10 units of novology -start Lantus 5 units daily  -SSI  GERD: -Protonix  Chronic anemia: hgb stable. Hgb =11.2 -f/u by cbc  AoCKD-III: Baseline Cre is 1.3-1.5, his Cre is 1.74, BUN 23 on admission. Likely due to prerenal secondary to dehydration due to nausea and vomiting - IVF as above - Check FeNa - US-renal - Follow up renal  function by BMP  Essential hypertension: supposed to take HCTZ, but has not been complaint. patient is dehydrated, we'll not start HCTZ now -IV hydralazinewhen necessary  Tobacco abuse and Alcohol abuse: -Did counseling about the importance of quitting drinking -CIWA protocol -check UDS and HIV ab   DVT ppx: SQ Heparin    Code Status: Full code Family Communication: None at bed side.   Disposition Plan: Admit to inpatient   Date of Service 09/17/2015    Lorretta Harp [facility] Pager [phone]  If 7PM-7AM, please contact night-coverage [email or web address] Password [email or web address] 09/17/2015, 4:56 AM

## 2015-09-17 NOTE — Progress Notes (Signed)
Patient refusing vitals and meds, just requesting food, I informed him meal tray was on its way. 09/17/2015 12:25 PM Khiree Bukhari

## 2015-09-17 NOTE — ED Notes (Signed)
Patient transported to Ultrasound 

## 2015-09-17 NOTE — ED Notes (Signed)
Pt refusing in and out cath.  

## 2015-09-17 NOTE — ED Notes (Signed)
IV team was in room with pt, attempted to stick pt once and the pt stated that he would not allow her to try again.

## 2015-09-17 NOTE — ED Notes (Signed)
Gave Pt sandwich and milk, per Dr Preston Fleeting

## 2015-09-17 NOTE — ED Notes (Signed)
This RN talked to [employer] from IV team [phone]).

## 2015-09-17 NOTE — ED Notes (Signed)
Dr. Clyde Lundborg at bedside - he is aware of pts refusal to have IV

## 2015-09-17 NOTE — ED Notes (Signed)
Attempting to call report. Placed on hold.

## 2015-09-18 DIAGNOSIS — R109 Unspecified abdominal pain: Secondary | ICD-10-CM

## 2015-09-18 DIAGNOSIS — E118 Type 2 diabetes mellitus with unspecified complications: Secondary | ICD-10-CM

## 2015-09-18 DIAGNOSIS — E1165 Type 2 diabetes mellitus with hyperglycemia: Principal | ICD-10-CM

## 2015-09-18 LAB — GLUCOSE, CAPILLARY: GLUCOSE-CAPILLARY: 336 mg/dL — AB (ref 65–99)

## 2015-09-18 LAB — HIV ANTIBODY (ROUTINE TESTING W REFLEX): HIV SCREEN 4TH GENERATION: NONREACTIVE

## 2015-09-18 NOTE — Progress Notes (Addendum)
0800 Pt has decided to leave AMA. Pt wishes to leave after breakfast tray is eaten. Some of pt food from breakfast tray fell on floor. Pt was advised not to eat food from floor but ate food regardless of instructions he ate food. Dr. Catha Gosselin has been notified of pt wishes to leave AMA. 0840 Risks to leaving AMA explained to pt in full detail including possibility for sudden death. Pt is AOx4 and verbalized understanding but still wishes to sign AMA. AMA papers signed. IV removed. Telemetry removed. Dr. Catha Gosselin notified pt has left AMA at this time.  Jilda Panda RN

## 2015-09-18 NOTE — Discharge Summary (Signed)
Physician Discharge Summary  Clayton [name]:[id number] DOB: 09-Feb-1951 DOA: 09/16/2015  PCP: No PCP Per Patient  Admit date: 09/16/2015 Discharge date: 09/18/2015  Time spent: 20 minutes  Recommendations for Outpatient Follow-up:  None, patient left AGAINST MEDICAL ADVICE  Discharge Diagnoses:  Principal Problem:   Abdominal pain Active Problems:   Gastroparesis due to DM (HCC)   GERD (gastroesophageal reflux disease)   Nausea & vomiting   Neuropathy of upper extremity   Hyperglycemia   Diabetes mellitus type 2, uncontrolled (HCC)   Chronic anemia   Acute renal failure superimposed on stage 3 chronic kidney disease (HCC)   Essential hypertension   Discharge Condition: not stable  Diet recommendation: none  Filed Weights   09/16/15 2342 09/17/15 1006 09/18/15 0514  Weight: 83.008 kg (183 lb) 76.5 kg (168 lb 10.4 oz) 80.5 kg (177 lb 7.5 oz)    History of present illness:  on 09/17/2015 by Dr. Gladstone Pih is a 64 y.o. male with PMH of hypertension, diabetes mellitus, PTSD, gerd, alcohol abuse, ovaries, CKD-III, medication noncompliance, who presents with abdominal pain, nausea, vomiting,dizziness, elevated blood sugar. Patient was brought in by EMS. Per EMS, pt flagged down police and told them he had "diabetic problems." They state that he complained of dizziness and their glucometer read "HIGH" when they checked the pt's CBG. Patient reports to me that he has not been taking all medications for more than 2 months. He drinks beer every day. Today he developed abdominal pain, nausea and vomiting. His abdominal pain is 6 out of 10 in severity, constant, nonradiating. It is located in periumbilical area. He does not have diarrhea. He denies chest pain, shortness breath, cough, symptoms of UTI, unilateral weakness. In ED, patient was found to very orthostatic in ED, lactate 1.27, negative troponin, WBC 3.9, normal temperature, worsening renal function. Patient's  and admitted to inpatient for further management and treatment.  Hospital Course:  Abdominal pain, nausea and vomiting -Etiology unclear -CT of the abdomen and pelvis: No acute  -Patient states he is no longer having pain or nausea and like to eat -Continue antiemetics as needed, pain control, IV fluid  -LFTs within normal limits -Lipase WNL -Patient was able to tolerate carb modified diet  Hyperglycemia in the setting of diabetes mellitus, type -Glucose upon admission 716 -Last hemoglobin A1c 12.7 on 08/17/2015 -Patient has not been taking his glipizide nor NovoLog at home -Continue insulin sliding scale, Lantus, CBG monitoring -Currently not DKA, anion gap 8 -Patient would not allow for lab draws  Accelerated hypertension -Patientis taking HCTZ at home, currently held -Continue hydralazine PRN  GERD -Continue PPI  Chronic anemia -Hemoglobin appears to be between 9 and 10, currently 11.2  -Continue to monitor CBC   Chronic kidney disease, stage III -Baseline creatinine approximately 1.5-1.6, currently 1.74 -GFR stable with stage III range -Renal ultrasound showed no evidence of hydronephrosis; diffuse bladder wall thickening may reflect chronic inflammation, question evidence of cystitis -UA negative for infection  Tobacco and alcohol abuse  -Smoking cessation discussed -Alcohol level less than 5 -Currently no evidence of withdrawal, continue CIWA protocol -UDS negative  Hyponatremia -Secondary to hyperglycemia -Patient would not allow for lab draws  Procedures:  None  Consultations:  None  Discharge Exam: Filed Vitals:   09/18/15 0514  BP: 127/71  Pulse: 86  Temp: 98.9 F (37.2 C)  Resp: 18   None, patient left AMA  Discharge Instructions     Medication List    ASK  your doctor about these medications        glipiZIDE 5 MG tablet  Commonly known as:  GLUCOTROL  Take 1 tablet (5 mg total) by mouth 2 (two) times daily before a meal.      hydrochlorothiazide 25 MG tablet  Commonly known as:  HYDRODIURIL  Take 1 tablet (25 mg total) by mouth daily.     insulin aspart 100 UNIT/ML injection  Commonly known as:  novoLOG  Inject 6 Units into the skin 3 (three) times daily before meals.       No Known Allergies    The results of significant diagnostics from this hospitalization (including imaging, microbiology, ancillary and laboratory) are listed below for reference.    Significant Diagnostic Studies: Ct Abdomen Pelvis Wo Contrast  09/17/2015   CLINICAL DATA:  Lower abdominal pain, nausea and vomiting. Symptoms are subacute. Generalized abdominal pain. Initial encounter.  EXAM: CT ABDOMEN AND PELVIS WITHOUT CONTRAST  TECHNIQUE: Multidetector CT imaging of the abdomen and pelvis was performed following the standard protocol without IV contrast.  COMPARISON:  Ultrasound same day.  CT report 01/13/2012  FINDINGS: Lung bases are clear. No pleural or pericardial fluid. No liver lesions seen without contrast. Insignificant calcified granuloma in the right lobe. No calcified gallstones. The spleen is normal. The pancreas is normal. The adrenal glands are normal. The kidneys are normal. No cyst, mass, stone or hydronephrosis. I do not see a worrisome perinephric edema pattern by CT. The aorta shows mild atherosclerosis but there is no aneurysm. The IVC is normal. No retroperitoneal mass or adenopathy. No free fluid. No free air. Bladder, prostate gland and seminal vesicles are unremarkable. No visible bowel pathology. There is chronic degenerative disc disease at L5-S1. There chronic pars defects at L5 but no slippage.  IMPRESSION: No acute abdominal or pelvic finding.  No cause of pain identified.  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  Atherosclerosis of the aorta and its branch vessels.   Electronically Signed   By: Paulina Fusi M.D.   On: 09/17/2015 10:09   US Renal  09/17/2015   CLINICAL  DATA:  Acute onset of generalized abdominal pain. Initial encounter.  EXAM: RENAL / URINARY TRACT ULTRASOUND COMPLETE  COMPARISON:  None.  FINDINGS: Right Kidney:  Length: 10.5 cm. Mildly increased parenchymal echogenicity noted. Trace fluid is noted about the right kidney. No mass or hydronephrosis visualized.  Left Kidney:  Length: 11.6 cm. Mildly increased parenchymal echogenicity noted. Trace fluid is noted about the left kidney. No mass or hydronephrosis visualized.  Bladder:  Diffuse bladder wall thickening is noted, measuring 7 mm in thickness. A left ureteral jet is visualized.  The prostate is mildly enlarged.  IMPRESSION: 1. No evidence of hydronephrosis. 2. Mild nonspecific perinephric fluid noted bilaterally. 3. Mildly increased renal parenchymal echogenicity raises concern for medical renal disease. 4. Diffuse bladder wall thickening may reflect chronic inflammation. Would correlate for any evidence of cystitis. Left ureteral jet seen. 5. Mildly enlarged prostate.   Electronically Signed   By: Roanna Raider M.D.   On: 09/17/2015 06:32    Microbiology: Recent Results (from the past 240 hour(s))  MRSA PCR Screening     Status: None   Collection Time: 09/17/15  9:11 AM  Result Value Ref Range Status   MRSA by PCR NEGATIVE NEGATIVE Final    Comment:        The GeneXpert MRSA Assay (FDA approved for NASAL specimens only), is one component  of a comprehensive MRSA colonization surveillance program. It is not intended to diagnose MRSA infection nor to guide or monitor treatment for MRSA infections.      Labs: Basic Metabolic Panel:  Recent Labs Lab 09/17/15 0034  NA 124*  K 3.8  CL 90*  CO2 26  GLUCOSE 716*  BUN 23*  CREATININE 1.74*  CALCIUM 8.5*   Liver Function Tests:  Recent Labs Lab 09/17/15 0034  AST 27  ALT 33  ALKPHOS 147*  BILITOT 0.2*  PROT 5.7*  ALBUMIN 2.6*    Recent Labs Lab 09/17/15 0028  LIPASE 24   No results for input(s): AMMONIA in the  last 168 hours. CBC:  Recent Labs Lab 09/17/15 0034  WBC 3.9*  NEUTROABS 2.3  HGB 11.2*  HCT 32.3*  MCV 87.5  PLT 206   Cardiac Enzymes:  Recent Labs Lab 09/17/15 0034  TROPONINI 0.03   BNP: BNP (last 3 results)  Recent Labs  08/17/15 0330  BNP 303.5*    ProBNP (last 3 results) No results for input(s): PROBNP in the last 8760 hours.  CBG:  Recent Labs Lab 09/17/15 0701 09/17/15 1220 09/17/15 1550 09/17/15 2219 09/18/15 0609  GLUCAP 130* 95 418* 458* 336*       Signed:  Othello Sgroi  [facility] 09/18/2015, 12:45 PM

## 2015-09-21 ENCOUNTER — Encounter (HOSPITAL_COMMUNITY): Payer: Self-pay | Admitting: Emergency Medicine

## 2015-09-21 ENCOUNTER — Emergency Department (HOSPITAL_COMMUNITY)
Admission: EM | Admit: 2015-09-21 | Discharge: 2015-09-22 | Disposition: A | Discharge status: Home / Self Care | Payer: Medicaid Other | Attending: Emergency Medicine | Admitting: Emergency Medicine

## 2015-09-21 DIAGNOSIS — I10 Essential (primary) hypertension: Secondary | ICD-10-CM | POA: Insufficient documentation

## 2015-09-21 DIAGNOSIS — E1165 Type 2 diabetes mellitus with hyperglycemia: Secondary | ICD-10-CM | POA: Insufficient documentation

## 2015-09-21 DIAGNOSIS — Z8719 Personal history of other diseases of the digestive system: Secondary | ICD-10-CM | POA: Insufficient documentation

## 2015-09-21 DIAGNOSIS — E119 Type 2 diabetes mellitus without complications: Secondary | ICD-10-CM

## 2015-09-21 DIAGNOSIS — Z794 Long term (current) use of insulin: Secondary | ICD-10-CM | POA: Insufficient documentation

## 2015-09-21 DIAGNOSIS — Z8659 Personal history of other mental and behavioral disorders: Secondary | ICD-10-CM | POA: Diagnosis not present

## 2015-09-21 DIAGNOSIS — Z590 Homelessness: Secondary | ICD-10-CM | POA: Diagnosis not present

## 2015-09-21 LAB — CBC
HCT: 30.3 % — ABNORMAL LOW (ref 39.0–52.0)
HEMOGLOBIN: 10.2 g/dL — AB (ref 13.0–17.0)
MCH: 29.4 pg (ref 26.0–34.0)
MCHC: 33.7 g/dL (ref 30.0–36.0)
MCV: 87.3 fL (ref 78.0–100.0)
Platelets: 203 10*3/uL (ref 150–400)
RBC: 3.47 MIL/uL — ABNORMAL LOW (ref 4.22–5.81)
RDW: 13.5 % (ref 11.5–15.5)
WBC: 3.4 10*3/uL — ABNORMAL LOW (ref 4.0–10.5)

## 2015-09-21 LAB — BASIC METABOLIC PANEL
ANION GAP: 9 (ref 5–15)
BUN: 19 mg/dL (ref 6–20)
CALCIUM: 8.3 mg/dL — AB (ref 8.9–10.3)
CHLORIDE: 92 mmol/L — AB (ref 101–111)
CO2: 23 mmol/L (ref 22–32)
CREATININE: 1.56 mg/dL — AB (ref 0.61–1.24)
GFR calc Af Amer: 53 mL/min — ABNORMAL LOW (ref >60)
GFR calc non Af Amer: 46 mL/min — ABNORMAL LOW (ref >60)
GLUCOSE: 548 mg/dL — AB (ref 65–99)
Potassium: 5 mmol/L (ref 3.5–5.1)
Sodium: 124 mmol/L — ABNORMAL LOW (ref 135–145)

## 2015-09-21 LAB — CBG MONITORING, ED: GLUCOSE-CAPILLARY: 508 mg/dL — AB (ref 65–99)

## 2015-09-21 MED ORDER — INSULIN ASPART 100 UNIT/ML ~~LOC~~ SOLN
12.0000 [IU] | Freq: Once | SUBCUTANEOUS | Status: AC
  Administered 2015-09-21: 12 [IU] via SUBCUTANEOUS
  Filled 2015-09-21: qty 1

## 2015-09-21 MED ORDER — SODIUM CHLORIDE 0.9 % IV BOLUS (SEPSIS)
1000.0000 mL | Freq: Once | INTRAVENOUS | Status: AC
  Administered 2015-09-21: 1000 mL via INTRAVENOUS

## 2015-09-21 NOTE — ED Notes (Signed)
Pt does not have insulin.

## 2015-09-21 NOTE — ED Notes (Signed)
Per ems-- pt c/o fatigue, tingling in bilateral hands. cbg 492. bp 124/78. Pt also c/o abdominal pain after eating fried chicken.

## 2015-09-21 NOTE — ED Provider Notes (Signed)
CSN: [id number]     Arrival date & time 09/21/15  2149 History   First MD Initiated Contact with Patient 09/21/15 2238     Chief Complaint  Patient presents with  . Hyperglycemia      HPI  Patient presents for evaluation of an elevated blood sugar. He is homeless. Stopped a Emergency planning/management officerpolice officer with a complaint of low diabetic problems". Has not had insulin since his last time he presented here with complaint of high sugars and no insulin. Really has no specific complaints other that he would like a sandwich.  Past Medical History  Diagnosis Date  . Hypertension   . Diabetes mellitus   . Homelessness   . Periodontal disease   . Noncompliance with medications   . PTSD (post-traumatic stress disorder)   . Alcohol abuse, unspecified    History reviewed. No pertinent past surgical history. Family History  Problem Relation Age of Onset  . Diabetes Mellitus II Mother   . Diabetes Mellitus II Father   . Stroke Maternal Grandfather    Social History  Substance Use Topics  . Smoking status: Never Smoker   . Smokeless tobacco: None  . Alcohol Use: Yes    Review of Systems  Constitutional: Negative for fever, chills, diaphoresis, appetite change and fatigue.  HENT: Negative for mouth sores, sore throat and trouble swallowing.   Eyes: Negative for visual disturbance.  Respiratory: Negative for cough, chest tightness, shortness of breath and wheezing.   Cardiovascular: Negative for chest pain.  Gastrointestinal: Negative for nausea, vomiting, abdominal pain, diarrhea and abdominal distention.  Endocrine: Negative for polydipsia, polyphagia and polyuria.  Genitourinary: Negative for dysuria, frequency and hematuria.  Musculoskeletal: Negative for gait problem.  Skin: Negative for color change, pallor and rash.  Neurological: Negative for dizziness, syncope, light-headedness and headaches.  Hematological: Does not bruise/bleed easily.  Psychiatric/Behavioral: Negative for behavioral  problems and confusion.      Allergies  Review of patient's allergies indicates no known allergies.  Home Medications   Prior to Admission medications   Medication Sig Start Date End Date Taking? Authorizing Provider  glipiZIDE (GLUCOTROL) 5 MG tablet Take 1 tablet (5 mg total) by mouth 2 (two) times daily before a meal. Patient not taking: Reported on 07/31/2015 07/19/15   Paula LibraJohn Molpus, MD  hydrochlorothiazide (HYDRODIURIL) 25 MG tablet Take 1 tablet (25 mg total) by mouth daily. Patient not taking: Reported on 08/16/2015 07/31/15   Blane OharaJoshua Zavitz, MD  insulin aspart (NOVOLOG) 100 UNIT/ML injection Inject 6 Units into the skin 3 (three) times daily before meals. Patient not taking: Reported on 07/31/2015 07/19/15   [name], MD   BP 150/89 mmHg  Pulse 87  Temp(Src) 98.7 F (37.1 C) (Oral)  Resp 16  Ht 6\' 1"  (1.854 m)  Wt 183 lb (83.008 kg)  BMI 24.15 kg/m2  SpO2 98% Physical Exam  Constitutional: He is oriented to person, place, and time. He appears well-developed and well-nourished. No distress.  HENT:  Head: Normocephalic.  Eyes: Conjunctivae are normal. Pupils are equal, round, and reactive to light. No scleral icterus.  Neck: Normal range of motion. Neck supple. No thyromegaly present.  Cardiovascular: Normal rate and regular rhythm.  Exam reveals no gallop and no friction rub.   No murmur heard. Pulmonary/Chest: Effort normal and breath sounds normal. No respiratory distress. He has no wheezes. He has no rales.  Abdominal: Soft. Bowel sounds are normal. He exhibits no distension. There is no tenderness. There is no rebound.  Musculoskeletal: Normal  range of motion.  Neurological: He is alert and oriented to person, place, and time.  Skin: Skin is warm and dry. No rash noted.  Psychiatric: He has a normal mood and affect. His behavior is normal.    ED Course  Procedures (including critical care time) Labs Review Labs Reviewed  BASIC METABOLIC PANEL - Abnormal; Notable  for the following:    Sodium 124 (*)    Chloride 92 (*)    Glucose, Bld 548 (*)    Creatinine, Ser 1.56 (*)    Calcium 8.3 (*)    GFR calc non Af Amer 46 (*)    GFR calc Af Amer 53 (*)    All other components within normal limits  CBC - Abnormal; Notable for the following:    WBC 3.4 (*)    RBC 3.47 (*)    Hemoglobin 10.2 (*)    HCT 30.3 (*)    All other components within normal limits  CBG MONITORING, ED - Abnormal; Notable for the following:    Glucose-Capillary 508 (*)    All other components within normal limits  URINALYSIS, ROUTINE W REFLEX MICROSCOPIC (NOT AT [facility])    Imaging Review No results found. I have personally reviewed and evaluated these images and lab results as part of my medical decision-making.   EKG Interpretation None      MDM   Final diagnoses:  None   States he "forgot" that he was supposed to "go somewhere to get my insulin". Essentially this is gross noncompliance. We'll fix his sugar tonight and again place it in his hands with the ability to go for medications and a chance to be compliant.   Blood sugar is improving. Patient has active Medicaid. Per case manager he can take his prescriptions to [facility] for free medication refills this was discussed with him.    Rolland Porter, MD 09/22/15 [phone]

## 2015-09-22 ENCOUNTER — Encounter (HOSPITAL_COMMUNITY): Payer: Self-pay | Admitting: Emergency Medicine

## 2015-09-22 ENCOUNTER — Emergency Department (HOSPITAL_COMMUNITY)
Admission: EM | Admit: 2015-09-22 | Discharge: 2015-09-22 | Disposition: A | Discharge status: Home / Self Care | Payer: Medicaid Other | Source: Home / Self Care | Attending: Emergency Medicine | Admitting: Emergency Medicine

## 2015-09-22 DIAGNOSIS — Z9114 Patient's other noncompliance with medication regimen: Secondary | ICD-10-CM

## 2015-09-22 DIAGNOSIS — Z5900 Homelessness unspecified: Secondary | ICD-10-CM

## 2015-09-22 DIAGNOSIS — R739 Hyperglycemia, unspecified: Secondary | ICD-10-CM

## 2015-09-22 LAB — CBG MONITORING, ED: GLUCOSE-CAPILLARY: 382 mg/dL — AB (ref 65–99)

## 2015-09-22 LAB — I-STAT CHEM 8, ED
BUN: 21 mg/dL — AB (ref 6–20)
CALCIUM ION: 1.15 mmol/L (ref 1.13–1.30)
Chloride: 97 mmol/L — ABNORMAL LOW (ref 101–111)
Creatinine, Ser: 1.6 mg/dL — ABNORMAL HIGH (ref 0.61–1.24)
Glucose, Bld: 340 mg/dL — ABNORMAL HIGH (ref 65–99)
HCT: 32 % — ABNORMAL LOW (ref 39.0–52.0)
Hemoglobin: 10.9 g/dL — ABNORMAL LOW (ref 13.0–17.0)
Potassium: 3.9 mmol/L (ref 3.5–5.1)
SODIUM: 133 mmol/L — AB (ref 135–145)
TCO2: 23 mmol/L (ref 0–100)

## 2015-09-22 MED ORDER — GLIPIZIDE 5 MG PO TABS: 10.0000 mg | ORAL_TABLET | Freq: Every day | ORAL | Status: DC

## 2015-09-22 MED ORDER — INSULIN ASPART 100 UNIT/ML ~~LOC~~ SOLN: SUBCUTANEOUS | Status: DC

## 2015-09-22 NOTE — ED Notes (Signed)
CBG- 382 

## 2015-09-22 NOTE — ED Provider Notes (Signed)
CSN: [id number]     Arrival date & time 09/22/15  [id number] History   First MD Initiated Contact with Patient 09/22/15 0602     Chief Complaint  Patient presents with  . Abdominal Pain  . Peripheral Neuropathy     (Consider location/radiation/quality/duration/timing/severity/associated sxs/prior Treatment) HPI  Patient with hx HTN, DM, homelessness, medication noncompliance presents to ED 4 hours after his last visit.  When I asked what I could help him with he replied "nothing."  When pressed that he was in the Emergency Department and must be here for a reason he stated he was here for "neuropathy" in his hands.  Notes the neuropathy causes pain in his bilateral hands throughout and is unchanged from his chronic pain.  Denies taking any medications for this.  Admits he does not currently have a place to stay.  Denies any other concerns or needs at this time.   Past Medical History  Diagnosis Date  . Hypertension   . Diabetes mellitus   . Homelessness   . Periodontal disease   . Noncompliance with medications   . PTSD (post-traumatic stress disorder)   . Alcohol abuse, unspecified    History reviewed. No pertinent past surgical history. Family History  Problem Relation Age of Onset  . Diabetes Mellitus II Mother   . Diabetes Mellitus II Father   . Stroke Maternal Grandfather    Social History  Substance Use Topics  . Smoking status: Current Every Day Smoker -- 0.50 packs/day    Types: Cigarettes  . Smokeless tobacco: None  . Alcohol Use: Yes    Review of Systems  All other systems reviewed and are negative.     Allergies  Review of patient's allergies indicates no known allergies.  Home Medications   Prior to Admission medications   Medication Sig Start Date End Date Taking? Authorizing Provider  glipiZIDE (GLUCOTROL) 5 MG tablet Take 2 tablets (10 mg total) by mouth daily before breakfast. 09/22/15   Rolland PorterMark James, MD  hydrochlorothiazide (HYDRODIURIL) 25 MG tablet  Take 1 tablet (25 mg total) by mouth daily. Patient not taking: Reported on 08/16/2015 07/31/15   Blane OharaJoshua Zavitz, MD  insulin aspart (NOVOLOG) 100 UNIT/ML injection Inject 6 units under the skin before meals 3 times per day 09/22/15   Rolland PorterMark James, MD   BP 182/98 mmHg  Pulse 84  Temp(Src) 98.1 F (36.7 C) (Oral)  Resp 18  SpO2 100% Physical Exam  Constitutional: He appears well-developed and well-nourished. No distress.  HENT:  Head: Normocephalic and atraumatic.  Neck: Neck supple.  Cardiovascular: Normal rate and regular rhythm.   Pulmonary/Chest: Effort normal and breath sounds normal. No respiratory distress. He has no wheezes. He has no rales.  Abdominal: Soft. He exhibits no distension and no mass. There is no tenderness. There is no rebound and no guarding.  Neurological: He is alert. He exhibits normal muscle tone.  Skin: He is not diaphoretic.  Lower extremities: right lower leg with multiple scabbed lesions.  No erythema, edema, warmth, discharge, or tenderness.  Bilateral feet without lesions, erythema, edema, warmth, tenderness.    Bilateral hands without lesions, No erythema, edema, or focal tenderness.  He does have bandages in place from recent finger sticks, blood draws.   Nursing note and vitals reviewed.   ED Course  Procedures (including critical care time) Labs Review Labs Reviewed  I-STAT CHEM 8, ED - Abnormal; Notable for the following:    Sodium 133 (*)    Chloride 97 (*)  BUN 21 (*)    Creatinine, Ser 1.60 (*)    Glucose, Bld 340 (*)    Hemoglobin 10.9 (*)    HCT 32.0 (*)    All other components within normal limits    Imaging Review No results found. I have personally reviewed and evaluated these images and lab results as part of my medical decision-making.   EKG Interpretation None       Discussed pt and plan with Dr Fayrene Fearing.    8:22 AM I spoke with case manager Durward Mallard, who will see patient and provide resources.   9:41 AM Pt seen by Buddy Duty, community liaison, who has provided multiple resources and has explained the process and provided pt numbers for switching his Medicaid from [facility] to [facility].    MDM   Final diagnoses:  Hyperglycemia  Homelessness  H/O medication noncompliance    Afebrile nontoxic pt with uncontrolled DM, HTN due to medication noncompliance with frequent ED visits for same presents 4 hours after last ED visit, mostly wanting to sleep and be left undisturbed, clearly irritated to have to answer questions and be examined.  He is homeless and admitted he did not have anywhere else to sleep.  Chem 8 shows hyperglycemia and chronic renal dysfunction but pt is not in DKA.  Case manager and community liaison have both provided information and resources for patient.  He was given prescriptions for his medications last night and can have those filled for $3.      Trixie Dredge, PA-C 09/22/15 1308  Rolland Porter, MD 10/03/15 [phone]

## 2015-09-22 NOTE — ED Notes (Addendum)
Pt given two Malaw[place]urkey sandwiches and a diet ginger ale per request and rn Approval.

## 2015-09-22 NOTE — Discharge Instructions (Signed)
Read the information below.  You may return to the Emergency Department at any time for worsening condition or any new symptoms that concern you.  Please fill the prescriptions provided for your last night and use the resources given to you by the community liaison.

## 2015-09-22 NOTE — ED Notes (Signed)
Pt received breakfast tray 

## 2015-09-22 NOTE — ED Notes (Signed)
Arm repositioned to ensure IV flow. Patient verbalized understanding.

## 2015-09-22 NOTE — Progress Notes (Signed)
Consulted by C.Wood NCM regarding pt.Spoke to patient regarding his medicaid benefits and a primary care provider. Patient was [place] the [place] to [place] to obtain a new card and to change the provider listed on his card. Primary care resource guide also provided for pt to use until medicaid process is complete. My contact information given for any future questions or concerns. No other Community Health & Eligibility Specialist needs identified at this time.  Buddy DutyFelicia Evans [facility]  [phone]

## 2015-09-22 NOTE — Discharge Instructions (Signed)
You have active Medicaid insurance currently.  You can take your prescriptions to [facility] to fill your insulin and Glucotrol prescriptions.

## 2015-09-22 NOTE — ED Notes (Signed)
Pt presents to ED by foot with c/o mid abd pain with nausea - denies v/d; also peripheral neuropathy in fingers

## 2015-09-22 NOTE — Care Management Note (Signed)
Case Management Note  Patient Details  Name: Clayton [name] MRN: [id number] Date of Birth: December 24, 1950  Subjective/Objective:                  64 yo Patient with hx HTN, DM, medication noncompliance presents to ED 4 hours after his last visit.//Lives with room mate in apartment  [address]  [place] [id number]      Action/Plan: Follow for disposition needs.   Expected Discharge Date:          09/22/15        Expected Discharge Plan:  Home/Self Care  In-House Referral:  NA  Discharge planning Services  CM Consult  Post Acute Care Choice:  NA Choice offered to:  NA  DME Arranged:  N/A DME Agency:  NA  HH Arranged:  NA HH Agency:  NA  Status of Service:  Completed, signed off  Medicare Important Message Given:    Date Medicare IM Given:    Medicare IM give by:    Date Additional Medicare IM Given:    Additional Medicare Important Message give by:     If discussed at Long Length of Stay Meetings, dates discussed:    Additional Comments: Spoke with pt at bedside regarding medication assistance.  NCM asked pt if he could locate Medicaid card; pt states he can not put his hands on it.  NCM asked if pt has transportation to appointments and such; pt states that "[name]" will run him on any errands needed.  NCM informed pt that he needed to contact DSS worker to have PCP changed on card and to request replacement card.  NCM consulted Buddy DutyFelicia Evans, [facility] to assist pt with resources.    Oletta CohnWood, Brenyn Petrey, RN 09/22/2015, 9:21 AM

## 2015-09-23 ENCOUNTER — Emergency Department (HOSPITAL_COMMUNITY)
Admission: EM | Admit: 2015-09-23 | Discharge: 2015-09-24 | Disposition: A | Discharge status: Home / Self Care | Payer: Medicaid Other | Source: Home / Self Care | Attending: Emergency Medicine | Admitting: Emergency Medicine

## 2015-09-23 ENCOUNTER — Encounter (HOSPITAL_COMMUNITY): Payer: Self-pay | Admitting: Emergency Medicine

## 2015-09-23 DIAGNOSIS — I10 Essential (primary) hypertension: Secondary | ICD-10-CM

## 2015-09-23 DIAGNOSIS — Z9114 Patient's other noncompliance with medication regimen: Secondary | ICD-10-CM

## 2015-09-23 DIAGNOSIS — R739 Hyperglycemia, unspecified: Secondary | ICD-10-CM

## 2015-09-23 LAB — COMPREHENSIVE METABOLIC PANEL
ALK PHOS: 158 U/L — AB (ref 38–126)
ALT: 35 U/L (ref 17–63)
ANION GAP: 6 (ref 5–15)
AST: 32 U/L (ref 15–41)
Albumin: 2.8 g/dL — ABNORMAL LOW (ref 3.5–5.0)
BUN: 24 mg/dL — ABNORMAL HIGH (ref 6–20)
CALCIUM: 8.4 mg/dL — AB (ref 8.9–10.3)
CO2: 26 mmol/L (ref 22–32)
CREATININE: 1.53 mg/dL — AB (ref 0.61–1.24)
Chloride: 97 mmol/L — ABNORMAL LOW (ref 101–111)
GFR, EST AFRICAN AMERICAN: 55 mL/min — AB (ref >60)
GFR, EST NON AFRICAN AMERICAN: 47 mL/min — AB (ref >60)
Glucose, Bld: 629 mg/dL (ref 65–99)
Potassium: 4.5 mmol/L (ref 3.5–5.1)
SODIUM: 129 mmol/L — AB (ref 135–145)
Total Bilirubin: 0.5 mg/dL (ref 0.3–1.2)
Total Protein: 6 g/dL — ABNORMAL LOW (ref 6.5–8.1)

## 2015-09-23 LAB — CBC
HCT: 29.5 % — ABNORMAL LOW (ref 39.0–52.0)
HEMOGLOBIN: 9.7 g/dL — AB (ref 13.0–17.0)
MCH: 29.5 pg (ref 26.0–34.0)
MCHC: 32.9 g/dL (ref 30.0–36.0)
MCV: 89.7 fL (ref 78.0–100.0)
PLATELETS: 182 10*3/uL (ref 150–400)
RBC: 3.29 MIL/uL — ABNORMAL LOW (ref 4.22–5.81)
RDW: 13.6 % (ref 11.5–15.5)
WBC: 4.1 10*3/uL (ref 4.0–10.5)

## 2015-09-23 LAB — CBG MONITORING, ED
GLUCOSE-CAPILLARY: 281 mg/dL — AB (ref 65–99)
GLUCOSE-CAPILLARY: 438 mg/dL — AB (ref 65–99)
Glucose-Capillary: >600 mg/dL (ref 65–99)

## 2015-09-23 MED ORDER — INSULIN ASPART 100 UNIT/ML ~~LOC~~ SOLN
14.0000 [IU] | Freq: Once | SUBCUTANEOUS | Status: AC
  Administered 2015-09-23: 14 [IU] via SUBCUTANEOUS
  Filled 2015-09-23: qty 1

## 2015-09-23 MED ORDER — SODIUM CHLORIDE 0.9 % IV BOLUS (SEPSIS)
1000.0000 mL | Freq: Once | INTRAVENOUS | Status: AC
  Administered 2015-09-23: 1000 mL via INTRAVENOUS

## 2015-09-23 NOTE — ED Notes (Signed)
Bed: [id number] Expected date:  Expected time:  Means of arrival:  Comments: EMS M CBG High

## 2015-09-23 NOTE — ED Notes (Addendum)
Placed a call to pharmacy and was told hospital can't give out free medication if it is a chronic issue.  Pharmacy supplied information about the Match program, where patient can get medication for $3.00.   Informed Dr. Patria Maneampos

## 2015-09-23 NOTE — ED Notes (Signed)
Nurse drawing labs. 

## 2015-09-23 NOTE — ED Provider Notes (Signed)
CSN: [id number]     Arrival date & time 09/23/15  2113 History   First MD Initiated Contact with Patient 09/23/15 2123     Chief Complaint  Patient presents with  . Hyperglycemia     (Consider location/radiation/quality/duration/timing/severity/associated sxs/prior Treatment) Patient is a 64 y.o. male presenting with hyperglycemia. The history is provided by the patient.  Hyperglycemia Associated symptoms: no abdominal pain, no chest pain, no confusion, no dysuria, no fever, no polyuria, no shortness of breath and no vomiting   Patient with hx htn, dm, presents indicating blood sugar is high.  Several recent ED visits for same - pt indicates since prior d/c, he does have prescriptions for his meds, but has not went to pharmacy yet, therefore currently not taking any of his meds. Is not following any specific diet.  Prior charts note homelessness - pt indicates he does have an apartment that he stays in, and that he goes to the shelter for meals.  Pt denies any fever/chills. No recent nvd. Normal appetite. Denies uri c/o. No chest pain or sob. No dysuria, polyuria or gu c/o.       Past Medical History  Diagnosis Date  . Hypertension   . Diabetes mellitus   . Homelessness   . Periodontal disease   . Noncompliance with medications   . PTSD (post-traumatic stress disorder)   . Alcohol abuse, unspecified    History reviewed. No pertinent past surgical history. Family History  Problem Relation Age of Onset  . Diabetes Mellitus II Mother   . Diabetes Mellitus II Father   . Stroke Maternal Grandfather    Social History  Substance Use Topics  . Smoking status: Current Every Day Smoker -- 0.50 packs/day    Types: Cigarettes  . Smokeless tobacco: None  . Alcohol Use: Yes    Review of Systems  Constitutional: Negative for fever.  HENT: Negative for sore throat.   Eyes: Negative for redness.  Respiratory: Negative for shortness of breath.   Cardiovascular: Negative for chest  pain.  Gastrointestinal: Negative for vomiting, abdominal pain and diarrhea.  Endocrine: Negative for polyuria.  Genitourinary: Negative for dysuria and flank pain.  Musculoskeletal: Negative for back pain and neck pain.  Skin: Negative for rash.  Neurological: Negative for headaches.  Hematological: Does not bruise/bleed easily.  Psychiatric/Behavioral: Negative for confusion.      Allergies  Review of patient's allergies indicates no known allergies.  Home Medications   Prior to Admission medications   Medication Sig Start Date End Date Taking? Authorizing Provider  glipiZIDE (GLUCOTROL) 5 MG tablet Take 2 tablets (10 mg total) by mouth daily before breakfast. 09/22/15   Rolland PorterMark James, MD  hydrochlorothiazide (HYDRODIURIL) 25 MG tablet Take 1 tablet (25 mg total) by mouth daily. Patient not taking: Reported on 08/16/2015 07/31/15   Blane OharaJoshua Zavitz, MD  insulin aspart (NOVOLOG) 100 UNIT/ML injection Inject 6 units under the skin before meals 3 times per day 09/22/15   Rolland PorterMark James, MD   BP 176/100 mmHg  Pulse 88  Temp(Src) 98.6 F (37 C) (Oral)  Resp 18  Ht 6\' 1"  (1.854 m)  Wt 183 lb (83.008 kg)  BMI 24.15 kg/m2  SpO2 100% Physical Exam  Constitutional: He is oriented to person, place, and time. He appears well-developed and well-nourished. No distress.  HENT:  Head: Atraumatic.  Mouth/Throat: Oropharynx is clear and moist.  Eyes: Conjunctivae are normal. Pupils are equal, round, and reactive to light. No scleral icterus.  Neck: Neck supple. No tracheal  deviation present.  Cardiovascular: Normal rate, regular rhythm, normal heart sounds and intact distal pulses.  Exam reveals no gallop and no friction rub.   No murmur heard. Pulmonary/Chest: Effort normal and breath sounds normal. No accessory muscle usage. No respiratory distress.  Abdominal: Soft. Bowel sounds are normal. He exhibits no distension. There is no tenderness.  Musculoskeletal: Normal range of motion. He exhibits no  edema or tenderness.  Neurological: He is alert and oriented to person, place, and time.  Skin: Skin is warm and dry. No rash noted.  Psychiatric: He has a normal mood and affect.  Nursing note and vitals reviewed.   ED Course  Procedures (including critical care time) Labs Review  Results for orders placed or performed during the hospital encounter of 09/23/15  CBC  Result Value Ref Range   WBC 4.1 4.0 - 10.5 K/uL   RBC 3.29 (L) 4.22 - 5.81 MIL/uL   Hemoglobin 9.7 (L) 13.0 - 17.0 g/dL   HCT 16.1 (L) 09.6 - 04.5 %   MCV 89.7 78.0 - 100.0 fL   MCH 29.5 26.0 - 34.0 pg   MCHC 32.9 30.0 - 36.0 g/dL   RDW 40.9 81.1 - 91.4 %   Platelets 182 150 - 400 K/uL  Comprehensive metabolic panel  Result Value Ref Range   Sodium 129 (L) 135 - 145 mmol/L   Potassium 4.5 3.5 - 5.1 mmol/L   Chloride 97 (L) 101 - 111 mmol/L   CO2 26 22 - 32 mmol/L   Glucose, Bld 629 (HH) 65 - 99 mg/dL   BUN 24 (H) 6 - 20 mg/dL   Creatinine, Ser 7.82 (H) 0.61 - 1.24 mg/dL   Calcium 8.4 (L) 8.9 - 10.3 mg/dL   Total Protein 6.0 (L) 6.5 - 8.1 g/dL   Albumin 2.8 (L) 3.5 - 5.0 g/dL   AST 32 15 - 41 U/L   ALT 35 17 - 63 U/L   Alkaline Phosphatase 158 (H) 38 - 126 U/L   Total Bilirubin 0.5 0.3 - 1.2 mg/dL   GFR calc non Af Amer 47 (L) >60 mL/min   GFR calc Af Amer 55 (L) >60 mL/min   Anion gap 6 5 - 15  POC CBG, ED  Result Value Ref Range   Glucose-Capillary >600 (HH) 65 - 99 mg/dL       I have personally reviewed and evaluated these lab results as part of my medical decision-making.    MDM   Blood sugar reads high.  Iv ns bolus.  Labs.  Reviewed nursing notes and prior charts for additional history.   Additional iv ns.  novolog sq  Po fluids.  Will recheck cbg.  After 1 liter ns, bs 438.  Will plan recheck post 2nd liter.   abd soft nt. Tolerating po fluids.  Anticipate d/c post ivf - pt signed out to Dr Patria Mane.      Cathren Laine, MD 09/23/15 [phone]

## 2015-09-23 NOTE — ED Notes (Signed)
Per EMS, patient complains from feeling bad and arm weakness for two months.  He denies vision changes, nausea, and chest pain.    BP:  196/110 P: 88 R: 18  CBG:  High per EMS

## 2015-09-23 NOTE — Discharge Instructions (Signed)
It was our pleasure to provide your ER care today - we hope that you feel better.  Drink plenty of fluids/water.  Follow diabetic diet. Monitor blood sugars and record values.   Take your medications as prescribed.   Follow up with primary care doctor Monday - see referral to [facility].  Follow up with your blood pressure and blood sugar, as both are high.   Also follow up with social services Monday.  Return to ER if worse, new symptoms, fevers, vomiting, weak/fainting, medical emergency, other concern.    Hyperglycemia Hyperglycemia occurs when the glucose (sugar) in your blood is too high. Hyperglycemia can happen for many reasons, but it most often happens to people who do not know they have diabetes or are not managing their diabetes properly.  CAUSES  Whether you have diabetes or not, there are other causes of hyperglycemia. Hyperglycemia can occur when you have diabetes, but it can also occur in other situations that you might not be as aware of, such as: Diabetes  If you have diabetes and are having problems controlling your blood glucose, hyperglycemia could occur because of some of the following reasons:  Not following your meal plan.  Not taking your diabetes medications or not taking it properly.  Exercising less or doing less activity than you normally do.  Being sick. Pre-diabetes  This cannot be ignored. Before people develop Type 2 diabetes, they almost always have "pre-diabetes." This is when your blood glucose levels are higher than normal, but not yet high enough to be diagnosed as diabetes. Research has shown that some long-term damage to the body, especially the heart and circulatory system, may already be occurring during pre-diabetes. If you take action to manage your blood glucose when you have pre-diabetes, you may delay or prevent Type 2 diabetes from developing. Stress  If you have diabetes, you may be "diet" controlled or on oral medications or  insulin to control your diabetes. However, you may find that your blood glucose is higher than usual in the hospital whether you have diabetes or not. This is often referred to as "stress hyperglycemia." Stress can elevate your blood glucose. This happens because of hormones put out by the body during times of stress. If stress has been the cause of your high blood glucose, it can be followed regularly by your caregiver. That way he/she can make sure your hyperglycemia does not continue to get worse or progress to diabetes. Steroids  Steroids are medications that act on the infection fighting system (immune system) to block inflammation or infection. One side effect can be a rise in blood glucose. Most people can produce enough extra insulin to allow for this rise, but for those who cannot, steroids make blood glucose levels go even higher. It is not unusual for steroid treatments to "uncover" diabetes that is developing. It is not always possible to determine if the hyperglycemia will go away after the steroids are stopped. A special blood test called an A1c is sometimes done to determine if your blood glucose was elevated before the steroids were started. SYMPTOMS  Thirsty.  Frequent urination.  Dry mouth.  Blurred vision.  Tired or fatigue.  Weakness.  Sleepy.  Tingling in feet or leg. DIAGNOSIS  Diagnosis is made by monitoring blood glucose in one or all of the following ways:  A1c test. This is a chemical found in your blood.  Fingerstick blood glucose monitoring.  Laboratory results. TREATMENT  First, knowing the cause of the  hyperglycemia is important before the hyperglycemia can be treated. Treatment may include, but is not be limited to:  Education.  Change or adjustment in medications.  Change or adjustment in meal plan.  Treatment for an illness, infection, etc.  More frequent blood glucose monitoring.  Change in exercise plan.  Decreasing or stopping  steroids.  Lifestyle changes. HOME CARE INSTRUCTIONS   Test your blood glucose as directed.  Exercise regularly. Your caregiver will give you instructions about exercise. Pre-diabetes or diabetes which comes on with stress is helped by exercising.  Eat wholesome, balanced meals. Eat often and at regular, fixed times. Your caregiver or nutritionist will give you a meal plan to guide your sugar intake.  Being at an ideal weight is important. If needed, losing as little as 10 to 15 pounds may help improve blood glucose levels. SEEK MEDICAL CARE IF:   You have questions about medicine, activity, or diet.  You continue to have symptoms (problems such as increased thirst, urination, or weight gain). SEEK IMMEDIATE MEDICAL CARE IF:   You are vomiting or have diarrhea.  Your breath smells fruity.  You are breathing faster or slower.  You are very sleepy or incoherent.  You have numbness, tingling, or pain in your feet or hands.  You have chest pain.  Your symptoms get worse even though you have been following your caregiver's orders.  If you have any other questions or concerns.   This information is not intended to replace advice given to you by your health care provider. Make sure you discuss any questions you have with your health care provider.   Document Released: 05/21/2001 Document Revised: 02/17/2012 Document Reviewed: 08/01/2015 Elsevier Interactive Patient Education 2016 [employer].    Diabetes Mellitus and Food It is important for you to manage your blood sugar (glucose) level. Your blood glucose level can be greatly affected by what you eat. Eating healthier foods in the appropriate amounts throughout the day at about the same time each day will help you control your blood glucose level. It can also help slow or prevent worsening of your diabetes mellitus. Healthy eating may even help you improve the level of your blood pressure and reach or maintain a healthy  weight.  General recommendations for healthful eating and cooking habits include:  Eating meals and snacks regularly. Avoid going long periods of time without eating to lose weight.  Eating a diet that consists mainly of plant-based foods, such as fruits, vegetables, nuts, legumes, and whole grains.  Using low-heat cooking methods, such as baking, instead of high-heat cooking methods, such as deep frying. Work with your dietitian to make sure you understand how to use the Nutrition Facts information on food labels. HOW CAN FOOD AFFECT ME? Carbohydrates Carbohydrates affect your blood glucose level more than any other type of food. Your dietitian will help you determine how many carbohydrates to eat at each meal and teach you how to count carbohydrates. Counting carbohydrates is important to keep your blood glucose at a healthy level, especially if you are using insulin or taking certain medicines for diabetes mellitus. Alcohol Alcohol can cause sudden decreases in blood glucose (hypoglycemia), especially if you use insulin or take certain medicines for diabetes mellitus. Hypoglycemia can be a life-threatening condition. Symptoms of hypoglycemia (sleepiness, dizziness, and disorientation) are similar to symptoms of having too much alcohol.  If your health care provider has given you approval to drink alcohol, do so in moderation and use the following guidelines:  Women  should not have more than one drink per day, and men should not have more than two drinks per day. One drink is equal to:  12 oz of beer.  5 oz of wine.  1 oz of hard liquor.  Do not drink on an empty stomach.  Keep yourself hydrated. Have water, diet soda, or unsweetened iced tea.  Regular soda, juice, and other mixers might contain a lot of carbohydrates and should be counted. WHAT FOODS ARE NOT RECOMMENDED? As you make food choices, it is important to remember that all foods are not the same. Some foods have fewer  nutrients per serving than other foods, even though they might have the same number of calories or carbohydrates. It is difficult to get your body what it needs when you eat foods with fewer nutrients. Examples of foods that you should avoid that are high in calories and carbohydrates but low in nutrients include:  Trans fats (most processed foods list trans fats on the Nutrition Facts label).  Regular soda.  Juice.  Candy.  Sweets, such as cake, pie, doughnuts, and cookies.  Fried foods. WHAT FOODS CAN I EAT? Eat nutrient-rich foods, which will nourish your body and keep you healthy. The food you should eat also will depend on several factors, including:  The calories you need.  The medicines you take.  Your weight.  Your blood glucose level.  Your blood pressure level.  Your cholesterol level. You should eat a variety of foods, including:  Protein.  Lean cuts of meat.  Proteins low in saturated fats, such as fish, egg whites, and beans. Avoid processed meats.  Fruits and vegetables.  Fruits and vegetables that may help control blood glucose levels, such as apples, mangoes, and yams.  Dairy products.  Choose fat-free or low-fat dairy products, such as milk, yogurt, and cheese.  Grains, bread, pasta, and rice.  Choose whole grain products, such as multigrain bread, whole oats, and brown rice. These foods may help control blood pressure.  Fats.  Foods containing healthful fats, such as nuts, avocado, olive oil, canola oil, and fish. DOES EVERYONE WITH DIABETES MELLITUS HAVE THE SAME MEAL PLAN? Because every person with diabetes mellitus is different, there is not one meal plan that works for everyone. It is very important that you meet with a dietitian who will help you create a meal plan that is just right for you.   This information is not intended to replace advice given to you by your health care provider. Make sure you discuss any questions you have with your  health care provider.   Document Released: 08/22/2005 Document Revised: 12/16/2014 Document Reviewed: 10/22/2013 Elsevier Interactive Patient Education 2016 [employer]   Hypertension Hypertension, commonly called high blood pressure, is when the force of blood pumping through your arteries is too strong. Your arteries are the blood vessels that carry blood from your heart throughout your body. A blood pressure reading consists of a higher number over a lower number, such as 110/72. The higher number (systolic) is the pressure inside your arteries when your heart pumps. The lower number (diastolic) is the pressure inside your arteries when your heart relaxes. Ideally you want your blood pressure below 120/80. Hypertension forces your heart to work harder to pump blood. Your arteries may become narrow or stiff. Having untreated or uncontrolled hypertension can cause heart attack, stroke, kidney disease, and other problems. RISK FACTORS Some risk factors for high blood pressure are controllable. Others are not.  Risk factors  you cannot control include:   Race. You may be at higher risk if you are African American.  Age. Risk increases with age.  Gender. Men are at higher risk than women before age 71 years. After age 42, women are at higher risk than men. Risk factors you can control include:  Not getting enough exercise or physical activity.  Being overweight.  Getting too much fat, sugar, calories, or salt in your diet.  Drinking too much alcohol. SIGNS AND SYMPTOMS Hypertension does not usually cause signs or symptoms. Extremely high blood pressure (hypertensive crisis) may cause headache, anxiety, shortness of breath, and nosebleed. DIAGNOSIS To check if you have hypertension, your health care provider will measure your blood pressure while you are seated, with your arm held at the level of your heart. It should be measured at least twice using the same arm. Certain conditions can  cause a difference in blood pressure between your right and left arms. A blood pressure reading that is higher than normal on one occasion does not mean that you need treatment. If it is not clear whether you have high blood pressure, you may be asked to return on a different day to have your blood pressure checked again. Or, you may be asked to monitor your blood pressure at home for 1 or more weeks. TREATMENT Treating high blood pressure includes making lifestyle changes and possibly taking medicine. Living a healthy lifestyle can help lower high blood pressure. You may need to change some of your habits. Lifestyle changes may include:  Following the DASH diet. This diet is high in fruits, vegetables, and whole grains. It is low in salt, red meat, and added sugars.  Keep your sodium intake below 2,300 mg per day.  Getting at least 30-45 minutes of aerobic exercise at least 4 times per week.  Losing weight if necessary.  Not smoking.  Limiting alcoholic beverages.  Learning ways to reduce stress. Your health care provider may prescribe medicine if lifestyle changes are not enough to get your blood pressure under control, and if one of the following is true:  You are 42-23 years of age and your systolic blood pressure is above 140.  You are 35 years of age or older, and your systolic blood pressure is above 150.  Your diastolic blood pressure is above 90.  You have diabetes, and your systolic blood pressure is over 140 or your diastolic blood pressure is over 90.  You have kidney disease and your blood pressure is above 140/90.  You have heart disease and your blood pressure is above 140/90. Your personal target blood pressure may vary depending on your medical conditions, your age, and other factors. HOME CARE INSTRUCTIONS  Have your blood pressure rechecked as directed by your health care provider.   Take medicines only as directed by your health care provider. Follow the  directions carefully. Blood pressure medicines must be taken as prescribed. The medicine does not work as well when you skip doses. Skipping doses also puts you at risk for problems.  Do not smoke.   Monitor your blood pressure at home as directed by your health care provider. SEEK MEDICAL CARE IF:   You think you are having a reaction to medicines taken.  You have recurrent headaches or feel dizzy.  You have swelling in your ankles.  You have trouble with your vision. SEEK IMMEDIATE MEDICAL CARE IF:  You develop a severe headache or confusion.  You have unusual weakness, numbness, or feel  faint.  You have severe chest or abdominal pain.  You vomit repeatedly.  You have trouble breathing. MAKE SURE YOU:   Understand these instructions.  Will watch your condition.  Will get help right away if you are not doing well or get worse.   This information is not intended to replace advice given to you by your health care provider. Make sure you discuss any questions you have with your health care provider.   Document Released: 11/25/2005 Document Revised: 04/11/2015 Document Reviewed: 09/17/2013 Elsevier Interactive Patient Education 2016 [employer].      Emergency Department Resource Guide 1) Find a Doctor and Pay Out of Pocket Although you won't have to find out who is covered by your insurance plan, it is a good idea to ask around and get recommendations. You will then need to call the office and see if the doctor you have chosen will accept you as a new patient and what types of options they offer for patients who are self-pay. Some doctors offer discounts or will set up payment plans for their patients who do not have insurance, but you will need to ask so you aren't surprised when you get to your appointment.  2) Contact Your Local Health Department Not all health departments have doctors that can see patients for sick visits, but many do, so it is worth a call to see  if yours does. If you don't know where your local health department is, you can check in your phone book. The CDC also has a tool to help you locate your state's health department, and many state websites also have listings of all of their local health departments.  3) Find a Walk-in Clinic If your illness is not likely to be very severe or complicated, you may want to try a walk in clinic. These are popping up all over the country in pharmacies, drugstores, and shopping centers. They're usually staffed by nurse practitioners or physician assistants that have been trained to treat common illnesses and complaints. They're usually fairly quick and inexpensive. However, if you have serious medical issues or chronic medical problems, these are probably not your best option.  No Primary Care Doctor: - Call Health Connect at  [phone] - they can help you locate a primary care doctor that  accepts your insurance, provides certain services, etc. - Physician Referral Service- [phone]  Chronic Pain Problems: Organization         Address  Phone   Notes  [facility]  [phone] Patients need to be referred by their primary care doctor.   Medication Assistance: Organization         Address  Phone   Notes  [facility] [address] [place] [place], [place] [address] [phone] --Must be a resident of [facility] -- Must have NO insurance coverage whatsoever (no Medicaid/ Medicare, etc.) -- The pt. MUST have a primary care doctor that directs their care regularly and follows them in the community   [facility]  [phone]   [employer]  [phone]    Agencies that provide inexpensive medical care: Organization         Address  Phone   Notes  [facility]  [phone]   [facility]    [phone]   [facility] [address] [place], [place] [address] [phone]   [facility] [address]  [address], [place] [phone]   Planned Parenthood    [phone]   [facility]    [phone]   [facility]  [address], [place] Phone:  [phone], Fax:  [phone] Hours of Operation:  9 am - 6 pm, M-F.  Also accepts Medicaid/Medicare and self-pay.  [facility]  [address], [place] Phone: [phone], Fax: [phone]. Hours of Operation:  8:30 am - 5:30 pm, M-F.  Also accepts Medicaid and self-pay.  [facility] [place] [address] [place] Phone: [phone]   [facility] [address] Natasha Bence [place], [place] [phone], Ext. [phone] Mondays & Thursdays: 7-9 AM.  First 15 patients are seen on a first come, first serve basis.    Medicaid-accepting [facility] Providers:  Organization         Address  Phone   Notes  [facility] [address], [place] [phone] Also accepts self-pay patients.  [facility] [address] Laurell Josephs [place], [place]  [phone]   [facility] [address], [place] [phone]   [facility] [address], [place] [phone]   Renaye Rakers [address], [place]   [phone] Only accepts [place] patients after they have their name applied to their card.   Self-Pay (no insurance) in [facility]:  Organization         Address  Phone   Notes  Sickle Cell Patients, [facility] [address] [place], [place] [phone]   [facility] [address] [place], [place] [phone]   [facility] [place]  [address], [place] [phone]   [facility]/Dr. Osei-Bonsu  [address] [place] or [address], [place] [phone] Phone number for both [place] and [place] locations is the same.  [facility] [address], [place] [phone]   [facility] [address], [place] or [address] [phone]   [facility] [address], [place] [phone], phone; [phone], fax Sees patients 1st and 3rd Saturday of every month.  Must not qualify for public or private insurance (i.e. Medicaid, Medicare, [place] Health Choice, Veterans' Benefits)  Household income should be no more than 200% of the poverty level The clinic cannot treat you if you are pregnant or think you are pregnant  Sexually transmitted diseases are not treated at the clinic.    Dental Care: Organization         Address  Phone  Notes  [facility] Department of [facility] [address] [place], [place] [phone] Accepts children up to age 57 who are enrolled in [place] or [place] Health Choice; pregnant women with a Medicaid card; and children who have applied for Medicaid or [place] Health Choice, but were declined, whose parents can pay a reduced fee at time of service.  [facility] Department of [facility]  [address], [place] [phone] Accepts children up to age 40 who are enrolled in [place] or [place] Health Choice; pregnant women with a Medicaid card; and children who have applied for Medicaid or [place]  Health Choice, but were declined, whose parents can pay a reduced fee at time of service.  [facility]  [address] [place], [place] [phone] Patients are seen by appointment only. Walk-ins are not accepted. [facility] will see patients 22 years of age and older. Monday - Tuesday (8am-5pm) Most Wednesdays (8:30-5pm) $30 per visit, cash only  [facility]  [address], [facility] [phone] Patients are  seen by appointment only. Walk-ins are not accepted. [facility] will see patients 33 years of age and older. One Wednesday Evening (Monthly: Volunteer Based).  $30 per visit, cash only  [employer]  [phone] for adults; Children under age 3, call Graduate Pediatric Dentistry at [phone]. Children aged 53-14, please call [phone] to request a pediatric application.  Dental services are provided in all areas of dental care including fillings, crowns and bridges, complete and partial dentures, implants, gum treatment, root canals, and extractions. Preventive care is also provided. Treatment is provided to both adults and children. Patients are selected via a lottery and there is often a waiting list.   [facility] [address], [place]  [phone] [email or web address]   [facility] [address] [place], [place] [phone], Ext. [phone] Second and Fourth Thursday of each month, opens at 6:30 AM; Clinic ends at 9 AM.  Patients are seen on a first-come first-served basis, and a limited number are seen during each clinic.   [facility]  [address] Ether Griffins [place], [place] [phone]   Eligibility Requirements You must have lived in [place], [place], or [place] counties for at least the last three months.   You cannot be eligible for state or federal sponsored [place], including [employer], [place], or [employer].   You generally cannot be eligible for healthcare insurance through your employer.    How to apply: Eligibility screenings are held every Tuesday and Wednesday afternoon from 1:00 pm until 4:00 pm. You do not need an appointment for the interview!  [facility] [address], [place], [place] [phone]   [facility]  [phone]   [facility]  [phone]   [facility]  [phone]     Behavioral Health Resources in the Community: Intensive Outpatient Programs Organization         Address  Phone  Notes  [facility] [address] [place], [place] [phone]   [facility] Outpatient [address] [place], [place] [phone]   [facility] [address], [place], [place]  [phone]   [facility] Mental Health [address],  [place], [place] [phone] or [phone]   Substance Abuse Resources Organization         Address  Phone  Notes  [facility]  [phone]   [facility]  [phone]   [facility]  2286291033   Floydene Flock  [phone]   Residential & Outpatient Substance Abuse Program  [phone]   Psychological Services Organization         Address  Phone  Notes  [facility]  [phone]   [facility] Services  [phone]   [facility] [address], [place] [phone] or [phone]    Mobile Crisis Teams Organization         Address  Phone  Notes  [facility], Mobile Crisis Care Unit  [phone]   [facility]  [address] [place], [place] [phone]   [facility] [address] [place] [phone]    Self-Help/Support Groups Organization         Address  Phone             Notes  [facility] - variety of support groups  336- I7437963 Call for more information  Narcotics Anonymous (NA), [facility] [address], [employer] [place]  2 meetings at this location   Statistician         Address  Phone  Notes  [facility] [address],    [place]  [phone]   [facility]  [address] [place] [address], [place], [place] [phone]   [facility] [address] [place], [place] [phone] Admissions: 8am-3pm M-F  Incentives  [facility] [address],    [place], [place] [phone]   [facility] [address] [place], [place], [place] [phone]   [facility] [address],  [place], [place] [phone]   [facility] Intensive Outpatient [address], [place], [place] [phone]   [facility] ([facility] [address] [place]  [place], [place] [phone] or [phone]   [facility] ([facility]) [address] [place], [place] [phone] Accepts Medicaid  [facility] [address]  [place] [phone] Substance Abuse/Addiction Treatment   [facility]         Address  Phone  Notes  [facility]  [phone]   Angie Fava, PhD [address] [place], [place]   ([phone] or [phone]   [facility]   [address] [place], [place] [phone]   [facility] [address], [place], [place] [phone] Insurance/Medicaid/sponsorship through [facility] [address]                                    [place], [place] [phone] Therapy/tele-psych/case  [facility] [address] [place], [place] [phone]    Dr. Lolly Mustache  [phone]   [facility]  [employer] [facility] Dept. 1) [address], [place] 2) [address], [place] 3)  [address], [place] [phone]  [phone]   [facility] Child Abuse Hotline 848-735-5549 or [phone] (After Hours)

## 2015-09-24 ENCOUNTER — Emergency Department (HOSPITAL_COMMUNITY)
Admission: EM | Admit: 2015-09-24 | Discharge: 2015-09-24 | Disposition: A | Discharge status: Home / Self Care | Payer: Medicaid Other | Attending: Emergency Medicine | Admitting: Emergency Medicine

## 2015-09-24 ENCOUNTER — Encounter (HOSPITAL_COMMUNITY): Payer: Self-pay | Admitting: Emergency Medicine

## 2015-09-24 DIAGNOSIS — E1165 Type 2 diabetes mellitus with hyperglycemia: Secondary | ICD-10-CM

## 2015-09-24 DIAGNOSIS — Z9114 Patient's other noncompliance with medication regimen: Secondary | ICD-10-CM

## 2015-09-24 DIAGNOSIS — Z8659 Personal history of other mental and behavioral disorders: Secondary | ICD-10-CM | POA: Insufficient documentation

## 2015-09-24 DIAGNOSIS — I10 Essential (primary) hypertension: Secondary | ICD-10-CM | POA: Diagnosis not present

## 2015-09-24 DIAGNOSIS — Z9119 Patient's noncompliance with other medical treatment and regimen: Secondary | ICD-10-CM | POA: Diagnosis not present

## 2015-09-24 DIAGNOSIS — Z720 Tobacco use: Secondary | ICD-10-CM | POA: Insufficient documentation

## 2015-09-24 DIAGNOSIS — Z794 Long term (current) use of insulin: Secondary | ICD-10-CM | POA: Insufficient documentation

## 2015-09-24 DIAGNOSIS — Z8719 Personal history of other diseases of the digestive system: Secondary | ICD-10-CM | POA: Insufficient documentation

## 2015-09-24 DIAGNOSIS — Z79899 Other long term (current) drug therapy: Secondary | ICD-10-CM | POA: Insufficient documentation

## 2015-09-24 DIAGNOSIS — E118 Type 2 diabetes mellitus with unspecified complications: Secondary | ICD-10-CM | POA: Diagnosis not present

## 2015-09-24 DIAGNOSIS — G629 Polyneuropathy, unspecified: Secondary | ICD-10-CM | POA: Diagnosis present

## 2015-09-24 NOTE — ED Provider Notes (Signed)
CSN: [id number]     Arrival date & time 09/24/15  0309 History   First MD Initiated Contact with Patient 09/24/15 [phone]     Chief Complaint  Patient presents with  . Peripheral Neuropathy      HPI Pt just discharged from ER for hyperglycemia, noncompliance with medications and limitations obtaining medications. Has been sitting in the ER waiting room and checked back in complaining of neuropathy in his right hand, present for years. Requesting something to help with pain. No other complaints. Does not have a way home. Was hoping to sleep in an ER bed until the buses begin to run   Past Medical History  Diagnosis Date  . Hypertension   . Diabetes mellitus   . Homelessness   . Periodontal disease   . Noncompliance with medications   . PTSD (post-traumatic stress disorder)   . Alcohol abuse, unspecified    No past surgical history on file. Family History  Problem Relation Age of Onset  . Diabetes Mellitus II Mother   . Diabetes Mellitus II Father   . Stroke Maternal Grandfather    Social History  Substance Use Topics  . Smoking status: Current Every Day Smoker -- 0.50 packs/day    Types: Cigarettes  . Smokeless tobacco: Not on file  . Alcohol Use: Yes    Review of Systems  All other systems reviewed and are negative.     Allergies  Review of patient's allergies indicates no known allergies.  Home Medications   Prior to Admission medications   Medication Sig Start Date End Date Taking? Authorizing Provider  glipiZIDE (GLUCOTROL) 5 MG tablet Take 2 tablets (10 mg total) by mouth daily before breakfast. 09/22/15   Rolland PorterMark James, MD  hydrochlorothiazide (HYDRODIURIL) 25 MG tablet Take 1 tablet (25 mg total) by mouth daily. Patient not taking: Reported on 08/16/2015 07/31/15   Blane OharaJoshua Zavitz, MD  insulin aspart (NOVOLOG) 100 UNIT/ML injection Inject 6 units under the skin before meals 3 times per day 09/22/15   Rolland PorterMark James, MD   BP 177/100 mmHg  Pulse 87  Temp(Src) 98.1 F  (36.7 C) (Oral)  Resp 18  SpO2 100% Physical Exam  Constitutional: He is oriented to person, place, and time. He appears well-developed and well-nourished.  HENT:  Head: Normocephalic.  Eyes: EOM are normal.  Neck: Normal range of motion.  Pulmonary/Chest: Effort normal.  Abdominal: He exhibits no distension.  Musculoskeletal: Normal range of motion.  Neurological: He is alert and oriented to person, place, and time.  Psychiatric: He has a normal mood and affect.  Nursing note and vitals reviewed.   ED Course  Procedures (including critical care time) Labs Review Labs Reviewed - No data to display  Imaging Review No results found. I have personally reviewed and evaluated these images and lab results as part of my medical decision-making.   EKG Interpretation None      MDM   Final diagnoses:  H/O medication noncompliance  Uncontrolled type 2 diabetes mellitus with complication, unspecified long term insulin use status [facility](HCC)    Medical screening examination complete. Dc home with pcp follow up. No change to discharge instructions. Has prescriptions for all of his medications and follow up instructions with [facility]    Azalia BilisKevin Shantella Blubaugh, MD 09/24/15 [phone]

## 2015-09-24 NOTE — ED Notes (Signed)
Pt arrived to the ED with a complaint of peripheral neurapathy.  Pt states he is using insulin for the pain without relief.

## 2015-09-28 ENCOUNTER — Encounter (HOSPITAL_COMMUNITY): Payer: Self-pay | Admitting: General Practice

## 2015-09-28 ENCOUNTER — Emergency Department (HOSPITAL_COMMUNITY)
Admission: EM | Admit: 2015-09-28 | Discharge: 2015-09-28 | Discharge status: Against Medical Advice | Payer: Medicaid Other | Attending: Physician Assistant | Admitting: Physician Assistant

## 2015-09-28 DIAGNOSIS — Z9119 Patient's noncompliance with other medical treatment and regimen: Secondary | ICD-10-CM

## 2015-09-28 DIAGNOSIS — Z8659 Personal history of other mental and behavioral disorders: Secondary | ICD-10-CM | POA: Insufficient documentation

## 2015-09-28 DIAGNOSIS — E119 Type 2 diabetes mellitus without complications: Secondary | ICD-10-CM | POA: Diagnosis not present

## 2015-09-28 DIAGNOSIS — Z720 Tobacco use: Secondary | ICD-10-CM | POA: Insufficient documentation

## 2015-09-28 DIAGNOSIS — I10 Essential (primary) hypertension: Secondary | ICD-10-CM | POA: Diagnosis present

## 2015-09-28 DIAGNOSIS — Z8719 Personal history of other diseases of the digestive system: Secondary | ICD-10-CM | POA: Diagnosis not present

## 2015-09-28 DIAGNOSIS — Z91199 Patient's noncompliance with other medical treatment and regimen due to unspecified reason: Secondary | ICD-10-CM

## 2015-09-28 DIAGNOSIS — Z590 Homelessness: Secondary | ICD-10-CM | POA: Insufficient documentation

## 2015-09-28 LAB — CBC WITH DIFFERENTIAL/PLATELET
BASOS ABS: 0 10*3/uL (ref 0.0–0.1)
BASOS PCT: 0 %
EOS ABS: 0.2 10*3/uL (ref 0.0–0.7)
Eosinophils Relative: 4 %
HCT: 31.4 % — ABNORMAL LOW (ref 39.0–52.0)
Hemoglobin: 10.6 g/dL — ABNORMAL LOW (ref 13.0–17.0)
Lymphocytes Relative: 18 %
Lymphs Abs: 1 10*3/uL (ref 0.7–4.0)
MCH: 29.8 pg (ref 26.0–34.0)
MCHC: 33.8 g/dL (ref 30.0–36.0)
MCV: 88.2 fL (ref 78.0–100.0)
MONO ABS: 0.2 10*3/uL (ref 0.1–1.0)
Monocytes Relative: 4 %
Neutro Abs: 4 10*3/uL (ref 1.7–7.7)
Neutrophils Relative %: 74 %
PLATELETS: 187 10*3/uL (ref 150–400)
RBC: 3.56 MIL/uL — AB (ref 4.22–5.81)
RDW: 13.4 % (ref 11.5–15.5)
WBC: 5.4 10*3/uL (ref 4.0–10.5)

## 2015-09-28 LAB — I-STAT VENOUS BLOOD GAS, ED
Acid-Base Excess: 3 mmol/L — ABNORMAL HIGH (ref 0.0–2.0)
Bicarbonate: 29.7 mEq/L — ABNORMAL HIGH (ref 20.0–24.0)
O2 SAT: 52 %
PCO2 VEN: 53.3 mmHg — AB (ref 45.0–50.0)
TCO2: 31 mmol/L (ref 0–100)
pH, Ven: 7.354 — ABNORMAL HIGH (ref 7.250–7.300)
pO2, Ven: 29 mmHg — CL (ref 30.0–45.0)

## 2015-09-28 LAB — BLOOD GAS, VENOUS

## 2015-09-28 LAB — COMPREHENSIVE METABOLIC PANEL
ALT: 39 U/L (ref 17–63)
AST: 37 U/L (ref 15–41)
Albumin: 2.6 g/dL — ABNORMAL LOW (ref 3.5–5.0)
Alkaline Phosphatase: 152 U/L — ABNORMAL HIGH (ref 38–126)
Anion gap: 7 (ref 5–15)
BUN: 25 mg/dL — AB (ref 6–20)
CHLORIDE: 95 mmol/L — AB (ref 101–111)
CO2: 28 mmol/L (ref 22–32)
CREATININE: 1.75 mg/dL — AB (ref 0.61–1.24)
Calcium: 9 mg/dL (ref 8.9–10.3)
GFR calc Af Amer: 46 mL/min — ABNORMAL LOW (ref >60)
GFR, EST NON AFRICAN AMERICAN: 40 mL/min — AB (ref >60)
Glucose, Bld: 620 mg/dL (ref 65–99)
POTASSIUM: 4.4 mmol/L (ref 3.5–5.1)
SODIUM: 130 mmol/L — AB (ref 135–145)
Total Bilirubin: 0.5 mg/dL (ref 0.3–1.2)
Total Protein: 5.7 g/dL — ABNORMAL LOW (ref 6.5–8.1)

## 2015-09-28 LAB — ETHANOL

## 2015-09-28 LAB — I-STAT TROPONIN, ED: TROPONIN I, POC: 0.07 ng/mL (ref 0.00–0.08)

## 2015-09-28 LAB — BETA-HYDROXYBUTYRIC ACID: BETA-HYDROXYBUTYRIC ACID: 0.34 mmol/L — AB (ref 0.05–0.27)

## 2015-09-28 LAB — CBG MONITORING, ED
GLUCOSE-CAPILLARY: 543 mg/dL — AB (ref 65–99)
GLUCOSE-CAPILLARY: 588 mg/dL — AB (ref 65–99)
Glucose-Capillary: 597 mg/dL (ref 65–99)

## 2015-09-28 LAB — I-STAT CG4 LACTIC ACID, ED: Lactic Acid, Venous: 0.89 mmol/L (ref 0.5–2.0)

## 2015-09-28 MED ORDER — INSULIN ASPART 100 UNIT/ML ~~LOC~~ SOLN
6.0000 [IU] | Freq: Once | SUBCUTANEOUS | Status: AC
  Administered 2015-09-28: 6 [IU] via SUBCUTANEOUS
  Filled 2015-09-28: qty 1

## 2015-09-28 MED ORDER — HYDRALAZINE HCL 20 MG/ML IJ SOLN
10.0000 mg | Freq: Once | INTRAMUSCULAR | Status: AC
  Administered 2015-09-28: 10 mg via INTRAVENOUS
  Filled 2015-09-28: qty 1

## 2015-09-28 MED ORDER — SODIUM CHLORIDE 0.9 % IV BOLUS (SEPSIS)
1000.0000 mL | Freq: Once | INTRAVENOUS | Status: AC
  Administered 2015-09-28: 1000 mL via INTRAVENOUS

## 2015-09-28 MED ORDER — HYDROCHLOROTHIAZIDE 25 MG PO TABS
25.0000 mg | ORAL_TABLET | Freq: Every day | ORAL | Status: DC
  Administered 2015-09-28: 25 mg via ORAL
  Filled 2015-09-28: qty 1

## 2015-09-28 NOTE — Discharge Instructions (Signed)
Please return whenever you need more medical care. Please follow-up with the appointment that we spent a long time setting up for you today.  YOu will need a new Medicaid car and they will be able to get you that card.VERY IMPORTANT to go to that appointment at 1045 on November 1.

## 2015-09-28 NOTE — Care Management Note (Signed)
Case Management Note  Patient Details  Name: Clayton [name] MRN: [id number] Date of Birth: May 11, 1951  Subjective/Objective:                  64 year old male chronically noncompliant, PTSD, alcohol abuse, diabetes, undomicilied resenting today without taking any medications here for his diabetes and peripheral neuropathy. Patient comes here often. He does not fill his medications. His primary care physician is in [place].//Home alone- apartment for homeless.  Action/Plan: Follow for disposition needs.   Expected Discharge Date:        09/28/15          Expected Discharge Plan:  Home/Self Care  In-House Referral:  PCP / Health Connect  Discharge planning Services  CM Consult, Follow-up appt scheduled  Post Acute Care Choice:  NA Choice offered to:  NA  DME Arranged:  N/A DME Agency:  NA  HH Arranged:  NA HH Agency:  NA  Status of Service:  Completed, signed off  Medicare Important Message Given:    Date Medicare IM Given:    Medicare IM give by:    Date Additional Medicare IM Given:    Additional Medicare Important Message give by:     If discussed at [place] of Stay Meetings, dates discussed:    Additional Comments: Lamiya Naas J. Clydene Laming, RN, BSN, [place] [phone] ED CM consulted regarding PCP establishment and medication assistance. Pt presented to [facility] ED today with high blood sugar. NCM met with pt at bedside; pt confirms not having access to f/u care with PCP; but pt has Medicaid through [facility] Discussed with patient importance and benefits of establishing PCP in [facility] and changing Medicaid card through [employer] worker, and not utilizing the ED for primary care needs. Pt verbalized understanding and is in agreement.  Pt voiced interest in the [facility] and [place].  NCM advised that [facility]  Internal Medicine providers are seeing pts at [facility]. Pt verbalized understanding. NCM set up appointment with Cammie Sickle, NP 11/1 at  1045.  Fuller Mandril, RN 09/28/2015, 2:19 PM

## 2015-09-28 NOTE — ED Notes (Signed)
Patient wishes to leave.  Rechecked CBG.  597.  Informed MD.  Attempted to persuade patient to stay and receive further treatment but patient adamantly refused.  Advised patient that his condition could worsen.  Informed patient that he is welcome to come back at any time.  Physician notified and will come see patient before he leaves.

## 2015-09-28 NOTE — ED Notes (Signed)
Patient decided to stay. 

## 2015-09-28 NOTE — ED Notes (Signed)
Patient called out "have the doctor sign my paperwork I want to go home"

## 2015-09-28 NOTE — ED Notes (Signed)
Patient now yelling at nurse, not wanting to be stuck for blood and  expressing desire to leave.

## 2015-09-28 NOTE — ED Provider Notes (Signed)
CSN: [id number]     Arrival date & time 09/28/15  [id number] History   First MD Initiated Contact with Patient 09/28/15 [phone]     Chief Complaint  Patient presents with  . Hypertension  . Extremity Pain     (Consider location/radiation/quality/duration/timing/severity/associated sxs/prior Treatment) HPI   Patient is a 64 year old male chronically noncompliant, PTSD, alcohol abuse, diabetes, undomicilied resenting today without taking any medications here for his diabetes and peripheral neuropathy. Patient comes here often. He does not fill his medications. He has no primary care physician.  Patient denies any chest pan. Denies taking any medications that he supposed to be taking.  Past Medical History  Diagnosis Date  . Hypertension   . Diabetes mellitus   . Homelessness   . Periodontal disease   . Noncompliance with medications   . PTSD (post-traumatic stress disorder)   . Alcohol abuse, unspecified    History reviewed. No pertinent past surgical history. Family History  Problem Relation Age of Onset  . Diabetes Mellitus II Mother   . Diabetes Mellitus II Father   . Stroke Maternal Grandfather    Social History  Substance Use Topics  . Smoking status: Current Every Day Smoker -- 0.50 packs/day    Types: Cigarettes  . Smokeless tobacco: None  . Alcohol Use: Yes    Review of Systems  Constitutional: Negative for fever and activity change.  HENT: Negative for drooling and hearing loss.   Eyes: Negative for discharge and redness.  Respiratory: Negative for cough and shortness of breath.   Cardiovascular: Negative for chest pain.  Gastrointestinal: Negative for abdominal pain.  Genitourinary: Negative for dysuria and urgency.  Musculoskeletal: Negative for arthralgias.  Allergic/Immunologic: Negative for immunocompromised state.  Neurological: Negative for seizures and speech difficulty.  Psychiatric/Behavioral: Negative for behavioral problems and agitation.  All other  systems reviewed and are negative.     Allergies  Review of patient's allergies indicates no known allergies.  Home Medications   Prior to Admission medications   Not on File   BP 185/115 mmHg  Pulse 81  Temp(Src) 98.6 F (37 C) (Oral)  Resp 18  SpO2 100% Physical Exam  Constitutional: He is oriented to person, place, and time. He appears well-nourished.  unkempt  HENT:  Head: Normocephalic.  Mouth/Throat: Oropharynx is clear and moist.  Eyes: Conjunctivae are normal.  Neck: No tracheal deviation present.  Cardiovascular: Normal rate.   Pulmonary/Chest: Effort normal. No stridor. No respiratory distress.  Abdominal: Soft. There is no tenderness. There is no guarding.  Musculoskeletal: Normal range of motion. He exhibits edema.  Old sores on bilateral lower extremities diffusely.  Neurological: He is oriented to person, place, and time. No cranial nerve deficit.  Skin: Skin is warm and dry. He is not diaphoretic.  Psychiatric: He has a normal mood and affect. His behavior is normal.  Nursing note and vitals reviewed.   ED Course  Procedures (including critical care time) Labs Review Labs Reviewed  CBC WITH DIFFERENTIAL/PLATELET - Abnormal; Notable for the following:    RBC 3.56 (*)    Hemoglobin 10.6 (*)    HCT 31.4 (*)    All other components within normal limits  COMPREHENSIVE METABOLIC PANEL - Abnormal; Notable for the following:    Sodium 130 (*)    Chloride 95 (*)    Glucose, Bld 620 (*)    BUN 25 (*)    Creatinine, Ser 1.75 (*)    Total Protein 5.7 (*)    Albumin 2.6 (*)  Alkaline Phosphatase 152 (*)    GFR calc non Af Amer 40 (*)    GFR calc Af Amer 46 (*)    All other components within normal limits  BETA-HYDROXYBUTYRIC ACID - Abnormal; Notable for the following:    Beta-Hydroxybutyric Acid 0.34 (*)    All other components within normal limits  CBG MONITORING, ED - Abnormal; Notable for the following:    Glucose-Capillary 543 (*)    All other  components within normal limits  I-STAT VENOUS BLOOD GAS, ED - Abnormal; Notable for the following:    pH, Ven 7.354 (*)    pCO2, Ven 53.3 (*)    pO2, Ven 29.0 (*)    Bicarbonate 29.7 (*)    Acid-Base Excess 3.0 (*)    All other components within normal limits  CBG MONITORING, ED - Abnormal; Notable for the following:    Glucose-Capillary 588 (*)    All other components within normal limits  CBG MONITORING, ED - Abnormal; Notable for the following:    Glucose-Capillary 597 (*)    All other components within normal limits  ETHANOL  BLOOD GAS, VENOUS  I-STAT TROPOININ, ED  I-STAT CG4 LACTIC ACID, ED    Imaging Review No results found. I have personally reviewed and evaluated these images and lab results as part of my medical decision-making.   EKG Interpretation   Date/Time:  Thursday September 28 2015 09:19:53 EDT Ventricular Rate:  83 PR Interval:  149 QRS Duration: 145 QT Interval:  409 QTC Calculation: 481 R Axis:   80 Text Interpretation:  Sinus rhythm Probable left atrial enlargement Right  bundle branch block Baseline wander in lead(s) V4 No significant change  since last tracing Right bundle branch block Confirmed by Kandis Mannan ([id number]) on 09/28/2015 9:31:08 AM      MDM   Final diagnoses:  Medical non-compliance    Patient is a 64 year old male presenting with medical noncompliance. Patient is homeless, chronically noncompliant, chronic alcohol abuse presenting today with not taking his medications. Today patient concerned about his sugar. And peripheral neuropathy. Bedside glucose is 560. We will give fluids, assess for DKA with VBG and beta hydroxy butyrate.  Perhaps the most intervention today was that I had a long talk with patient about his barrier to health care. A big barrier for him is not having a primary care physician within walking distance. One solution would be following up with the wellness clinic. He says he can walk if it is close by, we  can give him the phone number and they can follow-up with him. Potentially this could help him get within the system and they can help fill his prescriptions to take outside the hospital setting.  Patient has been requesting to leave AMA since 11 AM. We were able to distract patient with food, drinks, and additional boluses of IV fluids until the care manager was able to secure an appointment. We went over the appointment time and location the patient several times prior to discharge. Alert, oriented, taking by PO normal vital signs at time of discharge.  Chea Malan Randall An, MD 09/28/15 1459

## 2015-09-28 NOTE — Discharge Planning (Signed)
Awaiting call from DSS case worker Erin HearingKeon Butcher [phone]

## 2015-09-28 NOTE — ED Notes (Signed)
Pt brought in via [facility] with complaints of bilateral lower extremity pain, and abdominal pain that started 24 hours ago. When EMS arrived they noticed pt was hypertensive with a blood pressure of 224/126. Pt is A/O. Pt denies HA, or vision changes. Pt has a history of hypertension but is noncompliant with medications.

## 2015-09-28 NOTE — ED Notes (Signed)
Pt refusing to have an IV placed or blood to be drawn.

## 2015-09-29 ENCOUNTER — Telehealth: Payer: Self-pay | Admitting: *Deleted

## 2015-09-29 NOTE — Telephone Encounter (Signed)
DSS case worker  ([facility]) returned call from yesterday regarding changing pt PCP and renewal card.  DSS states pt still has [place] address in the system; NCM notified them of pt [place] address as it was confirmed with pt and landlord yesterday.  DSS worker given contact name to reach pt and will work on changing information in system so pt can access healthcare in [place].

## 2015-10-07 ENCOUNTER — Observation Stay (HOSPITAL_COMMUNITY)
Admission: EM | Admit: 2015-10-07 | Discharge: 2015-10-10 | Discharge status: Against Medical Advice | Payer: Medicaid Other | Attending: Internal Medicine | Admitting: Internal Medicine

## 2015-10-07 ENCOUNTER — Encounter (HOSPITAL_COMMUNITY): Payer: Self-pay | Admitting: Emergency Medicine

## 2015-10-07 DIAGNOSIS — Z590 Homelessness: Secondary | ICD-10-CM | POA: Insufficient documentation

## 2015-10-07 DIAGNOSIS — D649 Anemia, unspecified: Secondary | ICD-10-CM | POA: Diagnosis not present

## 2015-10-07 DIAGNOSIS — N183 Chronic kidney disease, stage 3 (moderate): Secondary | ICD-10-CM | POA: Diagnosis not present

## 2015-10-07 DIAGNOSIS — Z9114 Patient's other noncompliance with medication regimen: Secondary | ICD-10-CM | POA: Insufficient documentation

## 2015-10-07 DIAGNOSIS — I160 Hypertensive urgency: Secondary | ICD-10-CM

## 2015-10-07 DIAGNOSIS — F1010 Alcohol abuse, uncomplicated: Secondary | ICD-10-CM | POA: Insufficient documentation

## 2015-10-07 DIAGNOSIS — Z9119 Patient's noncompliance with other medical treatment and regimen: Secondary | ICD-10-CM | POA: Diagnosis not present

## 2015-10-07 DIAGNOSIS — E1165 Type 2 diabetes mellitus with hyperglycemia: Secondary | ICD-10-CM | POA: Diagnosis not present

## 2015-10-07 DIAGNOSIS — F1910 Other psychoactive substance abuse, uncomplicated: Secondary | ICD-10-CM | POA: Diagnosis present

## 2015-10-07 DIAGNOSIS — I129 Hypertensive chronic kidney disease with stage 1 through stage 4 chronic kidney disease, or unspecified chronic kidney disease: Principal | ICD-10-CM | POA: Insufficient documentation

## 2015-10-07 DIAGNOSIS — F1410 Cocaine abuse, uncomplicated: Secondary | ICD-10-CM | POA: Diagnosis present

## 2015-10-07 DIAGNOSIS — K3184 Gastroparesis: Secondary | ICD-10-CM | POA: Diagnosis not present

## 2015-10-07 DIAGNOSIS — E1143 Type 2 diabetes mellitus with diabetic autonomic (poly)neuropathy: Secondary | ICD-10-CM | POA: Insufficient documentation

## 2015-10-07 DIAGNOSIS — F17210 Nicotine dependence, cigarettes, uncomplicated: Secondary | ICD-10-CM | POA: Diagnosis not present

## 2015-10-07 DIAGNOSIS — R296 Repeated falls: Secondary | ICD-10-CM | POA: Insufficient documentation

## 2015-10-07 DIAGNOSIS — R739 Hyperglycemia, unspecified: Secondary | ICD-10-CM

## 2015-10-07 LAB — TROPONIN I: TROPONIN I: 0.05 ng/mL — AB (ref <0.031)

## 2015-10-07 LAB — COMPREHENSIVE METABOLIC PANEL WITH GFR
ALT: 38 U/L (ref 17–63)
AST: 37 U/L (ref 15–41)
Albumin: 3 g/dL — ABNORMAL LOW (ref 3.5–5.0)
Alkaline Phosphatase: 150 U/L — ABNORMAL HIGH (ref 38–126)
Anion gap: 10 (ref 5–15)
BUN: 37 mg/dL — ABNORMAL HIGH (ref 6–20)
CO2: 26 mmol/L (ref 22–32)
Calcium: 9 mg/dL (ref 8.9–10.3)
Chloride: 89 mmol/L — ABNORMAL LOW (ref 101–111)
Creatinine, Ser: 2.04 mg/dL — ABNORMAL HIGH (ref 0.61–1.24)
GFR calc Af Amer: 39 mL/min — ABNORMAL LOW
GFR calc non Af Amer: 33 mL/min — ABNORMAL LOW
Glucose, Bld: 568 mg/dL (ref 65–99)
Potassium: 4.3 mmol/L (ref 3.5–5.1)
Sodium: 125 mmol/L — ABNORMAL LOW (ref 135–145)
Total Bilirubin: 0.7 mg/dL (ref 0.3–1.2)
Total Protein: 6.7 g/dL (ref 6.5–8.1)

## 2015-10-07 LAB — CBC
HCT: 31.4 % — ABNORMAL LOW (ref 39.0–52.0)
Hemoglobin: 10.6 g/dL — ABNORMAL LOW (ref 13.0–17.0)
MCH: 29.5 pg (ref 26.0–34.0)
MCHC: 33.8 g/dL (ref 30.0–36.0)
MCV: 87.5 fL (ref 78.0–100.0)
Platelets: 187 K/uL (ref 150–400)
RBC: 3.59 MIL/uL — ABNORMAL LOW (ref 4.22–5.81)
RDW: 13.1 % (ref 11.5–15.5)
WBC: 4.6 K/uL (ref 4.0–10.5)

## 2015-10-07 LAB — CBG MONITORING, ED: GLUCOSE-CAPILLARY: 505 mg/dL — AB (ref 65–99)

## 2015-10-07 MED ORDER — INSULIN ASPART 100 UNIT/ML ~~LOC~~ SOLN
10.0000 [IU] | Freq: Once | SUBCUTANEOUS | Status: AC
  Administered 2015-10-07: 10 [IU] via SUBCUTANEOUS
  Filled 2015-10-07: qty 1

## 2015-10-07 MED ORDER — SODIUM CHLORIDE 0.9 % IV BOLUS (SEPSIS)
1000.0000 mL | Freq: Once | INTRAVENOUS | Status: AC
  Administered 2015-10-07: 1000 mL via INTRAVENOUS

## 2015-10-07 MED ORDER — ONDANSETRON HCL 4 MG/2ML IJ SOLN
4.0000 mg | Freq: Once | INTRAMUSCULAR | Status: AC | PRN
  Administered 2015-10-07: 4 mg via INTRAVENOUS
  Filled 2015-10-07 (×2): qty 2

## 2015-10-07 MED ORDER — SODIUM CHLORIDE 0.9 % IV BOLUS (SEPSIS)
1000.0000 mL | Freq: Once | INTRAVENOUS | Status: AC
  Administered 2015-10-08: 1000 mL via INTRAVENOUS

## 2015-10-07 NOTE — ED Provider Notes (Signed)
CSN: [id number]     Arrival date & time 10/07/15  2212 History  By signing my name below, I, Phillis HaggisGabriella Gaje, attest that this documentation has been prepared under the direction and in the presence of Shon Batonourtney F Horton, MD. Electronically Signed: Phillis HaggisGabriella Gaje, ED Scribe. 10/07/2015. 11:15 PM.   Chief Complaint  Patient presents with  . Hyperglycemia  . Nausea   The history is provided by the patient. No language interpreter was used.  HPI Comments: Clayton [name] is a 64 y.o. Male with a hx of HTN, DM, homelessness, non-compliance, and alcohol abuse brought in by EMS who presents to the Emergency Department complaining of increased CBG onset PTA. He states that he felt a "tingling" in his hand and felt something was wrong. He has not been measuring his CBG at home and he states that he is out of medications. He states that he does not take insulin because he does not know how to use it. Reports chronic shortness of breath. Denies chest pain. He denies fever, cough, SOB, new leg swelling, abdominal pain, vomiting or diarrhea. He states that he drinks a can of beer every couple of days.   He has had multiple recent ER visits. History of medical noncompliance. He has also had recent admission and follow-up has been scheduled. Social work notes are in the chart.   Past Medical History  Diagnosis Date  . Hypertension   . Diabetes mellitus   . Homelessness   . Periodontal disease   . Noncompliance with medications   . PTSD (post-traumatic stress disorder)   . Alcohol abuse, unspecified    History reviewed. No pertinent past surgical history. Family History  Problem Relation Age of Onset  . Diabetes Mellitus II Mother   . Diabetes Mellitus II Father   . Stroke Maternal Grandfather    Social History  Substance Use Topics  . Smoking status: Current Every Day Smoker -- 0.50 packs/day    Types: Cigarettes  . Smokeless tobacco: None  . Alcohol Use: Yes    Review of Systems   Constitutional: Negative for fever and chills.  Respiratory: Positive for shortness of breath. Negative for cough.   Cardiovascular: Negative for leg swelling.  Gastrointestinal: Negative for abdominal pain.  Skin: Negative for rash.  All other systems reviewed and are negative.     Allergies  Review of patient's allergies indicates no known allergies.  Home Medications   Prior to Admission medications   Not on File   BP 198/94 mmHg  Pulse 95  Temp(Src) 98.4 F (36.9 C) (Oral)  Resp 18  Ht 6\' 1"  (1.854 m)  Wt 180 lb (81.647 kg)  BMI 23.75 kg/m2  SpO2 98% Physical Exam  Constitutional: He is oriented to person, place, and time. No distress.  Unkempt, disheveled-appearing  HENT:  Head: Normocephalic and atraumatic.  Mucous membranes dry  Eyes: Pupils are equal, round, and reactive to light.  Cardiovascular: Normal rate, regular rhythm and normal heart sounds.   No murmur heard. Pulmonary/Chest: Effort normal and breath sounds normal. No respiratory distress. He has no wheezes. He exhibits no tenderness.  Abdominal: Soft. Bowel sounds are normal. There is no tenderness. There is no rebound.  Musculoskeletal: He exhibits no edema.  Neurological: He is alert and oriented to person, place, and time.  Skin: Skin is warm and dry.  Psychiatric: He has a normal mood and affect.  Nursing note and vitals reviewed.   ED Course  Procedures (including critical care time) DIAGNOSTIC  STUDIES: Oxygen Saturation is 99% on RA, normal by my interpretation.    COORDINATION OF CARE: 11:13 PM-Discussed treatment plan which includes fluids and insulin with pt at bedside and pt agreed to plan.    Labs Review Labs Reviewed  COMPREHENSIVE METABOLIC PANEL - Abnormal; Notable for the following:    Sodium 125 (*)    Chloride 89 (*)    Glucose, Bld 568 (*)    BUN 37 (*)    Creatinine, Ser 2.04 (*)    Albumin 3.0 (*)    Alkaline Phosphatase 150 (*)    GFR calc non Af Amer 33 (*)     GFR calc Af Amer 39 (*)    All other components within normal limits  CBC - Abnormal; Notable for the following:    RBC 3.59 (*)    Hemoglobin 10.6 (*)    HCT 31.4 (*)    All other components within normal limits  URINALYSIS, ROUTINE W REFLEX MICROSCOPIC (NOT AT [facility]) - Abnormal; Notable for the following:    Color, Urine STRAW (*)    Glucose, UA >1000 (*)    Hgb urine dipstick MODERATE (*)    Protein, ur >300 (*)    All other components within normal limits  TROPONIN I - Abnormal; Notable for the following:    Troponin I 0.05 (*)    All other components within normal limits  BASIC METABOLIC PANEL - Abnormal; Notable for the following:    Sodium 134 (*)    Chloride 99 (*)    Glucose, Bld 284 (*)    BUN 31 (*)    Creatinine, Ser 1.73 (*)    Calcium 8.7 (*)    GFR calc non Af Amer 41 (*)    GFR calc Af Amer 47 (*)    All other components within normal limits  TROPONIN I - Abnormal; Notable for the following:    Troponin I 0.04 (*)    All other components within normal limits  CBG MONITORING, ED - Abnormal; Notable for the following:    Glucose-Capillary 505 (*)    All other components within normal limits  CBG MONITORING, ED - Abnormal; Notable for the following:    Glucose-Capillary 318 (*)    All other components within normal limits  CBG MONITORING, ED - Abnormal; Notable for the following:    Glucose-Capillary 246 (*)    All other components within normal limits  ETHANOL  URINE MICROSCOPIC-ADD ON    Imaging Review Dg Chest 2 View  10/08/2015  CLINICAL DATA:  Hyperglycemia.  Hypertension.  Shortness of Breath EXAM: CHEST  2 VIEW COMPARISON:  August 16, 2015 FINDINGS: There is no edema or consolidation. Heart is upper normal in size with pulmonary vascularity within normal limits. No adenopathy. No bone lesions. IMPRESSION: No edema or consolidation. Electronically Signed   By: Bretta Bang III M.D.   On: 10/08/2015 00:12   I have personally reviewed and  evaluated these images and lab results as part of my medical decision-making.   EKG Interpretation   Date/Time:  Saturday October 07 2015 23:45:23 EDT Ventricular Rate:  90 PR Interval:  138 QRS Duration: 147 QT Interval:  398 QTC Calculation: 487 R Axis:   83 Text Interpretation:  Sinus rhythm Right bundle branch block Baseline  wander in lead(s) V2 No significant change since last tracing Confirmed by  HORTON  MD, Toni Amend ([id number]) on 10/07/2015 11:48:38 PM      MDM   Final diagnoses:  H/O medication  noncompliance  Hyperglycemia  Hypertensive urgency    Patient presents with concerns for high blood sugar. Reports tingling in his hands and shortness of breath. EKG has no significant changes from baseline. He was noted to be hypertensive. He is hypertensive upon every evaluation in the ER. He is otherwise nontoxic. Chest x-ray is negative. No lower extremity swelling concerning for a heart failure.  Patient was given 2 L of fluid and insulin. No evidence of anion gap. He was notably hypo-natriuretic which is likely related to his hyperglycemia.  Initial troponin minimally elevated at 0.05. Have reviewed the patient's chart.  He had a similar troponin elevation and had a formal rule out during hospitalization with a similar presentation. He is without chest pain. Will repeat his troponin. Repeat troponin and BNP with downtrending troponin and improved BNP with improved creatinine and sodium. Patient continues to be hypertensive. He is medically noncompliant, does not have his medications, and has not followed up as an outpatient yet. Given persistent hypertension and minimal elevation in troponin, will admit for hypertensive urgency and hyperglycemia. At this time I do not feel he needs formal cardiac evaluation as he is not having chest pain and his troponin is stable and downtrending.  I personally performed the services described in this documentation, which was scribed in my presence.  The recorded information has been reviewed and is accurate.   Shon Baton, MD 10/08/15 [phone]

## 2015-10-07 NOTE — ED Notes (Signed)
Iv team at the bedside 

## 2015-10-07 NOTE — ED Notes (Signed)
Called main lab, troponin to be added.

## 2015-10-07 NOTE — ED Notes (Signed)
Reported bp to Dr. Wilkie AyeHorton, Md acknowledges, no new orders.

## 2015-10-07 NOTE — ED Notes (Signed)
Missed iv access attempt by this rn, and bob, rn.

## 2015-10-07 NOTE — ED Notes (Signed)
Per EMS, [facility]. He reports he's been out of his medications for weeks now, because "the pharmacy is too far". Ems reported cbg of 520. Vs 162/74, p 88, o2 sat 99% RA. Hx of htn, and also noncomplaint with those medications.

## 2015-10-07 NOTE — ED Notes (Signed)
cbg on arrival is [id number]

## 2015-10-08 ENCOUNTER — Encounter (HOSPITAL_COMMUNITY): Payer: Self-pay | Admitting: Radiology

## 2015-10-08 ENCOUNTER — Emergency Department (HOSPITAL_COMMUNITY): Payer: Medicaid Other

## 2015-10-08 DIAGNOSIS — E118 Type 2 diabetes mellitus with unspecified complications: Secondary | ICD-10-CM

## 2015-10-08 DIAGNOSIS — Z9119 Patient's noncompliance with other medical treatment and regimen: Secondary | ICD-10-CM | POA: Diagnosis not present

## 2015-10-08 DIAGNOSIS — E1165 Type 2 diabetes mellitus with hyperglycemia: Secondary | ICD-10-CM

## 2015-10-08 DIAGNOSIS — I160 Hypertensive urgency: Secondary | ICD-10-CM

## 2015-10-08 DIAGNOSIS — R7989 Other specified abnormal findings of blood chemistry: Secondary | ICD-10-CM | POA: Diagnosis not present

## 2015-10-08 DIAGNOSIS — F1410 Cocaine abuse, uncomplicated: Secondary | ICD-10-CM | POA: Diagnosis present

## 2015-10-08 LAB — GLUCOSE, CAPILLARY
GLUCOSE-CAPILLARY: 484 mg/dL — AB (ref 65–99)
GLUCOSE-CAPILLARY: 315 mg/dL — AB (ref 65–99)
Glucose-Capillary: 482 mg/dL — ABNORMAL HIGH (ref 65–99)

## 2015-10-08 LAB — BASIC METABOLIC PANEL
Anion gap: 11 (ref 5–15)
BUN: 31 mg/dL — AB (ref 6–20)
CHLORIDE: 99 mmol/L — AB (ref 101–111)
CO2: 24 mmol/L (ref 22–32)
Calcium: 8.7 mg/dL — ABNORMAL LOW (ref 8.9–10.3)
Creatinine, Ser: 1.73 mg/dL — ABNORMAL HIGH (ref 0.61–1.24)
GFR calc Af Amer: 47 mL/min — ABNORMAL LOW (ref >60)
GFR calc non Af Amer: 41 mL/min — ABNORMAL LOW (ref >60)
GLUCOSE: 284 mg/dL — AB (ref 65–99)
POTASSIUM: 3.8 mmol/L (ref 3.5–5.1)
Sodium: 134 mmol/L — ABNORMAL LOW (ref 135–145)

## 2015-10-08 LAB — URINALYSIS, ROUTINE W REFLEX MICROSCOPIC
BILIRUBIN URINE: NEGATIVE
Glucose, UA: >1000 mg/dL — AB
KETONES UR: NEGATIVE mg/dL
LEUKOCYTES UA: NEGATIVE
Nitrite: NEGATIVE
Protein, ur: >300 mg/dL — AB
SPECIFIC GRAVITY, URINE: 1.024 (ref 1.005–1.030)
UROBILINOGEN UA: 0.2 mg/dL (ref 0.0–1.0)
pH: 6 (ref 5.0–8.0)

## 2015-10-08 LAB — RAPID URINE DRUG SCREEN, HOSP PERFORMED
Amphetamines: NOT DETECTED
Barbiturates: NOT DETECTED
Benzodiazepines: NOT DETECTED
COCAINE: POSITIVE — AB
OPIATES: NOT DETECTED
TETRAHYDROCANNABINOL: NOT DETECTED

## 2015-10-08 LAB — URINE MICROSCOPIC-ADD ON

## 2015-10-08 LAB — TROPONIN I: Troponin I: 0.04 ng/mL — ABNORMAL HIGH (ref <0.031)

## 2015-10-08 LAB — CBG MONITORING, ED
Glucose-Capillary: 318 mg/dL — ABNORMAL HIGH (ref 65–99)
Glucose-Capillary: 246 mg/dL — ABNORMAL HIGH (ref 65–99)

## 2015-10-08 LAB — ETHANOL: Alcohol, Ethyl (B): <5 mg/dL (ref <5)

## 2015-10-08 MED ORDER — PIOGLITAZONE HCL 15 MG PO TABS: 15.0000 mg | ORAL_TABLET | Freq: Every day | ORAL | Status: DC

## 2015-10-08 MED ORDER — ACETAMINOPHEN 650 MG RE SUPP: 650.0000 mg | Freq: Four times a day (QID) | RECTAL | Status: DC | PRN

## 2015-10-08 MED ORDER — DILTIAZEM HCL 60 MG PO TABS
120.0000 mg | ORAL_TABLET | Freq: Two times a day (BID) | ORAL | Status: DC
  Administered 2015-10-08: 120 mg via ORAL
  Filled 2015-10-08 (×3): qty 2

## 2015-10-08 MED ORDER — LORAZEPAM 1 MG PO TABS: 0.0000 mg | ORAL_TABLET | Freq: Two times a day (BID) | ORAL | Status: DC

## 2015-10-08 MED ORDER — ACETAMINOPHEN 325 MG PO TABS: 650.0000 mg | ORAL_TABLET | Freq: Four times a day (QID) | ORAL | Status: DC | PRN

## 2015-10-08 MED ORDER — VITAMIN B-1 100 MG PO TABS
100.0000 mg | ORAL_TABLET | Freq: Every day | ORAL | Status: DC
  Administered 2015-10-08 – 2015-10-09 (×2): 100 mg via ORAL
  Filled 2015-10-08 (×2): qty 1

## 2015-10-08 MED ORDER — FOLIC ACID 1 MG PO TABS: 1.0000 mg | ORAL_TABLET | Freq: Every day | ORAL | Status: DC

## 2015-10-08 MED ORDER — THIAMINE HCL 100 MG/ML IJ SOLN: 100.0000 mg | Freq: Every day | INTRAMUSCULAR | Status: DC

## 2015-10-08 MED ORDER — DILTIAZEM HCL ER COATED BEADS 240 MG PO CP24: 240.0000 mg | ORAL_CAPSULE | Freq: Every day | ORAL | Status: DC

## 2015-10-08 MED ORDER — LORAZEPAM 1 MG PO TABS: 1.0000 mg | ORAL_TABLET | Freq: Four times a day (QID) | ORAL | Status: DC | PRN

## 2015-10-08 MED ORDER — INSULIN ASPART 100 UNIT/ML ~~LOC~~ SOLN: 0.0000 [IU] | Freq: Three times a day (TID) | SUBCUTANEOUS | Status: DC

## 2015-10-08 MED ORDER — ADULT MULTIVITAMIN W/MINERALS CH
1.0000 | ORAL_TABLET | Freq: Every day | ORAL | Status: DC
  Administered 2015-10-08 – 2015-10-09 (×2): 1 via ORAL
  Filled 2015-10-08 (×2): qty 1

## 2015-10-08 MED ORDER — INSULIN ASPART 100 UNIT/ML ~~LOC~~ SOLN: 0.0000 [IU] | Freq: Every day | SUBCUTANEOUS | Status: DC

## 2015-10-08 MED ORDER — INSULIN ASPART 100 UNIT/ML ~~LOC~~ SOLN
0.0000 [IU] | Freq: Three times a day (TID) | SUBCUTANEOUS | Status: DC
  Administered 2015-10-09: 15 [IU] via SUBCUTANEOUS
  Administered 2015-10-09: 11 [IU] via SUBCUTANEOUS

## 2015-10-08 MED ORDER — HYDRALAZINE HCL 20 MG/ML IJ SOLN
5.0000 mg | Freq: Once | INTRAMUSCULAR | Status: AC
  Administered 2015-10-08: 5 mg via INTRAVENOUS
  Filled 2015-10-08: qty 1

## 2015-10-08 MED ORDER — LORAZEPAM 1 MG PO TABS
0.0000 mg | ORAL_TABLET | Freq: Four times a day (QID) | ORAL | Status: AC
  Administered 2015-10-08: 4 mg via ORAL
  Filled 2015-10-08: qty 4
  Filled 2015-10-08: qty 1

## 2015-10-08 MED ORDER — FOLIC ACID 5 MG/ML IJ SOLN
1.0000 mg | Freq: Every day | INTRAMUSCULAR | Status: DC
  Filled 2015-10-08 (×2): qty 0.2

## 2015-10-08 MED ORDER — LORAZEPAM 2 MG/ML IJ SOLN: 1.0000 mg | Freq: Four times a day (QID) | INTRAMUSCULAR | Status: DC | PRN

## 2015-10-08 MED ORDER — ENOXAPARIN SODIUM 40 MG/0.4ML ~~LOC~~ SOLN
40.0000 mg | SUBCUTANEOUS | Status: DC
  Administered 2015-10-08 – 2015-10-09 (×2): 40 mg via SUBCUTANEOUS
  Filled 2015-10-08 (×2): qty 0.4

## 2015-10-08 MED ORDER — INSULIN ASPART 100 UNIT/ML ~~LOC~~ SOLN
10.0000 [IU] | Freq: Once | SUBCUTANEOUS | Status: AC
  Administered 2015-10-08: 10 [IU] via SUBCUTANEOUS

## 2015-10-08 MED ORDER — SODIUM CHLORIDE 0.9 % IJ SOLN
3.0000 mL | Freq: Two times a day (BID) | INTRAMUSCULAR | Status: DC
  Administered 2015-10-08 – 2015-10-09 (×2): 3 mL via INTRAVENOUS

## 2015-10-08 MED ORDER — ALUM & MAG HYDROXIDE-SIMETH 200-200-20 MG/5ML PO SUSP: 30.0000 mL | Freq: Four times a day (QID) | ORAL | Status: DC | PRN

## 2015-10-08 MED ORDER — INSULIN ASPART 100 UNIT/ML ~~LOC~~ SOLN
25.0000 [IU] | Freq: Once | SUBCUTANEOUS | Status: AC
  Administered 2015-10-08: 25 [IU] via SUBCUTANEOUS

## 2015-10-08 MED ORDER — INSULIN ASPART 100 UNIT/ML ~~LOC~~ SOLN
0.0000 [IU] | Freq: Every day | SUBCUTANEOUS | Status: DC
  Administered 2015-10-08: 4 [IU] via SUBCUTANEOUS

## 2015-10-08 MED ORDER — GLYBURIDE 2.5 MG PO TABS: 2.5000 mg | ORAL_TABLET | Freq: Every day | ORAL | Status: DC

## 2015-10-08 MED ORDER — GLYBURIDE 5 MG PO TABS
5.0000 mg | ORAL_TABLET | Freq: Every day | ORAL | Status: DC
  Administered 2015-10-08 – 2015-10-09 (×2): 5 mg via ORAL
  Filled 2015-10-08 (×4): qty 1

## 2015-10-08 MED ORDER — SODIUM CHLORIDE 0.9 % IV SOLN
INTRAVENOUS | Status: DC
  Administered 2015-10-08: 17:00:00 via INTRAVENOUS

## 2015-10-08 NOTE — Progress Notes (Signed)
Pt refused labs to be drawn. Barbera Settersurner, Mason Dibiasio B RN

## 2015-10-08 NOTE — ED Notes (Signed)
Ordered pt a diabetic tray

## 2015-10-08 NOTE — Discharge Instructions (Signed)
Hypertension °Hypertension, commonly called high blood pressure, is when the force of blood pumping through your arteries is too strong. Your arteries are the blood vessels that carry blood from your heart throughout your body. A blood pressure reading consists of a higher number over a lower number, such as 110/72. The higher number (systolic) is the pressure inside your arteries when your heart pumps. The lower number (diastolic) is the pressure inside your arteries when your heart relaxes. Ideally you want your blood pressure below 120/80. °Hypertension forces your heart to work harder to pump blood. Your arteries may become narrow or stiff. Having untreated or uncontrolled hypertension can cause heart attack, stroke, kidney disease, and other problems. °RISK FACTORS °Some risk factors for high blood pressure are controllable. Others are not.  °Risk factors you cannot control include:  °· Race. You may be at higher risk if you are African American. °· Age. Risk increases with age. °· Gender. Men are at higher risk than women before age 45 years. After age 65, women are at higher risk than men. °Risk factors you can control include: °· Not getting enough exercise or physical activity. °· Being overweight. °· Getting too much fat, sugar, calories, or salt in your diet. °· Drinking too much alcohol. °SIGNS AND SYMPTOMS °Hypertension does not usually cause signs or symptoms. Extremely high blood pressure (hypertensive crisis) may cause headache, anxiety, shortness of breath, and nosebleed. °DIAGNOSIS °To check if you have hypertension, your health care provider will measure your blood pressure while you are seated, with your arm held at the level of your heart. It should be measured at least twice using the same arm. Certain conditions can cause a difference in blood pressure between your right and left arms. A blood pressure reading that is higher than normal on one occasion does not mean that you need treatment. If  it is not clear whether you have high blood pressure, you may be asked to return on a different day to have your blood pressure checked again. Or, you may be asked to monitor your blood pressure at home for 1 or more weeks. °TREATMENT °Treating high blood pressure includes making lifestyle changes and possibly taking medicine. Living a healthy lifestyle can help lower high blood pressure. You may need to change some of your habits. °Lifestyle changes may include: °· Following the DASH diet. This diet is high in fruits, vegetables, and whole grains. It is low in salt, red meat, and added sugars. °· Keep your sodium intake below 2,300 mg per day. °· Getting at least 30-45 minutes of aerobic exercise at least 4 times per week. °· Losing weight if necessary. °· Not smoking. °· Limiting alcoholic beverages. °· Learning ways to reduce stress. °Your health care provider may prescribe medicine if lifestyle changes are not enough to get your blood pressure under control, and if one of the following is true: °· You are 18-59 years of age and your systolic blood pressure is above 140. °· You are 60 years of age or older, and your systolic blood pressure is above 150. °· Your diastolic blood pressure is above 90. °· You have diabetes, and your systolic blood pressure is over 140 or your diastolic blood pressure is over 90. °· You have kidney disease and your blood pressure is above 140/90. °· You have heart disease and your blood pressure is above 140/90. °Your personal target blood pressure may vary depending on your medical conditions, your age, and other factors. °HOME CARE INSTRUCTIONS °·   Have your blood pressure rechecked as directed by your health care provider.   °· Take medicines only as directed by your health care provider. Follow the directions carefully. Blood pressure medicines must be taken as prescribed. The medicine does not work as well when you skip doses. Skipping doses also puts you at risk for  problems. °· Do not smoke.   °· Monitor your blood pressure at home as directed by your health care provider.  °SEEK MEDICAL CARE IF:  °· You think you are having a reaction to medicines taken. °· You have recurrent headaches or feel dizzy. °· You have swelling in your ankles. °· You have trouble with your vision. °SEEK IMMEDIATE MEDICAL CARE IF: °· You develop a severe headache or confusion. °· You have unusual weakness, numbness, or feel faint. °· You have severe chest or abdominal pain. °· You vomit repeatedly. °· You have trouble breathing. °MAKE SURE YOU:  °· Understand these instructions. °· Will watch your condition. °· Will get help right away if you are not doing well or get worse. °  °This information is not intended to replace advice given to you by your health care provider. Make sure you discuss any questions you have with your health care provider. °  °Document Released: 11/25/2005 Document Revised: 04/11/2015 Document Reviewed: 09/17/2013 °Elsevier Interactive Patient Education ©2016 [employer] °Hyperglycemia °Hyperglycemia occurs when the glucose (sugar) in your blood is too high. Hyperglycemia can happen for many reasons, but it most often happens to people who do not know they have diabetes or are not managing their diabetes properly.  °CAUSES  °Whether you have diabetes or not, there are other causes of hyperglycemia. Hyperglycemia can occur when you have diabetes, but it can also occur in other situations that you might not be as aware of, such as: °Diabetes °· If you have diabetes and are having problems controlling your blood glucose, hyperglycemia could occur because of some of the following reasons: °· Not following your meal plan. °· Not taking your diabetes medications or not taking it properly. °· Exercising less or doing less activity than you normally do. °· Being sick. °Pre-diabetes °· This cannot be ignored. Before people develop Type 2 diabetes, they almost always have  "pre-diabetes." This is when your blood glucose levels are higher than normal, but not yet high enough to be diagnosed as diabetes. Research has shown that some long-term damage to the body, especially the heart and circulatory system, may already be occurring during pre-diabetes. If you take action to manage your blood glucose when you have pre-diabetes, you may delay or prevent Type 2 diabetes from developing. °Stress °· If you have diabetes, you may be "diet" controlled or on oral medications or insulin to control your diabetes. However, you may find that your blood glucose is higher than usual in the hospital whether you have diabetes or not. This is often referred to as "stress hyperglycemia." Stress can elevate your blood glucose. This happens because of hormones put out by the body during times of stress. If stress has been the cause of your high blood glucose, it can be followed regularly by your caregiver. That way he/she can make sure your hyperglycemia does not continue to get worse or progress to diabetes. °Steroids °· Steroids are medications that act on the infection fighting system (immune system) to block inflammation or infection. One side effect can be a rise in blood glucose. Most people can produce enough extra insulin to allow for this rise, but for those   who cannot, steroids make blood glucose levels go even higher. It is not unusual for steroid treatments to "uncover" diabetes that is developing. It is not always possible to determine if the hyperglycemia will go away after the steroids are stopped. A special blood test called an A1c is sometimes done to determine if your blood glucose was elevated before the steroids were started. °SYMPTOMS °· Thirsty. °· Frequent urination. °· Dry mouth. °· Blurred vision. °· Tired or fatigue. °· Weakness. °· Sleepy. °· Tingling in feet or leg. °DIAGNOSIS  °Diagnosis is made by monitoring blood glucose in one or all of the following ways: °· A1c test. This  is a chemical found in your blood. °· Fingerstick blood glucose monitoring. °· Laboratory results. °TREATMENT  °First, knowing the cause of the hyperglycemia is important before the hyperglycemia can be treated. Treatment may include, but is not be limited to: °· Education. °· Change or adjustment in medications. °· Change or adjustment in meal plan. °· Treatment for an illness, infection, etc. °· More frequent blood glucose monitoring. °· Change in exercise plan. °· Decreasing or stopping steroids. °· Lifestyle changes. °HOME CARE INSTRUCTIONS  °· Test your blood glucose as directed. °· Exercise regularly. Your caregiver will give you instructions about exercise. Pre-diabetes or diabetes which comes on with stress is helped by exercising. °· Eat wholesome, balanced meals. Eat often and at regular, fixed times. Your caregiver or nutritionist will give you a meal plan to guide your sugar intake. °· Being at an ideal weight is important. If needed, losing as little as 10 to 15 pounds may help improve blood glucose levels. °SEEK MEDICAL CARE IF:  °· You have questions about medicine, activity, or diet. °· You continue to have symptoms (problems such as increased thirst, urination, or weight gain). °SEEK IMMEDIATE MEDICAL CARE IF:  °· You are vomiting or have diarrhea. °· Your breath smells fruity. °· You are breathing faster or slower. °· You are very sleepy or incoherent. °· You have numbness, tingling, or pain in your feet or hands. °· You have chest pain. °· Your symptoms get worse even though you have been following your caregiver's orders. °· If you have any other questions or concerns. °  °This information is not intended to replace advice given to you by your health care provider. Make sure you discuss any questions you have with your health care provider. °  °Document Released: 05/21/2001 Document Revised: 02/17/2012 Document Reviewed: 08/01/2015 °Elsevier Interactive Patient Education ©2016 [employer] ° °

## 2015-10-08 NOTE — Progress Notes (Addendum)
UDS positive for cocaine so avoid use of beta blockers  1700: CBG at 4pm still elevated @ 482. Start NS IV at 150cc/hr and give 1x dose IV Insulin 10 units and follow  Junious SilkAllison Ellis ANP

## 2015-10-08 NOTE — ED Notes (Signed)
CBG 318 

## 2015-10-08 NOTE — ED Notes (Signed)
Attempted report 

## 2015-10-08 NOTE — H&P (Signed)
[facility] History and Physical                                                                                    Clayton [name], is a 64 y.o. male  MRN: [id number]   DOB - 04-18-1951  Admit Date - 10/07/2015  Outpatient Primary MD for the patient is No PCP Per Patient  Referring MD: [facility] / ER  With History of -  Past Medical History  Diagnosis Date  . Hypertension   . Diabetes mellitus   . Homelessness   . Periodontal disease   . Noncompliance with medications   . PTSD (post-traumatic stress disorder)   . Alcohol abuse, unspecified       History reviewed. No pertinent past surgical history.  in for   Chief Complaint  Patient presents with  . Hyperglycemia  . Nausea     HPI This is a 64 year old male patient known chronic medication and therapies noncompliance as well as homelessness. Documented history of PTSD and alcohol abuse as well as diabetes and hypertension. Patient presented to the ER with reports of nausea and tingling in his hands and feeling like something was wrong. He initially reported to the ER physician that he had not been following his CBGs and had not been taking medication stating he was out of them. After further questioning it was determined he had actually not filled any of his prescriptions since the last admission. He also reported to the EDP that he did not take his insulin because he does not use it. He reports chronic shortness of breath was not having any chest pain or fever cough source of breath or increased swelling abdominal pain vomiting or diarrhea. States that he drinks a can of beer every couple of days. When I questioned him regarding his alcohol use, he stated he doesn't drink that much and but would not quantify how much he drank. He denies previous alcohol withdrawal episodes. Of note patient has multiple recent ER visit secondary to medical noncompliance. During each ER visit he is thoroughly evaluated by case management and  social worker with the plan set in place which the patient does not adhere to. He was admitted earlier in the month with abdominal pain and gastroparesis secondary to uncontrolled diabetes but on 10/10 left AGAINST MEDICAL ADVICE.  In the ER patient was afebrile, blood pressure was elevated at 191/102, pulse was 91 and regular, room air saturations were 100%. Chest x-ray unremarkable and no evidence of volume overload. Patient had moderate to severe hyperglycemia with serum glucose 568 with an associated sodium of 125. BUN was 37 and creatinine 2.4 which is slightly higher than patient's baseline of around 1.5-1.7. Patient was given 2 L of IV fluid in the ER with subsequent improvement in his renal function BUN now 31 and creatinine 1.73 with repeat labs. With hydration his glucose improved to 284 and sodium increased to 134. Anion gap remained normal. Troponin mildly elevated at 0.05 with no EKG changes and no reports of chest pain. Repeat troponin was 0.04. For his hypertension he was given a dose of hydralazine 10 mg 1 last recent blood pressure remained elevated  at 198/94. Because of poorly controlled blood pressure patient is being admitted for hypertensive urgency.   Review of Systems   In addition to the HPI above,  No Fever-chills, myalgias or other constitutional symptoms No Headache, changes with Vision or hearing, new weakness, tingling, numbness in any extremity, No problems swallowing food or Liquids No Chest pain, palpitations, orthopnea or DOE No Abdominal pain, N/V; no melena or hematochezia, no dark tarry stools No dysuria, hematuria or flank pain No new skin rashes, lesions, masses or bruises, No new joints pains-aches No recent weight gain or loss No polyuria, polydypsia or polyphagia,  *A full 10 point Review of Systems was done, except as stated above, all other Review of Systems were negative.  Social History Social History  Substance Use Topics  . Smoking status:  Current Every Day Smoker -- 0.50 packs/day    Types: Cigarettes  . Smokeless tobacco: Not on file  . Alcohol Use: Yes    Resides at: Reported to EDP was homeless but reported to me he has an apartment  Lives with: Alone  Ambulatory status: Out assistive devices   Family History Family History  Problem Relation Age of Onset  . Diabetes Mellitus II Mother   . Diabetes Mellitus II Father   . Stroke Maternal Grandfather      Prior to Admission medications   Not on File    No Known Allergies  Physical Exam  Vitals  Blood pressure 198/94, pulse 95, temperature 98.4 F (36.9 C), temperature source Oral, resp. rate 18, height '6\' 1"'  (1.854 m), weight 180 lb (81.647 kg), SpO2 98 %.   General:  In no acute distress, appears unkempt and overall malodorous  Psych: Flat affect, Denies Suicidal or Homicidal ideations, Awake, Oriented X 3. Speech is clear and appropriate, no apparent short term memory deficits  Neuro:   No focal neurological deficits, CN II through XII intact, Strength 5/5 all 4 extremities, Sensation intact all 4 extremities.  ENT:  Ears and Eyes appear Normal, Conjunctivae clear, PER. Moist oral mucosa without erythema or exudates.  Neck:  Supple, No lymphadenopathy appreciated  Respiratory:  Symmetrical chest wall movement, Good air movement bilaterally, CTAB. Room Air  Cardiac:  RRR, No Murmurs, no LE edema noted, no JVD, No carotid bruits, peripheral pulses palpable at 2+  Abdomen:  Positive bowel sounds, Soft, Non tender, Non distended,  No masses appreciated, no obvious hepatosplenomegaly  Skin:  No Cyanosis, Normal Skin Turgor, No Skin Rash or Bruise. Multiple papular lesions on lower extremities in various stages of healing somewhat eschars, debris accumulation consistent with poor hygiene noted in anterior chest region  Extremities: Symmetrical without obvious trauma or injury,  no effusions.  Data Review  CBC  Recent Labs Lab 10/07/15 2257    WBC 4.6  HGB 10.6*  HCT 31.4*  PLT 187  MCV 87.5  MCH 29.5  MCHC 33.8  RDW 13.1    Chemistries   Recent Labs Lab 10/07/15 2257 10/08/15 0530  NA 125* 134*  K 4.3 3.8  CL 89* 99*  CO2 26 24  GLUCOSE 568* 284*  BUN 37* 31*  CREATININE 2.04* 1.73*  CALCIUM 9.0 8.7*  AST 37  --   ALT 38  --   ALKPHOS 150*  --   BILITOT 0.7  --     estimated creatinine clearance is 50 mL/min (by C-G formula based on Cr of 1.73).  No results for input(s): TSH, T4TOTAL, T3FREE, THYROIDAB in the last 72 hours.  Invalid input(s): FREET3  Coagulation profile No results for input(s): INR, PROTIME in the last 168 hours.  No results for input(s): DDIMER in the last 72 hours.  Cardiac Enzymes  Recent Labs Lab 10/07/15 2310 10/08/15 0530  TROPONINI 0.05* 0.04*    Invalid input(s): POCBNP  Urinalysis    Component Value Date/Time   COLORURINE STRAW* 10/08/2015 [place] 10/08/2015 0634   LABSPEC 1.024 10/08/2015 0634   PHURINE 6.0 10/08/2015 0634   GLUCOSEU >1000* 10/08/2015 0634   HGBUR MODERATE* 10/08/2015 [place] 10/08/2015 [place] 10/08/2015 0634   PROTEINUR >300* 10/08/2015 0634   UROBILINOGEN 0.2 10/08/2015 0634   NITRITE NEGATIVE 10/08/2015 0634   LEUKOCYTESUR NEGATIVE 10/08/2015 0634    Imaging results:   Ct Abdomen Pelvis Wo Contrast  09/17/2015  CLINICAL DATA:  Lower abdominal pain, nausea and vomiting. Symptoms are subacute. Generalized abdominal pain. Initial encounter. EXAM: CT ABDOMEN AND PELVIS WITHOUT CONTRAST TECHNIQUE: Multidetector CT imaging of the abdomen and pelvis was performed following the standard protocol without IV contrast. COMPARISON:  Ultrasound same day.  CT report 01/13/2012 FINDINGS: Lung bases are clear. No pleural or pericardial fluid. No liver lesions seen without contrast. Insignificant calcified granuloma in the right lobe. No calcified gallstones. The spleen is normal. The pancreas is  normal. The adrenal glands are normal. The kidneys are normal. No cyst, mass, stone or hydronephrosis. I do not see a worrisome perinephric edema pattern by CT. The aorta shows mild atherosclerosis but there is no aneurysm. The IVC is normal. No retroperitoneal mass or adenopathy. No free fluid. No free air. Bladder, prostate gland and seminal vesicles are unremarkable. No visible bowel pathology. There is chronic degenerative disc disease at L5-S1. There chronic pars defects at L5 but no slippage. IMPRESSION: No acute abdominal or pelvic finding.  No cause of pain identified. 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 Atherosclerosis of the aorta and its branch vessels. Electronically Signed   By: Nelson Chimes M.D.   On: 09/17/2015 10:09   Dg Chest 2 View  10/08/2015  CLINICAL DATA:  Hyperglycemia.  Hypertension.  Shortness of Breath EXAM: CHEST  2 VIEW COMPARISON:  August 16, 2015 FINDINGS: There is no edema or consolidation. Heart is upper normal in size with pulmonary vascularity within normal limits. No adenopathy. No bone lesions. IMPRESSION: No edema or consolidation. Electronically Signed   By: Lowella Grip III M.D.   On: 10/08/2015 00:12   US Renal  09/17/2015  CLINICAL DATA:  Acute onset of generalized abdominal pain. Initial encounter. EXAM: RENAL / URINARY TRACT ULTRASOUND COMPLETE COMPARISON:  None. FINDINGS: Right Kidney: Length: 10.5 cm. Mildly increased parenchymal echogenicity noted. Trace fluid is noted about the right kidney. No mass or hydronephrosis visualized. Left Kidney: Length: 11.6 cm. Mildly increased parenchymal echogenicity noted. Trace fluid is noted about the left kidney. No mass or hydronephrosis visualized. Bladder: Diffuse bladder wall thickening is noted, measuring 7 mm in thickness. A left ureteral jet is visualized. The prostate is mildly enlarged. IMPRESSION: 1. No evidence of hydronephrosis. 2. Mild nonspecific  perinephric fluid noted bilaterally. 3. Mildly increased renal parenchymal echogenicity raises concern for medical renal disease. 4. Diffuse bladder wall thickening may reflect chronic inflammation. Would correlate for any evidence of cystitis. Left ureteral jet seen. 5. Mildly enlarged prostate. Electronically Signed   By: Garald Balding M.D.   On: 09/17/2015 06:32     EKG: (Independently reviewed) sinus  rhythm with right bundle branch block, voltage criteria met for LVH, no acute ischemic changes, ventricular rate 90 bpm, QTC 487 ms   Assessment & Plan  Principal Problem:   Hypertensive urgency -Admit to telemetry observation status -Chronically elevated blood pressure so would not rapidly correct -Compliance is a major issue so have started short acting Cardizem 120 mg twice daily since this medication can be obtained at [facility] for $4 -No focal neurological symptoms -Likely would benefit from daily ACE inhibitor but given underlying severe chronic kidney disease with proteinuria and lack of patient appropriate follow-up am reluctant to initiate this class of medication  Active Problems:   Diabetes mellitus type 2, uncontrolled ([place]) -CBG rapidly corrected after hydration consistent with mild HHNK -Although most recent hemoglobin A1c 12.7 with compliance and issues and reluctance to use insulin have started Glyburide 5 mg daily; patient tells me he thinks he can take pills better than use insulin -Follow CBGs and utilized SSRIs indicated    Elevated troponin -Has no chest pain or EKG changes and suspect the elevated troponin or reflective of poorly controlled blood pressure with decreased renal perfusion in setting of known chronic kidney disease -Cycle troponin as precaution    CKD (chronic kidney disease) stage 3, GFR 30-59 ml/min -Renal function improved after hydration-likely had transient acute renal failure in setting involving depletion from severe hyperglycemia from medication  noncompliance -Patient underwent renal ultrasound last admission and this revealed medical renal disease -Also incidental finding of diffuse bladder wall thickening that may reflect chronic inflammation (most likely from persistent glycosuria) -Current urinalysis with significant glycosuria and moderate hemoglobin and greater than 300 of protein -I did discuss with patient that 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 and if he does not control his blood pressure and his diabetes he will most likely end up on dialysis    Left ventricular diastolic dysfunction, NYHA class 1 -Secondary to poorly controlled blood pressure -Echocardiogram 08/17/15 revealed preserved LV systolic function with moderate LVH and grade 1 diastolic dysfunction and a trivial pericardial effusion posterior to the heart; no cardiac rub heard on auscultation this admission -Currently compensated without clinical signs of volume overload    Homelessness/Noncompliance with medications -Chronic ongoing issue although patient reports to me he is living in an apartment (I doubt this is true) -Has had multiple evaluations with case management through ER encounters but continues to not fill prescriptions or take medications -Case management consultation to assist in obtaining oral diabetic and antihypertensive medications and to arrange follow-up at one of the clinics -Apparently patient previously had Medicaid in [facility] but has never transitioned his Medicaid over to [place] was in [employer] but is not actively engaged in the [place] system -In review of Care everywhere patient was seen at [facility] internal medicine clinic on 05/19/15 and med list documented at that time as follows; . diltiazem (TIAZAC) 120 MG 24 hr capsule Take 1 capsule (120 mg total) by mouth daily. 30 capsule 3  . furosemide (LASIX) 40 MG tablet Take 1 tablet (40 mg total) by mouth daily for 10 days. 30 tablet 1  . potassium chloride  (KLOR-CON-M, K-DUR) 10 MEQ extended release tablet Take 1 tablet (10 mEq total) by mouth daily. 30 tablet 1  . risperiDONE (RISPERDAL) 1 MG tablet Take 1 tablet (1 mg total) by mouth 2 times daily for 30 days. 60 tablet 2  . atropine 1 % ophthalmic solution Place 1 drop into the right eye 2 times daily for  10 days. 15 mL 0  . cephalexin 500 mg tablet 1 po tid for 7 days 21 tablet 0  . dorzolamide-timolol (COSOPT) 22.3-6.8 mg/mL ophthalmic solution Place 1 drop into the right eye 2 times daily. 10 mL 0  . erythromycin (ROMYCIN) ophthalmic ointment Place into the right eye 4 times daily. 3.5 g 0  . insulin asp prt-insulin aspart (NOVOLOG MIX 70/30) 100 unit/mL (70-30) Solution injection Inject 20 Units into the skin daily with breakfast. 6 mL 11  . lisinopril (PRINIVIL,ZESTRIL) 20 MG tablet Take 1 tablet (20 mg total) by mouth daily. 90 tablet 3  -Based on that note patient has a primary care provider Zollie Pee- office number [phone] - ([facility]) -It was also documented during that visit patient was living at [address] [place] in [facility] -Patient reports no transportation to medical appointments      Alcohol abuse -Patient very evasive about actual alcohol intake reporting "I don't drink that much" -CIWA -Check magnesium level    Chronic anemia -Hemoglobin stable and at baseline of 10.6 -IV folate and thiamine    PTSD (post-traumatic stress disorder) -Unable to locate through care everywhere that patient has been formally diagnosed with PTSD although as noted above is supposed to be taking Risperdal -Patient reports he was in the [employer] but when I asked him whether he served in [place] or [place] or 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; this is not consistent with a diagnosis of service related PTSD -Patient may benefit from formal psychiatric evaluation in the event his alcoholism is masking underlying psychiatric illness such as  schizophrenia or bipolar disorder    Diabetic neuropathy ([place]) with frequent falls -In review of care everywhere patient has been to the ER at [facility] on several occasions for falls likely related to combination of neuropathy and ongoing alcohol intoxication    Gastroparesis due to DM ([place]) -Currently without complaints    DVT Prophylaxis: Lovenox  Family Communication:   No family at bedside  Code Status:  Full code  Condition:  Stable  Discharge disposition: Anticipate discharge back to preadmission environment chest suspect is homeless state-patient appears to be transitioning between 2 [place] possibly 3 counties and receiving medical services based on acute needs as opposed to regular management therefore receiving care from multiple providers  Time spent in minutes : 60      Elleen Coulibaly L. ANP on 10/08/2015 at 9:01 AM  Between 7am to 7pm - Pager - [phone]  After 7pm go to [email or web address] - password [email or web address]  And look for the night coverage person covering me after hours  [facility]

## 2015-10-08 NOTE — ED Notes (Signed)
Reported cbg of 318 to Dr. Wilkie AyeHorton, first  Liter infused. MD acknowledges, allows administration of 2nd liter.

## 2015-10-09 DIAGNOSIS — F1410 Cocaine abuse, uncomplicated: Secondary | ICD-10-CM

## 2015-10-09 DIAGNOSIS — F1010 Alcohol abuse, uncomplicated: Secondary | ICD-10-CM | POA: Diagnosis not present

## 2015-10-09 DIAGNOSIS — D649 Anemia, unspecified: Secondary | ICD-10-CM | POA: Diagnosis not present

## 2015-10-09 DIAGNOSIS — F4310 Post-traumatic stress disorder, unspecified: Secondary | ICD-10-CM

## 2015-10-09 DIAGNOSIS — Z9114 Patient's other noncompliance with medication regimen: Secondary | ICD-10-CM

## 2015-10-09 DIAGNOSIS — I160 Hypertensive urgency: Secondary | ICD-10-CM | POA: Diagnosis not present

## 2015-10-09 DIAGNOSIS — Z9119 Patient's noncompliance with other medical treatment and regimen: Secondary | ICD-10-CM | POA: Diagnosis not present

## 2015-10-09 DIAGNOSIS — E1143 Type 2 diabetes mellitus with diabetic autonomic (poly)neuropathy: Secondary | ICD-10-CM

## 2015-10-09 DIAGNOSIS — I519 Heart disease, unspecified: Secondary | ICD-10-CM

## 2015-10-09 DIAGNOSIS — K3184 Gastroparesis: Secondary | ICD-10-CM

## 2015-10-09 LAB — GLUCOSE, CAPILLARY
GLUCOSE-CAPILLARY: 278 mg/dL — AB (ref 65–99)
GLUCOSE-CAPILLARY: 435 mg/dL — AB (ref 65–99)
Glucose-Capillary: 313 mg/dL — ABNORMAL HIGH (ref 65–99)

## 2015-10-09 LAB — BASIC METABOLIC PANEL
ANION GAP: 6 (ref 5–15)
BUN: 34 mg/dL — AB (ref 6–20)
CHLORIDE: 99 mmol/L — AB (ref 101–111)
CO2: 26 mmol/L (ref 22–32)
Calcium: 8.3 mg/dL — ABNORMAL LOW (ref 8.9–10.3)
Creatinine, Ser: 1.98 mg/dL — ABNORMAL HIGH (ref 0.61–1.24)
GFR calc Af Amer: 40 mL/min — ABNORMAL LOW (ref >60)
GFR calc non Af Amer: 34 mL/min — ABNORMAL LOW (ref >60)
GLUCOSE: 289 mg/dL — AB (ref 65–99)
POTASSIUM: 4.3 mmol/L (ref 3.5–5.1)
Sodium: 131 mmol/L — ABNORMAL LOW (ref 135–145)

## 2015-10-09 LAB — TROPONIN I: TROPONIN I: 0.04 ng/mL — AB (ref <0.031)

## 2015-10-09 LAB — HEMOGLOBIN A1C
HEMOGLOBIN A1C: 14.7 % — AB (ref 4.8–5.6)
MEAN PLASMA GLUCOSE: 375 mg/dL

## 2015-10-09 LAB — MAGNESIUM: Magnesium: 1.8 mg/dL (ref 1.7–2.4)

## 2015-10-09 MED ORDER — AMLODIPINE BESYLATE 10 MG PO TABS
10.0000 mg | ORAL_TABLET | Freq: Every day | ORAL | Status: DC
  Administered 2015-10-09: 10 mg via ORAL
  Filled 2015-10-09: qty 1

## 2015-10-09 MED ORDER — CLONIDINE HCL 0.1 MG PO TABS
0.1000 mg | ORAL_TABLET | Freq: Every day | ORAL | Status: DC
  Administered 2015-10-09: 0.1 mg via ORAL
  Filled 2015-10-09: qty 1

## 2015-10-09 MED ORDER — INSULIN ASPART 100 UNIT/ML ~~LOC~~ SOLN
22.0000 [IU] | Freq: Once | SUBCUTANEOUS | Status: AC
  Administered 2015-10-09: 22 [IU] via SUBCUTANEOUS

## 2015-10-09 MED ORDER — FOLIC ACID 1 MG PO TABS
1.0000 mg | ORAL_TABLET | Freq: Every day | ORAL | Status: DC
  Administered 2015-10-09: 1 mg via ORAL
  Filled 2015-10-09: qty 1

## 2015-10-09 NOTE — Progress Notes (Signed)
Security was called, as pt was being loud and belligerent.  Pt continues to call out for additional food in between meals, and his CBG results are now 435.   With CN, RN, NT, and Security in the room, we discussed a Plan of Care with pt concerning him receiving additional snacks with high blood sugars.  The following Plan of Care is now in place, and pt is aware: Pt will NOT receive any in between meal snacks. Pt WILL receive one bedtime snack. If pt becomes uncooperative and belligerent again relating to this issue, please notify Security, so that they may come to room and will explain to pt again.

## 2015-10-09 NOTE — Progress Notes (Signed)
Pt continues to refused attempting to gain PIV.

## 2015-10-09 NOTE — Progress Notes (Signed)
Pt refused IV from IV team.

## 2015-10-09 NOTE — Progress Notes (Addendum)
Pt not only refuses PIV access, he is now refusing CBG check.  Pt is uncooperative, belligerent, and threatening to staff.  Pt constantly calls out for ice cream, hot dogs, peanut butter, graham crackers, even though his most recent blood sugar results were 278.  MD has been notified.

## 2015-10-09 NOTE — Progress Notes (Signed)
[facility] PROGRESS NOTE   Clayton [name] [id number]:[id number] DOB: 1951-01-08 DOA: 10/07/2015 PCP: No PCP Per Patient  HPI/Subjective: Agent was eating his breakfast this morning when I saw him, denies any significant complaints. Denies any fever or chills. Blood pressure in the high side, refused initially any peripheral IV. Note a tiny so he can restart the IV blood pressure medications.  Assessment/Plan: Principal Problem:   Hypertensive urgency Active Problems:   Homelessness   Noncompliance with medications   PTSD (post-traumatic stress disorder)   Diabetic neuropathy (HCC)   Gastroparesis due to DM (HCC)   Alcohol abuse   Diabetes mellitus type 2, uncontrolled (HCC)   Chronic anemia   Elevated troponin   CKD (chronic kidney disease) stage 3, GFR 30-59 ml/min   Left ventricular diastolic dysfunction, NYHA class 1   Cocaine abuse    Hypertensive urgency -Admit to telemetry observation status -Chronically elevated blood pressure so would not rapidly correct -No focal neurological symptoms -Likely would benefit from daily ACEI, reluctant to initiate as his known for nonadherence. -Started on amlodipine and clonidine as needed orally.    Diabetes mellitus type 2, uncontrolled (HCC) -Mild HHNC, treated with aggressive hydration with IV fluids, subcutaneous insulin -hemoglobin A1c is 14.7, this is correlate with mean plasma glucose of 375.  -Follow CBGs and utilized SSRIs indicated   Elevated troponin -Has no chest pain or EKG changes and suspect the elevated troponin or reflective of poorly controlled blood pressure with decreased renal perfusion in setting of known chronic kidney disease -Cycle troponin as precaution   CKD (chronic kidney disease) stage 3, GFR 30-59 ml/min -Renal function improved after hydration-likely had transient acute renal failure in setting involving depletion from severe hyperglycemia from medication noncompliance -Patient underwent  renal ultrasound last admission and this revealed medical renal disease   Left ventricular diastolic dysfunction, NYHA class 1 -Secondary to poorly controlled blood pressure -Echocardiogram 08/17/15 revealed preserved LV systolic function with moderate LVH and grade 1 diastolic dysfunction and a trivial pericardial effusion posterior to the heart; no cardiac rub heard on auscultation this admission   Homelessness/Noncompliance with medications -Chronic ongoing issue although patient reports to me he is living in an apartment (I doubt this is true) -It was also documented during that visit patient was living at [facility] [place] - CSW consultation to help with the disposition of the patient.    Alcohol abuse -Patient very evasive about actual alcohol intake reporting "I don't drink that much" - potassium and magnesium levels are okay, alcohol level less than 5.    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; this is not consistent with a diagnosis of service related PTSD -Patient may benefit from formal psychiatric evaluation in the event his alcoholism is masking underlying psychiatric illness such as schizophrenia or bipolar disorder   Diabetic neuropathy (HCC) with frequent falls -In review of care everywhere patient [facility] several occasions for falls likely related to combination of neuropathy and ongoing alcohol intoxication   Gastroparesis due to DM [facility](HCC) -Currently without complaints   Code Status: Full Code Family  Communication: Plan discussed with the  patient. Disposition Plan: Remains inpatient Diet: Diet Carb Modified Fluid consistency:: Thin; Room service appropriate?: Yes  Consultants:  None  Procedures:  None  Antibiotics: None  Objective: Filed Vitals:   10/09/15 1248  BP: 127/75  Pulse: 80  Temp: 97.9 F (36.6 C)  Resp: 20    Intake/Output Summary (Last 24 hours) at 10/09/15 1301 Last data filed at 10/09/15 1250  Gross per 24 hour  Intake   2004 ml  Output    700 ml  Net   1304 ml   Filed Weights   10/07/15 2217 10/08/15 0958  Weight: 81.647 kg (180 lb) 78.6 kg (173 lb 4.5 oz)    Exam: General: Alert and awake, oriented x3, not in any acute distress. HEENT: anicteric sclera, pupils reactive to light and accommodation, EOMI CVS: S1-S2 clear, no murmur rubs or gallops Chest: clear to auscultation bilaterally, no wheezing, rales or rhonchi Abdomen: soft nontender, nondistended, normal bowel sounds, no organomegaly Extremities: no cyanosis, clubbing or edema noted bilaterally Neuro: Cranial nerves II-XII intact, no focal neurological deficits  Data Reviewed: Basic Metabolic Panel:  Recent Labs Lab 10/07/15 2257 10/08/15 0530 10/09/15 0025  NA 125* 134* 131*  K 4.3 3.8 4.3  CL 89* 99* 99*  CO2 GLUCOSE 568* 284* 289*  BUN 37* 31* 34*  CREATININE 2.04* 1.73* 1.98*  CALCIUM 9.0 8.7* 8.3*  MG  --   --  1.8   Liver Function Tests:  Recent Labs Lab 10/07/15 2257  AST 37  ALT 38  ALKPHOS 150*  BILITOT 0.7  PROT 6.7  ALBUMIN 3.0*   No results for input(s): LIPASE, AMYLASE in the last 168 hours. No results for input(s): AMMONIA in the last 168 hours. CBC:  Recent Labs Lab 10/07/15 2257  WBC 4.6  HGB 10.6*  HCT 31.4*  MCV 87.5  PLT 187   Cardiac Enzymes:  Recent Labs Lab 10/07/15 2310 10/08/15 0530 10/09/15 0025  TROPONINI 0.05* 0.04* 0.04*   BNP (last 3 results)  Recent Labs  08/17/15 0330  BNP 303.5*    ProBNP (last 3 results) No results for input(s):  PROBNP in the last 8760 hours.  CBG:  Recent Labs Lab 10/08/15 1106 10/08/15 1617 10/08/15 2139 10/09/15 0608 10/09/15 1140  GLUCAP 484* 482* 315* 313* 278*    Micro No results found for this or any previous visit (from the past 240 hour(s)).   Studies: Dg Chest 2 View  10/08/2015  CLINICAL DATA:  Hyperglycemia.  Hypertension.  Shortness of Breath EXAM: CHEST  2 VIEW COMPARISON:  August 16, 2015 FINDINGS: There is no edema or consolidation. Heart is upper normal in size with pulmonary vascularity within normal limits. No adenopathy. No bone lesions. IMPRESSION: No edema or consolidation. Electronically Signed   By: Bretta Bang III M.D.   On: 10/08/2015 00:12    Scheduled Meds: . amLODipine  10 mg Oral Daily  . cloNIDine  0.1 mg Oral Daily  . enoxaparin (LOVENOX) injection  40 mg Subcutaneous Q24H  . folic acid  1 mg Oral Daily  . glyBURIDE  5 mg Oral Q breakfast  . insulin aspart  0-20 Units Subcutaneous TID WC  . insulin aspart  0-5 Units Subcutaneous QHS  . LORazepam  0-4 mg Oral Q6H   Followed by  . [START ON 10/10/2015] LORazepam  0-4 mg Oral Q12H  . multivitamin with minerals  1 tablet Oral Daily  . sodium chloride  3  mL Intravenous Q12H  . thiamine  100 mg Oral Daily   Continuous Infusions: . sodium chloride 150 mL/hr at 10/08/15 1723       Time spent: 35 minutes    [facility] A  [facility] Pager [phone] If 7PM-7AM, please contact night-coverage at [email or web address], password [facility] 10/09/2015, 1:01 PM  LOS: 1 day

## 2015-10-09 NOTE — Care Management Note (Addendum)
Case Management Note  Patient Details  Name: Clayton [name] MRN: [id number] Date of Birth: 25-Oct-1951  Subjective/Objective: 64 y.o. M who has a long hx of IDDM, Homelessness, HTN. Non compliance,PTSD, and multiple visits with RNCMs and CSWs. The last of which was with [facility] in the ED on 09/28/2015. [facility] on Tuesday 10/10/2015.   [facility]. Has Medicaid for Medications                Action/Plan:Discharge    Expected Discharge Date:  10/10/15               Expected Discharge Plan:     In-House Referral:     Discharge planning Services     Post Acute Care Choice:    Choice offered to:     DME Arranged:    DME Agency:     HH Arranged:    HH Agency:     Status of Service:     Medicare Important Message Given:    Date Medicare IM Given:    Medicare IM give by:    Date Additional Medicare IM Given:    Additional Medicare Important Message give by:     If discussed at Long Length of Stay Meetings, dates discussed:    Additional Comments:Unsure how CM  dept can assist this gentleman any more than we have in the past.Will reinforce that pt should attend appt scheduled for tomorrow or call to reschedule.    Yvone NeuCrutchfield, Ebbie Sorenson M, RN 10/09/2015, 3:37 PM

## 2015-10-09 NOTE — Progress Notes (Signed)
BP 199/86, HR 86.  PT has no IV access and continues to refuse PIV.  MD notified.

## 2015-10-09 NOTE — Progress Notes (Addendum)
Inpatient Diabetes Program Recommendations  AACE/ADA: New Consensus Statement on Inpatient Glycemic Control (2015)  Target Ranges:  Prepandial:   less than 140 mg/dL      Peak postprandial:   less than 180 mg/dL (1-2 hours)      Critically ill patients:  140 - 180 mg/dL  Results for Clayton [name], [name] (MRN [id number]) as of 10/09/2015 10:56  Ref. Range 10/08/2015 01:05 10/08/2015 02:39 10/08/2015 11:06 10/08/2015 16:17 10/08/2015 21:39 10/09/2015 06:08  Glucose-Capillary Latest Ref Range: 65-99 mg/dL 782318 (H) 956246 (H) 213484 (H) 482 (H) 315 (H) 313 (H)   Review of Glycemic Control  Diabetes history: DM2 Outpatient Diabetes medications: None Current orders for Inpatient glycemic control: Novolog 0-20 units TID with meals, Novolog 0-5 units HS, Glyburide 5 mg QAM  Inpatient Diabetes Program Recommendations: Oral Agents: H&P notes that patient is reluctant to use insulin. Therefore, recommend increasing Glyburide to 10 mg BID. HgbA1C: A1C 14.7% on 10/08/15.   10/09/15@12 :35-Spoke with patient about diabetes and home regimen for diabetes control. Patient was asleep upon entering room and woke easily to verbal stimulus. Patient opened eyes and then closed them after making eye contact and he stated he was sleeping right now. Asked patient if he could talk with me for a few minutes and he stated "yes, for a few minutes". Throughout the conversation patient kept his eyes closed and answered questions he was asked but did not contribute any other information to the conversation. Patient reports that he is not followed by any doctor at this time for diabetes and that he is not taking any medications for diabetes as an outpatient.  Inquired about knowledge about A1C and patient reports that he does not know what an A1C is. Discussed A1C results (14.7% on 10/08/15) and explained what an A1C is and informed patient that his current A1C indicates his average glucose is 387 mg/dl. Discussed basic pathophysiology of  DM Type 2, basic home care, importance of checking CBGs and maintaining good CBG control to prevent long-term and short-term complications. Patient reports that he lives in an apartment and that he does not have a glucometer or testing supplies. Discussed glucose and A1C target values. Explained how hyperglycemia leads to damage within blood vessels which leads to complications commonly see with uncontrolled diabetes. Discussed complications commonly seen with uncontrolled diabetes and stressed importance of getting diabetes controlled. Inquired about insulin and patient states that he has taken insulin in the past and he is not going to take insulin but he is willing to take oral DM medications if prescribed. Encouraged patient to check his glucose 3-4 times per day and to keep a log of glucose values which he will need to take to follow up appointment with whomever he establishes care with.  Encouraged patient to be compliant with diet, exercise, monitoring, taking medications, and following up with doctor. Patient verbalized understanding of information discussed and he states that he has no further questions at this time related to diabetes.   Thanks, Orlando PennerMarie Leaner Morici, RN, MSN, CDE Diabetes Coordinator Inpatient Diabetes Program [phone] (Team Pager from 8am to 5pm) [phone] ([facility] office) [phone] [facility] office) [phone] [facility] office)

## 2015-10-10 ENCOUNTER — Ambulatory Visit: Payer: Medicaid Other | Admitting: Family Medicine

## 2015-10-10 NOTE — Progress Notes (Signed)
The patient became belligerent and demanded to be taken outside to smoke.  He refused a blood sugar check and took off his telemetry box.  He also did not want a Nicotine patch and refused to put his telemetry box back on.  The CN spoke with the patient and security was called.  Kirtland BouchardK. Schorr was text page by the CN.  After security spoke with the patient, he laid down in bed and rested quietly.

## 2015-10-10 NOTE — Progress Notes (Addendum)
Pt refuses all medication.  Pt refuses IV placement.  Pt refuses to gain CBG.  Pt is leaving AMA.  Security has been called to assist pt in leaving, as he refuses to leave unless he gets paperwork, however, paperwork is not available for pts who leave AMA, as unable to print AVS.  Pt has already refused to wear telemetry, so it has already been removed, and CCMD notified.

## 2015-10-10 NOTE — Progress Notes (Signed)
Pt wants to be transferred to a medical floor, so he can go downstairs to smoke whenever he wants. Pt on telemetry and refuses Nicotine Patch. Security has been called twice during this shift related to this request. Pt refusing telemetry as well. Will continue to monitor.

## 2015-10-10 NOTE — Progress Notes (Signed)
Pt demanded snack tonight. However, pt has refused to have his blood sugar checked again after snack given. Will continue to monitor.

## 2015-10-10 NOTE — Progress Notes (Signed)
Patient continues to be very irritable but is sometimes cooperative. Patient continues to refuse scheduled Ativan medication, refuses to wear telemetry monitor, and refuses to allow staff to take blood sugar and/or give insulin if needed. Patient did allow nurse to assess him. Patient denies any pain or discomfort. Night snack given (soda crackers and milk). Will continue to monitor patient to end of shift.

## 2015-10-18 NOTE — Discharge Summary (Signed)
Physician Discharge Summary  Clayton [name] [id number] DOB: 03-12-1951 DOA: 10/07/2015  PCP: No PCP Per Patient  Admit date: 10/07/2015 Discharge date: 10/18/2015  Time spent: 0 minutes  Recommendations for Outpatient Follow-up:   Patient left AMA  Discharge Diagnoses:  Principal Problem:   Hypertensive urgency Active Problems:   Homelessness   Noncompliance with medications   PTSD (post-traumatic stress disorder)   Diabetic neuropathy (HCC)   Gastroparesis due to DM (HCC)   Alcohol abuse   Diabetes mellitus type 2, uncontrolled (HCC)   Chronic anemia   Elevated troponin   CKD (chronic kidney disease) stage 3, GFR 30-59 ml/min   Left ventricular diastolic dysfunction, NYHA class 1   Cocaine abuse   Discharge Condition: Stable  Diet recommendation: Carbohydrate modified  Filed Weights   10/08/15 0958 10/10/15 0415  Weight: 78.6 kg (173 lb 4.5 oz) 80.831 kg (178 lb 3.2 oz)    History of present illness:  This is a 64 year old male patient known chronic medication and therapies noncompliance as well as homelessness. Documented history of PTSD and alcohol abuse as well as diabetes and hypertension. Patient presented to the ER with reports of nausea and tingling in his hands and feeling like something was wrong. He initially reported to the ER physician that he had not been following his CBGs and had not been taking medication stating he was out of them. After further questioning it was determined he had actually not filled any of his prescriptions since the last admission. He also reported to the EDP that he did not take his insulin because he does not use it. He reports chronic shortness of breath was not having any chest pain or fever cough source of breath or increased swelling abdominal pain vomiting or diarrhea. States that he drinks a can of beer every couple of days. When I questioned him regarding his alcohol use, he stated he doesn't drink that much and but would not  quantify how much he drank. He denies previous alcohol withdrawal episodes. Of note patient has multiple recent ER visit secondary to medical noncompliance. During each ER visit he is thoroughly evaluated by case management and social worker with the plan set in place which the patient does not adhere to. He was admitted earlier in the month with abdominal pain and gastroparesis secondary to uncontrolled diabetes but on 10/10 left AGAINST MEDICAL ADVICE.  In the ER patient was afebrile, blood pressure was elevated at 191/102, pulse was 91 and regular, room air saturations were 100%. Chest x-ray unremarkable and no evidence of volume overload. Patient had moderate to severe hyperglycemia with serum glucose 568 with an associated sodium of 125. BUN was 37 and creatinine 2.4 which is slightly higher than patient's baseline of around 1.5-1.7. Patient was given 2 L of IV fluid in the ER with subsequent improvement in his renal function BUN now 31 and creatinine 1.73 with repeat labs. With hydration his glucose improved to 284 and sodium increased to 134. Anion gap remained normal. Troponin mildly elevated at 0.05 with no EKG changes and no reports of chest pain. Repeat troponin was 0.04. For his hypertension he was given a dose of hydralazine 10 mg 1 last recent blood pressure remained elevated at 198/94. Because of poorly controlled blood pressure patient is being admitted for hypertensive urgency.  Hospital Course:   Left AMA  Procedures:  None  Consultations:  None  Discharge Exam: Filed Vitals:   10/10/15 0415  BP: 182/90  Pulse: 87  Temp:  98.2 F (36.8 C)  Resp: 18   General: Alert and awake, oriented x3, not in any acute distress. HEENT: anicteric sclera, pupils reactive to light and accommodation, EOMI CVS: S1-S2 clear, no murmur rubs or gallops Chest: clear to auscultation bilaterally, no wheezing, rales or rhonchi Abdomen: soft nontender, nondistended, normal bowel sounds, no  organomegaly Extremities: no cyanosis, clubbing or edema noted bilaterally Neuro: Cranial nerves II-XII intact, no focal neurological deficits  Discharge Instructions    There are no discharge medications for this patient.  No Known Allergies Follow-up Information    Follow up with [facility].   Why:  As scheduled on 11/1   Contact information:   [address] [place] [address] [phone]       The results of significant diagnostics from this hospitalization (including imaging, microbiology, ancillary and laboratory) are listed below for reference.    Significant Diagnostic Studies: Dg Chest 2 View  10/08/2015  CLINICAL DATA:  Hyperglycemia.  Hypertension.  Shortness of Breath EXAM: CHEST  2 VIEW COMPARISON:  August 16, 2015 FINDINGS: There is no edema or consolidation. Heart is upper normal in size with pulmonary vascularity within normal limits. No adenopathy. No bone lesions. IMPRESSION: No edema or consolidation. Electronically Signed   By: Bretta Bang III M.D.   On: 10/08/2015 00:12    Microbiology: No results found for this or any previous visit (from the past 240 hour(s)).   Labs: Basic Metabolic Panel: No results for input(s): NA, K, CL, CO2, GLUCOSE, BUN, CREATININE, CALCIUM, MG, PHOS in the last 168 hours. Liver Function Tests: No results for input(s): AST, ALT, ALKPHOS, BILITOT, PROT, ALBUMIN in the last 168 hours. No results for input(s): LIPASE, AMYLASE in the last 168 hours. No results for input(s): AMMONIA in the last 168 hours. CBC: No results for input(s): WBC, NEUTROABS, HGB, HCT, MCV, PLT in the last 168 hours. Cardiac Enzymes: No results for input(s): CKTOTAL, CKMB, CKMBINDEX, TROPONINI in the last 168 hours. BNP: BNP (last 3 results)  Recent Labs  08/17/15 0330  BNP 303.5*    ProBNP (last 3 results) No results for input(s): PROBNP in the last 8760 hours.  CBG: No results for  input(s): GLUCAP in the last 168 hours.     Signed:  Alya Smaltz A  [facility] 10/18/2015, 5:21 PM

## 2015-10-31 ENCOUNTER — Encounter (HOSPITAL_COMMUNITY): Payer: Self-pay | Admitting: Emergency Medicine

## 2015-10-31 ENCOUNTER — Observation Stay (HOSPITAL_COMMUNITY): Payer: Medicaid Other

## 2015-10-31 ENCOUNTER — Inpatient Hospital Stay (HOSPITAL_COMMUNITY)
Admission: EM | Admit: 2015-10-31 | Discharge: 2015-11-01 | DRG: 312 | Discharge status: Against Medical Advice | Payer: Medicaid Other | Attending: Internal Medicine | Admitting: Internal Medicine

## 2015-10-31 ENCOUNTER — Emergency Department (HOSPITAL_COMMUNITY): Payer: Medicaid Other

## 2015-10-31 ENCOUNTER — Emergency Department (HOSPITAL_COMMUNITY)
Admission: EM | Admit: 2015-10-31 | Discharge: 2015-10-31 | Discharge status: Against Medical Advice | Payer: Medicaid Other | Source: Home / Self Care | Attending: Emergency Medicine | Admitting: Emergency Medicine

## 2015-10-31 DIAGNOSIS — Z8719 Personal history of other diseases of the digestive system: Secondary | ICD-10-CM

## 2015-10-31 DIAGNOSIS — F1410 Cocaine abuse, uncomplicated: Secondary | ICD-10-CM | POA: Diagnosis present

## 2015-10-31 DIAGNOSIS — F17210 Nicotine dependence, cigarettes, uncomplicated: Secondary | ICD-10-CM | POA: Insufficient documentation

## 2015-10-31 DIAGNOSIS — F1010 Alcohol abuse, uncomplicated: Secondary | ICD-10-CM | POA: Diagnosis present

## 2015-10-31 DIAGNOSIS — Z91040 Latex allergy status: Secondary | ICD-10-CM

## 2015-10-31 DIAGNOSIS — D6489 Other specified anemias: Secondary | ICD-10-CM | POA: Diagnosis present

## 2015-10-31 DIAGNOSIS — F1910 Other psychoactive substance abuse, uncomplicated: Secondary | ICD-10-CM | POA: Diagnosis present

## 2015-10-31 DIAGNOSIS — Z833 Family history of diabetes mellitus: Secondary | ICD-10-CM

## 2015-10-31 DIAGNOSIS — E1165 Type 2 diabetes mellitus with hyperglycemia: Secondary | ICD-10-CM | POA: Diagnosis present

## 2015-10-31 DIAGNOSIS — I10 Essential (primary) hypertension: Secondary | ICD-10-CM | POA: Insufficient documentation

## 2015-10-31 DIAGNOSIS — Z9119 Patient's noncompliance with other medical treatment and regimen: Secondary | ICD-10-CM

## 2015-10-31 DIAGNOSIS — N183 Chronic kidney disease, stage 3 (moderate): Secondary | ICD-10-CM | POA: Diagnosis present

## 2015-10-31 DIAGNOSIS — E1122 Type 2 diabetes mellitus with diabetic chronic kidney disease: Secondary | ICD-10-CM | POA: Diagnosis present

## 2015-10-31 DIAGNOSIS — E1143 Type 2 diabetes mellitus with diabetic autonomic (poly)neuropathy: Secondary | ICD-10-CM | POA: Diagnosis present

## 2015-10-31 DIAGNOSIS — E876 Hypokalemia: Secondary | ICD-10-CM | POA: Diagnosis present

## 2015-10-31 DIAGNOSIS — F4310 Post-traumatic stress disorder, unspecified: Secondary | ICD-10-CM | POA: Diagnosis present

## 2015-10-31 DIAGNOSIS — K056 Periodontal disease, unspecified: Secondary | ICD-10-CM | POA: Diagnosis present

## 2015-10-31 DIAGNOSIS — E860 Dehydration: Secondary | ICD-10-CM | POA: Diagnosis present

## 2015-10-31 DIAGNOSIS — R4182 Altered mental status, unspecified: Secondary | ICD-10-CM | POA: Diagnosis present

## 2015-10-31 DIAGNOSIS — K3184 Gastroparesis: Secondary | ICD-10-CM | POA: Diagnosis present

## 2015-10-31 DIAGNOSIS — R55 Syncope and collapse: Secondary | ICD-10-CM

## 2015-10-31 DIAGNOSIS — Z823 Family history of stroke: Secondary | ICD-10-CM | POA: Diagnosis not present

## 2015-10-31 DIAGNOSIS — I951 Orthostatic hypotension: Secondary | ICD-10-CM | POA: Diagnosis present

## 2015-10-31 DIAGNOSIS — D649 Anemia, unspecified: Secondary | ICD-10-CM

## 2015-10-31 DIAGNOSIS — I1310 Hypertensive heart and chronic kidney disease without heart failure, with stage 1 through stage 4 chronic kidney disease, or unspecified chronic kidney disease: Secondary | ICD-10-CM | POA: Diagnosis present

## 2015-10-31 DIAGNOSIS — Z9114 Patient's other noncompliance with medication regimen: Secondary | ICD-10-CM

## 2015-10-31 DIAGNOSIS — Y900 Blood alcohol level of less than 20 mg/100 ml: Secondary | ICD-10-CM | POA: Diagnosis present

## 2015-10-31 DIAGNOSIS — R739 Hyperglycemia, unspecified: Secondary | ICD-10-CM

## 2015-10-31 DIAGNOSIS — Z8659 Personal history of other mental and behavioral disorders: Secondary | ICD-10-CM | POA: Insufficient documentation

## 2015-10-31 DIAGNOSIS — E1140 Type 2 diabetes mellitus with diabetic neuropathy, unspecified: Secondary | ICD-10-CM | POA: Diagnosis present

## 2015-10-31 DIAGNOSIS — R0689 Other abnormalities of breathing: Secondary | ICD-10-CM | POA: Diagnosis present

## 2015-10-31 DIAGNOSIS — E1121 Type 2 diabetes mellitus with diabetic nephropathy: Secondary | ICD-10-CM | POA: Diagnosis present

## 2015-10-31 DIAGNOSIS — Z590 Homelessness: Secondary | ICD-10-CM

## 2015-10-31 DIAGNOSIS — Z5321 Procedure and treatment not carried out due to patient leaving prior to being seen by health care provider: Secondary | ICD-10-CM | POA: Diagnosis not present

## 2015-10-31 LAB — CBC WITH DIFFERENTIAL/PLATELET
BASOS ABS: 0 10*3/uL (ref 0.0–0.1)
BASOS PCT: 0 %
EOS ABS: 0.2 10*3/uL (ref 0.0–0.7)
Eosinophils Relative: 4 %
HCT: 33.7 % — ABNORMAL LOW (ref 39.0–52.0)
HEMOGLOBIN: 11.4 g/dL — AB (ref 13.0–17.0)
Lymphocytes Relative: 42 %
Lymphs Abs: 2 10*3/uL (ref 0.7–4.0)
MCH: 30 pg (ref 26.0–34.0)
MCHC: 33.8 g/dL (ref 30.0–36.0)
MCV: 88.7 fL (ref 78.0–100.0)
Monocytes Absolute: 0.4 10*3/uL (ref 0.1–1.0)
Monocytes Relative: 8 %
NEUTROS PCT: 46 %
Neutro Abs: 2.3 10*3/uL (ref 1.7–7.7)
Platelets: 183 10*3/uL (ref 150–400)
RBC: 3.8 MIL/uL — AB (ref 4.22–5.81)
RDW: 13.6 % (ref 11.5–15.5)
WBC: 4.9 10*3/uL (ref 4.0–10.5)

## 2015-10-31 LAB — BASIC METABOLIC PANEL
Anion gap: 7 (ref 5–15)
BUN: 25 mg/dL — AB (ref 6–20)
CHLORIDE: 102 mmol/L (ref 101–111)
CO2: 29 mmol/L (ref 22–32)
CREATININE: 1.7 mg/dL — AB (ref 0.61–1.24)
Calcium: 9.1 mg/dL (ref 8.9–10.3)
GFR calc non Af Amer: 41 mL/min — ABNORMAL LOW (ref >60)
GFR, EST AFRICAN AMERICAN: 48 mL/min — AB (ref >60)
Glucose, Bld: 88 mg/dL (ref 65–99)
Potassium: 3.4 mmol/L — ABNORMAL LOW (ref 3.5–5.1)
Sodium: 138 mmol/L (ref 135–145)

## 2015-10-31 LAB — URINE MICROSCOPIC-ADD ON

## 2015-10-31 LAB — COMPREHENSIVE METABOLIC PANEL
ALBUMIN: 2.7 g/dL — AB (ref 3.5–5.0)
ALK PHOS: 128 U/L — AB (ref 38–126)
ALT: 48 U/L (ref 17–63)
AST: 68 U/L — ABNORMAL HIGH (ref 15–41)
Anion gap: 7 (ref 5–15)
BUN: 26 mg/dL — ABNORMAL HIGH (ref 6–20)
CALCIUM: 8.9 mg/dL (ref 8.9–10.3)
CHLORIDE: 101 mmol/L (ref 101–111)
CO2: 27 mmol/L (ref 22–32)
CREATININE: 1.79 mg/dL — AB (ref 0.61–1.24)
GFR calc Af Amer: 45 mL/min — ABNORMAL LOW (ref >60)
GFR calc non Af Amer: 39 mL/min — ABNORMAL LOW (ref >60)
GLUCOSE: 192 mg/dL — AB (ref 65–99)
Potassium: 6.1 mmol/L (ref 3.5–5.1)
SODIUM: 135 mmol/L (ref 135–145)
Total Bilirubin: 1 mg/dL (ref 0.3–1.2)
Total Protein: 5.4 g/dL — ABNORMAL LOW (ref 6.5–8.1)

## 2015-10-31 LAB — CBC
HCT: 29.8 % — ABNORMAL LOW (ref 39.0–52.0)
Hemoglobin: 10 g/dL — ABNORMAL LOW (ref 13.0–17.0)
MCH: 29.5 pg (ref 26.0–34.0)
MCHC: 33.6 g/dL (ref 30.0–36.0)
MCV: 87.9 fL (ref 78.0–100.0)
Platelets: 151 10*3/uL (ref 150–400)
RBC: 3.39 MIL/uL — AB (ref 4.22–5.81)
RDW: 13 % (ref 11.5–15.5)
WBC: 3.4 10*3/uL — ABNORMAL LOW (ref 4.0–10.5)

## 2015-10-31 LAB — CBG MONITORING, ED
GLUCOSE-CAPILLARY: 212 mg/dL — AB (ref 65–99)
GLUCOSE-CAPILLARY: 339 mg/dL — AB (ref 65–99)

## 2015-10-31 LAB — RAPID URINE DRUG SCREEN, HOSP PERFORMED
Amphetamines: NOT DETECTED
BENZODIAZEPINES: NOT DETECTED
Barbiturates: NOT DETECTED
COCAINE: POSITIVE — AB
OPIATES: NOT DETECTED
Tetrahydrocannabinol: NOT DETECTED

## 2015-10-31 LAB — URINALYSIS, ROUTINE W REFLEX MICROSCOPIC
BILIRUBIN URINE: NEGATIVE
Glucose, UA: >1000 mg/dL — AB
KETONES UR: NEGATIVE mg/dL
LEUKOCYTES UA: NEGATIVE
Nitrite: NEGATIVE
Protein, ur: >300 mg/dL — AB
SPECIFIC GRAVITY, URINE: 1.029 (ref 1.005–1.030)
pH: 6 (ref 5.0–8.0)

## 2015-10-31 LAB — ETHANOL

## 2015-10-31 LAB — POTASSIUM: POTASSIUM: 3.5 mmol/L (ref 3.5–5.1)

## 2015-10-31 LAB — I-STAT CG4 LACTIC ACID, ED: Lactic Acid, Venous: 2.69 mmol/L (ref 0.5–2.0)

## 2015-10-31 LAB — GLUCOSE, CAPILLARY: GLUCOSE-CAPILLARY: 419 mg/dL — AB (ref 65–99)

## 2015-10-31 MED ORDER — ACETAMINOPHEN 325 MG PO TABS: 650.0000 mg | ORAL_TABLET | Freq: Four times a day (QID) | ORAL | Status: DC | PRN

## 2015-10-31 MED ORDER — NALOXONE HCL 2 MG/2ML IJ SOSY
2.0000 mg | PREFILLED_SYRINGE | Freq: Once | INTRAMUSCULAR | Status: AC
  Administered 2015-10-31: 2 mg via INTRAVENOUS
  Filled 2015-10-31: qty 2

## 2015-10-31 MED ORDER — ENOXAPARIN SODIUM 40 MG/0.4ML ~~LOC~~ SOLN
40.0000 mg | Freq: Every day | SUBCUTANEOUS | Status: DC
  Administered 2015-10-31: 40 mg via SUBCUTANEOUS
  Filled 2015-10-31 (×2): qty 0.4

## 2015-10-31 MED ORDER — SODIUM CHLORIDE 0.9 % IJ SOLN: 3.0000 mL | Freq: Two times a day (BID) | INTRAMUSCULAR | Status: DC

## 2015-10-31 MED ORDER — INSULIN ASPART 100 UNIT/ML ~~LOC~~ SOLN: 0.0000 [IU] | Freq: Every day | SUBCUTANEOUS | Status: DC

## 2015-10-31 MED ORDER — GLYBURIDE 5 MG PO TABS
5.0000 mg | ORAL_TABLET | Freq: Every day | ORAL | Status: DC
  Administered 2015-10-31: 5 mg via ORAL
  Filled 2015-10-31 (×3): qty 1

## 2015-10-31 MED ORDER — FOLIC ACID 1 MG PO TABS
1.0000 mg | ORAL_TABLET | Freq: Every day | ORAL | Status: DC
  Administered 2015-10-31: 1 mg via ORAL
  Filled 2015-10-31: qty 1

## 2015-10-31 MED ORDER — ADULT MULTIVITAMIN W/MINERALS CH
1.0000 | ORAL_TABLET | Freq: Every day | ORAL | Status: DC
  Administered 2015-10-31: 1 via ORAL
  Filled 2015-10-31: qty 1

## 2015-10-31 MED ORDER — NALOXONE HCL 0.4 MG/ML IJ SOLN
0.4000 mg | Freq: Once | INTRAMUSCULAR | Status: AC
  Administered 2015-10-31: 0.4 mg via INTRAVENOUS

## 2015-10-31 MED ORDER — ONDANSETRON HCL 4 MG/2ML IJ SOLN: 4.0000 mg | Freq: Four times a day (QID) | INTRAMUSCULAR | Status: DC | PRN

## 2015-10-31 MED ORDER — INSULIN ASPART 100 UNIT/ML ~~LOC~~ SOLN
0.0000 [IU] | Freq: Three times a day (TID) | SUBCUTANEOUS | Status: DC
  Administered 2015-10-31: 11 [IU] via SUBCUTANEOUS
  Administered 2015-11-01: 8 [IU] via SUBCUTANEOUS
  Filled 2015-10-31: qty 1

## 2015-10-31 MED ORDER — ALUM & MAG HYDROXIDE-SIMETH 200-200-20 MG/5ML PO SUSP: 30.0000 mL | Freq: Four times a day (QID) | ORAL | Status: DC | PRN

## 2015-10-31 MED ORDER — ADULT MULTIVITAMIN W/MINERALS CH: 1.0000 | ORAL_TABLET | Freq: Every day | ORAL | Status: DC

## 2015-10-31 MED ORDER — NALOXONE HCL 0.4 MG/ML IJ SOLN
0.4000 mg | Freq: Once | INTRAMUSCULAR | Status: DC
  Filled 2015-10-31: qty 1

## 2015-10-31 MED ORDER — FOLIC ACID 1 MG PO TABS: 1.0000 mg | ORAL_TABLET | Freq: Every day | ORAL | Status: DC

## 2015-10-31 MED ORDER — ACETAMINOPHEN 650 MG RE SUPP: 650.0000 mg | Freq: Four times a day (QID) | RECTAL | Status: DC | PRN

## 2015-10-31 MED ORDER — DILTIAZEM HCL ER 60 MG PO CP12
120.0000 mg | ORAL_CAPSULE | Freq: Two times a day (BID) | ORAL | Status: DC
  Administered 2015-10-31: 120 mg via ORAL
  Filled 2015-10-31 (×3): qty 2

## 2015-10-31 MED ORDER — NALOXONE HCL 2 MG/2ML IJ SOSY: 2.0000 mg | PREFILLED_SYRINGE | Freq: Once | INTRAMUSCULAR | Status: DC

## 2015-10-31 MED ORDER — ONDANSETRON HCL 4 MG PO TABS: 4.0000 mg | ORAL_TABLET | Freq: Four times a day (QID) | ORAL | Status: DC | PRN

## 2015-10-31 MED ORDER — VITAMIN B-1 100 MG PO TABS: 100.0000 mg | ORAL_TABLET | Freq: Every day | ORAL | Status: DC

## 2015-10-31 MED ORDER — INSULIN ASPART 100 UNIT/ML ~~LOC~~ SOLN
10.0000 [IU] | Freq: Once | SUBCUTANEOUS | Status: AC
  Administered 2015-10-31: 10 [IU] via SUBCUTANEOUS

## 2015-10-31 MED ORDER — SODIUM CHLORIDE 0.9 % IJ SOLN
3.0000 mL | Freq: Two times a day (BID) | INTRAMUSCULAR | Status: DC
  Administered 2015-10-31: 3 mL via INTRAVENOUS

## 2015-10-31 MED ORDER — VITAMIN B-1 100 MG PO TABS
100.0000 mg | ORAL_TABLET | Freq: Every day | ORAL | Status: DC
  Administered 2015-10-31: 100 mg via ORAL
  Filled 2015-10-31: qty 1

## 2015-10-31 NOTE — ED Notes (Signed)
Patient finally would place the gown on but stated "I'm not taking my clothes off because that's what they want you to do. The [employer] has control of y'all and it's because I'm black that y'all treating me this way."; asked patient if I could check his blood sugar so that way the doctor would be able to treat him and patient stated " I said y'all aint doing shit for me. Just give me something to eat."; informed Thayer Ohmhris, [place] and Clydene PughKnott, MD

## 2015-10-31 NOTE — ED Notes (Signed)
Pt is refusing all care after multiple attempts to discuss his options

## 2015-10-31 NOTE — ED Provider Notes (Signed)
CSN: [id number]     Arrival date & time 10/31/15  1156 History   First MD Initiated Contact with Patient 10/31/15 1201     Chief Complaint  Patient presents with  . Altered Mental Status     (Consider location/radiation/quality/duration/timing/severity/associated sxs/prior Treatment) Patient is a 64 y.o. male presenting with altered mental status. The history is provided by the patient.  Altered Mental Status Presenting symptoms: unresponsiveness   Severity:  Severe Most recent episode:  Today Episode history:  Single Duration:  30 minutes Timing:  Constant Progression:  Unchanged Chronicity:  New Context: homelessness   Associated symptoms: normal movement, no bladder incontinence, no fever, no seizures, no suicidal behavior and no vomiting     Past Medical History  Diagnosis Date  . Hypertension   . Diabetes mellitus   . Homelessness   . Periodontal disease   . Noncompliance with medications   . PTSD (post-traumatic stress disorder)   . Alcohol abuse, unspecified    No past surgical history on file. Family History  Problem Relation Age of Onset  . Diabetes Mellitus II Mother   . Diabetes Mellitus II Father   . Stroke Maternal Grandfather    Social History  Substance Use Topics  . Smoking status: Current Every Day Smoker -- 0.50 packs/day    Types: Cigarettes  . Smokeless tobacco: Not on file  . Alcohol Use: Yes    Review of Systems  Constitutional: Negative for fever.  Gastrointestinal: Negative for vomiting.  Genitourinary: Negative for bladder incontinence.  Neurological: Negative for seizures.  All other systems reviewed and are negative.     Allergies  Latex  Home Medications   Prior to Admission medications   Not on File   BP 114/75 mmHg  Pulse 87  Temp(Src) 97 F (36.1 C) (Axillary)  Resp 22  Ht 6' (1.829 m)  Wt 175 lb (79.379 kg)  BMI 23.73 kg/m2  SpO2 99% Physical Exam  Constitutional: He is oriented to person, place, and time.  He appears well-developed and well-nourished. No distress.  HENT:  Head: Normocephalic and atraumatic.  Eyes: Conjunctivae are normal.  Neck: Neck supple. No tracheal deviation present.  Cardiovascular: Normal rate and regular rhythm.   Pulmonary/Chest: Effort normal. No respiratory distress.  Abdominal: Soft. He exhibits no distension.  Neurological: He is alert and oriented to person, place, and time. No cranial nerve deficit. GCS eye subscore is 2. GCS verbal subscore is 3. GCS motor subscore is 5.  Pinpoint pupils bilaterally  Skin: Skin is warm and dry.  Psychiatric: He has a normal mood and affect.    ED Course  Procedures (including critical care time) Labs Review Labs Reviewed  CBC WITH DIFFERENTIAL/PLATELET - Abnormal; Notable for the following:    RBC 3.80 (*)    Hemoglobin 11.4 (*)    HCT 33.7 (*)    All other components within normal limits  COMPREHENSIVE METABOLIC PANEL - Abnormal; Notable for the following:    Potassium 6.1 (*)    Glucose, Bld 192 (*)    BUN 26 (*)    Creatinine, Ser 1.79 (*)    Total Protein 5.4 (*)    Albumin 2.7 (*)    AST 68 (*)    Alkaline Phosphatase 128 (*)    GFR calc non Af Amer 39 (*)    GFR calc Af Amer 45 (*)    All other components within normal limits  URINALYSIS, ROUTINE W REFLEX MICROSCOPIC (NOT AT [facility]) - Abnormal; Notable for the  following:    Glucose, UA >1000 (*)    Hgb urine dipstick MODERATE (*)    Protein, ur >300 (*)    All other components within normal limits  URINE RAPID DRUG SCREEN, HOSP PERFORMED - Abnormal; Notable for the following:    Cocaine POSITIVE (*)    All other components within normal limits  URINE MICROSCOPIC-ADD ON - Abnormal; Notable for the following:    Squamous Epithelial / LPF 0-5 (*)    Bacteria, UA RARE (*)    Casts HYALINE CASTS (*)    All other components within normal limits  BASIC METABOLIC PANEL - Abnormal; Notable for the following:    Sodium 131 (*)    Chloride 97 (*)    Glucose,  Bld 360 (*)    BUN 32 (*)    Creatinine, Ser 1.72 (*)    Calcium 8.3 (*)    GFR calc non Af Amer 41 (*)    GFR calc Af Amer 47 (*)    All other components within normal limits  CBC - Abnormal; Notable for the following:    WBC 3.9 (*)    RBC 3.01 (*)    Hemoglobin 8.7 (*)    HCT 26.4 (*)    Platelets 140 (*)    All other components within normal limits  GLUCOSE, CAPILLARY - Abnormal; Notable for the following:    Glucose-Capillary 419 (*)    All other components within normal limits  GLUCOSE, CAPILLARY - Abnormal; Notable for the following:    Glucose-Capillary 319 (*)    All other components within normal limits  GLUCOSE, CAPILLARY - Abnormal; Notable for the following:    Glucose-Capillary 297 (*)    All other components within normal limits  CBG MONITORING, ED - Abnormal; Notable for the following:    Glucose-Capillary 212 (*)    All other components within normal limits  I-STAT CG4 LACTIC ACID, ED - Abnormal; Notable for the following:    Lactic Acid, Venous 2.69 (*)    All other components within normal limits  CBG MONITORING, ED - Abnormal; Notable for the following:    Glucose-Capillary 339 (*)    All other components within normal limits  URINE CULTURE  ETHANOL  POTASSIUM  LACTIC ACID, PLASMA  I-STAT CG4 LACTIC ACID, ED    Imaging Review Dg Chest 2 View  10/31/2015  CLINICAL DATA:  Unresponsive patient, found down at bus stop. Syncope. Diabetes and hypertension. EXAM: CHEST  2 VIEW COMPARISON:  10/08/2015 FINDINGS: Tortuous thoracic aorta. Heart size within normal limits for projection. The lungs appear clear.  No pleural effusion identified. Mild thoracic spondylosis. IMPRESSION: 1. Mildly tortuous thoracic aorta. No acute findings or specific explanation for the patient's syncopal episode. Electronically Signed   By: Gaylyn Rong M.D.   On: 10/31/2015 16:00   Ct Head Wo Contrast  10/31/2015  CLINICAL DATA:  Elevated blood glucose with altered mental  status EXAM: CT HEAD WITHOUT CONTRAST TECHNIQUE: Contiguous axial images were obtained from the base of the skull through the vertex without intravenous contrast. COMPARISON:  08/16/2015 FINDINGS: Bony calvarium is intact. The ethmoid air cells demonstrate some mucosal changes similar to that noted on the prior exam. The previously seen changes in the maxillary antra are not appreciated on this study. Mild atrophic changes are seen. No findings to suggest acute hemorrhage, acute infarction or space-occupying mass lesion are noted. Mild chronic white matter ischemic changes noted. IMPRESSION: Chronic atrophic and ischemic changes without acute abnormality. Electronically Signed  By: Alcide CleverMark  Lukens M.D.   On: 10/31/2015 13:28   I have personally reviewed and evaluated these images and lab results as part of my medical decision-making.   EKG Interpretation   Date/Time:  Tuesday October 31 2015 12:08:00 EST Ventricular Rate:  78 PR Interval:  140 QRS Duration: 147 QT Interval:  366 QTC Calculation: 417 R Axis:   93 Text Interpretation:  Sinus rhythm Right bundle branch block Baseline  wander in lead(s) V1 V2 No significant change since last tracing Confirmed  by Atiya Yera MD, Reuel BoomANIEL ([id number]) on 10/31/2015 12:23:32 PM      MDM   Final diagnoses:  Syncope    64 y.o. male presents with unresponsive episode after recently signing out AMA from this facility. Pt found at a bus stop and brought in only responsive to pain and disoriented. Given 0.4 mg narcan with improvement in mental status and 2 mg narcan with vast improvement of mental status. CT negative, no acute metabolic explanation of symptoms. Suspect ingestion/drug use but Pt denying currently. Agrees to observation for possible recurrence given unclear etiology. Hospitalist was consulted for admission and will see the patient in the emergency department.     Lyndal Pulleyaniel Jewel Mcafee, MD 11/01/15 [phone]

## 2015-10-31 NOTE — ED Notes (Signed)
Patient found by security on floor at the bus stop. Upon arrival patient unresponsive responds to pain airway intact bilateral equal chest rise and fall.

## 2015-10-31 NOTE — ED Notes (Signed)
Critical lab potasium 6.1 hemolyzed Doctor notified.

## 2015-10-31 NOTE — ED Provider Notes (Signed)
CSN: [id number]     Arrival date & time 10/31/15  1020 History   First MD Initiated Contact with Patient 10/31/15 1036     Chief Complaint  Patient presents with  . Hyperglycemia     (Consider location/radiation/quality/duration/timing/severity/associated sxs/prior Treatment) HPI Patient presents to the emergency department with elevated blood sugar.  The patient was brought in by someone from a facility where it helps homeless people with medical care is his blood sugar is elevated.  The patient is very angry and will not give me any history.  Says he did not want to be here.  He states that his blood sugars always up and he never checks that the patient states that he does not have any complaints this time.  The patient is not happy he was brought here and rude towards the staff because he does not want to be here.  The patient does answer my questions, that he will answer appropriately.  Past Medical History  Diagnosis Date  . Hypertension   . Diabetes mellitus   . Homelessness   . Periodontal disease   . Noncompliance with medications   . PTSD (post-traumatic stress disorder)   . Alcohol abuse, unspecified    History reviewed. No pertinent past surgical history. Family History  Problem Relation Age of Onset  . Diabetes Mellitus II Mother   . Diabetes Mellitus II Father   . Stroke Maternal Grandfather    Social History  Substance Use Topics  . Smoking status: Current Every Day Smoker -- 0.50 packs/day    Types: Cigarettes  . Smokeless tobacco: None  . Alcohol Use: Yes    Review of Systems  Level V caveat applies due to uncooperativeness  Allergies  Latex  Home Medications   Prior to Admission medications   Not on File   BP 163/101 mmHg  Pulse 83  Temp(Src) 98 F (36.7 C) (Oral)  Resp 18  SpO2 100% Physical Exam  Constitutional: He is oriented to person, place, and time. He appears well-developed and well-nourished.  HENT:  Head: Normocephalic and  atraumatic.  Mouth/Throat: Oropharynx is clear and moist.  Eyes: Pupils are equal, round, and reactive to light.  Neck: Normal range of motion. Neck supple.  Cardiovascular: Normal rate, regular rhythm and normal heart sounds.  Exam reveals no gallop and no friction rub.   No murmur heard. Pulmonary/Chest: Effort normal and breath sounds normal. No respiratory distress.  Neurological: He is alert and oriented to person, place, and time. He has normal strength. He displays no atrophy. No sensory deficit. He exhibits normal muscle tone. Coordination and gait normal. GCS eye subscore is 4. GCS verbal subscore is 5. GCS motor subscore is 6.  Skin: Skin is warm and dry.  Nursing note and vitals reviewed.   ED Course  Procedures (including critical care time)  I have personally reviewed and evaluated these images and lab results as part of my medical decision-making.  Patient is requesting to leave.  He does not want to be here any longer.  He does not want any blood testing.  The patient states he knows that his blood sugars high states he never checks it at home.  He states that he does not want anybody here to check it.  He is within capability of refusing care.  He is mentating appropriately.  The patient also has been aggressive towards staff because they are trying to do his vital signs and obtain blood testing.  The patient would like to  be discharged home.  The patient is advised to return here for any worsening in his condition.  I did advise him that he understands that if he leaves, he could have a worsening in his condition.  He says he does not care.  Patient ambulated without difficulty walking out of the room    Charlestine NightChristopher Malashia Kamaka, PA-C 11/01/15 2335  Lyndal Pulleyaniel Knott, MD 11/02/15 2208

## 2015-10-31 NOTE — ED Notes (Signed)
Patient comes from Primary Care provider states CBG read high was sent to ER. Patient states he does not check sugars at home and is not compliant with home medications. Patient Alert and orintied x4 on arrival able to move all extremities.

## 2015-10-31 NOTE — ED Notes (Signed)
Checked patient blood sugar it was 339 notified RN Toniann FailWendy of blood sugar
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
## 2015-10-31 NOTE — ED Notes (Signed)
Spoke with Dr Clydene PughKnott states patient responded with first does of narcan and to give a second dose narcan 2mg  IVP.
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
## 2015-10-31 NOTE — ED Notes (Signed)
Patient being aggressive towards staff not wanting to cooperate with taking his clothes off or answering the RN's question for screening; visitor with patient is not related and gave patient a ride to the hospital from the doctor's office

## 2015-10-31 NOTE — ED Notes (Signed)
Attempted to call report

## 2015-11-01 LAB — URINE CULTURE: Culture: 2000

## 2015-11-01 LAB — CBC
HCT: 26.4 % — ABNORMAL LOW (ref 39.0–52.0)
HEMOGLOBIN: 8.7 g/dL — AB (ref 13.0–17.0)
MCH: 28.9 pg (ref 26.0–34.0)
MCHC: 33 g/dL (ref 30.0–36.0)
MCV: 87.7 fL (ref 78.0–100.0)
PLATELETS: 140 10*3/uL — AB (ref 150–400)
RBC: 3.01 MIL/uL — AB (ref 4.22–5.81)
RDW: 13.2 % (ref 11.5–15.5)
WBC: 3.9 10*3/uL — ABNORMAL LOW (ref 4.0–10.5)

## 2015-11-01 LAB — BASIC METABOLIC PANEL
ANION GAP: 5 (ref 5–15)
BUN: 32 mg/dL — ABNORMAL HIGH (ref 6–20)
CALCIUM: 8.3 mg/dL — AB (ref 8.9–10.3)
CO2: 29 mmol/L (ref 22–32)
CREATININE: 1.72 mg/dL — AB (ref 0.61–1.24)
Chloride: 97 mmol/L — ABNORMAL LOW (ref 101–111)
GFR, EST AFRICAN AMERICAN: 47 mL/min — AB (ref >60)
GFR, EST NON AFRICAN AMERICAN: 41 mL/min — AB (ref >60)
Glucose, Bld: 360 mg/dL — ABNORMAL HIGH (ref 65–99)
Potassium: 3.7 mmol/L (ref 3.5–5.1)
SODIUM: 131 mmol/L — AB (ref 135–145)

## 2015-11-01 LAB — GLUCOSE, CAPILLARY
GLUCOSE-CAPILLARY: 319 mg/dL — AB (ref 65–99)
Glucose-Capillary: 297 mg/dL — ABNORMAL HIGH (ref 65–99)

## 2015-11-01 LAB — LACTIC ACID, PLASMA: LACTIC ACID, VENOUS: 0.9 mmol/L (ref 0.5–2.0)

## 2015-11-01 MED ORDER — GLYBURIDE 5 MG PO TABS: 10.0000 mg | ORAL_TABLET | Freq: Two times a day (BID) | ORAL | Status: DC

## 2015-11-01 MED ORDER — FOLIC ACID 1 MG PO TABS: 1.0000 mg | ORAL_TABLET | Freq: Every day | ORAL | Status: DC

## 2015-11-01 MED ORDER — DILTIAZEM HCL ER 120 MG PO CP12: 120.0000 mg | ORAL_CAPSULE | Freq: Two times a day (BID) | ORAL | Status: DC

## 2015-11-01 MED ORDER — GLYBURIDE 5 MG PO TABS
10.0000 mg | ORAL_TABLET | Freq: Two times a day (BID) | ORAL | Status: DC
  Filled 2015-11-01 (×3): qty 2

## 2015-11-01 MED ORDER — SODIUM CHLORIDE 0.9 % IV SOLN: INTRAVENOUS | Status: DC

## 2015-11-01 NOTE — Progress Notes (Signed)
Inpatient Diabetes Program Recommendations  AACE/ADA: New Consensus Statement on Inpatient Glycemic Control (2015)  Target Ranges:  Prepandial:   less than 140 mg/dL      Peak postprandial:   less than 180 mg/dL (1-2 hours)      Critically ill patients:  140 - 180 mg/dL   DM Coordinator Consult for Noncompliance  Review of Glycemic Control  Diabetes history: DM 2 Outpatient Diabetes medications: None listed in med rec Current orders for Inpatient glycemic control: Novolog Moderate + HS, Glyburide 10 mg BID  Inpatient Diabetes Program Recommendations:   Patient is very familiar with our team. Patient was seen on 10/09/2015 for the A1c of 14.7%. Patient was spoken to at length about medication and lifestyle compliance at that time. Please refer to note. Patient reports refusing to take insulin and will only take oral medications. No needs at this time. Noted Glyburide increased today. Will follow while inpatient.   Thanks,  Christena DeemShannon Rajinder Mesick RN, MSN, [facility] Inpatient Diabetes Coordinator Team Pager [phone] (8a-5p)

## 2015-11-01 NOTE — Progress Notes (Signed)
Pt removed telemetry.  Walked to patient's room and was told by nurse tech Boneta LucksJenny that pt wanted to leave.  Informed Dr. Susie CassetteAbrol.  Pt unable to wait for physician to see pt.  Pt signed AMA form.  IV removed.  Will continue to monitor.
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
## 2015-11-23 ENCOUNTER — Encounter (HOSPITAL_COMMUNITY): Payer: Self-pay | Admitting: *Deleted

## 2015-11-23 ENCOUNTER — Emergency Department (HOSPITAL_COMMUNITY)
Admission: EM | Admit: 2015-11-23 | Discharge: 2015-11-24 | Disposition: A | Discharge status: Home / Self Care | Payer: Medicaid Other | Attending: Emergency Medicine | Admitting: Emergency Medicine

## 2015-11-23 DIAGNOSIS — Z5900 Homelessness unspecified: Secondary | ICD-10-CM

## 2015-11-23 DIAGNOSIS — Z8719 Personal history of other diseases of the digestive system: Secondary | ICD-10-CM | POA: Insufficient documentation

## 2015-11-23 DIAGNOSIS — E119 Type 2 diabetes mellitus without complications: Secondary | ICD-10-CM | POA: Diagnosis not present

## 2015-11-23 DIAGNOSIS — Z91040 Latex allergy status: Secondary | ICD-10-CM | POA: Diagnosis not present

## 2015-11-23 DIAGNOSIS — R200 Anesthesia of skin: Secondary | ICD-10-CM | POA: Diagnosis not present

## 2015-11-23 DIAGNOSIS — I160 Hypertensive urgency: Secondary | ICD-10-CM | POA: Insufficient documentation

## 2015-11-23 DIAGNOSIS — Z8659 Personal history of other mental and behavioral disorders: Secondary | ICD-10-CM | POA: Insufficient documentation

## 2015-11-23 DIAGNOSIS — F17210 Nicotine dependence, cigarettes, uncomplicated: Secondary | ICD-10-CM | POA: Diagnosis not present

## 2015-11-23 DIAGNOSIS — R202 Paresthesia of skin: Secondary | ICD-10-CM | POA: Insufficient documentation

## 2015-11-23 DIAGNOSIS — I10 Essential (primary) hypertension: Secondary | ICD-10-CM | POA: Diagnosis present

## 2015-11-23 LAB — CBG MONITORING, ED: GLUCOSE-CAPILLARY: 390 mg/dL — AB (ref 65–99)

## 2015-11-23 NOTE — ED Notes (Signed)
Bed: WA09 Expected date:  Expected time:  Means of arrival:  Comments: EMS CBG elevated/hypertension noncompliant with meds

## 2015-11-23 NOTE — ED Notes (Signed)
Per EMS report: pt c/o of diabetes problems.  Pt is homeless and noncompliant with medications. Pt reports having tingling fingers and blurry vision.  EMS's CBG was 430 and pt's BP was 220/120. Pt denies with EMS. Pt a/o x4 and ambulatory. NAD noted.

## 2015-11-23 NOTE — ED Notes (Signed)
Pt. CBG 390, RN,Merle made aware.

## 2015-11-24 LAB — BASIC METABOLIC PANEL
Anion gap: 8 (ref 5–15)
BUN: 31 mg/dL — AB (ref 6–20)
CHLORIDE: 104 mmol/L (ref 101–111)
CO2: 26 mmol/L (ref 22–32)
Calcium: 8.9 mg/dL (ref 8.9–10.3)
Creatinine, Ser: 1.64 mg/dL — ABNORMAL HIGH (ref 0.61–1.24)
GFR calc Af Amer: 50 mL/min — ABNORMAL LOW (ref >60)
GFR calc non Af Amer: 43 mL/min — ABNORMAL LOW (ref >60)
GLUCOSE: 412 mg/dL — AB (ref 65–99)
POTASSIUM: 4.2 mmol/L (ref 3.5–5.1)
SODIUM: 138 mmol/L (ref 135–145)

## 2015-11-24 LAB — CBC
HEMATOCRIT: 31.4 % — AB (ref 39.0–52.0)
Hemoglobin: 10.3 g/dL — ABNORMAL LOW (ref 13.0–17.0)
MCH: 30 pg (ref 26.0–34.0)
MCHC: 32.8 g/dL (ref 30.0–36.0)
MCV: 91.5 fL (ref 78.0–100.0)
Platelets: 172 10*3/uL (ref 150–400)
RBC: 3.43 MIL/uL — ABNORMAL LOW (ref 4.22–5.81)
RDW: 13.8 % (ref 11.5–15.5)
WBC: 4.8 10*3/uL (ref 4.0–10.5)

## 2015-11-24 MED ORDER — GLYBURIDE 5 MG PO TABS
10.0000 mg | ORAL_TABLET | Freq: Once | ORAL | Status: AC
  Administered 2015-11-24: 10 mg via ORAL
  Filled 2015-11-24: qty 2

## 2015-11-24 MED ORDER — GLYBURIDE 5 MG PO TABS: 10.0000 mg | ORAL_TABLET | Freq: Two times a day (BID) | ORAL | Status: DC

## 2015-11-24 MED ORDER — DILTIAZEM HCL 60 MG PO TABS
120.0000 mg | ORAL_TABLET | Freq: Once | ORAL | Status: AC
  Administered 2015-11-24: 120 mg via ORAL
  Filled 2015-11-24: qty 2

## 2015-11-24 MED ORDER — DILTIAZEM HCL ER 120 MG PO CP12: 120.0000 mg | ORAL_CAPSULE | Freq: Two times a day (BID) | ORAL | Status: DC

## 2015-11-24 NOTE — ED Provider Notes (Signed)
CSN: [id number]     Arrival date & time 11/23/15  2336 History  By signing my name below, I, Emmanuella Mensah, attest that this documentation has been prepared under the direction and in the presence of Derwood KaplanAnkit Demya Scruggs, MD. Electronically Signed: Angelene GiovanniEmmanuella Mensah, ED Scribe. 11/24/2015. 1:33 AM.    Chief Complaint  Patient presents with  . Hyperglycemia  . Hypertension   The history is provided by the patient. No language interpreter was used.   HPI Comments: Clayton [name] is a 64 y.o. male with a hx of DM and HTN who presents to the Emergency Department by EMS complaining of ongoing hyperglycemia and HTN onset yesterday. He reports associated tingling in his bilateral hands. He reports that he currently takes 40 units of insulin for his DM but he has ran out of his prescription. He denies taking his HTN medications as well. He is vague about insulin - i read the last diabetes coordinator note, and they stated that pt refused insuln, and therefore his glyburide was increased. Pt states that the note is a lie, but he is not able to tell me when he last took insulin and how many units (20 units, 40 units?).  Pt also explains that he was asked to leave the house he was living in for one year on [address] at approx. 7:30 pm yesterday and was asked to to go [address] but he does not want to live there because there is no food there and the other person was watching some cartoon all day. He denies that his initial house is a group home, stating that the person asking him to leave is a Child psychotherapistsocial worker who owns the home. He states that he left the house and was sleeping on the carpet at a friend's house but was uncomfortable and so he left to try to go back to his initial house but no one would open the door. Since he did not open the door, the pt went to the bus station where he asked them to call EMS.  Pt wants us to figure out his living situation. He has a Child psychotherapistsocial worker assigned to  him and gives me the name of both the main SW and assistant "payee" who have been monitoring his housing. When asked if he wold have been here if he had a warm place to sleep on and had food - he effectively said no.  BP is elevated. Pt has no chest pain, headache, bilateral numbness in his hands, but no other focal weakness, numbness.   No PCP.    Past Medical History  Diagnosis Date  . Hypertension   . Diabetes mellitus   . Homelessness   . Periodontal disease   . Noncompliance with medications   . PTSD (post-traumatic stress disorder)   . Alcohol abuse, unspecified    History reviewed. No pertinent past surgical history. Family History  Problem Relation Age of Onset  . Diabetes Mellitus II Mother   . Diabetes Mellitus II Father   . Stroke Maternal Grandfather    Social History  Substance Use Topics  . Smoking status: Current Every Day Smoker -- 0.50 packs/day    Types: Cigarettes  . Smokeless tobacco: None  . Alcohol Use: Yes    Review of Systems  Constitutional: Negative for fever.  Eyes: Negative for visual disturbance.  Cardiovascular: Negative for chest pain.  Neurological: Positive for numbness. Negative for dizziness, facial asymmetry and weakness.  Tingling in bilateral hands  Psychiatric/Behavioral: Negative for self-injury.   A complete 10 system review of systems was obtained and all systems are negative except as noted in the HPI and PMH.     Allergies  Latex  Home Medications   Prior to Admission medications   Medication Sig Start Date End Date Taking? Authorizing Provider  diltiazem (CARDIZEM SR) 120 MG 12 hr capsule Take 1 capsule (120 mg total) by mouth every 12 (twelve) hours. 11/24/15   Derwood Kaplan, MD  folic acid (FOLVITE) 1 MG tablet Take 1 tablet (1 mg total) by mouth daily. Patient not taking: Reported on 11/24/2015 11/01/15   Richarda Overlie, MD  glyBURIDE (DIABETA) 5 MG tablet Take 2 tablets (10 mg total) by mouth 2 (two) times daily  with a meal. 11/24/15   Jamaica Inthavong, MD   BP 200/114 mmHg  Pulse 92  Temp(Src) 97.7 F (36.5 C) (Oral)  Resp 18  SpO2 100% Physical Exam  Constitutional: He is oriented to person, place, and time. He appears well-developed and well-nourished. No distress.  HENT:  Head: Normocephalic and atraumatic.  Eyes: Conjunctivae and EOM are normal.  Neck: Neck supple. No tracheal deviation present.  Cardiovascular: Normal rate.   Pulmonary/Chest: Effort normal. No respiratory distress.  Musculoskeletal: Normal range of motion.  Neurological: He is alert and oriented to person, place, and time.  Skin: Skin is warm and dry.  Psychiatric: He has a normal mood and affect. His behavior is normal.  Nursing note and vitals reviewed.   ED Course  Procedures (including critical care time) DIAGNOSTIC STUDIES: Oxygen Saturation is 100% on RA, normal by my interpretation.    COORDINATION OF CARE: 1:32 AM- Pt advised of plan for treatment and pt agrees.    Labs Review Labs Reviewed  BASIC METABOLIC PANEL - Abnormal; Notable for the following:    Glucose, Bld 412 (*)    BUN 31 (*)    Creatinine, Ser 1.64 (*)    GFR calc non Af Amer 43 (*)    GFR calc Af Amer 50 (*)    All other components within normal limits  CBC - Abnormal; Notable for the following:    RBC 3.43 (*)    Hemoglobin 10.3 (*)    HCT 31.4 (*)    All other components within normal limits  CBG MONITORING, ED - Abnormal; Notable for the following:    Glucose-Capillary 390 (*)    All other components within normal limits  URINALYSIS, ROUTINE W REFLEX MICROSCOPIC (NOT AT [facility])    Derwood Kaplan, MD has personally reviewed and evaluated these lab results as part of his medical decision-making.   MDM   Final diagnoses:  Homelessness  Hypertensive urgency    I personally performed the services described in this documentation, which was scribed in my presence. The recorded information has been reviewed and is  accurate.  Pt comes here for elevated blood sugar, BP and homelessness.  Elevated Blood sugar: He has no DKA. He is not specific on his diabetes regimen - but the notes indicate that he has poorly controlled sugars with A1C as high as 14.7. He also has had non compliance issues and his last DM coordinator note states that pt wasn't wanting insulin. He wants prescription for his meds, states that he might have left them at [facility] place -we will give him glyburide. With no gap, we will not treat the glucose aggressively in the ER parenterally, as research has shown no long term  benefit for treating a number.  Elevated BP: Pt has no chest pain, dib, headache. He has bilateral hand numbness -but that is more likely to be neuropathic etiology than end organ damage. Will give him his home meds - he admits to not taking his BP meds. No aggressive ER management needed based on ACEP guidelines on asymptomatic HTN.  Homelessness: Pt has a SW assigned, he knows his name and number. He also knows the assistant who switched his hime situation. Pt is truly not homeless, he states that he was moved to a place [address], which he doesn't like for various reasons (no food, cartoon playing in the house, not as warm etc.). I will give him the shelter resources. He will wait outside until 5 am, as long as he behaves (he has name called RN, tech and even me thus far -but it is 20 F outside, and i dont want him to get frostbite). Bus pass given. Good given. GHe has been asked to see his SW about his living situation.   Derwood Kaplan, MD 11/24/15 [phone]

## 2015-11-24 NOTE — ED Notes (Signed)
Pt requesting food and milk.  Pt was informed that he needed to wait until a provider sees him and is ok to give the pt food.

## 2015-11-24 NOTE — ED Notes (Signed)
Pt was seen going through the cabinets and taking sheets and pillow case.  It was explained to the pt that he cannot take hospital property.  Pt began using aggressive language.

## 2015-11-24 NOTE — ED Notes (Signed)
Pt. Made aware for the need of urine. 

## 2015-11-24 NOTE — Discharge Instructions (Signed)
Take the medicine prescribed. Follow up with your doctor who prescribes you medication as soon as possible, or use [facility] OR ANY OF THE RESOURCES BELOW.  Shelter list has been provided in case you need them - but speak with your Social Worker to see if they can put you in a setting you like, otherwise go back to [address] home.   Emergency Department Resource Guide 1) Find a Doctor and Pay Out of Pocket Although you won't have to find out who is covered by your insurance plan, it is a good idea to ask around and get recommendations. You will then need to call the office and see if the doctor you have chosen will accept you as a new patient and what types of options they offer for patients who are self-pay. Some doctors offer discounts or will set up payment plans for their patients who do not have insurance, but you will need to ask so you aren't surprised when you get to your appointment.  2) Contact Your Local Health Department Not all health departments have doctors that can see patients for sick visits, but many do, so it is worth a call to see if yours does. If you don't know where your local health department is, you can check in your phone book. The CDC also has a tool to help you locate your state's health department, and many state websites also have listings of all of their local health departments.  3) Find a Walk-in Clinic If your illness is not likely to be very severe or complicated, you may want to try a walk in clinic. These are popping up all over the country in pharmacies, drugstores, and shopping centers. They're usually staffed by nurse practitioners or physician assistants that have been trained to treat common illnesses and complaints. They're usually fairly quick and inexpensive. However, if you have serious medical issues or chronic medical problems, these are probably not your best option.  No Primary Care Doctor: - Call Health Connect at  [phone] - they can help you  locate a primary care doctor that  accepts your insurance, provides certain services, etc. - Physician Referral Service- [phone]  Chronic Pain Problems: Organization         Address  Phone   Notes  [facility]  [phone] Patients need to be referred by their primary care doctor.   Medication Assistance: Organization         Address  Phone   Notes  [facility] [address] [place], [place] [address] [phone] --Must be a resident of [place] -- Must have NO insurance coverage whatsoever (no Medicaid/ Medicare, etc.) -- The pt. MUST have a primary care doctor that directs their care regularly and follows them in the community   [facility]ist  [phone]   [employer]  [phone]    Agencies that provide inexpensive medical care: Organization         Address  Phone   Notes  [facility]  [phone]   [facility]    [phone]   [facility] [address] [place], [place] [address] [phone]   [facility] [address], [place] [phone]   [facility]    [phone]   [facility]    [phone]   [facility]  [address],  [place] Phone:  [phone], Fax:  [phone] Hours of Operation:  9 am - 6 pm, M-F.  Also accepts Medicaid/Medicare and self-pay.  [facility]  [address], [place] Phone: [phone], Fax: [phone]. Hours of Operation:  8:30 am - 5:30 pm, M-F.  Also accepts Medicaid and self-pay.  [facility] [place] [address], [place] Phone: [phone]   [facility] [address] Natasha Bence [place], [place] [phone], Ext. [phone] Mondays & Thursdays: 7-9 AM.  First 15 patients are seen on a first come, first serve basis.    Medicaid-accepting [facility]  Providers:  Organization         Address  Phone   Notes  [facility] [address], [place] [phone] Also accepts self-pay patients.  [facility] [address] Laurell Josephs [place], [place]  [phone]   [facility] [address], [place] [phone]   [facility] [address], [place] [phone]   Renaye Rakers [address], [place]   [phone] Only accepts [place] patients after they have their name applied to their card.   Self-Pay (no insurance) in [facility]:  Organization         Address  Phone   Notes  Sickle Cell Patients, [facility] [address] [place], [place] [phone]   [facility] [address] [place], [place] [phone]   [facility]  [address], [place] [phone]   [facility]/Dr. Osei-Bonsu  [address], [place] or [address], [place] [phone] Phone number for both [place] and [place] locations is the same.  [facility] [address], [place] [phone]   [facility] [address], [place] or [address] [phone]   [facility] [address] [place] [phone], phone; [phone], fax Sees patients 1st and 3rd Saturday of every month.  Must not qualify for public or private insurance (i.e. Medicaid, Medicare, [place] Health Choice, Veterans' Benefits)  Household income should be no more than 200% of the poverty level The clinic cannot treat you if you are pregnant or think you are pregnant  Sexually transmitted diseases are not treated at the clinic.    Dental Care: Organization         Address  Phone  Notes  [facility] of [facility] [address] [place], [place] [phone] Accepts children up to age 20 who are enrolled in [place] or [place] Health Choice; pregnant women with a Medicaid card; and children who have applied for Medicaid or [place] Health Choice, but were declined, whose parents can pay a reduced fee at time of service.  [facility]  [address], [place] [phone] Accepts children up to age 71 who are enrolled in [place] or [place] Health Choice; pregnant women with a Medicaid card; and children who have applied for Medicaid or [place] Health Choice, but were declined, whose parents can pay a reduced fee at time of service.  [facility]  [address] [place], [place] [phone] Patients are seen by  appointment only. Walk-ins are not accepted. [facility] will see patients 1 years of age and older. Monday - Tuesday (8am-5pm) Most Wednesdays (8:30-5pm) $30 per visit, cash only  [facility] Adult Dental Access PROGRAM  [address], [facility] [phone] Patients are seen by appointment only. Walk-ins are not accepted. [facility] will see patients 23 years of age and older. One Wednesday Evening (Monthly: Volunteer Based).  $30 per visit, cash only  [employer]  [phone] for adults; Children under age 31, call Graduate Pediatric Dentistry at [phone]. Children aged 48-14, please call [phone] to request a pediatric application.  Dental services are provided in all areas of dental care including fillings, crowns and bridges, complete and partial dentures, implants, gum treatment, root canals, and extractions. Preventive care is also provided. Treatment is provided to both adults and children. Patients are selected via a lottery and there is often a waiting list.   [facility] [address] [place]  [phone] [email or web address]   [facility]  [address] [place], [place] [phone], Ext. [phone] Second and Fourth Thursday of each month, opens at 6:30 AM; Clinic ends at 9 AM.  Patients are seen on a first-come first-served basis, and a limited number are seen during each clinic.   [facility]  [address] Ether Griffins [place], [place] [phone]   Eligibility Requirements You must have lived in [place], [place], or [place] counties for at least the last three months.   You cannot be eligible for state or federal sponsored [place], including [employer], [place], or [employer].   You generally cannot be eligible for healthcare insurance through your employer.    How to apply: Eligibility screenings are held every Tuesday and Wednesday afternoon from 1:00 pm until 4:00 pm. You do not need an appointment for the interview!  [facility] [address] [place], [place] [phone]   [facility] Health Department  [phone]   [facility] Health Department  [phone]   [facility] Health Department  [phone]    Behavioral Health Resources in the Community: Intensive Outpatient Programs Organization         Address  Phone  Notes  [facility] Services [address], [place], [place] [phone]   [facility] Outpatient [address] [place], [place] [phone]   [facility] [address] [place], [place]  [phone]   [facility] [address],  [place], [place] [phone] or [phone]   Substance Abuse Resources Organization         Address  Phone  Notes  [facility] Drug Services  [phone]   [facility]  [phone]   [facility]  [phone]   Floydene Flock  [phone]   Residential & Outpatient Substance Abuse Program  [phone]   Psychological Services Organization         Address  Phone  Notes  [facility] Behavioral Health  [phone]     [facility]  [phone]   [facility] Mental Health [address], [place] [phone] or [phone]    Mobile Crisis Teams Organization         Address  Phone  Notes  [facility], Mobile Crisis Care Unit  [phone]   [facility]  [address] [place], [place] [phone]   [facility] [address] [place]  [phone]    Self-Help/Support Groups Organization         Address  Phone             Notes  [facility] [place] - variety of support groups  [phone]- [phone] Call for more information  Narcotics Anonymous (NA), [facility] [address], [employer] [place]  2 meetings at this location   Statistician         Address  Phone  Notes  [facility] [address],    [place]  [phone]   [facility]  [address], [place] [address], [place], [place] [phone]   [facility] [address] [place], [place] [phone] Admissions: 8am-3pm M-F  [facility] [address],    [place], [place] [phone]   [facility] [address] [place], [place], [place] [phone]   [facility] [address],  [place], [place] [phone]   [facility] - Intensive Outpatient [address] Laurell Josephs [address], [place], [place] [phone]   [facility] ([facility] [address] [place].,  [place], [place] [phone] or [phone]   [facility] ([facility]) [address] [place], [place] [phone] Accepts Medicaid  [facility] [address],  [place] [phone] Substance Abuse/Addiction Treatment   [facility] Organization         Address  Phone  Notes  [facility]  [phone]   Angie Fava, PhD [address] Ervin Knack [place], [place]   [phone] or [phone]    [facility]   [address] [place], [place] [phone]   [facility] [address], [place], [place] [phone] Insurance/Medicaid/sponsorship through [facility] and Families [address]                                    [place], [place] [phone] Therapy/tele-psych/case  [facility] [address], [place] [phone]    Dr. Lolly Mustache  [phone]   [facility]  United Way [facility] Dept. 1) [address], [place] 2) [address] [place] 3)  [address], [place] [phone]  [phone]   [facility] Child Abuse Hotline [phone] or [phone] (After Hours)

## 2015-12-04 ENCOUNTER — Encounter (HOSPITAL_COMMUNITY): Payer: Self-pay | Admitting: Emergency Medicine

## 2015-12-04 ENCOUNTER — Emergency Department (HOSPITAL_COMMUNITY): Payer: Medicaid Other

## 2015-12-04 ENCOUNTER — Inpatient Hospital Stay (HOSPITAL_COMMUNITY)
Admission: EM | Admit: 2015-12-04 | Discharge: 2015-12-05 | DRG: 638 | Disposition: A | Discharge status: Home / Self Care | Payer: Medicaid Other | Attending: Internal Medicine | Admitting: Internal Medicine

## 2015-12-04 DIAGNOSIS — D631 Anemia in chronic kidney disease: Secondary | ICD-10-CM | POA: Diagnosis present

## 2015-12-04 DIAGNOSIS — F17200 Nicotine dependence, unspecified, uncomplicated: Secondary | ICD-10-CM | POA: Diagnosis present

## 2015-12-04 DIAGNOSIS — R05 Cough: Secondary | ICD-10-CM | POA: Diagnosis not present

## 2015-12-04 DIAGNOSIS — Z9114 Patient's other noncompliance with medication regimen: Secondary | ICD-10-CM | POA: Diagnosis not present

## 2015-12-04 DIAGNOSIS — Z91040 Latex allergy status: Secondary | ICD-10-CM

## 2015-12-04 DIAGNOSIS — I5033 Acute on chronic diastolic (congestive) heart failure: Secondary | ICD-10-CM | POA: Diagnosis present

## 2015-12-04 DIAGNOSIS — E1122 Type 2 diabetes mellitus with diabetic chronic kidney disease: Secondary | ICD-10-CM | POA: Diagnosis present

## 2015-12-04 DIAGNOSIS — E1165 Type 2 diabetes mellitus with hyperglycemia: Principal | ICD-10-CM | POA: Diagnosis present

## 2015-12-04 DIAGNOSIS — N179 Acute kidney failure, unspecified: Secondary | ICD-10-CM | POA: Diagnosis present

## 2015-12-04 DIAGNOSIS — Z823 Family history of stroke: Secondary | ICD-10-CM

## 2015-12-04 DIAGNOSIS — I5032 Chronic diastolic (congestive) heart failure: Secondary | ICD-10-CM | POA: Diagnosis present

## 2015-12-04 DIAGNOSIS — H538 Other visual disturbances: Secondary | ICD-10-CM | POA: Diagnosis present

## 2015-12-04 DIAGNOSIS — J189 Pneumonia, unspecified organism: Secondary | ICD-10-CM

## 2015-12-04 DIAGNOSIS — I509 Heart failure, unspecified: Secondary | ICD-10-CM

## 2015-12-04 DIAGNOSIS — D649 Anemia, unspecified: Secondary | ICD-10-CM | POA: Diagnosis not present

## 2015-12-04 DIAGNOSIS — I160 Hypertensive urgency: Secondary | ICD-10-CM | POA: Diagnosis present

## 2015-12-04 DIAGNOSIS — F4310 Post-traumatic stress disorder, unspecified: Secondary | ICD-10-CM | POA: Diagnosis present

## 2015-12-04 DIAGNOSIS — Z79899 Other long term (current) drug therapy: Secondary | ICD-10-CM

## 2015-12-04 DIAGNOSIS — Z7984 Long term (current) use of oral hypoglycemic drugs: Secondary | ICD-10-CM

## 2015-12-04 DIAGNOSIS — Z590 Homelessness: Secondary | ICD-10-CM | POA: Diagnosis not present

## 2015-12-04 DIAGNOSIS — F1910 Other psychoactive substance abuse, uncomplicated: Secondary | ICD-10-CM | POA: Diagnosis present

## 2015-12-04 DIAGNOSIS — J40 Bronchitis, not specified as acute or chronic: Secondary | ICD-10-CM | POA: Diagnosis present

## 2015-12-04 DIAGNOSIS — Y95 Nosocomial condition: Secondary | ICD-10-CM | POA: Diagnosis present

## 2015-12-04 DIAGNOSIS — E871 Hypo-osmolality and hyponatremia: Secondary | ICD-10-CM | POA: Diagnosis present

## 2015-12-04 DIAGNOSIS — Z9119 Patient's noncompliance with other medical treatment and regimen: Secondary | ICD-10-CM | POA: Diagnosis not present

## 2015-12-04 DIAGNOSIS — Z833 Family history of diabetes mellitus: Secondary | ICD-10-CM | POA: Diagnosis not present

## 2015-12-04 DIAGNOSIS — I129 Hypertensive chronic kidney disease with stage 1 through stage 4 chronic kidney disease, or unspecified chronic kidney disease: Secondary | ICD-10-CM | POA: Diagnosis present

## 2015-12-04 DIAGNOSIS — I130 Hypertensive heart and chronic kidney disease with heart failure and stage 1 through stage 4 chronic kidney disease, or unspecified chronic kidney disease: Secondary | ICD-10-CM | POA: Diagnosis present

## 2015-12-04 DIAGNOSIS — R739 Hyperglycemia, unspecified: Secondary | ICD-10-CM | POA: Diagnosis present

## 2015-12-04 DIAGNOSIS — F1410 Cocaine abuse, uncomplicated: Secondary | ICD-10-CM | POA: Diagnosis present

## 2015-12-04 DIAGNOSIS — I248 Other forms of acute ischemic heart disease: Secondary | ICD-10-CM | POA: Diagnosis present

## 2015-12-04 DIAGNOSIS — E118 Type 2 diabetes mellitus with unspecified complications: Secondary | ICD-10-CM

## 2015-12-04 DIAGNOSIS — F1010 Alcohol abuse, uncomplicated: Secondary | ICD-10-CM | POA: Diagnosis present

## 2015-12-04 DIAGNOSIS — N189 Chronic kidney disease, unspecified: Secondary | ICD-10-CM

## 2015-12-04 DIAGNOSIS — N183 Chronic kidney disease, stage 3 (moderate): Secondary | ICD-10-CM | POA: Diagnosis present

## 2015-12-04 DIAGNOSIS — I10 Essential (primary) hypertension: Secondary | ICD-10-CM | POA: Diagnosis present

## 2015-12-04 LAB — GLUCOSE, CAPILLARY
GLUCOSE-CAPILLARY: 430 mg/dL — AB (ref 65–99)
GLUCOSE-CAPILLARY: 144 mg/dL — AB (ref 65–99)
GLUCOSE-CAPILLARY: 68 mg/dL (ref 65–99)
GLUCOSE-CAPILLARY: 79 mg/dL (ref 65–99)
GLUCOSE-CAPILLARY: 353 mg/dL — AB (ref 65–99)
GLUCOSE-CAPILLARY: 301 mg/dL — AB (ref 65–99)
Glucose-Capillary: 91 mg/dL (ref 65–99)
Glucose-Capillary: 147 mg/dL — ABNORMAL HIGH (ref 65–99)
Glucose-Capillary: 56 mg/dL — ABNORMAL LOW (ref 65–99)
Glucose-Capillary: 80 mg/dL (ref 65–99)
Glucose-Capillary: 105 mg/dL — ABNORMAL HIGH (ref 65–99)

## 2015-12-04 LAB — CBG MONITORING, ED
GLUCOSE-CAPILLARY: 589 mg/dL — AB (ref 65–99)
Glucose-Capillary: 531 mg/dL — ABNORMAL HIGH (ref 65–99)

## 2015-12-04 LAB — URINE MICROSCOPIC-ADD ON: BACTERIA UA: NONE SEEN

## 2015-12-04 LAB — CBC WITH DIFFERENTIAL/PLATELET
BASOS ABS: 0 10*3/uL (ref 0.0–0.1)
BASOS PCT: 0 %
EOS ABS: 0.2 10*3/uL (ref 0.0–0.7)
Eosinophils Relative: 4 %
HCT: 31.3 % — ABNORMAL LOW (ref 39.0–52.0)
HEMOGLOBIN: 10.5 g/dL — AB (ref 13.0–17.0)
LYMPHS ABS: 1.1 10*3/uL (ref 0.7–4.0)
Lymphocytes Relative: 25 %
MCH: 30.1 pg (ref 26.0–34.0)
MCHC: 33.5 g/dL (ref 30.0–36.0)
MCV: 89.7 fL (ref 78.0–100.0)
Monocytes Absolute: 0.4 10*3/uL (ref 0.1–1.0)
Monocytes Relative: 8 %
NEUTROS PCT: 63 %
Neutro Abs: 2.8 10*3/uL (ref 1.7–7.7)
PLATELETS: 158 10*3/uL (ref 150–400)
RBC: 3.49 MIL/uL — AB (ref 4.22–5.81)
RDW: 13.9 % (ref 11.5–15.5)
WBC: 4.5 10*3/uL (ref 4.0–10.5)

## 2015-12-04 LAB — URINALYSIS, ROUTINE W REFLEX MICROSCOPIC
BILIRUBIN URINE: NEGATIVE
KETONES UR: NEGATIVE mg/dL
Leukocytes, UA: NEGATIVE
Nitrite: NEGATIVE
PH: 6.5 (ref 5.0–8.0)
Protein, ur: 100 mg/dL — AB
SPECIFIC GRAVITY, URINE: 1.024 (ref 1.005–1.030)

## 2015-12-04 LAB — BASIC METABOLIC PANEL
Anion gap: 7 (ref 5–15)
BUN: 38 mg/dL — ABNORMAL HIGH (ref 6–20)
CALCIUM: 8 mg/dL — AB (ref 8.9–10.3)
CO2: 25 mmol/L (ref 22–32)
CREATININE: 1.8 mg/dL — AB (ref 0.61–1.24)
Chloride: 105 mmol/L (ref 101–111)
GFR calc Af Amer: 45 mL/min — ABNORMAL LOW (ref >60)
GFR calc non Af Amer: 39 mL/min — ABNORMAL LOW (ref >60)
GLUCOSE: 99 mg/dL (ref 65–99)
Potassium: 4 mmol/L (ref 3.5–5.1)
Sodium: 137 mmol/L (ref 135–145)

## 2015-12-04 LAB — COMPREHENSIVE METABOLIC PANEL
ALBUMIN: 2.5 g/dL — AB (ref 3.5–5.0)
ALK PHOS: 160 U/L — AB (ref 38–126)
ALT: 29 U/L (ref 17–63)
AST: 30 U/L (ref 15–41)
Anion gap: 7 (ref 5–15)
BUN: 41 mg/dL — AB (ref 6–20)
CALCIUM: 8.4 mg/dL — AB (ref 8.9–10.3)
CHLORIDE: 94 mmol/L — AB (ref 101–111)
CO2: 26 mmol/L (ref 22–32)
CREATININE: 1.93 mg/dL — AB (ref 0.61–1.24)
GFR calc Af Amer: 41 mL/min — ABNORMAL LOW (ref >60)
GFR calc non Af Amer: 36 mL/min — ABNORMAL LOW (ref >60)
GLUCOSE: 800 mg/dL — AB (ref 65–99)
Potassium: 4.6 mmol/L (ref 3.5–5.1)
SODIUM: 127 mmol/L — AB (ref 135–145)
Total Bilirubin: 0.8 mg/dL (ref 0.3–1.2)
Total Protein: 5.7 g/dL — ABNORMAL LOW (ref 6.5–8.1)

## 2015-12-04 LAB — RAPID URINE DRUG SCREEN, HOSP PERFORMED
Amphetamines: NOT DETECTED
BARBITURATES: NOT DETECTED
Benzodiazepines: NOT DETECTED
Cocaine: POSITIVE — AB
Opiates: NOT DETECTED
TETRAHYDROCANNABINOL: NOT DETECTED

## 2015-12-04 LAB — CG4 I-STAT (LACTIC ACID): Lactic Acid, Venous: 0.91 mmol/L (ref 0.5–2.0)

## 2015-12-04 LAB — BRAIN NATRIURETIC PEPTIDE: B NATRIURETIC PEPTIDE 5: 557.6 pg/mL — AB (ref 0.0–100.0)

## 2015-12-04 LAB — BLOOD GAS, ARTERIAL
Acid-base deficit: 1.3 mmol/L (ref 0.0–2.0)
BICARBONATE: 23.7 meq/L (ref 20.0–24.0)
FIO2: 0.21
O2 SAT: 99.3 %
PATIENT TEMPERATURE: 98.6
PH ART: 7.353 (ref 7.350–7.450)
TCO2: 22.4 mmol/L (ref 0–100)
pCO2 arterial: 43.6 mmHg (ref 35.0–45.0)
pO2, Arterial: 173 mmHg — ABNORMAL HIGH (ref 80.0–100.0)

## 2015-12-04 LAB — ETHANOL

## 2015-12-04 LAB — PROCALCITONIN: PROCALCITONIN: 0.52 ng/mL

## 2015-12-04 MED ORDER — DEXTROSE-NACL 5-0.45 % IV SOLN: INTRAVENOUS | Status: DC

## 2015-12-04 MED ORDER — INSULIN REGULAR BOLUS VIA INFUSION
0.0000 [IU] | Freq: Three times a day (TID) | INTRAVENOUS | Status: DC
  Administered 2015-12-04: 6.5 [IU] via INTRAVENOUS
  Filled 2015-12-04: qty 10

## 2015-12-04 MED ORDER — INSULIN GLARGINE 100 UNIT/ML ~~LOC~~ SOLN
7.0000 [IU] | Freq: Every day | SUBCUTANEOUS | Status: DC
  Administered 2015-12-04: 7 [IU] via SUBCUTANEOUS
  Filled 2015-12-04 (×2): qty 0.07

## 2015-12-04 MED ORDER — SODIUM CHLORIDE 0.9 % IV SOLN
INTRAVENOUS | Status: DC
  Administered 2015-12-04: 09:00:00 via INTRAVENOUS

## 2015-12-04 MED ORDER — INSULIN ASPART 100 UNIT/ML ~~LOC~~ SOLN
0.0000 [IU] | Freq: Three times a day (TID) | SUBCUTANEOUS | Status: DC
  Administered 2015-12-04: 15 [IU] via SUBCUTANEOUS

## 2015-12-04 MED ORDER — SODIUM CHLORIDE 0.9 % IV SOLN
INTRAVENOUS | Status: DC
  Filled 2015-12-04: qty 2.5

## 2015-12-04 MED ORDER — HYDRALAZINE HCL 25 MG PO TABS
25.0000 mg | ORAL_TABLET | Freq: Four times a day (QID) | ORAL | Status: DC
  Administered 2015-12-04 – 2015-12-05 (×3): 25 mg via ORAL
  Filled 2015-12-04 (×3): qty 1

## 2015-12-04 MED ORDER — SODIUM CHLORIDE 0.9 % IV BOLUS (SEPSIS)
1000.0000 mL | Freq: Once | INTRAVENOUS | Status: AC
  Administered 2015-12-04: 1000 mL via INTRAVENOUS

## 2015-12-04 MED ORDER — SODIUM CHLORIDE 0.9 % IV SOLN
INTRAVENOUS | Status: AC
  Administered 2015-12-04: 11:00:00 via INTRAVENOUS

## 2015-12-04 MED ORDER — DEXTROSE 50 % IV SOLN
25.0000 mL | INTRAVENOUS | Status: DC | PRN
  Administered 2015-12-04: 18 mL via INTRAVENOUS
  Filled 2015-12-04: qty 50

## 2015-12-04 MED ORDER — DEXTROSE-NACL 5-0.45 % IV SOLN
INTRAVENOUS | Status: DC
  Administered 2015-12-04: 08:00:00 via INTRAVENOUS

## 2015-12-04 MED ORDER — INSULIN ASPART 100 UNIT/ML ~~LOC~~ SOLN
0.0000 [IU] | Freq: Every day | SUBCUTANEOUS | Status: DC
  Administered 2015-12-04: 4 [IU] via SUBCUTANEOUS

## 2015-12-04 MED ORDER — SODIUM CHLORIDE 0.9 % IV SOLN
1000.0000 mL | INTRAVENOUS | Status: DC
  Administered 2015-12-04: 1000 mL via INTRAVENOUS

## 2015-12-04 MED ORDER — SODIUM CHLORIDE 0.9 % IV SOLN
INTRAVENOUS | Status: DC
  Administered 2015-12-04: 5.4 [IU]/h via INTRAVENOUS
  Filled 2015-12-04: qty 2.5

## 2015-12-04 MED ORDER — ENOXAPARIN SODIUM 40 MG/0.4ML ~~LOC~~ SOLN
40.0000 mg | SUBCUTANEOUS | Status: DC
  Filled 2015-12-04: qty 0.4

## 2015-12-04 MED ORDER — SODIUM CHLORIDE 0.9 % IJ SOLN: 3.0000 mL | Freq: Two times a day (BID) | INTRAMUSCULAR | Status: DC

## 2015-12-04 MED ORDER — VANCOMYCIN HCL IN DEXTROSE 1-5 GM/200ML-% IV SOLN
1000.0000 mg | Freq: Once | INTRAVENOUS | Status: DC
  Filled 2015-12-04: qty 200

## 2015-12-04 MED ORDER — GUAIFENESIN-DM 100-10 MG/5ML PO SYRP: 5.0000 mL | ORAL_SOLUTION | ORAL | Status: DC | PRN

## 2015-12-04 MED ORDER — PIPERACILLIN-TAZOBACTAM 3.375 G IVPB 30 MIN
3.3750 g | Freq: Once | INTRAVENOUS | Status: DC
  Administered 2015-12-04: 3.375 g via INTRAVENOUS
  Filled 2015-12-04: qty 50

## 2015-12-04 MED ORDER — AMLODIPINE BESYLATE 5 MG PO TABS
5.0000 mg | ORAL_TABLET | Freq: Every day | ORAL | Status: DC
  Filled 2015-12-04 (×2): qty 1

## 2015-12-04 NOTE — ED Notes (Signed)
Pt's I stat-CG4 is 0.91

## 2015-12-04 NOTE — Progress Notes (Signed)
At 0832 Pt appeared disoriented and mumbling, checked CBG, 56, initiated hypoglycemic protocol, Dextrose given at 0835, insulin gtt placed on hold, rechecked CBG 147 at this time

## 2015-12-04 NOTE — ED Notes (Signed)
Critical glucose of 800 told to Dr. Elesa MassedWard by this writer. Dr. Elesa MassedWard acknowledged.

## 2015-12-04 NOTE — Progress Notes (Addendum)
Inpatient Diabetes Program Recommendations  AACE/ADA: New Consensus Statement on Inpatient Glycemic Control (2015)  Target Ranges:  Prepandial:   less than 140 mg/dL      Peak postprandial:   less than 180 mg/dL (1-2 hours)      Critically ill patients:  140 - 180 mg/dL   Results for Clayton [name], [name] (MRN [id number]) as of 12/04/2015 11:04  Ref. Range 12/04/2015 01:20 12/04/2015 04:00 12/04/2015 05:06 12/04/2015 07:24  Glucose-Capillary Latest Ref Range: 65-99 mg/dL >829>600 (HH) 562531 (H) 130589 (HH) 144 (H)    Admit with: Hyperglycemia/ Bronchitis  History: DM, HTN, CKD3  Home DM Meds: Glyburide 10 mg bid (patient not taking per report)  Current Insulin Orders: Lantus 7 units daily      Novolog Moderate SSI (0-15 units) TID AC + HS     -Per Dr. Sheryn Bisonanford's H&P note: "The patient has been admitted 4 times in the last 3 months for hyperglycemia, usually with demand ischemia, hypertensive urgency, and vomiting. Most of those times he has left AMA, and has insisted that he is not able to take insulin."  -Well known to the Inpatient Glycemic Control Team- Was counseled extensively back in October (10/09/15).  Despite this counseling, patient refuses to take insulin and will not even take oral DM meds or check CBGs at home.  -Note patient transitioned off IV insulin drip this AM after having Hypoglycemia.  Will get Lantus 7 units this AM and will start Novolog Moderate SSI as well.  -Current A1c pending- Expect results to be elevated.     --Will follow patient during hospitalization--  Ambrose FinlandJeannine Johnston Anav Lammert RN, MSN, CDE Diabetes Coordinator Inpatient Glycemic Control Team Team Pager: [phone] (8a-5p)

## 2015-12-04 NOTE — ED Notes (Signed)
Pt here via EMS with c/o hyperglycemia. Pt has hx of hypertension. Pt is noncompliant with his bp medication as well as his insulin. Pt had Right bundle branch block per EMS

## 2015-12-04 NOTE — ED Notes (Signed)
Bed: ZO10WA12 Expected date:  Expected time:  Means of arrival:  Comments: hyperglycemia

## 2015-12-04 NOTE — H&P (Signed)
History and Physical  Patient Name: Clayton [name]     FAO:[id number]    DOB: 01-06-1951    DOA: 12/04/2015 Referring physician: Pryor Curia, MD PCP: No PCP Per Patient      Chief Complaint: Malaise and cough  HPI: Clayton [name] is a 64 y.o. 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  The patient was in his usual state of health until about 2 weeks ago when he started to develop congestion, cough, and chest tightness. The cough is productive of yellowish sputum, and has continued to get worse over the last 2 weeks. During this. He has had intermittent blurry vision, polyuria, nausea, emesis, and has not been using his Cardizem, glyburide, or insulin.  Today, he had a PCN cake and felt worse, achy, nauseous and so he came into the hospital.  In the ED, the patient was hypertensive, had an elevated BUN to creatinine ratio, and serum glucose 800.  There is a concern for HCAP and so antibiotics were administered and blood cultures were obtained, and TRH were asked to admit.  The patient has been admitted 4 times in the last 3 months for hyperglycemia, usually with demand ischemia, hypertensive urgency, and vomiting. Most of those times he has left AMA, and has insisted that he is not able to take insulin.      Review of Systems:  Pt complains of nausea, emesis, cough, congestion, runny nose, fever, polyuria, blurry vision, chest tightness, increased leg swelling, malaise, body aches. Pt denies any passing out, seizures, chest pain with exertion, hematemesis, hematochezia.  All other systems negative except as just noted or noted in the history of present illness.   Allergies:  Allergies  Allergen Reactions  . Latex Rash    Home medications: None currently  Past medical history: 1. T2DM, last HgbA1c Oct and 14.7% 2. Hypertension 3. CKD  stage III, baseline EGFR 50 with proteinuria 4. Chronic anemia of renal disease  Past surgical history: None  Family history:  Mother, diabetes. Father, diabetes. Maternal grandfather, stroke.  Social History:  Patient lives in housing through his church. He lives in an apartment or house on [address]. He is an intermittent smoker. He has only an aunt in [place]. He walks without a cane or walker and is independent with all ADLs and IADLs.        Physical Exam: BP 188/100 mmHg  Pulse 96  Temp(Src) 98.4 F (36.9 C) (Oral)  Resp 18  Wt 77 kg (169 lb 12.1 oz)  SpO2 98% General appearance: Thin, adult male, alert and in no acute distress.   Eyes: Anicteric, lids and lashes normal.     ENT: No nasal deformity.  Clear nasal discharge present.  OP moist without lesions.   Lymph: No cervical, supraclavicular lymphadenopathy. Skin: Warm and dry.  Cardiac: RRR, nl S1-S2, no murmurs appreciated.  Capillary refill is brisk.  No LE edema.   Respiratory: Normal respiratory rate and rhythm.  I do not appreciate rales or wheezes. Abdomen: Abdomen soft without rigidity.  No TTP. No ascites, distension.   MSK: No deformities or effusions. Neuro: Sensorium intact and responding to questions, attention normal.  Speech is fluent.  Moves all extremities equally and with normal coordination.    Psych: Behavior appropriate.  Affect normal.  No evidence of aural or visual hallucinations or delusions.  Labs on Admission:  The metabolic panel shows hyponatremia, which corrects to normal. Serum creatinine 1.93 mg/dL from the last discharge of 1.64 mg/dL. Hyperglycemia to 800 mg/dL. Alkaline phosphatase is slightly elevated. Albumin level is low. The complete blood count shows chronic anemia, normocytic. No leukocytosis. The urinalysis shows glucose no ketones or pyuria.   Radiological Exams on Admission: Personally reviewed: Dg Chest 2 View  12/04/2015  CLINICAL DATA:  Acute  onset of hyperglycemia. Dizziness, cough and congestion. Vomiting and diarrhea. Initial encounter. EXAM: CHEST  2 VIEW COMPARISON:  Chest radiograph performed 10/31/2015 FINDINGS: The lungs are well-aerated. Mild vascular congestion is noted. Peribronchial thickening is noted. Minimal bilateral opacities may reflect atelectasis or mild pneumonia, depending on the patient's symptoms. There is no evidence of pleural effusion or pneumothorax. The heart is mildly enlarged. No acute osseous abnormalities are seen. IMPRESSION: Mild vascular congestion and mild cardiomegaly. Peribronchial thickening noted. Minimal bilateral opacities may reflect atelectasis or possibly mild pneumonia, depending on the patient's symptoms. Electronically Signed   By: Garald Balding M.D.   On: 12/04/2015 03:32    EKG: Independently reviewed. Rate normal, sinus. Nonspecific IVCD, QRSd 138.  QTc 474.    Assessment/Plan 1. Hyperglycemia in uncontrolled IDDM:  -Insulin per protocol -Repeat BMP in 6 hours -IVF  2. Hypertensive urgency and HTN:  -Start amlodipine 5 mg daily  3. Cough:  Clinically this is bronchitis.  Patient is stable, and I am hesitant to initiate broad spectrum antibiotics.   -Check procalcitonin and monitor blood cultures, if either positive or if patient deteriorates, start antibiotics -Cough syrup  4. CKD stage III:  Stable. -Avoid hypovolemia or nephrotoxins -Encouraged glycemic and blood pressure control, patient aware, but has somewhat fatalistic attitude and shifts conversation to his living situation  5. Elevated alkaline phosphatase:  Unclear etiology.  Presumably from vomiting.   -Repeat LFT  6. Chronic anemia:  Stable.     DVT PPx: Lovenox Diet: Diabetic Consultants: None Code Status: Full Family Communication: None  Medical decision making: What exists of the patient's previous chart was reviewed in depth and the case was discussed with Dr. Leonides Schanz. Patient seen 5:03 AM on  12/04/2015.  Disposition Plan:  Admit to inapteitn for hypertensive urgency and hyperglycemia.  Rule out HCAP.  Anticipate several days admission to stabilize sugar and arrange suitable outpatient diabetes and antihypertensive regimen.      Edwin Dada [facility] Pager [phone]

## 2015-12-04 NOTE — Progress Notes (Signed)
Pt admitted after midnight  Please refer to admission note 12/04/2015  64 y.o. 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 On admission, he was hyperglycemic with glucose on BMP 800 and CBG 531. NO ketones in urine. He was started on insulin drip. CXR on admission showed peribronchial thickening and possible mild pneumonia. No fevers on admission and normal WBC count for which reason he was not started on empiric abx. His respiratory status is stable at this time.  In addition, his BP is now 194/99 which we will treat with hydralazine  Manson Passeylma Naylee Frankowski [facility] [phone]

## 2015-12-04 NOTE — ED Provider Notes (Signed)
By signing my name below, I, Ronney LionSuzanne Le, attest that this documentation has been prepared under the direction and in the presence of Kristen N Ward, DO. Electronically Signed: Ronney LionSuzanne Le, ED Scribe. 12/04/2015. 4:25 AM.   TIME SEEN: 3:03 AM  CHIEF COMPLAINT: Hyperglycemia  HPI:  Clayton [name] is a 64 y.o. male with a history of HTN, DM, homelessness, noncompliance with medications, PTSD, and alcohol abuse, who presents to the Emergency Department brought in by ambulance, with multiple complaints, including hyperglycemia. Patient states he originally called EMS because he felt "funny," explaining that he felt dizzy. He complains of vomiting and diarrhea and notes URI-like symptoms over the past several days, including cough and congestion. Patient states he ate a piece of cake today. Per medical records, patient used to take glyburide, but he reports noncompliance at this time.  PCP: Dr. Dorna BloomWu  ROS: See HPI Constitutional: no fever  Eyes: no drainage  ENT: no runny nose   Cardiovascular:  no chest pain  Resp: no SOB  GI:  vomiting and diarrhea GU: no dysuria Integumentary: no rash  Allergy: no hives  Musculoskeletal: no leg swelling  Neurological: no slurred speech ROS otherwise negative  PAST MEDICAL HISTORY/PAST SURGICAL HISTORY:  Past Medical History  Diagnosis Date  . Hypertension   . Diabetes mellitus   . Homelessness   . Periodontal disease   . Noncompliance with medications   . PTSD (post-traumatic stress disorder)   . Alcohol abuse, unspecified     MEDICATIONS:  Prior to Admission medications   Medication Sig Start Date End Date Taking? Authorizing Provider  diltiazem (CARDIZEM SR) 120 MG 12 hr capsule Take 1 capsule (120 mg total) by mouth every 12 (twelve) hours. 11/24/15   Derwood KaplanAnkit Nanavati, MD  folic acid (FOLVITE) 1 MG tablet Take 1 tablet (1 mg total) by mouth daily. Patient not taking: Reported on 11/24/2015 11/01/15   Richarda OverlieNayana Abrol, MD  glyBURIDE (DIABETA) 5 MG  tablet Take 2 tablets (10 mg total) by mouth 2 (two) times daily with a meal. 11/24/15   Derwood KaplanAnkit Nanavati, MD    ALLERGIES:  Allergies  Allergen Reactions  . Latex Rash    SOCIAL HISTORY:  Social History  Substance Use Topics  . Smoking status: Current Every Day Smoker -- 0.50 packs/day    Types: Cigarettes  . Smokeless tobacco: Not on file  . Alcohol Use: Yes    FAMILY HISTORY: Family History  Problem Relation Age of Onset  . Diabetes Mellitus II Mother   . Diabetes Mellitus II Father   . Stroke Maternal Grandfather     EXAM: BP 194/99 mmHg  Pulse 98  Temp(Src) 98 F (36.7 C) (Oral)  Resp 18  Ht 5\' 11"  (1.803 m)  Wt 172 lb 9.9 oz (78.3 kg)  BMI 24.09 kg/m2  SpO2 100% CONSTITUTIONAL: Alert and oriented and responds appropriately to questions; agitated, yelling, disheveled; Chronically ill-appearing; disheveled HEAD: Normocephalic EYES: Conjunctivae clear, PERRL ENT: normal nose; no rhinorrhea; moist mucous membranes; pharynx without lesions noted NECK: Supple, no meningismus, no LAD  CARD: RRR; S1 and S2 appreciated; no murmurs, no clicks, no rubs, no gallops RESP: Normal chest excursion without splinting or tachypnea; breath sounds clear and equal bilaterally; no wheezes, no rhonchi, no rales, no hypoxia or respiratory distress, speaking full sentences ABD/GI: Normal bowel sounds; non-distended; soft, non-tender, no rebound, no guarding, no peritoneal signs BACK:  The back appears normal and is non-tender to palpation, there is no CVA tenderness EXT: Normal ROM in  all joints; non-tender to palpation; no edema; normal capillary refill; no cyanosis, no calf tenderness or swelling    SKIN: Normal color for age and race; warm NEURO: Moves all extremities equally, sensation to light touch intact diffusely, cranial nerves II through XII intact PSYCH: Patient agitated but able to be redirected.  MEDICAL DECISION MAKING: Patient here with hyperglycemia and mild hypertension.  Suspect this is secondary to medication noncompliance. Also reports nasal congestion, cough of the past several days and subjective fevers. We'll proceed with chest x-ray. Lungs are clear. Afebrile in the emergency.  ED PROGRESS: Patient's labs show hyperglycemia with blood glucose of 800 without signs of DKA. No ketones in his urine. ABG within normal limits. Chest x-ray shows bilateral opacities likely secondary to pneumonia. Patient has been admitted to the hospital in the past 60 days. Will treat for healthcare associated pneumonia with vancomycin and Zosyn. Will continue IV hydration and insulin drip.   4:24 AM - Talked to Dr. Maryfrances Bunnell for admission to telemetry inpatient. I will place holding orders, per his request. Pt updated with plan. Patient agrees to stay in the hospital at this time.      EKG Interpretation  Date/Time:  Monday December 04 2015 05:23:12 EST Ventricular Rate:  94 PR Interval:  137 QRS Duration: 139 QT Interval:  382 QTC Calculation: 478 R Axis:   91 Text Interpretation:  Sinus rhythm Probable left atrial enlargement Right bundle branch block Minimal ST elevation, anterior leads No significant change since last tracing other than lateral T wave changes have improved Confirmed by WARD,  DO, KRISTEN ([id number]) on 12/04/2015 6:15:22 AM       CRITICAL CARE Performed by: Layla Maw Ward, DO Total critical care time: 45 minutes Critical care time was exclusive of separately billable procedures and treating other patients. Critical care was necessary to treat or prevent imminent or life-threatening deterioration. Critical care was time spent personally by me on the following activities: development of treatment plan with patient and/or surrogate as well as nursing, discussions with consultants, evaluation of patient's response to treatment, examination of patient, obtaining history from patient or surrogate, ordering and performing treatments and interventions, ordering  and review of laboratory studies, ordering and review of radiographic studies, pulse oximetry and re-evaluation of patient's condition.    I personally performed the services described in this documentation, which was scribed in my presence. The recorded information has been reviewed and is accurate.   Layla Maw Ward, DO 12/04/15 [phone]

## 2015-12-05 DIAGNOSIS — F1410 Cocaine abuse, uncomplicated: Secondary | ICD-10-CM

## 2015-12-05 DIAGNOSIS — Z9114 Patient's other noncompliance with medication regimen: Secondary | ICD-10-CM

## 2015-12-05 DIAGNOSIS — F1010 Alcohol abuse, uncomplicated: Secondary | ICD-10-CM

## 2015-12-05 DIAGNOSIS — E871 Hypo-osmolality and hyponatremia: Secondary | ICD-10-CM

## 2015-12-05 LAB — HEMOGLOBIN A1C
HEMOGLOBIN A1C: 14.7 % — AB (ref 4.8–5.6)
Mean Plasma Glucose: 375 mg/dL

## 2015-12-05 MED ORDER — GLYBURIDE 5 MG PO TABS: 10.0000 mg | ORAL_TABLET | Freq: Two times a day (BID) | ORAL | Status: DC

## 2015-12-05 MED ORDER — DILTIAZEM HCL ER 120 MG PO CP12: 120.0000 mg | ORAL_CAPSULE | Freq: Two times a day (BID) | ORAL | Status: DC

## 2015-12-05 MED ORDER — HYDRALAZINE HCL 25 MG PO TABS: 25.0000 mg | ORAL_TABLET | Freq: Four times a day (QID) | ORAL | Status: DC

## 2015-12-05 NOTE — Discharge Instructions (Addendum)
Blood Glucose Monitoring, Adult °Monitoring your blood glucose (also know as blood sugar) helps you to manage your diabetes. It also helps you and your health care provider monitor your diabetes and determine how well your treatment plan is working. °WHY SHOULD YOU MONITOR YOUR BLOOD GLUCOSE? °· It can help you understand how food, exercise, and medicine affect your blood glucose. °· It allows you to know what your blood glucose is at any given moment. You can quickly tell if you are having low blood glucose (hypoglycemia) or high blood glucose (hyperglycemia). °· It can help you and your health care provider know how to adjust your medicines. °· It can help you understand how to manage an illness or adjust medicine for exercise. °WHEN SHOULD YOU TEST? °Your health care provider will help you decide how often you should check your blood glucose. This may depend on the type of diabetes you have, your diabetes control, or the types of medicines you are taking. Be sure to write down all of your blood glucose readings so that this information can be reviewed with your health care provider. See below for examples of testing times that your health care provider may suggest. °Type 1 Diabetes °· Test at least 2 times per day if your diabetes is well controlled, if you are using an insulin pump, or if you perform multiple daily injections. °· If your diabetes is not well controlled or if you are sick, you may need to test more often. °· It is a good idea to also test: °¨ Before every insulin injection. °¨ Before and after exercise. °¨ Between meals and 2 hours after a meal. °¨ Occasionally between 2:00 a.m. and 3:00 a.m. °Type 2 Diabetes °· If you are taking insulin, test at least 2 times per day. However, it is best to test before every insulin injection. °· If you take medicines by mouth (orally), test 2 times a day. °· If you are on a controlled diet, test once a day. °· If your diabetes is not well controlled or if you  are sick, you may need to monitor more often. °HOW TO MONITOR YOUR BLOOD GLUCOSE °Supplies Needed °· Blood glucose meter. °· Test strips for your meter. Each meter has its own strips. You must use the strips that go with your own meter. °· A pricking needle (lancet). °· A device that holds the lancet (lancing device). °· A journal or log book to write down your results. °Procedure °· Wash your hands with soap and water. Alcohol is not preferred. °· Prick the side of your finger (not the tip) with the lancet. °· Gently milk the finger until a small drop of blood appears. °· Follow the instructions that come with your meter for inserting the test strip, applying blood to the strip, and using your blood glucose meter. °Other Areas to Get Blood for Testing °Some meters allow you to use other areas of your body (other than your finger) to test your blood. These areas are called alternative sites. The most common alternative sites are: °· The forearm. °· The thigh. °· The back area of the lower leg. °· The palm of the hand. °The blood flow in these areas is slower. Therefore, the blood glucose values you get may be delayed, and the numbers are different from what you would get from your fingers. Do not use alternative sites if you think you are having hypoglycemia. Your reading will not be accurate. Always use a finger if you are   having hypoglycemia. Also, if you cannot feel your lows (hypoglycemia unawareness), always use your fingers for your blood glucose checks. ADDITIONAL TIPS FOR GLUCOSE MONITORING  Do not reuse lancets.  Always carry your supplies with you.  All blood glucose meters have a 24-hour "hotline" number to call if you have questions or need help.  Adjust (calibrate) your blood glucose meter with a control solution after finishing a few boxes of strips. BLOOD GLUCOSE RECORD KEEPING It is a good idea to keep a daily record or log of your blood glucose readings. Most glucose meters, if not all,  keep your glucose records stored in the meter. Some meters come with the ability to download your records to your home computer. Keeping a record of your blood glucose readings is especially helpful if you are wanting to look for patterns. Make notes to go along with the blood glucose readings because you might forget what happened at that exact time. Keeping good records helps you and your health care provider to work together to achieve good diabetes management.    This information is not intended to replace advice given to you by your health care provider. Make sure you discuss any questions you have with your health care provider.   Document Released: 11/28/2003 Document Revised: 12/16/2014 Document Reviewed: 04/19/2013 Elsevier Interactive Patient Education 2016 [employer] Diltiazem extended-release capsules or tablets What is this medicine? DILTIAZEM (dil TYE a zem) is a calcium-channel blocker. It affects the amount of calcium found in your heart and muscle cells. This relaxes your blood vessels, which can reduce the amount of work the heart has to do. This medicine is used to treat high blood pressure and chest pain caused by angina. This medicine may be used for other purposes; ask your health care provider or pharmacist if you have questions. What should I tell my health care provider before I take this medicine? They need to know if you have any of these conditions: -heart problems, low blood pressure, irregular heartbeat -liver disease -previous heart attack -an unusual or allergic reaction to diltiazem, other medicines, foods, dyes, or preservatives -pregnant or trying to get pregnant -breast-feeding How should I use this medicine? Take this medicine by mouth with a glass of water. Follow the directions on the prescription label. Swallow whole, do not crush or chew. Ask your doctor or pharmacist if your should take this medicine with food. Take your doses at regular intervals. Do  not take your medicine more often then directed. Do not stop taking except on the advice of your doctor or health care professional. Ask your doctor or health care professional how to gradually reduce the dose. Talk to your pediatrician regarding the use of this medicine in children. Special care may be needed. Overdosage: If you think you have taken too much of this medicine contact a poison control center or emergency room at once. NOTE: This medicine is only for you. Do not share this medicine with others. What if I miss a dose? If you miss a dose, take it as soon as you can. If it is almost time for your next dose, take only that dose. Do not take double or extra doses. What may interact with this medicine? Do not take this medicine with any of the following medications: -cisapride -hawthorn -pimozide -ranolazine -red yeast rice This medicine may also interact with the following medications: -buspirone -carbamazepine -cimetidine -cyclosporine -digoxin -local anesthetics or general anesthetics -lovastatin -medicines for anxiety or difficulty sleeping like midazolam and  triazolam -medicines for high blood pressure or heart problems -quinidine -rifampin, rifabutin, or rifapentine This list may not describe all possible interactions. Give your health care provider a list of all the medicines, herbs, non-prescription drugs, or dietary supplements you use. Also tell them if you smoke, drink alcohol, or use illegal drugs. Some items may interact with your medicine. What should I watch for while using this medicine? Check your blood pressure and pulse rate regularly. Ask your doctor or health care professional what your blood pressure and pulse rate should be and when you should contact him or her. You may feel dizzy or lightheaded. Do not drive, use machinery, or do anything that needs mental alertness until you know how this medicine affects you. To reduce the risk of dizzy or fainting  spells, do not sit or stand up quickly, especially if you are an older patient. Alcohol can make you more dizzy or increase flushing and rapid heartbeats. Avoid alcoholic drinks. What side effects may I notice from receiving this medicine? Side effects that you should report to your doctor or health care professional as soon as possible: -allergic reactions like skin rash, itching or hives, swelling of the face, lips, or tongue -confusion, mental depression -feeling faint or lightheaded, falls -redness, blistering, peeling or loosening of the skin, including inside the mouth -slow, irregular heartbeat -swelling of the feet and ankles -unusual bleeding or bruising, pinpoint red spots on the skin Side effects that usually do not require medical attention (report to your doctor or health care professional if they continue or are bothersome): -constipation or diarrhea -difficulty sleeping -facial flushing -headache -nausea, vomiting -sexual dysfunction -weak or tired This list may not describe all possible side effects. Call your doctor for medical advice about side effects. You may report side effects to FDA at 1-800-FDA-1088. Where should I keep my medicine? Keep out of the reach of children. Store at room temperature between 15 and 30 degrees C (59 and 86 degrees F). Protect from humidity. Throw away any unused medicine after the expiration date. NOTE: This sheet is a summary. It may not cover all possible information. If you have questions about this medicine, talk to your doctor, pharmacist, or health care provider.    2016, Elsevier/Gold Standard. (2008-03-17 14:35:47) Hydralazine tablets What is this medicine? HYDRALAZINE (hye DRAL a zeen) is a type of vasodilator. It relaxes blood vessels, increasing the blood and oxygen supply to your heart. This medicine is used to treat high blood pressure. This medicine may be used for other purposes; ask your health care provider or pharmacist  if you have questions. What should I tell my health care provider before I take this medicine? They need to know if you have any of these conditions: -blood vessel disease -heart disease including angina or history of heart attack -kidney or liver disease -systemic lupus erythematosus (SLE) -an unusual or allergic reaction to hydralazine, tartrazine dye, other medicines, foods, dyes, or preservatives -pregnant or trying to get pregnant -breast-feeding How should I use this medicine? Take this medicine by mouth with a glass of water. Follow the directions on the prescription label. Take your doses at regular intervals. Do not take your medicine more often than directed. Do not stop taking except on the advice of your doctor or health care professional. Talk to your pediatrician regarding the use of this medicine in children. Special care may be needed. While this drug may be prescribed for children for selected conditions, precautions do apply. Overdosage: If you  think you have taken too much of this medicine contact a poison control center or emergency room at once. NOTE: This medicine is only for you. Do not share this medicine with others. What if I miss a dose? If you miss a dose, take it as soon as you can. If it is almost time for your next dose, take only that dose. Do not take double or extra doses. What may interact with this medicine? -medicines for high blood pressure -medicines for mental depression This list may not describe all possible interactions. Give your health care provider a list of all the medicines, herbs, non-prescription drugs, or dietary supplements you use. Also tell them if you smoke, drink alcohol, or use illegal drugs. Some items may interact with your medicine. What should I watch for while using this medicine? Visit your doctor or health care professional for regular checks on your progress. Check your blood pressure and pulse rate regularly. Ask your doctor or  health care professional what your blood pressure and pulse rate should be and when you should contact him or her. You may get drowsy or dizzy. Do not drive, use machinery, or do anything that needs mental alertness until you know how this medicine affects you. Do not stand or sit up quickly, especially if you are an older patient. This reduces the risk of dizzy or fainting spells. Alcohol may interfere with the effect of this medicine. Avoid alcoholic drinks. Do not treat yourself for coughs, colds, or pain while you are taking this medicine without asking your doctor or health care professional for advice. Some ingredients may increase your blood pressure. What side effects may I notice from receiving this medicine? Side effects that you should report to your doctor or health care professional as soon as possible: -chest pain, or fast or irregular heartbeat -fever, chills, or sore throat -numbness or tingling in the hands or feet -shortness of breath -skin rash, redness, blisters or itching -stiff or swollen joints -sudden weight gain -swelling of the feet or legs -swollen lymph glands -unusual weakness Side effects that usually do not require medical attention (report to your doctor or health care professional if they continue or are bothersome): -diarrhea, or constipation -headache -loss of appetite -nausea, vomiting This list may not describe all possible side effects. Call your doctor for medical advice about side effects. You may report side effects to FDA at 1-800-FDA-1088. Where should I keep my medicine? Keep out of the reach of children. Store at room temperature between 15 and 30 degrees C (59 and 86 degrees F). Throw away any unused medicine after the expiration date. NOTE: This sheet is a summary. It may not cover all possible information. If you have questions about this medicine, talk to your doctor, pharmacist, or health care provider.    2016, Elsevier/[employer] Standard.  (2008-04-08 15:44:58)

## 2015-12-05 NOTE — Plan of Care (Signed)
Problem: Nutrition: Goal: Adequate nutrition will be maintained Outcome: Not Progressing Pt needs more education on how diet effects his blood sugar levels, last CBG 301.

## 2015-12-05 NOTE — Discharge Summary (Signed)
Physician Discharge Summary  Clayton [name] ZOX:[id number] DOB: 1951/02/13 DOA: 12/04/2015  PCP: No PCP Per Patient - pt did not give us the name of his PCP  Admit date: 12/04/2015 Discharge date: 12/05/2015  Recommendations for Outpatient Follow-up:  1. F/U with PCP in 1 week to make sure BP stable  Please note pt was very angry this am, yelling at the staff, myself, cursing, abusive and has refused CBG check, medications.  The pt said he wanted to go home and has refused to let me do physical exam. He told me and RN and tech to "get out of his room".   Discharge Diagnoses:  Active Problems:   Hyponatremia   Noncompliance with medications   Hyperglycemia   Hypertensive urgency   Alcohol abuse   Acute renal failure superimposed on stage 3 chronic kidney disease (HCC)   Essential hypertension   Cocaine abuse   Chronic diastolic CHF (congestive heart failure) (HCC)   HCAP (healthcare-associated pneumonia)    Discharge Condition: stable   Diet recommendation: as tolerated   History of present illness:  64 y.o. 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 On admission, he was hyperglycemic with glucose on BMP 800 and CBG 531. NO ketones in urine. He was started on insulin drip. CXR on admission showed peribronchial thickening and possible mild pneumonia. No fevers on admission and normal WBC count for which reason he was not started on empiric abx. His respiratory status is stable  Prescriptions given for hydralazine, Cardizem and glyburide   Signed:  Manson PasseyEVINE, Boomer Winders, MD  [facility] 12/05/2015, 8:48 AM  Pager #: [phone]  Time spent in minutes: more than 30 minutes  Procedures:  None   Consultations:  None   Discharge Exam: Filed Vitals:    12/04/15 2134 12/05/15 0554  BP: 182/93 182/92  Pulse: 91 92  Temp: 97.4 F (36.3 C) 99.3 F (37.4 C)  Resp: 20 18   Filed Vitals:   12/04/15 0621 12/04/15 1445 12/04/15 2134 12/05/15 0554  BP: 194/99 181/95 182/93 182/92  Pulse: 98 92 91 92  Temp: 98 F (36.7 C) 98.2 F (36.8 C) 97.4 F (36.3 C) 99.3 F (37.4 C)  TempSrc: Oral Oral Oral Oral  Resp: 18 18 20 18   Height: 5\' 11"  (1.803 m)     Weight: 78.3 kg (172 lb 9.9 oz)     SpO2: 100% 98% 98% 99%     Discharge Instructions  Discharge Instructions    Call MD for:  difficulty breathing, headache or visual disturbances    Complete by:  As directed      Call MD for:  persistant dizziness or light-headedness    Complete by:  As directed      Call MD for:  persistant nausea and vomiting    Complete by:  As directed      Call MD for:  severe uncontrolled pain    Complete by:  As directed      Diet - low sodium heart healthy    Complete by:  As directed      Discharge instructions    Complete by:  As directed   Please take Cardizem and hydralazine for blood pressure control Take glyburide for diabetes Follow up with PCP in 1 week to make sure blood sugars  are stable and BP is stable     Increase activity slowly    Complete by:  As directed             Medication List    TAKE these medications        diltiazem 120 MG 12 hr capsule  Commonly known as:  CARDIZEM SR  Take 1 capsule (120 mg total) by mouth every 12 (twelve) hours.     folic acid 1 MG tablet  Commonly known as:  FOLVITE  Take 1 tablet (1 mg total) by mouth daily.     glyBURIDE 5 MG tablet  Commonly known as:  DIABETA  Take 2 tablets (10 mg total) by mouth 2 (two) times daily with a meal.     hydrALAZINE 25 MG tablet  Commonly known as:  APRESOLINE  Take 1 tablet (25 mg total) by mouth every 6 (six) hours.           Follow-up Information    Follow up with [facility]. Schedule an appointment as soon as  possible for a visit in 1 week.   Why:  Follow up appt after recent hospitalization   Contact information:   [address] [facility] [address] [phone]       The results of significant diagnostics from this hospitalization (including imaging, microbiology, ancillary and laboratory) are listed below for reference.    Significant Diagnostic Studies: Dg Chest 2 View  12/04/2015  CLINICAL DATA:  Acute onset of hyperglycemia. Dizziness, cough and congestion. Vomiting and diarrhea. Initial encounter. EXAM: CHEST  2 VIEW COMPARISON:  Chest radiograph performed 10/31/2015 FINDINGS: The lungs are well-aerated. Mild vascular congestion is noted. Peribronchial thickening is noted. Minimal bilateral opacities may reflect atelectasis or mild pneumonia, depending on the patient's symptoms. There is no evidence of pleural effusion or pneumothorax. The heart is mildly enlarged. No acute osseous abnormalities are seen. IMPRESSION: Mild vascular congestion and mild cardiomegaly. Peribronchial thickening noted. Minimal bilateral opacities may reflect atelectasis or possibly mild pneumonia, depending on the patient's symptoms. Electronically Signed   By: Roanna Raider M.D.   On: 12/04/2015 03:32    Microbiology: Recent Results (from the past 240 hour(s))  Blood culture (routine x 2)     Status: None (Preliminary result)   Collection Time: 12/04/15  3:45 AM  Result Value Ref Range Status   Specimen Description BLOOD RIGHT HAND  Final   Special Requests IN PEDIATRIC BOTTLE 4CC  Final   Culture   Final    NO GROWTH 1 DAY Performed at [facility]    Report Status PENDING  Incomplete  Blood culture (routine x 2)     Status: None (Preliminary result)   Collection Time: 12/04/15  4:50 AM  Result Value Ref Range Status   Specimen Description BLOOD LEFT WRIST  Final   Special Requests BOTTLES DRAWN AEROBIC AND ANAEROBIC 5CC  Final   Culture   Final    NO GROWTH < 24  HOURS Performed at [facility]    Report Status PENDING  Incomplete     Labs: Basic Metabolic Panel:  Recent Labs Lab 12/04/15 0247 12/04/15 1123  NA 127* 137  K 4.6 4.0  CL 94* 105  CO2 26 25  GLUCOSE 800* 99  BUN 41* 38*  CREATININE 1.93* 1.80*  CALCIUM 8.4* 8.0*   Liver Function Tests:  Recent Labs Lab 12/04/15 0247  AST 30  ALT 29  ALKPHOS 160*  BILITOT 0.8  PROT 5.7*  ALBUMIN 2.5*   No results for input(s): LIPASE, AMYLASE in the last 168 hours. No results for input(s): AMMONIA in the last 168 hours. CBC:  Recent Labs Lab 12/04/15 0247  WBC 4.5  NEUTROABS 2.8  HGB 10.5*  HCT 31.3*  MCV 89.7  PLT 158   Cardiac Enzymes: No results for input(s): CKTOTAL, CKMB, CKMBINDEX, TROPONINI in the last 168 hours. BNP: BNP (last 3 results)  Recent Labs  08/17/15 0330 12/04/15 0422  BNP 303.5* 557.6*    ProBNP (last 3 results) No results for input(s): PROBNP in the last 8760 hours.  CBG:  Recent Labs Lab 12/04/15 1015 12/04/15 1105 12/04/15 1157 12/04/15 1707 12/04/15 2132  GLUCAP 79 80 105* 353* 301*

## 2015-12-05 NOTE — Progress Notes (Signed)
Patient is being non-compliant this morning and is refusing for the RN to assess him and he is refusing to have his CBG checked. The patient is demanding more food. When the RN attempted to explain to the patient about his diet restrictions he called the RN "white trash" and asked the RN to leave the room. RN continued to try and calm patient down and asked him to listen while she explained why he had restrictions, but the patient was irate and verbally abusive and would not listen to the staff. Patient again told the RN to leave the room, so the RN left the room. The RN took with her the patient's breakfast tray of which, the patient ate 100% of his meal. The MD has been notified of the patient's behavior. Will continue to monitor patient and offer care as the patient allows.   Arta BruceDeutsch, Lulubelle Simcoe [facility] 12/05/2015 8:08 AM

## 2015-12-05 NOTE — Progress Notes (Signed)
Patient is refusing all care and education this AM. He is refusing to allow the bedside RN and the Charge Nurse to review his discharge instructions and medications with him. He is yelling at staff to get out and leave him alone. The patient is discharged per MD orders. The Charge Nurse had to go in and remove the patient's IV as he was refusing for the bedside RN to remove it. He refused to take any medications this AM. Charge Nurse is getting patient dressed and making sure he is escorted out of the hospital.  Arta BruceDeutsch, Evalene Vath [facility]ies[facility] 12/05/2015 9:25 AM

## 2015-12-05 NOTE — Plan of Care (Signed)
Problem: Education: Goal: Knowledge of [facility] Education information/materials will improve Outcome: Not Met (add Reason) Patient is refusing all care and education this am. He is irate and yelling at staff to leave him alone.   Problem: Safety: Goal: Ability to remain free from injury will improve Outcome: Not Met (add Reason) Patient is refusing all care and education this am. He is irate and yelling at staff to leave him alone.   Problem: Health Behavior/Discharge Planning: Goal: Ability to manage health-related needs will improve Outcome: Not Met (add Reason) Patient is refusing all care and education this am. He is irate and yelling at staff to leave him alone.   Problem: Pain Managment: Goal: General experience of comfort will improve Outcome: Not Applicable Date Met:  23/55/73 Patient is refusing all care and education this am. He is irate and yelling at staff to leave him alone.   Problem: Physical Regulation: Goal: Ability to maintain clinical measurements within normal limits will improve Outcome: Not Met (add Reason) Patient is refusing all care and education this am. He is irate and yelling at staff to leave him alone.  Goal: Will remain free from infection Outcome: Not Met (add Reason) Patient is refusing all care and education this am. He is irate and yelling at staff to leave him alone.

## 2015-12-05 NOTE — Progress Notes (Signed)
CSW had received referral for homeless issues.   Pt discharged prior to CSW being able to assess pt.   Per RN note, pt refusing care and education this morning and was discharged per MD orders.   Loletta SpecterSuzanna Turner Kunzman, MSW, LCSW Clinical Social Work [phone]

## 2015-12-06 ENCOUNTER — Encounter (HOSPITAL_COMMUNITY): Payer: Self-pay | Admitting: *Deleted

## 2015-12-06 ENCOUNTER — Observation Stay (HOSPITAL_COMMUNITY): Payer: Medicaid Other

## 2015-12-06 ENCOUNTER — Inpatient Hospital Stay (HOSPITAL_COMMUNITY)
Admission: EM | Admit: 2015-12-06 | Discharge: 2015-12-07 | DRG: 638 | Payer: Medicaid Other | Attending: Internal Medicine | Admitting: Internal Medicine

## 2015-12-06 DIAGNOSIS — N179 Acute kidney failure, unspecified: Secondary | ICD-10-CM | POA: Diagnosis present

## 2015-12-06 DIAGNOSIS — F1010 Alcohol abuse, uncomplicated: Secondary | ICD-10-CM | POA: Diagnosis not present

## 2015-12-06 DIAGNOSIS — R739 Hyperglycemia, unspecified: Secondary | ICD-10-CM | POA: Diagnosis not present

## 2015-12-06 DIAGNOSIS — N183 Chronic kidney disease, stage 3 (moderate): Secondary | ICD-10-CM

## 2015-12-06 DIAGNOSIS — F1410 Cocaine abuse, uncomplicated: Secondary | ICD-10-CM | POA: Diagnosis not present

## 2015-12-06 DIAGNOSIS — I5033 Acute on chronic diastolic (congestive) heart failure: Secondary | ICD-10-CM | POA: Diagnosis present

## 2015-12-06 DIAGNOSIS — R05 Cough: Secondary | ICD-10-CM

## 2015-12-06 DIAGNOSIS — R531 Weakness: Secondary | ICD-10-CM | POA: Diagnosis not present

## 2015-12-06 DIAGNOSIS — I130 Hypertensive heart and chronic kidney disease with heart failure and stage 1 through stage 4 chronic kidney disease, or unspecified chronic kidney disease: Secondary | ICD-10-CM | POA: Diagnosis present

## 2015-12-06 DIAGNOSIS — I5032 Chronic diastolic (congestive) heart failure: Secondary | ICD-10-CM | POA: Diagnosis present

## 2015-12-06 DIAGNOSIS — Z823 Family history of stroke: Secondary | ICD-10-CM | POA: Diagnosis not present

## 2015-12-06 DIAGNOSIS — I10 Essential (primary) hypertension: Secondary | ICD-10-CM | POA: Diagnosis present

## 2015-12-06 DIAGNOSIS — F17210 Nicotine dependence, cigarettes, uncomplicated: Secondary | ICD-10-CM | POA: Diagnosis present

## 2015-12-06 DIAGNOSIS — R059 Cough, unspecified: Secondary | ICD-10-CM

## 2015-12-06 DIAGNOSIS — Z833 Family history of diabetes mellitus: Secondary | ICD-10-CM | POA: Diagnosis not present

## 2015-12-06 DIAGNOSIS — D649 Anemia, unspecified: Secondary | ICD-10-CM | POA: Diagnosis present

## 2015-12-06 DIAGNOSIS — J189 Pneumonia, unspecified organism: Secondary | ICD-10-CM

## 2015-12-06 DIAGNOSIS — N189 Chronic kidney disease, unspecified: Secondary | ICD-10-CM

## 2015-12-06 DIAGNOSIS — R9389 Abnormal findings on diagnostic imaging of other specified body structures: Secondary | ICD-10-CM

## 2015-12-06 DIAGNOSIS — E1122 Type 2 diabetes mellitus with diabetic chronic kidney disease: Secondary | ICD-10-CM | POA: Diagnosis present

## 2015-12-06 DIAGNOSIS — I160 Hypertensive urgency: Secondary | ICD-10-CM

## 2015-12-06 DIAGNOSIS — Z9114 Patient's other noncompliance with medication regimen: Secondary | ICD-10-CM

## 2015-12-06 DIAGNOSIS — E1165 Type 2 diabetes mellitus with hyperglycemia: Principal | ICD-10-CM | POA: Diagnosis present

## 2015-12-06 DIAGNOSIS — F1910 Other psychoactive substance abuse, uncomplicated: Secondary | ICD-10-CM | POA: Diagnosis present

## 2015-12-06 DIAGNOSIS — Z7984 Long term (current) use of oral hypoglycemic drugs: Secondary | ICD-10-CM | POA: Diagnosis not present

## 2015-12-06 HISTORY — DX: Major depressive disorder, single episode, unspecified: F32.9

## 2015-12-06 HISTORY — DX: Pneumonia, unspecified organism: J18.9

## 2015-12-06 HISTORY — DX: Acute embolism and thrombosis of unspecified deep veins of unspecified lower extremity: I82.409

## 2015-12-06 HISTORY — DX: Type 2 diabetes mellitus without complications: E11.9

## 2015-12-06 HISTORY — DX: Gastro-esophageal reflux disease without esophagitis: K21.9

## 2015-12-06 HISTORY — DX: Anemia, unspecified: D64.9

## 2015-12-06 HISTORY — DX: Essential (primary) hypertension: I10

## 2015-12-06 HISTORY — DX: Migraine, unspecified, not intractable, without status migrainosus: G43.909

## 2015-12-06 HISTORY — DX: Chronic kidney disease, stage 3 unspecified: N18.30

## 2015-12-06 HISTORY — DX: Chronic kidney disease, stage 3 (moderate): N18.3

## 2015-12-06 HISTORY — DX: Depression, unspecified: F32.A

## 2015-12-06 LAB — CBC
HCT: 24 % — ABNORMAL LOW (ref 39.0–52.0)
Hemoglobin: 8.3 g/dL — ABNORMAL LOW (ref 13.0–17.0)
MCH: 30.2 pg (ref 26.0–34.0)
MCHC: 34.6 g/dL (ref 30.0–36.0)
MCV: 87.3 fL (ref 78.0–100.0)
PLATELETS: 180 10*3/uL (ref 150–400)
RBC: 2.75 MIL/uL — ABNORMAL LOW (ref 4.22–5.81)
RDW: 14.3 % (ref 11.5–15.5)
WBC: 5.3 10*3/uL (ref 4.0–10.5)

## 2015-12-06 LAB — BASIC METABOLIC PANEL
ANION GAP: 10 (ref 5–15)
BUN: 42 mg/dL — ABNORMAL HIGH (ref 6–20)
CHLORIDE: 101 mmol/L (ref 101–111)
CO2: 26 mmol/L (ref 22–32)
Calcium: 8.3 mg/dL — ABNORMAL LOW (ref 8.9–10.3)
Creatinine, Ser: 2.04 mg/dL — ABNORMAL HIGH (ref 0.61–1.24)
GFR calc Af Amer: 39 mL/min — ABNORMAL LOW (ref >60)
GFR, EST NON AFRICAN AMERICAN: 33 mL/min — AB (ref >60)
Glucose, Bld: 208 mg/dL — ABNORMAL HIGH (ref 65–99)
POTASSIUM: 4 mmol/L (ref 3.5–5.1)
SODIUM: 137 mmol/L (ref 135–145)

## 2015-12-06 LAB — COMPREHENSIVE METABOLIC PANEL WITH GFR
ALT: 28 U/L (ref 17–63)
AST: 30 U/L (ref 15–41)
Albumin: 2.6 g/dL — ABNORMAL LOW (ref 3.5–5.0)
Alkaline Phosphatase: 160 U/L — ABNORMAL HIGH (ref 38–126)
Anion gap: 8 (ref 5–15)
BUN: 43 mg/dL — ABNORMAL HIGH (ref 6–20)
CO2: 26 mmol/L (ref 22–32)
Calcium: 8.6 mg/dL — ABNORMAL LOW (ref 8.9–10.3)
Chloride: 101 mmol/L (ref 101–111)
Creatinine, Ser: 1.97 mg/dL — ABNORMAL HIGH (ref 0.61–1.24)
GFR calc Af Amer: 40 mL/min — ABNORMAL LOW
GFR calc non Af Amer: 35 mL/min — ABNORMAL LOW
Glucose, Bld: 594 mg/dL (ref 65–99)
Potassium: 4.3 mmol/L (ref 3.5–5.1)
Sodium: 135 mmol/L (ref 135–145)
Total Bilirubin: 0.7 mg/dL (ref 0.3–1.2)
Total Protein: 6.1 g/dL — ABNORMAL LOW (ref 6.5–8.1)

## 2015-12-06 LAB — GLUCOSE, CAPILLARY
GLUCOSE-CAPILLARY: 223 mg/dL — AB (ref 65–99)
GLUCOSE-CAPILLARY: 132 mg/dL — AB (ref 65–99)
GLUCOSE-CAPILLARY: 206 mg/dL — AB (ref 65–99)
Glucose-Capillary: 205 mg/dL — ABNORMAL HIGH (ref 65–99)
Glucose-Capillary: 225 mg/dL — ABNORMAL HIGH (ref 65–99)
Glucose-Capillary: 158 mg/dL — ABNORMAL HIGH (ref 65–99)

## 2015-12-06 LAB — CBC WITH DIFFERENTIAL/PLATELET
Basophils Absolute: 0 K/uL (ref 0.0–0.1)
Basophils Relative: 0 %
Eosinophils Absolute: 0.1 K/uL (ref 0.0–0.7)
Eosinophils Relative: 2 %
HCT: 27 % — ABNORMAL LOW (ref 39.0–52.0)
Hemoglobin: 9 g/dL — ABNORMAL LOW (ref 13.0–17.0)
Lymphocytes Relative: 15 %
Lymphs Abs: 0.7 K/uL (ref 0.7–4.0)
MCH: 28.9 pg (ref 26.0–34.0)
MCHC: 33.3 g/dL (ref 30.0–36.0)
MCV: 86.8 fL (ref 78.0–100.0)
Monocytes Absolute: 0.3 K/uL (ref 0.1–1.0)
Monocytes Relative: 6 %
Neutro Abs: 3.8 K/uL (ref 1.7–7.7)
Neutrophils Relative %: 77 %
Platelets: 196 K/uL (ref 150–400)
RBC: 3.11 MIL/uL — ABNORMAL LOW (ref 4.22–5.81)
RDW: 14 % (ref 11.5–15.5)
WBC: 5 K/uL (ref 4.0–10.5)

## 2015-12-06 LAB — I-STAT ARTERIAL BLOOD GAS, ED
Acid-Base Excess: 2 mmol/L (ref 0.0–2.0)
Bicarbonate: 26.2 mEq/L — ABNORMAL HIGH (ref 20.0–24.0)
O2 SAT: 95 %
PH ART: 7.436 (ref 7.350–7.450)
Patient temperature: 98.6
TCO2: 27 mmol/L (ref 0–100)
pCO2 arterial: 38.9 mmHg (ref 35.0–45.0)
pO2, Arterial: 74 mmHg — ABNORMAL LOW (ref 80.0–100.0)

## 2015-12-06 LAB — BLOOD GAS, VENOUS

## 2015-12-06 LAB — CBG MONITORING, ED
GLUCOSE-CAPILLARY: 335 mg/dL — AB (ref 65–99)
Glucose-Capillary: 541 mg/dL — ABNORMAL HIGH (ref 65–99)
Glucose-Capillary: 513 mg/dL — ABNORMAL HIGH (ref 65–99)
Glucose-Capillary: 460 mg/dL — ABNORMAL HIGH (ref 65–99)

## 2015-12-06 LAB — I-STAT VENOUS BLOOD GAS, ED
Acid-Base Excess: 4 mmol/L — ABNORMAL HIGH (ref 0.0–2.0)
BICARBONATE: 29.1 meq/L — AB (ref 20.0–24.0)
O2 SAT: 74 %
PCO2 VEN: 42.9 mmHg — AB (ref 45.0–50.0)
TCO2: 30 mmol/L (ref 0–100)
pH, Ven: 7.44 — ABNORMAL HIGH (ref 7.250–7.300)
pO2, Ven: 38 mmHg (ref 30.0–45.0)

## 2015-12-06 LAB — URINALYSIS, ROUTINE W REFLEX MICROSCOPIC
Bilirubin Urine: NEGATIVE
Glucose, UA: >1000 mg/dL — AB
Ketones, ur: NEGATIVE mg/dL
Leukocytes, UA: NEGATIVE
Nitrite: NEGATIVE
Protein, ur: >300 mg/dL — AB
Specific Gravity, Urine: 1.025 (ref 1.005–1.030)
pH: 7 (ref 5.0–8.0)

## 2015-12-06 LAB — URINE MICROSCOPIC-ADD ON: Squamous Epithelial / HPF: NONE SEEN

## 2015-12-06 LAB — LIPASE, BLOOD: Lipase: 23 U/L (ref 11–51)

## 2015-12-06 LAB — TSH: TSH: 0.523 u[IU]/mL (ref 0.350–4.500)

## 2015-12-06 MED ORDER — ACETAMINOPHEN 325 MG PO TABS: 650.0000 mg | ORAL_TABLET | Freq: Four times a day (QID) | ORAL | Status: DC | PRN

## 2015-12-06 MED ORDER — KETOROLAC TROMETHAMINE 30 MG/ML IJ SOLN
30.0000 mg | Freq: Once | INTRAMUSCULAR | Status: AC
  Administered 2015-12-06: 30 mg via INTRAVENOUS
  Filled 2015-12-06: qty 1

## 2015-12-06 MED ORDER — SODIUM CHLORIDE 0.9 % IV BOLUS (SEPSIS)
1000.0000 mL | Freq: Once | INTRAVENOUS | Status: AC
  Administered 2015-12-06: 1000 mL via INTRAVENOUS

## 2015-12-06 MED ORDER — SODIUM CHLORIDE 0.9 % IV SOLN
INTRAVENOUS | Status: DC
  Administered 2015-12-06: 4.5 [IU]/h via INTRAVENOUS
  Administered 2015-12-06: 3.2 [IU]/h via INTRAVENOUS
  Filled 2015-12-06: qty 2.5

## 2015-12-06 MED ORDER — HEPARIN SODIUM (PORCINE) 5000 UNIT/ML IJ SOLN
5000.0000 [IU] | Freq: Three times a day (TID) | INTRAMUSCULAR | Status: DC
  Administered 2015-12-06 – 2015-12-07 (×2): 5000 [IU] via SUBCUTANEOUS
  Filled 2015-12-06 (×2): qty 1

## 2015-12-06 MED ORDER — DEXTROSE-NACL 5-0.45 % IV SOLN
INTRAVENOUS | Status: DC
  Administered 2015-12-06: 16:00:00 via INTRAVENOUS

## 2015-12-06 MED ORDER — DILTIAZEM HCL ER 60 MG PO CP12
120.0000 mg | ORAL_CAPSULE | Freq: Two times a day (BID) | ORAL | Status: DC
  Administered 2015-12-07: 120 mg via ORAL
  Filled 2015-12-06 (×3): qty 2

## 2015-12-06 MED ORDER — HYDRALAZINE HCL 25 MG PO TABS
25.0000 mg | ORAL_TABLET | Freq: Four times a day (QID) | ORAL | Status: DC
  Administered 2015-12-06 – 2015-12-07 (×2): 25 mg via ORAL
  Filled 2015-12-06 (×3): qty 1

## 2015-12-06 MED ORDER — ACETAMINOPHEN 650 MG RE SUPP: 650.0000 mg | Freq: Four times a day (QID) | RECTAL | Status: DC | PRN

## 2015-12-06 MED ORDER — ONDANSETRON HCL 4 MG/2ML IJ SOLN
4.0000 mg | Freq: Once | INTRAMUSCULAR | Status: AC
  Administered 2015-12-06: 4 mg via INTRAVENOUS
  Filled 2015-12-06: qty 2

## 2015-12-06 MED ORDER — HYDROCODONE-ACETAMINOPHEN 5-325 MG PO TABS: 1.0000 | ORAL_TABLET | ORAL | Status: DC | PRN

## 2015-12-06 MED ORDER — ONDANSETRON HCL 4 MG/2ML IJ SOLN: 4.0000 mg | Freq: Four times a day (QID) | INTRAMUSCULAR | Status: DC | PRN

## 2015-12-06 MED ORDER — ONDANSETRON HCL 4 MG PO TABS: 4.0000 mg | ORAL_TABLET | Freq: Four times a day (QID) | ORAL | Status: DC | PRN

## 2015-12-06 NOTE — ED Notes (Signed)
Pt states that he has the flu. [facility] for [facility] same. Pt reports abdominal pain. [employer] in triage with pt due to not being cooperative.

## 2015-12-06 NOTE — ED Notes (Signed)
Pt cbg 335 

## 2015-12-06 NOTE — ED Provider Notes (Signed)
CSN: [id number]     Arrival date & time 12/06/15  [id number] History   First MD Initiated Contact with Patient 12/06/15 [phone]     Chief Complaint  Patient presents with  . Emesis     (Consider location/radiation/quality/duration/timing/severity/associated sxs/prior Treatment) HPI  Clayton [name] is a 64 y.o M with a pmhx of DM, severe non-compliance, multiple trips to the ED for hyperglycemia, homelessness who presents to the emergency department today complaining of abdominal pain, vomiting, diarrhea. Denies fever, chills, chest pain, shortness of breath, cough, confusion. Patient was admitted to the hospital 2 days ago when he presented with similar symptoms. At that time his blood sugar was 800. He was not in DKA however. Patient was admitted for hyperglycemia, hypertension and HCAP rule out. However, when patient was on the floor he was extremely abusive towards all staff and refused any intervention. Patient was discharged yesterday as the hospitalist team felt that there was nothing further they could do for him if he was not going to let them treat him. Patient refuses to take his home insulin or see primary care.   History reviewed. No pertinent past medical history. History reviewed. No pertinent past surgical history. Family History  Problem Relation Age of Onset  . Diabetes Mellitus II Mother   . Diabetes Mellitus II Father   . Stroke Maternal Grandfather    Social History  Substance Use Topics  . Smoking status: Current Every Day Smoker -- 0.50 packs/day    Types: Cigarettes  . Smokeless tobacco: None  . Alcohol Use: Yes    Review of Systems  All other systems reviewed and are negative.     Allergies  Latex  Home Medications   Prior to Admission medications   Medication Sig Start Date End Date Taking? Authorizing Provider  diltiazem (CARDIZEM SR) 120 MG 12 hr capsule Take 1 capsule (120 mg total) by mouth every 12 (twelve) hours. 12/05/15  Yes Alison MurrayAlma M Devine, MD   folic acid (FOLVITE) 1 MG tablet Take 1 tablet (1 mg total) by mouth daily. Patient not taking: Reported on 11/24/2015 11/01/15   Richarda OverlieNayana Abrol, MD  glyBURIDE (DIABETA) 5 MG tablet Take 2 tablets (10 mg total) by mouth 2 (two) times daily with a meal. 12/05/15   Alison MurrayAlma M Devine, MD  hydrALAZINE (APRESOLINE) 25 MG tablet Take 1 tablet (25 mg total) by mouth every 6 (six) hours. 12/05/15   Alison MurrayAlma M Devine, MD   BP 210/118 mmHg  Pulse 92  Temp(Src) 97.8 F (36.6 C) (Oral)  Resp 22  SpO2 98% Physical Exam  Constitutional: He is oriented to person, place, and time. He appears well-developed and well-nourished. No distress.  HENT:  Head: Normocephalic and atraumatic.  Mouth/Throat: No oropharyngeal exudate.  Eyes: Conjunctivae and EOM are normal. Pupils are equal, round, and reactive to light. Right eye exhibits no discharge. Left eye exhibits no discharge. No scleral icterus.  Neck: Normal range of motion. Neck supple. No JVD present.  Cardiovascular: Normal rate, regular rhythm, normal heart sounds and intact distal pulses.  Exam reveals no gallop and no friction rub.   No murmur heard. Pulmonary/Chest: Effort normal and breath sounds normal. No respiratory distress. He has no wheezes. He has no rales. He exhibits no tenderness.  Abdominal: Soft. Bowel sounds are normal. He exhibits no distension. There is tenderness ( epigastric TTP). There is no guarding.  Musculoskeletal: Normal range of motion. He exhibits no edema.  Lymphadenopathy:    He has no cervical adenopathy.  Neurological: He is alert and oriented to person, place, and time. No cranial nerve deficit.  Strength 5/5 throughout. No sensory deficits.  No gait abnormality.  Skin: Skin is warm and dry. No rash noted. He is not diaphoretic. No erythema. No pallor.  Nursing note and vitals reviewed.   ED Course  Procedures (including critical care time) Labs Review Labs Reviewed  COMPREHENSIVE METABOLIC PANEL - Abnormal; Notable for  the following:    Glucose, Bld 594 (*)    BUN 43 (*)    Creatinine, Ser 1.97 (*)    Calcium 8.6 (*)    Total Protein 6.1 (*)    Albumin 2.6 (*)    Alkaline Phosphatase 160 (*)    GFR calc non Af Amer 35 (*)    GFR calc Af Amer 40 (*)    All other components within normal limits  CBC WITH DIFFERENTIAL/PLATELET - Abnormal; Notable for the following:    RBC 3.11 (*)    Hemoglobin 9.0 (*)    HCT 27.0 (*)    All other components within normal limits  URINALYSIS, ROUTINE W REFLEX MICROSCOPIC (NOT AT [facility]) - Abnormal; Notable for the following:    Glucose, UA >1000 (*)    Hgb urine dipstick MODERATE (*)    Protein, ur >300 (*)    All other components within normal limits  URINE MICROSCOPIC-ADD ON - Abnormal; Notable for the following:    Bacteria, UA RARE (*)    All other components within normal limits  BASIC METABOLIC PANEL - Abnormal; Notable for the following:    Glucose, Bld 208 (*)    BUN 42 (*)    Creatinine, Ser 2.04 (*)    Calcium 8.3 (*)    GFR calc non Af Amer 33 (*)    GFR calc Af Amer 39 (*)    All other components within normal limits  CBC - Abnormal; Notable for the following:    RBC 2.75 (*)    Hemoglobin 8.3 (*)    HCT 24.0 (*)    All other components within normal limits  GLUCOSE, CAPILLARY - Abnormal; Notable for the following:    Glucose-Capillary 223 (*)    All other components within normal limits  GLUCOSE, CAPILLARY - Abnormal; Notable for the following:    Glucose-Capillary 132 (*)    All other components within normal limits  GLUCOSE, CAPILLARY - Abnormal; Notable for the following:    Glucose-Capillary 205 (*)    All other components within normal limits  GLUCOSE, CAPILLARY - Abnormal; Notable for the following:    Glucose-Capillary 225 (*)    All other components within normal limits  GLUCOSE, CAPILLARY - Abnormal; Notable for the following:    Glucose-Capillary 206 (*)    All other components within normal limits  GLUCOSE, CAPILLARY - Abnormal;  Notable for the following:    Glucose-Capillary 158 (*)    All other components within normal limits  GLUCOSE, CAPILLARY - Abnormal; Notable for the following:    Glucose-Capillary 163 (*)    All other components within normal limits  GLUCOSE, CAPILLARY - Abnormal; Notable for the following:    Glucose-Capillary 162 (*)    All other components within normal limits  GLUCOSE, CAPILLARY - Abnormal; Notable for the following:    Glucose-Capillary 164 (*)    All other components within normal limits  GLUCOSE, CAPILLARY - Abnormal; Notable for the following:    Glucose-Capillary 135 (*)    All other components within normal limits  GLUCOSE,  CAPILLARY - Abnormal; Notable for the following:    Glucose-Capillary 125 (*)    All other components within normal limits  CBG MONITORING, ED - Abnormal; Notable for the following:    Glucose-Capillary 541 (*)    All other components within normal limits  I-STAT ARTERIAL BLOOD GAS, ED - Abnormal; Notable for the following:    pO2, Arterial 74.0 (*)    Bicarbonate 26.2 (*)    All other components within normal limits  I-STAT VENOUS BLOOD GAS, ED - Abnormal; Notable for the following:    pH, Ven 7.440 (*)    pCO2, Ven 42.9 (*)    Bicarbonate 29.1 (*)    Acid-Base Excess 4.0 (*)    All other components within normal limits  CBG MONITORING, ED - Abnormal; Notable for the following:    Glucose-Capillary 513 (*)    All other components within normal limits  CBG MONITORING, ED - Abnormal; Notable for the following:    Glucose-Capillary 460 (*)    All other components within normal limits  CBG MONITORING, ED - Abnormal; Notable for the following:    Glucose-Capillary 335 (*)    All other components within normal limits  MRSA PCR SCREENING  LIPASE, BLOOD  BLOOD GAS, VENOUS  TSH    Imaging Review No results found. I have personally reviewed and evaluated these images and lab results as part of my medical decision-making.   EKG  Interpretation None      MDM   Final diagnoses:  Hyperglycemia  Hypertensive urgency    65 y.o M with hx of uncontrolled DM due to poor compliance presents to the ED with vomiting and diarrhea. Pt here with hyperglycemia, blood sugar 541, and hypertension. Suspect this is secondary to medication noncompliance. Pt given IV fluids, zofran, and protonix and was placed on a glucose stabilizer protocol. No signs of DKA. No ketonuria. ABG wnl. No anion gap. Pt was admitted to the hospital on 12/26 for the same problems and was discharged from the hospital yesterday as pt refused treatment while he was on the floor. Pt now requesting treatment. As pt still has the same issues he did when he left the hospital yesterday, recommend readmission. Spoke with the hospitalist who will admit pt to their service.     Lester Kinsman [place], PA-C 12/08/15 1753  Lyndal Pulley, MD 12/08/15 2138

## 2015-12-06 NOTE — ED Notes (Signed)
Pt cbg 460

## 2015-12-06 NOTE — H&P (Signed)
[facility] History and Physical  Clayton [name] ZOX:[id number] DOB: Apr 13, 1951 DOA: 12/06/2015  Referring physician: tripp PCP: No PCP Per Patient   Chief Complaint: emesis  HPI: Clayton [name] is a 64 y.o. male past medical history that includes diabetes, chronic kidney disease stage III, anemia, hypertension since emergency Department chief complaint of generalized weakness nausea and vomiting. Initial evaluation in the emergency department reveals hyperglycemia with a glucose 594, on chronic kidney disease, anemia.  Patient reports he left the hospital 2 days ago for insisting upon being discharged. Since that time he has had persistent nausea 2 episodes of emesis generalized weakness and intermittent cough. He reports not having finances to manage his diabetes or obtain his home medications. He denies chest pain palpitations headache syncope or near-syncope. He denies any abdominal pain diarrhea constipation dysuria hematuria frequency or urgency.  In the emergency department he is afebrile hemodynamically stable with a blood pressure at the high end of normal he is not hypoxic. Started on insulin drip.   Review of Systems: 10 point review of systems complete and all systems are negative except as indicated in the history of present illness History reviewed. No pertinent past medical history. History reviewed. No pertinent past surgical history. Social History:  reports that he has been smoking Cigarettes.  He has been smoking about 0.50 packs per day. He does not have any smokeless tobacco history on file. He reports that he drinks alcohol. He reports that he does not use illicit drugs. Said he did crack 2 months ago Allergies  Allergen Reactions  . Latex Rash    Family History  Problem Relation Age of Onset  . Diabetes Mellitus II Mother   . Diabetes Mellitus II Father   . Stroke Maternal Grandfather      Prior to Admission medications   Medication Sig Start Date  End Date Taking? Authorizing Provider  diltiazem (CARDIZEM SR) 120 MG 12 hr capsule Take 1 capsule (120 mg total) by mouth every 12 (twelve) hours. 12/05/15  Yes Alison Murray, MD  glyBURIDE (DIABETA) 5 MG tablet Take 2 tablets (10 mg total) by mouth 2 (two) times daily with a meal. 12/05/15   Alison Murray, MD  hydrALAZINE (APRESOLINE) 25 MG tablet Take 1 tablet (25 mg total) by mouth every 6 (six) hours. 12/05/15   Alison Murray, MD   Physical Exam: Filed Vitals:   12/06/15 1330 12/06/15 1400 12/06/15 1444 12/06/15 1547  BP: 170/96 164/95 160/92 166/92  Pulse: 84 87 83 83  Temp:      TempSrc:      Resp: SpO2: 100% 99% 100% 99%    Wt Readings from Last 3 Encounters:  12/04/15 78.3 kg (172 lb 9.9 oz)  11/01/15 77.157 kg (170 lb 1.6 oz)  10/10/15 80.831 kg (178 lb 3.2 oz)    General:  Appears only slightly irritable and comfortable Eyes: PERRL, normal lids, irises & conjunctiva ENT: grossly normal hearing, lips & tongue Neck: no LAD, masses or thyromegaly Cardiovascular: RRR, no m/r/g. Trace edema. Tonic venous stasis changes Telemetry: SR, no arrhythmias  Respiratory: CTA bilaterally, no w/r/r. Normal respiratory effort. Sounds somewhat distant Abdomen: soft, ntnd Skin: no rash or induration seen on limited exam Musculoskeletal: grossly normal tone BUE/BLE Psychiatric: grossly normal mood and affect, speech fluent and appropriate Neurologic: grossly non-focal.          Labs on Admission:  Basic Metabolic Panel:  Recent Labs Lab 12/04/15 0247 12/04/15  1123 12/06/15 1156  NA 127* 137 135  K 4.6 4.0 4.3  CL 94* 105 101  CO2 26 25 26   GLUCOSE 800* 99 594*  BUN 41* 38* 43*  CREATININE 1.93* 1.80* 1.97*  CALCIUM 8.4* 8.0* 8.6*   Liver Function Tests:  Recent Labs Lab 12/04/15 0247 12/06/15 1156  AST 30 30  ALT 29 28  ALKPHOS 160* 160*  BILITOT 0.8 0.7  PROT 5.7* 6.1*  ALBUMIN 2.5* 2.6*    Recent Labs Lab 12/06/15 1156  LIPASE 23   No  results for input(s): AMMONIA in the last 168 hours. CBC:  Recent Labs Lab 12/04/15 0247 12/06/15 1156  WBC 4.5 5.0  NEUTROABS 2.8 3.8  HGB 10.5* 9.0*  HCT 31.3* 27.0*  MCV 89.7 86.8  PLT 158 196   Cardiac Enzymes: No results for input(s): CKTOTAL, CKMB, CKMBINDEX, TROPONINI in the last 168 hours.  BNP (last 3 results)  Recent Labs  08/17/15 0330 12/04/15 0422  BNP 303.5* 557.6*    ProBNP (last 3 results) No results for input(s): PROBNP in the last 8760 hours.  CBG:  Recent Labs Lab 12/04/15 2132 12/06/15 1002 12/06/15 1210 12/06/15 1330 12/06/15 1444  GLUCAP 301* 541* 513* 460* 335*    Radiological Exams on Admission: Dg Chest 2 View  12/06/2015  CLINICAL DATA:  Cough. EXAM: CHEST  2 VIEW COMPARISON:  12/04/2015 and 10/31/2015 FINDINGS: There is prominent peribronchial thickening. On the lateral view there is a small patchy infiltrate posteriorly and inferiorly. Heart size and pulmonary vascularity are normal. No osseous abnormality. IMPRESSION: Small patchy infiltrate at 1 of the lung bases posteriorly and inferiorly. Prominent bronchitic changes. Electronically Signed   By: Francene BoyersJames  Maxwell M.D.   On: 12/06/2015 14:39    EKG: Independently reviewed. SR rhythm left bundle branch block  Assessment/Plan Principal Problem:   Hyperglycemia Active Problems:   Noncompliance with medications   Alcohol abuse   Acute renal failure superimposed on stage 3 chronic kidney disease (HCC)   Essential hypertension   Chronic diastolic CHF (congestive heart failure) (HCC)   Abnormal chest x-ray  #1. Hyperglycemia and uncontrolled diabetes. No anion gap no ketones in urine -Admit to step down and continue insulin drip that was initiated in the emergency department -She reports not being on insulin for many months due to finances -Bmet 6 PM -Transition to sliding scale when able -changed oral agents due to noncompliance with insulin and past  #2. Hypertension.  Uncontrolled in the emergency department. Has not taken home medications -Resume home Cardizem - Resume home Apresoline  3. Acute on chronic renal failure. Stage III. - related to noncompliance -Gentle IV fluids -Hold nephrotoxins  4. Right diastolic heart failure -appears euvolemic -continue cardizem -not on BB or ace -long hx non-compliance and financial challenges  5. Abnormal chest x-ray. Chest x-ray on admission : 12/26 showed peribronchial thickening with possible mild pneumonia. Chest x-ray today reveals Small patchy infiltrate at 1 of the lung bases posteriorly and inferiorly. Prominent bronchitic changes. Patient is afebrile nontoxic appearing no leukocytosis. Looked at the x-rays today's x-ray looks better. Appears to be chronic bronchiectatic changes -No antibiotics indicated at this time    cardiology  Code Status: full DVT Prophylaxis: Family Communication: none Disposition Plan: home  Time spent: 75 minutes  [facility] CenterBLACK,KAREN M [facility]

## 2015-12-06 NOTE — Progress Notes (Signed)
Received patient from ED per stretcher.  He is non-compliant.  Refused to take his Hydralazine and diltiazem.  Dr. Konrad DoloresMerrell was paged and inquired as to patient not meeting the stepdown criteria.  His vital signs is stable.  He is only here for insulin drip.  He stated that to his knowledge and per his PA, patient on insulin drip has to be admitted to stepdown.  I [facility] this [facility], Chrissie NoaWilliam.    Unable to do admission due to patient refusing to talk and answer any questions.  Dr. Konrad DoloresMerrell is made aware of this matter during his visit to the floor.  Report given to oncoming shift that patient is uncooperative.

## 2015-12-06 NOTE — ED Notes (Signed)
Pt still refusing IV start at this time.

## 2015-12-06 NOTE — ED Notes (Signed)
Pt making rude and derogatory marks towards this RN, slamming blankets and swinging his arms. [employer] notified and to bedside.

## 2015-12-06 NOTE — ED Notes (Signed)
Attempted IV start and lab draw X2. Will have phlebotomist try.

## 2015-12-06 NOTE — ED Notes (Signed)
Pt refusing labs, reports we can get labs in 30 minutes.

## 2015-12-06 NOTE — ED Notes (Signed)
Phlebotomy at bedside.

## 2015-12-06 NOTE — ED Notes (Signed)
Report given to TaTa, 2C RN. RN to call admitting MD to ensure pt is appropriate for step down.

## 2015-12-07 DIAGNOSIS — F1410 Cocaine abuse, uncomplicated: Secondary | ICD-10-CM

## 2015-12-07 DIAGNOSIS — Z9114 Patient's other noncompliance with medication regimen: Secondary | ICD-10-CM

## 2015-12-07 DIAGNOSIS — F1010 Alcohol abuse, uncomplicated: Secondary | ICD-10-CM

## 2015-12-07 DIAGNOSIS — R739 Hyperglycemia, unspecified: Secondary | ICD-10-CM

## 2015-12-07 LAB — GLUCOSE, CAPILLARY
GLUCOSE-CAPILLARY: 125 mg/dL — AB (ref 65–99)
GLUCOSE-CAPILLARY: 162 mg/dL — AB (ref 65–99)
GLUCOSE-CAPILLARY: 163 mg/dL — AB (ref 65–99)
Glucose-Capillary: 164 mg/dL — ABNORMAL HIGH (ref 65–99)
Glucose-Capillary: 135 mg/dL — ABNORMAL HIGH (ref 65–99)

## 2015-12-07 LAB — MRSA PCR SCREENING: MRSA by PCR: NEGATIVE

## 2015-12-07 MED ORDER — INSULIN GLARGINE 100 UNIT/ML ~~LOC~~ SOLN
15.0000 [IU] | Freq: Every day | SUBCUTANEOUS | Status: DC
  Administered 2015-12-07: 15 [IU] via SUBCUTANEOUS
  Filled 2015-12-07 (×2): qty 0.15

## 2015-12-07 MED ORDER — INSULIN ASPART 100 UNIT/ML ~~LOC~~ SOLN
0.0000 [IU] | Freq: Three times a day (TID) | SUBCUTANEOUS | Status: DC
  Administered 2015-12-07 (×2): 2 [IU] via SUBCUTANEOUS

## 2015-12-07 MED ORDER — SODIUM CHLORIDE 0.9 % IV SOLN
INTRAVENOUS | Status: DC
  Administered 2015-12-07: 04:00:00 via INTRAVENOUS

## 2015-12-07 MED ORDER — FUROSEMIDE 10 MG/ML IJ SOLN: 40.0000 mg | Freq: Once | INTRAMUSCULAR | Status: DC
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
## 2015-12-07 NOTE — Progress Notes (Signed)
Utilization Review Completed.Clayton [name] T12/29/2016  
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
## 2015-12-09 LAB — CULTURE, BLOOD (ROUTINE X 2)
CULTURE: NO GROWTH
CULTURE: NO GROWTH

## 2015-12-10 ENCOUNTER — Emergency Department (HOSPITAL_COMMUNITY)
Admission: EM | Admit: 2015-12-10 | Discharge: 2015-12-11 | Disposition: A | Discharge status: Home / Self Care | Payer: Medicaid Other | Source: Home / Self Care | Attending: Emergency Medicine | Admitting: Emergency Medicine

## 2015-12-10 ENCOUNTER — Encounter (HOSPITAL_COMMUNITY): Payer: Self-pay | Admitting: Emergency Medicine

## 2015-12-10 DIAGNOSIS — R739 Hyperglycemia, unspecified: Secondary | ICD-10-CM

## 2015-12-10 DIAGNOSIS — E1121 Type 2 diabetes mellitus with diabetic nephropathy: Secondary | ICD-10-CM | POA: Diagnosis present

## 2015-12-10 DIAGNOSIS — E860 Dehydration: Secondary | ICD-10-CM | POA: Diagnosis present

## 2015-12-10 DIAGNOSIS — N183 Chronic kidney disease, stage 3 (moderate): Secondary | ICD-10-CM | POA: Diagnosis present

## 2015-12-10 DIAGNOSIS — F1010 Alcohol abuse, uncomplicated: Secondary | ICD-10-CM | POA: Diagnosis present

## 2015-12-10 DIAGNOSIS — Z9119 Patient's noncompliance with other medical treatment and regimen: Secondary | ICD-10-CM

## 2015-12-10 DIAGNOSIS — E1165 Type 2 diabetes mellitus with hyperglycemia: Secondary | ICD-10-CM | POA: Diagnosis present

## 2015-12-10 DIAGNOSIS — I129 Hypertensive chronic kidney disease with stage 1 through stage 4 chronic kidney disease, or unspecified chronic kidney disease: Secondary | ICD-10-CM | POA: Diagnosis present

## 2015-12-10 DIAGNOSIS — Z91040 Latex allergy status: Secondary | ICD-10-CM

## 2015-12-10 DIAGNOSIS — F17210 Nicotine dependence, cigarettes, uncomplicated: Secondary | ICD-10-CM | POA: Diagnosis present

## 2015-12-10 DIAGNOSIS — D638 Anemia in other chronic diseases classified elsewhere: Secondary | ICD-10-CM | POA: Diagnosis present

## 2015-12-10 DIAGNOSIS — F1490 Cocaine use, unspecified, uncomplicated: Secondary | ICD-10-CM | POA: Diagnosis present

## 2015-12-10 DIAGNOSIS — J189 Pneumonia, unspecified organism: Principal | ICD-10-CM | POA: Diagnosis present

## 2015-12-10 DIAGNOSIS — I159 Secondary hypertension, unspecified: Secondary | ICD-10-CM

## 2015-12-10 DIAGNOSIS — F329 Major depressive disorder, single episode, unspecified: Secondary | ICD-10-CM | POA: Diagnosis present

## 2015-12-10 DIAGNOSIS — Y95 Nosocomial condition: Secondary | ICD-10-CM | POA: Diagnosis present

## 2015-12-10 DIAGNOSIS — Z9114 Patient's other noncompliance with medication regimen: Secondary | ICD-10-CM

## 2015-12-10 DIAGNOSIS — Z7984 Long term (current) use of oral hypoglycemic drugs: Secondary | ICD-10-CM

## 2015-12-10 DIAGNOSIS — D509 Iron deficiency anemia, unspecified: Secondary | ICD-10-CM | POA: Diagnosis present

## 2015-12-10 DIAGNOSIS — K219 Gastro-esophageal reflux disease without esophagitis: Secondary | ICD-10-CM | POA: Diagnosis present

## 2015-12-10 DIAGNOSIS — Z590 Homelessness: Secondary | ICD-10-CM

## 2015-12-10 DIAGNOSIS — E1122 Type 2 diabetes mellitus with diabetic chronic kidney disease: Secondary | ICD-10-CM | POA: Diagnosis present

## 2015-12-10 LAB — COMPREHENSIVE METABOLIC PANEL
ALBUMIN: 2.2 g/dL — AB (ref 3.5–5.0)
ALK PHOS: 109 U/L (ref 38–126)
ALT: 23 U/L (ref 17–63)
AST: 35 U/L (ref 15–41)
Anion gap: 8 (ref 5–15)
BILIRUBIN TOTAL: 0.6 mg/dL (ref 0.3–1.2)
BUN: 34 mg/dL — AB (ref 6–20)
CO2: 24 mmol/L (ref 22–32)
Calcium: 8.4 mg/dL — ABNORMAL LOW (ref 8.9–10.3)
Chloride: 102 mmol/L (ref 101–111)
Creatinine, Ser: 1.64 mg/dL — ABNORMAL HIGH (ref 0.61–1.24)
GFR calc Af Amer: 50 mL/min — ABNORMAL LOW (ref >60)
GFR calc non Af Amer: 43 mL/min — ABNORMAL LOW (ref >60)
GLUCOSE: 498 mg/dL — AB (ref 65–99)
POTASSIUM: 5.1 mmol/L (ref 3.5–5.1)
SODIUM: 134 mmol/L — AB (ref 135–145)
TOTAL PROTEIN: 5.5 g/dL — AB (ref 6.5–8.1)

## 2015-12-10 LAB — CBC
HEMATOCRIT: 28.5 % — AB (ref 39.0–52.0)
Hemoglobin: 9.3 g/dL — ABNORMAL LOW (ref 13.0–17.0)
MCH: 28.9 pg (ref 26.0–34.0)
MCHC: 32.6 g/dL (ref 30.0–36.0)
MCV: 88.5 fL (ref 78.0–100.0)
Platelets: 224 10*3/uL (ref 150–400)
RBC: 3.22 MIL/uL — ABNORMAL LOW (ref 4.22–5.81)
RDW: 14.4 % (ref 11.5–15.5)
WBC: 6.3 10*3/uL (ref 4.0–10.5)

## 2015-12-10 LAB — LIPASE, BLOOD: Lipase: 17 U/L (ref 11–51)

## 2015-12-10 MED ORDER — ONDANSETRON 4 MG PO TBDP
4.0000 mg | ORAL_TABLET | Freq: Once | ORAL | Status: AC
  Administered 2015-12-11: 4 mg via ORAL
  Filled 2015-12-10: qty 1

## 2015-12-10 MED ORDER — SODIUM CHLORIDE 0.9 % IV BOLUS (SEPSIS): 2000.0000 mL | Freq: Once | INTRAVENOUS | Status: DC

## 2015-12-10 NOTE — ED Provider Notes (Signed)
CSN: [id number]     Arrival date & time 12/10/15  1901 History   By signing my name below, I, Arlan Organ, attest that this documentation has been prepared under the direction and in the presence of Melene Plan, DO.  Electronically Signed: Arlan Organ, ED Scribe. 12/10/2015. 11:25 PM.   Chief Complaint  Patient presents with  . Abdominal Pain   The history is provided by the patient. No language interpreter was used.    HPI Comments: Clayton [name] is a 65 y.o. male with a PMHx of CKD, HTN, and DM who presents to the Emergency Department complaining of constant, ongoing generalized abdominal pain x 3-4 days. No recent injury or trauma to abdomen. Discomfort is exacerbated with pressure. No alleviating factors at this time. Pt also reports nausea, vomiting, and diarrhea. No OTC/prescribed medications attempted prior to arrival. No recent fever or chills. No known sick contacts. Pt denies any previous history of abdominal surgeries.  PCP: PROVIDER NOT IN SYSTEM    Past Medical History  Diagnosis Date  . CKD (chronic kidney disease), stage III   . Anemia   . HTN (hypertension)   . Type II diabetes mellitus (HCC)   . DVT (deep venous thrombosis) (HCC)     LLE  . Pneumonia 12/06/2015  . GERD (gastroesophageal reflux disease)   . Migraine     "a few times/month" (12/06/2015)  . Depression    Past Surgical History  Procedure Laterality Date  . No past surgeries     Family History  Problem Relation Age of Onset  . Diabetes Mellitus II Mother   . Diabetes Mellitus II Father   . Stroke Maternal Grandfather    Social History  Substance Use Topics  . Smoking status: Current Every Day Smoker -- 0.25 packs/day for 48 years    Types: Cigarettes  . Smokeless tobacco: Never Used  . Alcohol Use: Yes    Review of Systems  Constitutional: Negative for fever and chills.  HENT: Negative for congestion and facial swelling.   Eyes: Negative for discharge and visual disturbance.   Respiratory: Negative for shortness of breath.   Cardiovascular: Negative for chest pain and palpitations.  Gastrointestinal: Positive for nausea, vomiting, abdominal pain and diarrhea.  Musculoskeletal: Negative for myalgias and arthralgias.  Skin: Negative for color change and rash.  Neurological: Negative for tremors, syncope and headaches.  Psychiatric/Behavioral: Negative for confusion and dysphoric mood.      Allergies  Latex  Home Medications   Prior to Admission medications   Medication Sig Start Date End Date Taking? Authorizing Provider  diltiazem (CARDIZEM SR) 120 MG 12 hr capsule Take 1 capsule (120 mg total) by mouth every 12 (twelve) hours. 12/05/15   Alison Murray, MD  glyBURIDE (DIABETA) 5 MG tablet Take 2 tablets (10 mg total) by mouth 2 (two) times daily with a meal. 12/05/15   Alison Murray, MD  hydrALAZINE (APRESOLINE) 25 MG tablet Take 1 tablet (25 mg total) by mouth every 6 (six) hours. 12/05/15   Alison Murray, MD  metFORMIN (GLUCOPHAGE) 1000 MG tablet Take 1 tablet (1,000 mg total) by mouth 2 (two) times daily. 12/11/15   Melene Plan, DO   Triage Vitals: BP 165/78 mmHg  Pulse 91  Temp(Src) 98.4 F (36.9 C) (Oral)  Resp 18  SpO2 99%   Physical Exam  Constitutional: He is oriented to person, place, and time. He appears well-developed and well-nourished.  Pt appears disheveled   HENT:  Head: Normocephalic  and atraumatic.  Eyes: EOM are normal. Pupils are equal, round, and reactive to light.  Neck: Normal range of motion. Neck supple. No JVD present.  Cardiovascular: Normal rate and regular rhythm.  Exam reveals no gallop and no friction rub.   No murmur heard. Pulmonary/Chest: No respiratory distress. He has no wheezes.  Abdominal: Soft. Bowel sounds are normal. He exhibits no distension. There is no tenderness. There is no rebound and no guarding.  Negative murphy's sign  Musculoskeletal: Normal range of motion. He exhibits edema.  2 plus peripheral  edema to lower extremities  ulcer to the R shin, appears chronic in nature   Neurological: He is alert and oriented to person, place, and time.  Skin: No rash noted. No pallor.  Psychiatric: He has a normal mood and affect. His behavior is normal.  Nursing note and vitals reviewed.   ED Course  Procedures (including critical care time)  DIAGNOSTIC STUDIES: Oxygen Saturation is 100% on RA, Normal by my interpretation.    COORDINATION OF CARE: 11:10 PM- Will give Zofran. Will order Lipase, CMP, CBC, and Urinalysis. Discussed treatment plan with pt at bedside and pt agreed to plan.     Labs Review Labs Reviewed  COMPREHENSIVE METABOLIC PANEL - Abnormal; Notable for the following:    Sodium 134 (*)    Glucose, Bld 498 (*)    BUN 34 (*)    Creatinine, Ser 1.64 (*)    Calcium 8.4 (*)    Total Protein 5.5 (*)    Albumin 2.2 (*)    GFR calc non Af Amer 43 (*)    GFR calc Af Amer 50 (*)    All other components within normal limits  CBC - Abnormal; Notable for the following:    RBC 3.22 (*)    Hemoglobin 9.3 (*)    HCT 28.5 (*)    All other components within normal limits  LIPASE, BLOOD    Imaging Review Dg Chest 2 View  12/11/2015  CLINICAL DATA:  65 year old male with shortness of breath and cough for 3 weeks. EXAM: CHEST  2 VIEW COMPARISON:  12/06/2015 and prior exams FINDINGS: Mild cardiomegaly again noted. Very small bilateral pleural effusions have increased. Diffuse peribronchial thickening is again noted. New patchy right upper lobe opacities and unchanged patchy lower lobe opacities are noted and suspicious for bronchopneumonia. There is no evidence of pneumothorax. IMPRESSION: New patchy right upper lobe opacities and unchanged patchy lower lobe opacity suspicious for bronchopneumonia. Slightly increasing very small bilateral pleural effusions. Mild cardiomegaly. Electronically Signed   By: Harmon PierJeffrey  Hu M.D.   On: 12/11/2015 07:59   I have personally reviewed and evaluated  these images and lab results as part of my medical decision-making.   EKG Interpretation None      MDM   Final diagnoses:  Hyperglycemia  Secondary hypertension, unspecified    65 yo M with a cc of diffuse abdominal pain.  Going on for 4 days with n/v/d subjective fevers chills.  Patient homeless, lab work about at baseline.  HTN on arrival.  Hyperglycemic without ketosis.  Will give IV fluids, d/c home, po trial.   Patient refusing IV, looking at prior notes patient typically in this range for BP and hyperglycemia.  Will d/c home as patient refusing care.   Patient refuses to leave states that he has the flu and should be admitted until he is better.  Discussed symptomatic therapy offered to let him stay in the waiting room.  Patient became  irate, security called.    I have discussed the diagnosis/risks/treatment options with the patient and believe the pt to be eligible for discharge home to follow-up with PCP. We also discussed returning to the ED immediately if new or worsening sx occur. We discussed the sx which are most concerning (e.g., sudden worsening pain, fever, inability to tolerate by mouth) that necessitate immediate return. Medications administered to the patient during their visit and any new prescriptions provided to the patient are listed below.  Medications given during this visit Medications  ondansetron (ZOFRAN-ODT) disintegrating tablet 4 mg (4 mg Oral Given 12/11/15 0008)    Discharge Medication List as of 12/11/2015  2:36 AM      The patient appears reasonably screen and/or stabilized for discharge and I doubt any other medical condition or other [facility] requiring further screening, evaluation, or treatment in the ED at this time prior to discharge.     I personally performed the services described in this documentation, which was scribed in my presence. The recorded information has been reviewed and is accurate.    Melene Plan, DO 12/11/15 1953

## 2015-12-10 NOTE — ED Notes (Signed)
Pt is aware urine is needed, pt states he is unable to urinate at this time. Urinal at bedside. Pt states that he wants food because he has not ate since yesterday.

## 2015-12-10 NOTE — ED Notes (Addendum)
Unable to obtain labs 

## 2015-12-10 NOTE — ED Notes (Signed)
Pt. reports generalized abdominal pain with emesis and diarrhea , denies fever or chills , hypertensive at arrival .

## 2015-12-10 NOTE — ED Notes (Signed)
No answer in waiting  .Marland Kitchen. Unable to get vital   signs

## 2015-12-11 ENCOUNTER — Emergency Department (HOSPITAL_COMMUNITY): Payer: Medicaid Other

## 2015-12-11 ENCOUNTER — Encounter (HOSPITAL_COMMUNITY): Payer: Self-pay | Admitting: Emergency Medicine

## 2015-12-11 ENCOUNTER — Inpatient Hospital Stay (HOSPITAL_COMMUNITY)
Admission: EM | Admit: 2015-12-11 | Discharge: 2015-12-13 | DRG: 195 | Disposition: A | Discharge status: Home / Self Care | Payer: Medicaid Other | Attending: Internal Medicine | Admitting: Internal Medicine

## 2015-12-11 DIAGNOSIS — J189 Pneumonia, unspecified organism: Secondary | ICD-10-CM | POA: Diagnosis present

## 2015-12-11 DIAGNOSIS — E118 Type 2 diabetes mellitus with unspecified complications: Secondary | ICD-10-CM

## 2015-12-11 DIAGNOSIS — F1010 Alcohol abuse, uncomplicated: Secondary | ICD-10-CM | POA: Diagnosis not present

## 2015-12-11 DIAGNOSIS — F1910 Other psychoactive substance abuse, uncomplicated: Secondary | ICD-10-CM | POA: Diagnosis present

## 2015-12-11 DIAGNOSIS — Z9114 Patient's other noncompliance with medication regimen: Secondary | ICD-10-CM

## 2015-12-11 DIAGNOSIS — N189 Chronic kidney disease, unspecified: Secondary | ICD-10-CM

## 2015-12-11 DIAGNOSIS — N183 Chronic kidney disease, stage 3 (moderate): Secondary | ICD-10-CM | POA: Diagnosis not present

## 2015-12-11 DIAGNOSIS — Z91148 Patient's other noncompliance with medication regimen for other reason: Secondary | ICD-10-CM

## 2015-12-11 DIAGNOSIS — D638 Anemia in other chronic diseases classified elsewhere: Secondary | ICD-10-CM | POA: Diagnosis not present

## 2015-12-11 DIAGNOSIS — I10 Essential (primary) hypertension: Secondary | ICD-10-CM | POA: Diagnosis present

## 2015-12-11 DIAGNOSIS — Z5900 Homelessness unspecified: Secondary | ICD-10-CM

## 2015-12-11 LAB — FOLATE: Folate: 20.6 ng/mL (ref >5.9)

## 2015-12-11 LAB — IRON AND TIBC
IRON: 33 ug/dL — AB (ref 45–182)
SATURATION RATIOS: 18 % (ref 17.9–39.5)
TIBC: 179 ug/dL — AB (ref 250–450)
UIBC: 146 ug/dL

## 2015-12-11 LAB — GLUCOSE, CAPILLARY
GLUCOSE-CAPILLARY: 470 mg/dL — AB (ref 65–99)
GLUCOSE-CAPILLARY: 483 mg/dL — AB (ref 65–99)
GLUCOSE-CAPILLARY: 448 mg/dL — AB (ref 65–99)
Glucose-Capillary: 248 mg/dL — ABNORMAL HIGH (ref 65–99)

## 2015-12-11 LAB — RETICULOCYTES
RBC.: 2.65 MIL/uL — ABNORMAL LOW (ref 4.22–5.81)
RETIC CT PCT: 2.6 % (ref 0.4–3.1)
Retic Count, Absolute: 68.9 10*3/uL (ref 19.0–186.0)

## 2015-12-11 LAB — CBG MONITORING, ED: GLUCOSE-CAPILLARY: 412 mg/dL — AB (ref 65–99)

## 2015-12-11 LAB — VITAMIN B12: Vitamin B-12: 693 pg/mL (ref 180–914)

## 2015-12-11 LAB — FERRITIN: FERRITIN: 501 ng/mL — AB (ref 24–336)

## 2015-12-11 MED ORDER — ENOXAPARIN SODIUM 40 MG/0.4ML ~~LOC~~ SOLN
40.0000 mg | SUBCUTANEOUS | Status: DC
  Administered 2015-12-11 – 2015-12-13 (×3): 40 mg via SUBCUTANEOUS
  Filled 2015-12-11 (×3): qty 0.4

## 2015-12-11 MED ORDER — VANCOMYCIN HCL IN DEXTROSE 750-5 MG/150ML-% IV SOLN
750.0000 mg | Freq: Two times a day (BID) | INTRAVENOUS | Status: DC
  Administered 2015-12-11 – 2015-12-12 (×3): 750 mg via INTRAVENOUS
  Filled 2015-12-11 (×5): qty 150

## 2015-12-11 MED ORDER — VANCOMYCIN HCL IN DEXTROSE 1-5 GM/200ML-% IV SOLN
1000.0000 mg | Freq: Once | INTRAVENOUS | Status: AC
  Administered 2015-12-11: 1000 mg via INTRAVENOUS
  Filled 2015-12-11: qty 200

## 2015-12-11 MED ORDER — THIAMINE HCL 100 MG/ML IJ SOLN
100.0000 mg | Freq: Every day | INTRAMUSCULAR | Status: DC
Start: 2015-12-11 — End: 2015-12-13
  Filled 2015-12-11: qty 2

## 2015-12-11 MED ORDER — LORAZEPAM 1 MG PO TABS
1.0000 mg | ORAL_TABLET | Freq: Four times a day (QID) | ORAL | Status: DC | PRN
  Administered 2015-12-11 (×2): 1 mg via ORAL
  Filled 2015-12-11 (×2): qty 1

## 2015-12-11 MED ORDER — INSULIN ASPART 100 UNIT/ML ~~LOC~~ SOLN
0.0000 [IU] | Freq: Three times a day (TID) | SUBCUTANEOUS | Status: DC
  Administered 2015-12-11 – 2015-12-12 (×4): 9 [IU] via SUBCUTANEOUS

## 2015-12-11 MED ORDER — INSULIN ASPART 100 UNIT/ML ~~LOC~~ SOLN
0.0000 [IU] | Freq: Every day | SUBCUTANEOUS | Status: DC
  Administered 2015-12-11: 2 [IU] via SUBCUTANEOUS

## 2015-12-11 MED ORDER — ADULT MULTIVITAMIN W/MINERALS CH
1.0000 | ORAL_TABLET | Freq: Every day | ORAL | Status: DC
Start: 2015-12-11 — End: 2015-12-13
  Administered 2015-12-11 – 2015-12-13 (×3): 1 via ORAL
  Filled 2015-12-11 (×3): qty 1

## 2015-12-11 MED ORDER — GUAIFENESIN ER 600 MG PO TB12
1200.0000 mg | ORAL_TABLET | Freq: Two times a day (BID) | ORAL | Status: DC
  Administered 2015-12-11 – 2015-12-13 (×5): 1200 mg via ORAL
  Filled 2015-12-11 (×5): qty 2

## 2015-12-11 MED ORDER — FOLIC ACID 1 MG PO TABS
1.0000 mg | ORAL_TABLET | Freq: Every day | ORAL | Status: DC
  Administered 2015-12-11 – 2015-12-13 (×3): 1 mg via ORAL
  Filled 2015-12-11 (×3): qty 1

## 2015-12-11 MED ORDER — METFORMIN HCL 1000 MG PO TABS: 1000.0000 mg | ORAL_TABLET | Freq: Two times a day (BID) | ORAL | Status: DC

## 2015-12-11 MED ORDER — SODIUM CHLORIDE 0.9 % IV BOLUS (SEPSIS)
2000.0000 mL | Freq: Once | INTRAVENOUS | Status: AC
  Administered 2015-12-11: 1000 mL via INTRAVENOUS

## 2015-12-11 MED ORDER — ONDANSETRON HCL 4 MG PO TABS: 4.0000 mg | ORAL_TABLET | Freq: Four times a day (QID) | ORAL | Status: DC | PRN

## 2015-12-11 MED ORDER — IPRATROPIUM-ALBUTEROL 0.5-2.5 (3) MG/3ML IN SOLN: 3.0000 mL | RESPIRATORY_TRACT | Status: DC | PRN

## 2015-12-11 MED ORDER — LORAZEPAM 2 MG/ML IJ SOLN: 1.0000 mg | Freq: Four times a day (QID) | INTRAMUSCULAR | Status: DC | PRN

## 2015-12-11 MED ORDER — CEFEPIME HCL 1 G IJ SOLR
1.0000 g | Freq: Two times a day (BID) | INTRAMUSCULAR | Status: DC
  Administered 2015-12-11 – 2015-12-13 (×5): 1 g via INTRAVENOUS
  Filled 2015-12-11 (×6): qty 1

## 2015-12-11 MED ORDER — SODIUM CHLORIDE 0.9 % IV SOLN
INTRAVENOUS | Status: DC
  Administered 2015-12-11: 16:00:00 via INTRAVENOUS

## 2015-12-11 MED ORDER — HYDRALAZINE HCL 25 MG PO TABS
25.0000 mg | ORAL_TABLET | Freq: Four times a day (QID) | ORAL | Status: DC
  Administered 2015-12-11 – 2015-12-12 (×5): 25 mg via ORAL
  Filled 2015-12-11 (×4): qty 1

## 2015-12-11 MED ORDER — DILTIAZEM HCL ER 60 MG PO CP12
120.0000 mg | ORAL_CAPSULE | Freq: Two times a day (BID) | ORAL | Status: DC
  Administered 2015-12-11 – 2015-12-13 (×5): 120 mg via ORAL
  Filled 2015-12-11 (×8): qty 2

## 2015-12-11 MED ORDER — VITAMIN B-1 100 MG PO TABS
100.0000 mg | ORAL_TABLET | Freq: Every day | ORAL | Status: DC
Start: 2015-12-11 — End: 2015-12-13
  Administered 2015-12-12 – 2015-12-13 (×2): 100 mg via ORAL
  Filled 2015-12-11 (×3): qty 1

## 2015-12-11 MED ORDER — ONDANSETRON HCL 4 MG/2ML IJ SOLN: 4.0000 mg | Freq: Four times a day (QID) | INTRAMUSCULAR | Status: DC | PRN
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
## 2015-12-11 NOTE — ED Notes (Signed)
IV attempt x1 without success. Pt refused any further attempts. Pt states he wants to be left alone so he can get some sleep. Bedside RN notified.

## 2015-12-11 NOTE — ED Notes (Signed)
Md Adela LankFloyd notified of pt refusal for IV attempt. MD also notified of high CBG. MD acknowledged. No new orders at this time.

## 2015-12-11 NOTE — ED Notes (Signed)
Pt refusing to leave, escorted [facility].

## 2015-12-11 NOTE — ED Notes (Signed)
Patient has consumed meal and ginger ale with no problems. Currently trying to sleep, requests to leave light off.

## 2015-12-11 NOTE — Progress Notes (Addendum)
Medicaid [place] access [place] [address] [place] [id number] [phone] as assigned pcp for pt   Entered in d/c instructions   Follow-up With Details Why Contact Info  [place] [facility] Schedule an appointment as soon as possible for a visit Medicaid [place] access lists [place] [facility] as your assigned Dr if this is not correct contadt DSS to correct this [phone]  [address] [place] [phone]

## 2015-12-11 NOTE — ED Notes (Signed)
IV team is at the bedside.  

## 2015-12-11 NOTE — Progress Notes (Signed)
attempted to complete admission history, patient was very uncooperative, yelling, and refusing to answer questions.

## 2015-12-11 NOTE — Consult Note (Signed)
ANTIBIOTIC CONSULT NOTE - INITIAL  Pharmacy Consult for Vancomycin and Cefepime Indication: pneumonia  Allergies  Allergen Reactions  . Latex Rash    Patient Measurements: Weight 83.7kg Height 5'11''  Vital Signs: Temp: 98.8 F (37.1 C) (01/02 0624) Temp Source: Oral (01/02 0624) BP: 186/106 mmHg (01/02 0630) Pulse Rate: 93 (01/02 0630) Intake/Output from previous day:   Intake/Output from this shift:    Labs:  Recent Labs  12/10/15 2220  WBC 6.3  HGB 9.3*  PLT 224  CREATININE 1.64*   Estimated Creatinine Clearance: 49.7 mL/min (by C-G formula based on Cr of 1.64).  Microbiology: Recent Results (from the past 720 hour(s))  Blood culture (routine x 2)     Status: None   Collection Time: 12/04/15  3:45 AM  Result Value Ref Range Status   Specimen Description BLOOD RIGHT HAND  Final   Special Requests IN PEDIATRIC BOTTLE 4CC  Final   Culture   Final    NO GROWTH 5 DAYS Performed [facility]    Report Status 12/09/2015 FINAL  Final  Blood culture (routine x 2)     Status: None   Collection Time: 12/04/15  4:50 AM  Result Value Ref Range Status   Specimen Description BLOOD LEFT WRIST  Final   Special Requests BOTTLES DRAWN AEROBIC AND ANAEROBIC 5CC  Final   Culture   Final    NO GROWTH 5 DAYS Performed at [facility]    Report Status 12/09/2015 FINAL  Final  MRSA PCR Screening     Status: None   Collection Time: 12/07/15  7:17 AM  Result Value Ref Range Status   MRSA by PCR NEGATIVE NEGATIVE Final    Comment:        The GeneXpert MRSA Assay (FDA approved for NASAL specimens only), is one component of a comprehensive MRSA colonization surveillance program. It is not intended to diagnose MRSA infection nor to guide or monitor treatment for MRSA infections.     Medical History: Past Medical History  Diagnosis Date  . CKD (chronic kidney disease), stage III   . Anemia   . HTN (hypertension)   . Type II diabetes mellitus (HCC)    . DVT (deep venous thrombosis) (HCC)     LLE  . Pneumonia 12/06/2015  . GERD (gastroesophageal reflux disease)   . Migraine     "a few times/month" (12/06/2015)  . Depression    Assessment: 64yom presents to the ED with abdominal pain, vomiting, and diarrhea. CXR shows new patchy RUL opacities suspicious for bronchopneumonia. He will begin vancomycin and cefepime. ARF on CKD noted with sCr 1.64 and CrCl ~ 8650ml/min.  Goal of Therapy:  Vancomycin trough level 15-20 mcg/ml  Plan:  1) Vancomycin 1g IV x 1 then 750mg  IV q12 2) Cefepime 1g IV q12 3) Follow renal function, cultures, LOT, level if needed  Clayton RiggerMarkle, Clayton [name] 12/11/2015,8:20 AM

## 2015-12-11 NOTE — H&P (Signed)
[facility] History and Physical  Clayton [name] UEA:[id number] DOB: 1951-04-02 DOA: 12/11/2015  Referring physician: Dr Criss Alvine PCP: No PCP Per Patient   Chief Complaint: Cough  HPI: Clayton [name] is a 65 y.o. male  Patient is an extremely poor historian. History provided by EDP, nursing notes and minimally by patient as he is minimally to non-participatory. Of note history is different from what is documented in the chart compared to what patient gave to me at time of admission. Patient with ongoing cough and intermittent shortness of breath for several weeks to months. Patient apparently initially presented to the ED with complaints of abdominal pain nausea and vomiting. He subsequently left the ED and then return to short time later with complaints of cough and shortness of breath. Patient is not taken any of his medications since being discharged from the hospital on 12/07/2019 1640s admitted for hyperglycemia and medical noncompliance. Current episode is associated with a "wet cough, " and flulike symptoms per patient. Denies any current abdominal pain. Denies fevers. Patient did not try anything for her symptoms to make them better or worse.   Review of Systems:  Unreliable history provided. Patient minimally to non-participatory in exam as he wants to be left alone.  Past Medical History  Diagnosis Date  . CKD (chronic kidney disease), stage III   . Anemia   . HTN (hypertension)   . Type II diabetes mellitus (HCC)   . DVT (deep venous thrombosis) (HCC)     LLE  . Pneumonia 12/06/2015  . GERD (gastroesophageal reflux disease)   . Migraine     "a few times/month" (12/06/2015)  . Depression    Past Surgical History  Procedure Laterality Date  . No past surgeries     Social History:  reports that he has been smoking Cigarettes.  He has a 12 pack-year smoking history. He has never used smokeless tobacco. He reports that he drinks alcohol. He reports that he uses  illicit drugs ("Crack" cocaine).  Allergies  Allergen Reactions  . Latex Rash    Family History  Problem Relation Age of Onset  . Diabetes Mellitus II Mother   . Diabetes Mellitus II Father   . Stroke Maternal Grandfather      Prior to Admission medications   Medication Sig Start Date End Date Taking? Authorizing Provider  diltiazem (CARDIZEM SR) 120 MG 12 hr capsule Take 1 capsule (120 mg total) by mouth every 12 (twelve) hours. 12/05/15   Alison Murray, MD  glyBURIDE (DIABETA) 5 MG tablet Take 2 tablets (10 mg total) by mouth 2 (two) times daily with a meal. 12/05/15   Alison Murray, MD  hydrALAZINE (APRESOLINE) 25 MG tablet Take 1 tablet (25 mg total) by mouth every 6 (six) hours. 12/05/15   Alison Murray, MD  metFORMIN (GLUCOPHAGE) 1000 MG tablet Take 1 tablet (1,000 mg total) by mouth 2 (two) times daily. 12/11/15   Melene Plan, DO   Physical Exam: Filed Vitals:   12/11/15 0624 12/11/15 0630  BP: 190/107 186/106  Pulse: 94 93  Temp: 98.8 F (37.1 C)   TempSrc: Oral   Resp: 16   SpO2: 99% 98%    Wt Readings from Last 3 Encounters:  12/07/15 83.7 kg (184 lb 8.4 oz)  12/04/15 78.3 kg (172 lb 9.9 oz)  11/01/15 77.157 kg (170 lb 1.6 oz)    General:  Appears calm and comfortable Eyes:  PERRL, EOMI, normal lids, iris ENT: Dry MM Neck:  no LAD, masses or thyromegaly Cardiovascular:  RRR, no m/r/g. No LE edema.  Respiratory: Slightly diminished breath sounds throughout. Few crackles heard in lung bases, no respiratory distress. Abdomen:  soft, ntnd Skin:  no rash or induration seen on limited exam Musculoskeletal:  grossly normal tone BUE/BLE Psychiatric:  grossly normal mood and affect, speech fluent and appropriate Neurologic:  CN 2-12 grossly intact, moves all extremities in coordinated fashion.          Labs on Admission:  Basic Metabolic Panel:  Recent Labs Lab 12/04/15 1123 12/06/15 1156 12/06/15 2105 12/10/15 2220  NA 137 135 137 134*  K 4.0 4.3 4.0 5.1    CL 105 101 101 102  CO2 25 26 26 24   GLUCOSE 99 594* 208* 498*  BUN 38* 43* 42* 34*  CREATININE 1.80* 1.97* 2.04* 1.64*  CALCIUM 8.0* 8.6* 8.3* 8.4*   Liver Function Tests:  Recent Labs Lab 12/06/15 1156 12/10/15 2220  AST 30 35  ALT 28 23  ALKPHOS 160* 109  BILITOT 0.7 0.6  PROT 6.1* 5.5*  ALBUMIN 2.6* 2.2*    Recent Labs Lab 12/06/15 1156 12/10/15 2220  LIPASE 23 17   No results for input(s): AMMONIA in the last 168 hours. CBC:  Recent Labs Lab 12/06/15 1156 12/06/15 2105 12/10/15 2220  WBC 5.0 5.3 6.3  NEUTROABS 3.8  --   --   HGB 9.0* 8.3* 9.3*  HCT 27.0* 24.0* 28.5*  MCV 86.8 87.3 88.5  PLT 196 180 224   Cardiac Enzymes: No results for input(s): CKTOTAL, CKMB, CKMBINDEX, TROPONINI in the last 168 hours.  BNP (last 3 results)  Recent Labs  08/17/15 0330 12/04/15 0422  BNP 303.5* 557.6*    ProBNP (last 3 results) No results for input(s): PROBNP in the last 8760 hours.   CREATININE: 1.64 mg/dL ABNORMAL (16/10/96 0454) Estimated creatinine clearance - 49.7 mL/min  CBG:  Recent Labs Lab 12/07/15 0133 12/07/15 0240 12/07/15 0348 12/07/15 0855 12/11/15 0716  GLUCAP 162* 164* 135* 125* 412*    Radiological Exams on Admission: Dg Chest 2 View  12/11/2015  CLINICAL DATA:  65 year old male with shortness of breath and cough for 3 weeks. EXAM: CHEST  2 VIEW COMPARISON:  12/06/2015 and prior exams FINDINGS: Mild cardiomegaly again noted. Very small bilateral pleural effusions have increased. Diffuse peribronchial thickening is again noted. New patchy right upper lobe opacities and unchanged patchy lower lobe opacities are noted and suspicious for bronchopneumonia. There is no evidence of pneumothorax. IMPRESSION: New patchy right upper lobe opacities and unchanged patchy lower lobe opacity suspicious for bronchopneumonia. Slightly increasing very small bilateral pleural effusions. Mild cardiomegaly. Electronically Signed   By: Harmon Pier M.D.   On:  12/11/2015 07:59      Assessment/Plan Active Problems:   Noncompliance with medications   Alcohol abuse   Essential hypertension   HCAP (healthcare-associated pneumonia)   CKD (chronic kidney disease)   Diabetes mellitus with complication (HCC)   Homeless   Anemia of chronic disease   HCAP: new consolidations noted in lung apices bilaterally. Patient discharged from hospital on 12/07/2015. No respiratory distress or evidence of failure/decompensation. Pt is homeless and a drug abuser. Recent HIV negative.  - med surge - continue Vanc cefepime - Quant Gold - sputum Cx - O2 PRN - legionella and strep Ag - Duoneb PRN - mucinex  HTN: marked elevation. Pt not taking meds - continue home hydralazine and dilt  DM:  - SSI  CKD: Cr 1.7, baseline 1.6.  Mild dehydration per history - BMET in am  Polysubstance abuse: - UDS - CIWA  Anemia: Hgb 9.3. basleine 10-11. Normocytic. Suspect nutritional and of chronic disease - anemia panel - FOBT - CBC in am  Social: Homeless. Non-compliant w/ medications. Polysubstance abuse. Pt intermittently abrasive and abusive to staff.  - CSW for community resources.     Code Status: FULL  DVT Prophylaxis: Lovenox Family Communication: none Disposition Plan: Pending Improvement    Michel Hendon Shela CommonsJ, MD Family Medicine [facility] [email or web address] Password [email or web address]

## 2015-12-11 NOTE — ED Provider Notes (Signed)
CSN: [id number]     Arrival date & time 12/11/15  0609 History   First MD Initiated Contact with Patient 12/11/15 [phone]     Chief Complaint  Patient presents with  . Influenza     (Consider location/radiation/quality/duration/timing/severity/associated sxs/prior Treatment) HPI  65 year old male presents with cough and sneezing for 3 weeks. He states he has had the flu for this long. He denies any fevers. No shortness of breath or chest pain. Patient states it's a "wet cough". Patient was just here a few hours ago but refused an IV and was discharged. He then check back in. That original chief complaint was abdominal pain. He states he does have abdominal pain but is not bothersome. It has been going on for the 3 weeks that he has had the cough. His abdomen is "sore" and only hurts when coughing.   Past Medical History  Diagnosis Date  . CKD (chronic kidney disease), stage III   . Anemia   . HTN (hypertension)   . Type II diabetes mellitus (HCC)   . DVT (deep venous thrombosis) (HCC)     LLE  . Pneumonia 12/06/2015  . GERD (gastroesophageal reflux disease)   . Migraine     "a few times/month" (12/06/2015)  . Depression    Past Surgical History  Procedure Laterality Date  . No past surgeries     Family History  Problem Relation Age of Onset  . Diabetes Mellitus II Mother   . Diabetes Mellitus II Father   . Stroke Maternal Grandfather    Social History  Substance Use Topics  . Smoking status: Current Every Day Smoker -- 0.25 packs/day for 48 years    Types: Cigarettes  . Smokeless tobacco: Never Used  . Alcohol Use: Yes    Review of Systems  Constitutional: Negative for fever.  HENT: Positive for congestion and sneezing.   Respiratory: Positive for cough. Negative for shortness of breath.   Cardiovascular: Negative for chest pain.  Gastrointestinal: Positive for abdominal pain. Negative for nausea, vomiting and diarrhea.  All other systems reviewed and are  negative.     Allergies  Latex  Home Medications   Prior to Admission medications   Medication Sig Start Date End Date Taking? Authorizing Provider  diltiazem (CARDIZEM SR) 120 MG 12 hr capsule Take 1 capsule (120 mg total) by mouth every 12 (twelve) hours. 12/05/15   Alison Murray, MD  glyBURIDE (DIABETA) 5 MG tablet Take 2 tablets (10 mg total) by mouth 2 (two) times daily with a meal. 12/05/15   Alison Murray, MD  hydrALAZINE (APRESOLINE) 25 MG tablet Take 1 tablet (25 mg total) by mouth every 6 (six) hours. 12/05/15   Alison Murray, MD  metFORMIN (GLUCOPHAGE) 1000 MG tablet Take 1 tablet (1,000 mg total) by mouth 2 (two) times daily. 12/11/15   Melene Plan, DO   BP 186/106 mmHg  Pulse 93  Temp(Src) 98.8 F (37.1 C) (Oral)  Resp 16  SpO2 98% Physical Exam  Constitutional: He is oriented to person, place, and time. He appears well-developed and well-nourished. No distress.  Resting comfortably in stretcher  HENT:  Head: Normocephalic and atraumatic.  Right Ear: External ear normal.  Left Ear: External ear normal.  Nose: Nose normal.  Eyes: Right eye exhibits no discharge. Left eye exhibits no discharge.  Neck: Neck supple.  Cardiovascular: Normal rate, regular rhythm, normal heart sounds and intact distal pulses.   Pulmonary/Chest: Effort normal. He has no wheezes.  Coarse breath  sounds bilaterally  Abdominal: Soft. He exhibits no distension. There is no tenderness.  Musculoskeletal: He exhibits no edema.  Neurological: He is alert and oriented to person, place, and time.  Skin: Skin is warm and dry. He is not diaphoretic.  Nursing note and vitals reviewed.   ED Course  Procedures (including critical care time) Labs Review Labs Reviewed  CBG MONITORING, ED - Abnormal; Notable for the following:    Glucose-Capillary 412 (*)    All other components within normal limits  CULTURE, BLOOD (ROUTINE X 2)  CULTURE, BLOOD (ROUTINE X 2)    Imaging Review Dg Chest 2  View  12/11/2015  CLINICAL DATA:  65 year old male with shortness of breath and cough for 3 weeks. EXAM: CHEST  2 VIEW COMPARISON:  12/06/2015 and prior exams FINDINGS: Mild cardiomegaly again noted. Very small bilateral pleural effusions have increased. Diffuse peribronchial thickening is again noted. New patchy right upper lobe opacities and unchanged patchy lower lobe opacities are noted and suspicious for bronchopneumonia. There is no evidence of pneumothorax. IMPRESSION: New patchy right upper lobe opacities and unchanged patchy lower lobe opacity suspicious for bronchopneumonia. Slightly increasing very small bilateral pleural effusions. Mild cardiomegaly. Electronically Signed   By: Harmon PierJeffrey  Hu M.D.   On: 12/11/2015 07:59   I have personally reviewed and evaluated these images and lab results as part of my medical decision-making.   EKG Interpretation None      MDM   Final diagnoses:  HCAP (healthcare-associated pneumonia)    Patient appears well, no acute distress. Is hypertensive which is a chronic issue for him. CXR shows new RUL infiltrate in addition to RLL. Given history of DM, ETOH abuse and homelessness with multiple admissions, he is higher risk for HCAP organisms and will be given broad antibiotics. D/w Dr. Konrad DoloresMerrell, who will admit to observation on telemetry.     Pricilla LovelessScott Aideliz Garmany, MD 12/11/15 [phone]

## 2015-12-11 NOTE — ED Notes (Signed)
Asked pt for a urine sample again. Pt would not wake up to give me a answer. Pt just mumbled I was unable to understand what pt said.

## 2015-12-11 NOTE — Progress Notes (Addendum)
Inpatient Diabetes Program Recommendations  AACE/ADA: New Consensus Statement on Inpatient Glycemic Control (2015)  Target Ranges:  Prepandial:   less than 140 mg/dL      Peak postprandial:   less than 180 mg/dL (1-2 hours)      Critically ill patients:  140 - 180 mg/dL    Results for Clayton [name], Clayton [name] (MRN [id number]) as of 12/11/2015 14:27  Ref. Range 12/11/2015 07:16 12/11/2015 11:55  Glucose-Capillary Latest Ref Range: 65-99 mg/dL 119412 (H) 147470 (H)    Results for Clayton [name], Clayton [name] (MRN [id number]) as of 12/11/2015 14:27  Ref. Range 08/17/2015 05:19 10/08/2015 08:58 12/04/2015 11:23  Hemoglobin A1C Latest Ref Range: 4.8-5.6 % 12.7 (H) 14.7 (H) 14.7 (H)     Admit with: Hyperglycemia/ Pneumonia  History: DM, HTN, CKD3  Home DM Meds: Glyburide 10 mg bid (patient not taking per report)       Metformin 1000 mg bid  Current Insulin Orders: Novolog Sensitive SSI (0-9 units) TID AC + HS     -Per Dr. Sheryn Bisonanford's H&P note from 12/04/15: "The patient has been admitted 4 times in the last 3 months for hyperglycemia, usually with demand ischemia, hypertensive urgency, and vomiting. Most of those times he has left AMA, and has insisted that he is not able to take insulin."  -Per Dr. Armando Gangevine's Discharge Summary note from 12/05/15:  "Please note pt was very angry this am, yelling at the staff, myself, cursing, abusive and has refused CBG check, medications.  The pt said he wanted to go home and has refused to let me do physical exam. He told me and RN and tech to "get out of his room".  -Well known to the Inpatient Glycemic Control Team- Was counseled extensively back in October (10/09/15). Despite this counseling, patient refuses to take insulin and will not even take oral DM meds or check CBGs at home.  -Note Novolog Sensitive SSI started this AM.     --Will follow patient during hospitalization--  Ambrose FinlandJeannine Johnston Jarrick Fjeld RN, MSN, CDE Diabetes Coordinator Inpatient Glycemic Control Team Team  Pager: [phone] (8a-5p)

## 2015-12-11 NOTE — ED Notes (Signed)
Attempted report 

## 2015-12-11 NOTE — ED Notes (Signed)
Pt states he has flu like symptoms x 1 month. Pt states he has constant nausea, 1 episode of emesis in past 24 hr. Pt is AO x 4.

## 2015-12-11 NOTE — ED Notes (Signed)
Pt refused IV by staff. Unable to give IV fluids. Notified MD Adela LankFloyd. MD acknowledges. Pt to be discharged.

## 2015-12-11 NOTE — Discharge Instructions (Signed)
Hyperglycemia °High blood sugar (hyperglycemia) means that the level of sugar in your blood is higher than it should be. Signs of high blood sugar include: °· Feeling thirsty. °· Frequent peeing (urinating). °· Feeling tired or sleepy. °· Dry mouth. °· Vision changes. °· Feeling weak. °· Feeling hungry but losing weight. °· Numbness and tingling in your hands or feet. °· Headache. °When you ignore these signs, your blood sugar may keep going up. These problems may get worse, and other problems may begin. °HOME CARE °· Check your blood sugars as told by your doctor. Write down the numbers with the date and time. °· Take the right amount of insulin or diabetes pills at the right time. Write down the dose with date and time. °· Refill your insulin or diabetes pills before running out. °· Watch what you eat. Follow your meal plan. °· Drink liquids without sugar, such as water. Check with your doctor if you have kidney or heart disease. °· Follow your doctor's orders for exercise. Exercise at the same time of day. °· Keep your doctor's appointments. °GET HELP RIGHT AWAY IF:  °· You have trouble thinking or are confused. °· You have fast breathing with fruity smelling breath. °· You pass out (faint). °· You have 2 to 3 days of high blood sugars and you do not know why. °· You have chest pain. °· You are feeling sick to your stomach (nauseous) or throwing up (vomiting). °· You have sudden vision changes. °MAKE SURE YOU:  °· Understand these instructions. °· Will watch your condition. °· Will get help right away if you are not doing well or get worse. °  °This information is not intended to replace advice given to you by your health care provider. Make sure you discuss any questions you have with your health care provider. °  °Document Released: 09/22/2009 Document Revised: 12/16/2014 Document Reviewed: 08/01/2015 °Elsevier Interactive Patient Education ©2016 [employer] ° °

## 2015-12-12 DIAGNOSIS — Z590 Homelessness: Secondary | ICD-10-CM | POA: Diagnosis not present

## 2015-12-12 DIAGNOSIS — D509 Iron deficiency anemia, unspecified: Secondary | ICD-10-CM | POA: Diagnosis present

## 2015-12-12 DIAGNOSIS — E860 Dehydration: Secondary | ICD-10-CM | POA: Diagnosis present

## 2015-12-12 DIAGNOSIS — E1122 Type 2 diabetes mellitus with diabetic chronic kidney disease: Secondary | ICD-10-CM | POA: Diagnosis present

## 2015-12-12 DIAGNOSIS — E1121 Type 2 diabetes mellitus with diabetic nephropathy: Secondary | ICD-10-CM | POA: Diagnosis present

## 2015-12-12 DIAGNOSIS — F1010 Alcohol abuse, uncomplicated: Secondary | ICD-10-CM

## 2015-12-12 DIAGNOSIS — N183 Chronic kidney disease, stage 3 (moderate): Secondary | ICD-10-CM | POA: Diagnosis present

## 2015-12-12 DIAGNOSIS — Z9114 Patient's other noncompliance with medication regimen: Secondary | ICD-10-CM | POA: Diagnosis not present

## 2015-12-12 DIAGNOSIS — Z91040 Latex allergy status: Secondary | ICD-10-CM | POA: Diagnosis not present

## 2015-12-12 DIAGNOSIS — Z7984 Long term (current) use of oral hypoglycemic drugs: Secondary | ICD-10-CM | POA: Diagnosis not present

## 2015-12-12 DIAGNOSIS — F1490 Cocaine use, unspecified, uncomplicated: Secondary | ICD-10-CM | POA: Diagnosis present

## 2015-12-12 DIAGNOSIS — I10 Essential (primary) hypertension: Secondary | ICD-10-CM

## 2015-12-12 DIAGNOSIS — D638 Anemia in other chronic diseases classified elsewhere: Secondary | ICD-10-CM | POA: Diagnosis present

## 2015-12-12 DIAGNOSIS — I129 Hypertensive chronic kidney disease with stage 1 through stage 4 chronic kidney disease, or unspecified chronic kidney disease: Secondary | ICD-10-CM | POA: Diagnosis present

## 2015-12-12 DIAGNOSIS — F329 Major depressive disorder, single episode, unspecified: Secondary | ICD-10-CM | POA: Diagnosis present

## 2015-12-12 DIAGNOSIS — K219 Gastro-esophageal reflux disease without esophagitis: Secondary | ICD-10-CM | POA: Diagnosis present

## 2015-12-12 DIAGNOSIS — J189 Pneumonia, unspecified organism: Secondary | ICD-10-CM | POA: Diagnosis present

## 2015-12-12 DIAGNOSIS — E118 Type 2 diabetes mellitus with unspecified complications: Secondary | ICD-10-CM | POA: Diagnosis not present

## 2015-12-12 DIAGNOSIS — E1165 Type 2 diabetes mellitus with hyperglycemia: Secondary | ICD-10-CM | POA: Diagnosis present

## 2015-12-12 DIAGNOSIS — Y95 Nosocomial condition: Secondary | ICD-10-CM | POA: Diagnosis present

## 2015-12-12 DIAGNOSIS — F17210 Nicotine dependence, cigarettes, uncomplicated: Secondary | ICD-10-CM | POA: Diagnosis present

## 2015-12-12 DIAGNOSIS — Z9119 Patient's noncompliance with other medical treatment and regimen: Secondary | ICD-10-CM | POA: Diagnosis not present

## 2015-12-12 LAB — GLUCOSE, CAPILLARY
GLUCOSE-CAPILLARY: 539 mg/dL — AB (ref 65–99)
GLUCOSE-CAPILLARY: 361 mg/dL — AB (ref 65–99)
GLUCOSE-CAPILLARY: 336 mg/dL — AB (ref 65–99)
GLUCOSE-CAPILLARY: 265 mg/dL — AB (ref 65–99)

## 2015-12-12 LAB — INFLUENZA PANEL BY PCR (TYPE A & B)
H1N1FLUPCR: NOT DETECTED
INFLBPCR: NEGATIVE
Influenza A By PCR: NEGATIVE

## 2015-12-12 LAB — HIV ANTIBODY (ROUTINE TESTING W REFLEX): HIV Screen 4th Generation wRfx: NONREACTIVE

## 2015-12-12 MED ORDER — FERROUS SULFATE 325 (65 FE) MG PO TABS
325.0000 mg | ORAL_TABLET | Freq: Every day | ORAL | Status: DC
  Administered 2015-12-13: 325 mg via ORAL
  Filled 2015-12-12: qty 1

## 2015-12-12 MED ORDER — INSULIN ASPART 100 UNIT/ML ~~LOC~~ SOLN
0.0000 [IU] | Freq: Every day | SUBCUTANEOUS | Status: DC
Start: 2015-12-12 — End: 2015-12-13
  Administered 2015-12-12: 3 [IU] via SUBCUTANEOUS

## 2015-12-12 MED ORDER — INSULIN ASPART 100 UNIT/ML ~~LOC~~ SOLN
0.0000 [IU] | Freq: Three times a day (TID) | SUBCUTANEOUS | Status: DC
  Administered 2015-12-12: 11 [IU] via SUBCUTANEOUS
  Administered 2015-12-13: 5 [IU] via SUBCUTANEOUS
  Administered 2015-12-13: 3 [IU] via SUBCUTANEOUS

## 2015-12-12 MED ORDER — INSULIN ASPART 100 UNIT/ML ~~LOC~~ SOLN
9.0000 [IU] | Freq: Once | SUBCUTANEOUS | Status: DC
Start: 2015-12-12 — End: 2015-12-12

## 2015-12-12 MED ORDER — ISOSORB DINITRATE-HYDRALAZINE 20-37.5 MG PO TABS
1.0000 | ORAL_TABLET | Freq: Three times a day (TID) | ORAL | Status: DC
  Administered 2015-12-12 – 2015-12-13 (×3): 1 via ORAL
  Filled 2015-12-12 (×3): qty 1

## 2015-12-12 MED ORDER — INSULIN DETEMIR 100 UNIT/ML ~~LOC~~ SOLN
15.0000 [IU] | Freq: Every day | SUBCUTANEOUS | Status: DC
  Administered 2015-12-12: 15 [IU] via SUBCUTANEOUS
  Filled 2015-12-12 (×2): qty 0.15

## 2015-12-12 NOTE — Progress Notes (Signed)
Inpatient Diabetes Program Recommendations  AACE/ADA: New Consensus Statement on Inpatient Glycemic Control (2015)  Target Ranges:  Prepandial:   less than 140 mg/dL      Peak postprandial:   less than 180 mg/dL (1-2 hours)      Critically ill patients:  140 - 180 mg/dL    Results for Clayton [name], Clayton [name] (MRN [id number]) as of 12/12/2015 09:57  Ref. Range 12/11/2015 07:16 12/11/2015 11:55 12/11/2015 16:59 12/11/2015 17:57 12/11/2015 22:22  Glucose-Capillary Latest Ref Range: 65-99 mg/dL 725412 (H) 366470 (H) 440483 (H) 448 (H) 248 (H)    Results for Clayton [name], Clayton [name] (MRN [id number]) as of 12/12/2015 09:57  Ref. Range 12/12/2015 08:31  Glucose-Capillary Latest Ref Range: 65-99 mg/dL 387539 (H)    Admit with: Hyperglycemia/ Pneumonia  History: DM, HTN, CKD3  Home DM Meds: Glyburide 10 mg bid (patient not taking per report)  Metformin 1000 mg bid  Current Insulin Orders: Novolog Sensitive SSI (0-9 units) TID AC + HS     -Per Dr. Sheryn Bisonanford's H&P note from 12/04/15: "The patient has been admitted 4 times in the last 3 months for hyperglycemia, usually with demand ischemia, hypertensive urgency, and vomiting. Most of those times he has left AMA, and has insisted that he is not able to take insulin."  -Per Dr. Armando Gangevine's Discharge Summary note from 12/05/15: "Please note pt was very angry this am, yelling at the staff, myself, cursing, abusive and has refused CBG check, medications. The pt said he wanted to go home and has refused to let me do physical exam. He told me and RN and tech to "get out of his room".  -Per notes from this admission, patient has been uncooperative with staff and has been verbally abusive to RN as well.  -Well known to the Inpatient Glycemic Control Team- Was counseled extensively back in October (10/09/15). Despite this counseling, patient refuses to take insulin and will not even take oral DM meds or check CBGs at home.    MD- Please consider the following  in-hospital insulin adjustments:  1. Start Levemir 15 units daily (0.2 units/kg dosing)  2. Increase Novolog SSI to Moderate scale (0-15 units) TID AC + HS     --Will follow patient during hospitalization--  Ambrose FinlandJeannine Johnston Rodrigues Urbanek RN, MSN, CDE Diabetes Coordinator Inpatient Glycemic Control Team Team Pager: [phone] (8a-5p)

## 2015-12-12 NOTE — Progress Notes (Signed)
[facility] PROGRESS NOTE  Clayton [name] WJX:[id number] DOB: November 25, 1951 DOA: 12/11/2015 PCP: PROVIDER NOT IN SYSTEM  Brief Summary  Clayton [name] is a 65 y.o. Male with history of diabetes, HTN, CKD stage 3, polysubstance abuse, medical noncompliance, who is minimally to non-participatory.  He is also inconsistent with his history.  He has had 7 admissions and 13 ER visits in the last 6 months.  He initially presented to the ER with abdominal pain, nausea and vomiting. He subsequently left the ED and then returned a Clayton [name] time later with complaints of cough and shortness of breath.  He had not taken any of his medications since being discharged from the hospital on 12/07/2015 at which time he was admitted for hyperglycemia and medical noncompliance. Current episode is associated with a "wet cough, " and flulike symptoms per patient. Denies any current abdominal pain. Denies fevers. Patient did not try anything for symptoms to make them better or worse.   Assessment/Plan  HCAP: new consolidations noted in lung apices bilaterally. Patient discharged from hospital on 12/07/2015. No respiratory distress or evidence of failure/decompensation. Pt is homeless and a drug abuser. Recent HIV negative.  - continue Vanc cefepime day 2 - Quant Gold pending - sputum Cx not drawn - legionella and strep Ag not drawn - Duoneb PRN - mucinex - BCx NGTD  HTN: trending down - continue dilt - start bidil TID -  Avoid BB due to ongoing cocaine use -  Would be careful with diuretics and ACEI/ARB in setting of noncompliance and not able to draw routine labs  DM type 2 with hyperglycemia, last a1c 14.7 on 12/04/2015 -  Noncompliant with metformin and SU -  Start lantus 15 units with mod dose SSI  CKD stage 3: Cr 1.7, baseline 1.6. Mild dehydration per history -  Creatinine stable -  D/c IVF  Polysubstance abuse: - UDS pending - CIWA  Iron deficiency anemia and anemia of chronic disease  (ferritin elevated):  - FOBT - start iron supplementation - Recommend GI f/u   Social: Homeless. Non-compliant w/ medications. Polysubstance abuse. Pt intermittently abrasive and abusive to staff. Last discharge summary stated patient should not be admitted to our facility without police or security escort. - CSW for community resources.   Diet:  diabetic Access:  PIV IVF:  off Proph:  lovenox  Code Status: full Family Communication: patient alone Disposition Plan: pending improvement in breathing   Consultants:  none  Procedures:  CXR  Antibiotics:  Vancomycin 1/2 >  Cefepime 1/2 >    HPI/Subjective:  Still SOB and does not want to get out of bed due to dyspnea.  He wants to be wheeled down to cash a check, however.    Objective: Filed Vitals:   12/11/15 1000 12/11/15 1037 12/11/15 1721 12/12/15 0512  BP: 177/102 183/95 184/96 155/67  Pulse: 95 95 100 100  Temp:  98.7 F (37.1 C) 98.9 F (37.2 C) 98.5 F (36.9 C)  TempSrc:  Oral Oral Oral  Resp:  17 18 19   Weight:    76.4 kg (168 lb 6.9 oz)  SpO2: 91% 95% 90% 92%    Intake/Output Summary (Last 24 hours) at 12/12/15 1505 Last data filed at 12/12/15 1343  Gross per 24 hour  Intake   2600 ml  Output   1375 ml  Net   1225 ml   Filed Weights   12/12/15 0512  Weight: 76.4 kg (168 lb 6.9 oz)   Body mass index is  23.5 kg/(m^2).  Exam:   General:  Adult male, No acute distress  HEENT:  NCAT, MMM  Cardiovascular:  RRR, nl S1, S2 no mrg, 2+ pulses, warm extremities  Respiratory:  rhonchorous bilateral breath sounds with rales to apices and wheezes  bdomen:   NABS, soft, NT/ND  MSK:   Normal tone and bulk, no LEE  Neuro:  Grossly intact  Data Reviewed: Basic Metabolic Panel:  Recent Labs Lab 12/06/15 1156 12/06/15 2105 12/10/15 2220  NA 135 137 134*  K 4.3 4.0 5.1  CL 101 101 102  CO2 26 26 24   GLUCOSE 594* 208* 498*  BUN 43* 42* 34*  CREATININE 1.97* 2.04* 1.64*  CALCIUM 8.6* 8.3*  8.4*   Liver Function Tests:  Recent Labs Lab 12/06/15 1156 12/10/15 2220  AST 30 35  ALT 28 23  ALKPHOS 160* 109  BILITOT 0.7 0.6  PROT 6.1* 5.5*  ALBUMIN 2.6* 2.2*    Recent Labs Lab 12/06/15 1156 12/10/15 2220  LIPASE 23 17   No results for input(s): AMMONIA in the last 168 hours. CBC:  Recent Labs Lab 12/06/15 1156 12/06/15 2105 12/10/15 2220  WBC 5.0 5.3 6.3  NEUTROABS 3.8  --   --   HGB 9.0* 8.3* 9.3*  HCT 27.0* 24.0* 28.5*  MCV 86.8 87.3 88.5  PLT 196 180 224    Recent Results (from the past 240 hour(s))  Blood culture (routine x 2)     Status: None   Collection Time: 12/04/15  3:45 AM  Result Value Ref Range Status   Specimen Description BLOOD RIGHT HAND  Final   Special Requests IN PEDIATRIC BOTTLE 4CC  Final   Culture   Final    NO GROWTH 5 DAYS Performed at [facility]    Report Status 12/09/2015 FINAL  Final  Blood culture (routine x 2)     Status: None   Collection Time: 12/04/15  4:50 AM  Result Value Ref Range Status   Specimen Description BLOOD LEFT WRIST  Final   Special Requests BOTTLES DRAWN AEROBIC AND ANAEROBIC 5CC  Final   Culture   Final    NO GROWTH 5 DAYS Performed at [facility]    Report Status 12/09/2015 FINAL  Final  MRSA PCR Screening     Status: None   Collection Time: 12/07/15  7:17 AM  Result Value Ref Range Status   MRSA by PCR NEGATIVE NEGATIVE Final    Comment:        The GeneXpert MRSA Assay (FDA approved for NASAL specimens only), is one component of a comprehensive MRSA colonization surveillance program. It is not intended to diagnose MRSA infection nor to guide or monitor treatment for MRSA infections.   Blood culture (routine x 2)     Status: None (Preliminary result)   Collection Time: 12/11/15  8:34 AM  Result Value Ref Range Status   Specimen Description BLOOD LEFT FOREARM  Final   Special Requests IN PEDIATRIC BOTTLE 3CC  Final   Culture NO GROWTH 1 DAY  Final   Report Status  PENDING  Incomplete  Blood culture (routine x 2)     Status: None (Preliminary result)   Collection Time: 12/11/15  9:15 AM  Result Value Ref Range Status   Specimen Description BLOOD ARM LEFT UPPER  Final   Special Requests IN PEDIATRIC BOTTLE 3CC  Final   Culture NO GROWTH 1 DAY  Final   Report Status PENDING  Incomplete  Studies: Dg Chest 2 View  12/11/2015  CLINICAL DATA:  65 year old male with shortness of breath and cough for 3 weeks. EXAM: CHEST  2 VIEW COMPARISON:  12/06/2015 and prior exams FINDINGS: Mild cardiomegaly again noted. Very small bilateral pleural effusions have increased. Diffuse peribronchial thickening is again noted. New patchy right upper lobe opacities and unchanged patchy lower lobe opacities are noted and suspicious for bronchopneumonia. There is no evidence of pneumothorax. IMPRESSION: New patchy right upper lobe opacities and unchanged patchy lower lobe opacity suspicious for bronchopneumonia. Slightly increasing very small bilateral pleural effusions. Mild cardiomegaly. Electronically Signed   By: Harmon PierJeffrey  Hu M.D.   On: 12/11/2015 07:59    Scheduled Meds: . ceFEPime (MAXIPIME) IV  1 g Intravenous Q12H  . diltiazem  120 mg Oral Q12H  . enoxaparin (LOVENOX) injection  40 mg Subcutaneous Q24H  . folic acid  1 mg Oral Daily  . guaiFENesin  1,200 mg Oral BID  . hydrALAZINE  25 mg Oral 4 times per day  . insulin aspart  0-15 Units Subcutaneous TID WC  . insulin aspart  0-5 Units Subcutaneous QHS  . insulin aspart  0-5 Units Subcutaneous QHS  . insulin detemir  15 Units Subcutaneous QHS  . multivitamin with minerals  1 tablet Oral Daily  . thiamine  100 mg Oral Daily   Or  . thiamine  100 mg Intravenous Daily  . vancomycin  750 mg Intravenous Q12H   Continuous Infusions:   Active Problems:   Noncompliance with medications   Alcohol abuse   Essential hypertension   HCAP (healthcare-associated pneumonia)   CKD (chronic kidney disease)   Diabetes  mellitus with complication (HCC)   Homeless   Anemia of chronic disease    Time spent: 30 min    [place], [facility] LPMACKENZIE  [facility] Pager [phone]. If 7PM-7AM, please contact night-coverage at [email or web address], password [email or web address] 12/12/2015, 3:05 PM

## 2015-12-12 NOTE — Progress Notes (Signed)
Entered patient room to silence IV. Patient requested that I take his "check to the cafeteria and have it cashed". Informed patient that the [employer] [facility] not [facility]. Patient became upset and began yelling and cursing. Accused staff of lying to him and just not wanting to do my job. Attempted to de escalate patient.  Also noted patient room to be disshelved. Attempted to tidy patient room and bedside table. Patient calmed down and requested that the credit union be contacted to determine if they could cash his check. Informed patient that he would have to personally go to the credit union and that the RN would have to ensure that he was safe to travel off the department. Suggested waiting until he was discharged to go off the floor.   Clayton [name], Charlyne Qualeamara Johnson

## 2015-12-12 NOTE — Progress Notes (Signed)
Patient has been refusing cardiac monitoring, skin assessment and also labs, tried to convince and educate but in vain, MD made aware.

## 2015-12-12 NOTE — Progress Notes (Signed)
I was present when NT wanted to change the soiled sheets and bed but patient rudely refused to change the sheets, tried to convince but he was irritated and angry.

## 2015-12-13 ENCOUNTER — Inpatient Hospital Stay (HOSPITAL_COMMUNITY): Payer: Medicaid Other

## 2015-12-13 LAB — GLUCOSE, CAPILLARY
GLUCOSE-CAPILLARY: 183 mg/dL — AB (ref 65–99)
GLUCOSE-CAPILLARY: 206 mg/dL — AB (ref 65–99)

## 2015-12-13 MED ORDER — FERROUS SULFATE 325 (65 FE) MG PO TABS: 325.0000 mg | ORAL_TABLET | Freq: Every day | ORAL | Status: AC

## 2015-12-13 MED ORDER — LEVOFLOXACIN 750 MG PO TABS: 750.0000 mg | ORAL_TABLET | ORAL | Status: DC

## 2015-12-13 MED ORDER — LEVOFLOXACIN 750 MG PO TABS
750.0000 mg | ORAL_TABLET | ORAL | Status: DC
  Administered 2015-12-13: 750 mg via ORAL
  Filled 2015-12-13: qty 1

## 2015-12-13 NOTE — Progress Notes (Addendum)
PT Cancellation Note  Patient Details Name: Clayton [name] MRN: [id number] DOB: 1951/10/23   Cancelled Treatment:    Reason Eval/Treat Not Completed: Patient declined, no reason specified  Refuses to work with therapy. States he does not feel like getting out of bed. Explained role of PT and request to assess ambulatory oxygen saturations. Still declines. Will attempt tomorrow.  [name], [name] 12/13/2015, 11:57 AM  [name], PT [phone]

## 2015-12-13 NOTE — Progress Notes (Signed)
Pharmacy Antibiotic Follow-up Note  Lyndel SafeVincent L Gervacio is a [age] y.o. year-old male admitted on 12/11/2015.   The patient is currently on day #3 of Vanc and Cefepime for HCAP.  Assessment/Plan:  Change to Levaquin 750 mg PO q48hrs today.  Separate doses from Ferrous sulfate daily, currently scheduled with breakfast.  Temp (24hrs), Avg:98.5 F (36.9 C), Min:98.2 F (36.8 C), Max:98.7 F (37.1 C)   Recent Labs Lab 12/06/15 1156 12/06/15 2105 12/10/15 2220  WBC 5.0 5.3 6.3    Recent Labs Lab 12/06/15 1156 12/06/15 2105 12/10/15 2220  CREATININE 1.97* 2.04* 1.64*   Estimated Creatinine Clearance: 49.7 mL/min (by C-G formula based on Cr of 1.64).    Allergies  Allergen Reactions  . Latex Rash    Antimicrobials this admission: Vancomycin 1/2 >> 1/4 Cefepime 1/2 >> 1/4 Levaquin PO 1/4>>  Levels/dose changes this admission:  none  Microbiology results: 12/11/15 Blood x 2 - no growth x 1 day so far Sputum: no sample  12/07/15 MRSA PCR: neg   Dennie Fettersgan, Jeziah Kretschmer Donovan, RPh Pager: [phone] 12/13/2015 11:09 AM

## 2015-12-13 NOTE — Progress Notes (Signed)
Lyndel SafeVincent L Resh to be D/C'd Home per MD order.  Discussed prescriptions and follow up appointments with the patient. Prescriptions given to patient, medication list explained in detail. Pt verbalized understanding.    Medication List    TAKE these medications        diltiazem 120 MG 12 hr capsule  Commonly known as:  CARDIZEM SR  Take 1 capsule (120 mg total) by mouth every 12 (twelve) hours.     ferrous sulfate 325 (65 FE) MG tablet  Take 1 tablet (325 mg total) by mouth daily with breakfast.     glyBURIDE 5 MG tablet  Commonly known as:  DIABETA  Take 2 tablets (10 mg total) by mouth 2 (two) times daily with a meal.     hydrALAZINE 25 MG tablet  Commonly known as:  APRESOLINE  Take 1 tablet (25 mg total) by mouth every 6 (six) hours.     levofloxacin 750 MG tablet  Commonly known as:  LEVAQUIN  Take 1 tablet (750 mg total) by mouth every other day.     metFORMIN 1000 MG tablet  Commonly known as:  GLUCOPHAGE  Take 1 tablet (1,000 mg total) by mouth 2 (two) times daily.        Filed Vitals:   12/13/15 0455 12/13/15 0907  BP: 144/68 149/82  Pulse: 81 87  Temp: 98.6 F (37 C) 98.2 F (36.8 C)  Resp: 19 20    Skin clean, dry and intact without evidence of skin break down, no evidence of skin tears noted. IV catheter discontinued intact. Site without signs and symptoms of complications. Dressing and pressure applied. Pt denies pain at this time. No complaints noted.  An After Visit Summary was printed and given to the patient. Patient escorted via WC, and D/C home via private auto.  Jonell CluckKadeesha Nicolena Schurman RN [facility] Phone [phone]

## 2015-12-15 LAB — QUANTIFERON IN TUBE
QFT TB AG MINUS NIL VALUE: 0.14 [IU]/mL
QUANTIFERON MITOGEN VALUE: 0.42 IU/mL
QUANTIFERON TB AG VALUE: 0.22 [IU]/mL
QUANTIFERON TB GOLD: UNDETERMINED
Quantiferon Nil Value: 0.08 IU/mL

## 2015-12-15 LAB — QUANTIFERON TB GOLD ASSAY (BLOOD)

## 2015-12-15 NOTE — Progress Notes (Signed)
Late Entry 12/15/2015 1:02 PM On 12/14/15 ~8am alerted by charge nurse that patient had accused NT around 2am of stealing his check after she completed his bath.   This department director had spoken with the patient on 12/13/15 regarding his request to take the check and have it cashed.  12/14/15 Spoke with nurse secretary who stated that she did see the check for 283.00 and that it was placed in the pocket of patients black jacket. Went into patients room and asked him if it was okay for me to look in his jacket pocket as that was the last place the check was seen. Patient agreed. Check was located in pocket of black jacket. Patient was thankful.  Patient being discharged and reported that someone was supposed to wash his clothes. Noted bag of dirty clothes in the closet which smelled of urine. Went to [employer] clothes closet and gave patient clean underwear, tshirt, jacket and sweat pants for discharge . Patient was thankful.   Lilybelle Mayeda, Charlyne Qualeamara Johnson

## 2015-12-15 NOTE — Discharge Summary (Addendum)
[facility] Discharge Summary   Patient: Clayton [name] ZOX:[id number]   PCP: PROVIDER NOT IN SYSTEM DOB: 08-13-1951   Date of admission: 12/11/2015   Date of discharge: 12/13/2015    Discharge Diagnoses:  Active Problems:   Noncompliance with medications   Alcohol abuse   Essential hypertension   HCAP (healthcare-associated pneumonia)   CKD (chronic kidney disease)   Diabetes mellitus with complication (HCC)   Homeless   Anemia of chronic disease   Recommendations for Outpatient Follow-up:  1. Please follow-up with your primary care doctor regarding follow up with pneumonia  Diet recommendation: Heart healthy and carb modified diet  Activity: The patient is advised to gradually reintroduce usual activities.  Discharge Condition: fair  History of present illness: As per the H and P dictated on admission, "Patient is an extremely poor historian. History provided by EDP, nursing notes and minimally by patient as he is minimally to non-participatory. Of note history is different from what is documented in the chart compared to what patient gave to me at time of admission. Patient with ongoing cough and intermittent shortness of breath for several weeks to months. Patient apparently initially presented to the ED with complaints of abdominal pain nausea and vomiting. He subsequently left the ED and then return to short time later with complaints of cough and shortness of breath. Patient is not taken any of his medications since being discharged from the hospital on 12/07/2019 1640s admitted for hyperglycemia and medical noncompliance. Current episode is associated with a "wet cough, " and flulike symptoms per patient. Denies any current abdominal pain. Denies fevers. Patient did not try anything for her symptoms to make them better or worse."  Hospital Course:  Summary of his active problems in the hospital is as following. Healthcare associated pneumonia Presented with cough, recently  discharged from the hospital, No leukocytosis on admission, influenza PCR negative, QUANTIFERON indeterminant, HIV nonreactive, blood culture no growth, MRSA negative. Patient has refused follow-up blood work here in the hospital Since workup as mentioned above was unremarkable and patient remained hemodynamically stable without any fever or hypoxia or leukocytosis, patient will be discharged on levofloxacin to complete a 7 day treatment.  Essential hypertension Continuing home medication on discharge Most likely noncompliant.  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  Patient was ambulatory without any assistance. Patient refuses physical therapy evaluation. On the day of the discharge the patient's vitals remained stable, and no other acute medical condition were reported by patient. the patient was felt safe to be discharge at home.  Procedures and Results:  NONE   Consultations:  NONE  Discharge Exam: Filed Weights   12/12/15 0512  Weight: 76.4 kg (168 lb 6.9 oz)   Filed Vitals:   12/13/15 0455 12/13/15 0907  BP: 144/68 149/82  Pulse: 81 87  Temp: 98.6 F (37 C) 98.2 F (36.8 C)  Resp: 19 20   General: Appear in no distress, no Rash; Oral Mucosa moist. Cardiovascular: S1 and S2 Present, no Murmur, no JVD Respiratory: Bilateral Air entry present and Clear to Auscultation, no Crackles, no wheezes Abdomen: Bowel Sound present, Soft and no tenderness Extremities: no Pedal edema, no calf tenderness Neurology: Grossly no focal neuro deficit.  DISCHARGE MEDICATION: Discharge Instructions    Diet - low  sodium heart healthy    Complete by:  As directed      Diet Carb Modified    Complete by:  As directed       Discharge instructions    Complete by:  As directed   It is important that you read following instructions as well as go over your medication list with RN to help you understand your care after this hospitalization.  Discharge Instructions: Please request your primary care physician to go over all Hospital Tests and Procedure/Radiological results at the follow up,  Please get all Hospital records sent to your PCP by signing hospital release before you go home.   You were cared for by a hospitalist during your hospital stay. If you have any questions about your discharge medications or the care you received while you were in the hospital after you are discharged, you can call the unit and ask to speak with the hospitalist on call if the hospitalist that took care of you is not available.  Once you are discharged, your primary care physician will handle any further medical issues. Please note that NO REFILLS for any discharge medications will be authorized once you are discharged, as it is imperative that you return to your primary care physician (or establish a relationship with a primary care physician if you do not have one) for your aftercare needs so that they can reassess your need for medications and monitor your lab values. You Must read complete instructions/literature along with all the possible adverse reactions/side effects for all the Medicines you take and that have been prescribed to you. Take any new Medicines after you have completely understood and accept all the possible adverse reactions/side effects. Wear Seat belts while driving. If you have smoked or chewed Tobacco in the last 2 yrs please stop smoking and/or stop any Recreational drug use.     Increase activity slowly    Complete by:  As directed           Discharge Medication List as of 12/13/2015  3:28 PM    START taking these medications   Details  ferrous sulfate 325 (65 FE) MG tablet Take 1 tablet (325 mg total) by  mouth daily with breakfast., Starting 12/13/2015, Until Discontinued, Print    levofloxacin (LEVAQUIN) 750 MG tablet Take 1 tablet (750 mg total) by mouth every other day., Starting 12/13/2015, Until Sun 12/17/15, Print      CONTINUE these medications which have NOT CHANGED   Details  diltiazem (CARDIZEM SR) 120 MG 12 hr capsule Take 1 capsule (120 mg total) by mouth every 12 (twelve) hours., Starting 12/05/2015, Until Discontinued, Print    glyBURIDE (DIABETA) 5 MG tablet Take 2 tablets (10 mg total) by mouth 2 (two) times daily with a meal., Starting 12/05/2015, Until Discontinued, Print    hydrALAZINE (APRESOLINE) 25 MG tablet Take 1 tablet (25 mg total) by mouth every 6 (six) hours., Starting 12/05/2015, Until Discontinued, Print    metFORMIN (GLUCOPHAGE) 1000 MG tablet Take 1 tablet (1,000 mg total) by mouth 2 (two) times daily., Starting 12/11/2015, Until Discontinued, Print       Allergies  Allergen Reactions  . Latex Rash   Follow-up Information    Schedule an appointment as soon as possible for a visit with [facility].   Why:  Medicaid [place] access lists  sickle cell clinic as your assigned Dr if this is not correct contadt DSS to correct this [phone]  Contact information:   [address] [place] [phone]       The results of significant diagnostics from this hospitalization (including imaging, microbiology, ancillary and laboratory) are listed below for reference.    Significant Diagnostic Studies: Dg Chest 2 View  12/11/2015  CLINICAL DATA:  65 year old male with shortness of breath and cough for 3 weeks. EXAM: CHEST  2 VIEW COMPARISON:  12/06/2015 and prior exams FINDINGS: Mild cardiomegaly again noted. Very small bilateral pleural effusions have increased. Diffuse peribronchial thickening is again noted. New patchy right upper lobe opacities and unchanged patchy lower lobe opacities are noted and suspicious for  bronchopneumonia. There is no evidence of pneumothorax. IMPRESSION: New patchy right upper lobe opacities and unchanged patchy lower lobe opacity suspicious for bronchopneumonia. Slightly increasing very small bilateral pleural effusions. Mild cardiomegaly. Electronically Signed   By: Harmon PierJeffrey  Hu M.D.   On: 12/11/2015 07:59   Dg Chest 2 View  12/06/2015  CLINICAL DATA:  Cough. EXAM: CHEST  2 VIEW COMPARISON:  12/04/2015 and 10/31/2015 FINDINGS: There is prominent peribronchial thickening. On the lateral view there is a small patchy infiltrate posteriorly and inferiorly. Heart size and pulmonary vascularity are normal. No osseous abnormality. IMPRESSION: Small patchy infiltrate at 1 of the lung bases posteriorly and inferiorly. Prominent bronchitic changes. Electronically Signed   By: Francene BoyersJames  Maxwell M.D.   On: 12/06/2015 14:39   Dg Chest 2 View  12/04/2015  CLINICAL DATA:  Acute onset of hyperglycemia. Dizziness, cough and congestion. Vomiting and diarrhea. Initial encounter. EXAM: CHEST  2 VIEW COMPARISON:  Chest radiograph performed 10/31/2015 FINDINGS: The lungs are well-aerated. Mild vascular congestion is noted. Peribronchial thickening is noted. Minimal bilateral opacities may reflect atelectasis or mild pneumonia, depending on the patient's symptoms. There is no evidence of pleural effusion or pneumothorax. The heart is mildly enlarged. No acute osseous abnormalities are seen. IMPRESSION: Mild vascular congestion and mild cardiomegaly. Peribronchial thickening noted. Minimal bilateral opacities may reflect atelectasis or possibly mild pneumonia, depending on the patient's symptoms. Electronically Signed   By: Roanna RaiderJeffery  Chang M.D.   On: 12/04/2015 03:32   Dg Chest Port 1 View  12/13/2015  CLINICAL DATA:  Healthcare associated pneumonia, diabetes, chronic CHF, chronic renal insufficiency, substance abuse, current smoker. EXAM: PORTABLE CHEST 1 VIEW COMPARISON:  PA and lateral chest x-ray of December 11, 2015 FINDINGS: The lungs are slightly less well inflated. There remain confluent interstitial and alveolar opacities bilaterally. These are more conspicuous today. The heart is top-normal in size. The pulmonary vascularity is prominent centrally but no definite cephalization of the vascular pattern is observed. The bony thorax exhibits no acute abnormality. IMPRESSION: Bilateral interstitial and alveolar pneumonia with chronic low-grade compensated CHF. The appearance of the chest has deteriorated somewhat since yesterday's study which may be in part due to the portable technique versus the PA and lateral technique. Electronically Signed   By: David  SwazilandJordan M.D.   On: 12/13/2015 08:01    Microbiology: Recent Results (from the past 240 hour(s))  MRSA PCR Screening     Status: None   Collection Time: 12/07/15  7:17 AM  Result Value Ref Range Status   MRSA by PCR NEGATIVE NEGATIVE Final    Comment:        The GeneXpert MRSA Assay (FDA approved for NASAL specimens only), is one component of a comprehensive MRSA colonization surveillance program. It is not intended to diagnose MRSA infection nor to guide or monitor treatment for MRSA infections.  Blood culture (routine x 2)     Status: None (Preliminary result)   Collection Time: 12/11/15  8:34 AM  Result Value Ref Range Status   Specimen Description BLOOD LEFT FOREARM  Final   Special Requests IN PEDIATRIC BOTTLE 3CC  Final   Culture NO GROWTH 4 DAYS  Final   Report Status PENDING  Incomplete  Blood culture (routine x 2)     Status: None (Preliminary result)   Collection Time: 12/11/15  9:15 AM  Result Value Ref Range Status   Specimen Description BLOOD ARM LEFT UPPER  Final   Special Requests IN PEDIATRIC BOTTLE 3CC  Final   Culture NO GROWTH 4 DAYS  Final   Report Status PENDING  Incomplete     Labs: CBC:  Recent Labs Lab 12/10/15 2220  WBC 6.3  HGB 9.3*  HCT 28.5*  MCV 88.5  PLT 224   Basic Metabolic Panel:  Recent  Labs Lab 12/10/15 2220  NA 134*  K 5.1  CL 102  CO2 24  GLUCOSE 498*  BUN 34*  CREATININE 1.64*  CALCIUM 8.4*   Liver Function Tests:  Recent Labs Lab 12/10/15 2220  AST 35  ALT 23  ALKPHOS 109  BILITOT 0.6  PROT 5.5*  ALBUMIN 2.2*    Recent Labs Lab 12/10/15 2220  LIPASE 17   BNP (last 3 results)  Recent Labs  08/17/15 0330 12/04/15 0422  BNP 303.5* 557.6*    CBG:  Recent Labs Lab 12/12/15 1151 12/12/15 1700 12/12/15 2205 12/13/15 0757 12/13/15 1216  GLUCAP 361* 336* 265* 183* 206*    Time spent: 30 minutes  Signed:  Kaydon Creedon  [facility] 12/13/2015, 3:11 PM

## 2015-12-16 ENCOUNTER — Encounter (HOSPITAL_COMMUNITY): Payer: Self-pay | Admitting: Emergency Medicine

## 2015-12-16 ENCOUNTER — Emergency Department (HOSPITAL_COMMUNITY): Payer: Medicaid Other

## 2015-12-16 ENCOUNTER — Inpatient Hospital Stay (HOSPITAL_COMMUNITY)
Admission: EM | Admit: 2015-12-16 | Discharge: 2015-12-24 | DRG: 193 | Disposition: A | Discharge status: Skilled Nursing Facility | Payer: Medicaid Other | Attending: Internal Medicine | Admitting: Internal Medicine

## 2015-12-16 DIAGNOSIS — G43909 Migraine, unspecified, not intractable, without status migrainosus: Secondary | ICD-10-CM | POA: Diagnosis present

## 2015-12-16 DIAGNOSIS — Z5900 Homelessness unspecified: Secondary | ICD-10-CM

## 2015-12-16 DIAGNOSIS — F1010 Alcohol abuse, uncomplicated: Secondary | ICD-10-CM | POA: Diagnosis present

## 2015-12-16 DIAGNOSIS — I10 Essential (primary) hypertension: Secondary | ICD-10-CM | POA: Diagnosis present

## 2015-12-16 DIAGNOSIS — W19XXXA Unspecified fall, initial encounter: Secondary | ICD-10-CM

## 2015-12-16 DIAGNOSIS — I130 Hypertensive heart and chronic kidney disease with heart failure and stage 1 through stage 4 chronic kidney disease, or unspecified chronic kidney disease: Secondary | ICD-10-CM | POA: Diagnosis present

## 2015-12-16 DIAGNOSIS — F17210 Nicotine dependence, cigarettes, uncomplicated: Secondary | ICD-10-CM | POA: Diagnosis present

## 2015-12-16 DIAGNOSIS — E1165 Type 2 diabetes mellitus with hyperglycemia: Secondary | ICD-10-CM | POA: Diagnosis present

## 2015-12-16 DIAGNOSIS — F1410 Cocaine abuse, uncomplicated: Secondary | ICD-10-CM | POA: Diagnosis present

## 2015-12-16 DIAGNOSIS — I5033 Acute on chronic diastolic (congestive) heart failure: Secondary | ICD-10-CM | POA: Diagnosis present

## 2015-12-16 DIAGNOSIS — I5032 Chronic diastolic (congestive) heart failure: Secondary | ICD-10-CM

## 2015-12-16 DIAGNOSIS — E1121 Type 2 diabetes mellitus with diabetic nephropathy: Secondary | ICD-10-CM | POA: Diagnosis present

## 2015-12-16 DIAGNOSIS — Y95 Nosocomial condition: Secondary | ICD-10-CM | POA: Diagnosis present

## 2015-12-16 DIAGNOSIS — N179 Acute kidney failure, unspecified: Secondary | ICD-10-CM | POA: Diagnosis not present

## 2015-12-16 DIAGNOSIS — D638 Anemia in other chronic diseases classified elsewhere: Secondary | ICD-10-CM | POA: Diagnosis present

## 2015-12-16 DIAGNOSIS — R9431 Abnormal electrocardiogram [ECG] [EKG]: Secondary | ICD-10-CM | POA: Diagnosis present

## 2015-12-16 DIAGNOSIS — I4581 Long QT syndrome: Secondary | ICD-10-CM | POA: Diagnosis present

## 2015-12-16 DIAGNOSIS — N189 Chronic kidney disease, unspecified: Secondary | ICD-10-CM

## 2015-12-16 DIAGNOSIS — F1910 Other psychoactive substance abuse, uncomplicated: Secondary | ICD-10-CM | POA: Diagnosis present

## 2015-12-16 DIAGNOSIS — E870 Hyperosmolality and hypernatremia: Secondary | ICD-10-CM | POA: Diagnosis present

## 2015-12-16 DIAGNOSIS — Z7984 Long term (current) use of oral hypoglycemic drugs: Secondary | ICD-10-CM

## 2015-12-16 DIAGNOSIS — Z9114 Patient's other noncompliance with medication regimen: Secondary | ICD-10-CM | POA: Diagnosis not present

## 2015-12-16 DIAGNOSIS — J189 Pneumonia, unspecified organism: Secondary | ICD-10-CM | POA: Diagnosis not present

## 2015-12-16 DIAGNOSIS — R0602 Shortness of breath: Secondary | ICD-10-CM | POA: Diagnosis not present

## 2015-12-16 DIAGNOSIS — J9601 Acute respiratory failure with hypoxia: Secondary | ICD-10-CM | POA: Diagnosis not present

## 2015-12-16 DIAGNOSIS — K219 Gastro-esophageal reflux disease without esophagitis: Secondary | ICD-10-CM | POA: Diagnosis present

## 2015-12-16 DIAGNOSIS — N183 Chronic kidney disease, stage 3 (moderate): Secondary | ICD-10-CM | POA: Diagnosis not present

## 2015-12-16 DIAGNOSIS — Z91040 Latex allergy status: Secondary | ICD-10-CM | POA: Diagnosis not present

## 2015-12-16 DIAGNOSIS — E1122 Type 2 diabetes mellitus with diabetic chronic kidney disease: Secondary | ICD-10-CM | POA: Diagnosis present

## 2015-12-16 DIAGNOSIS — R739 Hyperglycemia, unspecified: Secondary | ICD-10-CM | POA: Diagnosis present

## 2015-12-16 DIAGNOSIS — I5031 Acute diastolic (congestive) heart failure: Secondary | ICD-10-CM | POA: Diagnosis not present

## 2015-12-16 DIAGNOSIS — E1100 Type 2 diabetes mellitus with hyperosmolarity without nonketotic hyperglycemic-hyperosmolar coma (NKHHC): Secondary | ICD-10-CM | POA: Diagnosis present

## 2015-12-16 DIAGNOSIS — S0990XA Unspecified injury of head, initial encounter: Secondary | ICD-10-CM

## 2015-12-16 DIAGNOSIS — Z452 Encounter for adjustment and management of vascular access device: Secondary | ICD-10-CM

## 2015-12-16 LAB — URINALYSIS, ROUTINE W REFLEX MICROSCOPIC
Bilirubin Urine: NEGATIVE
Glucose, UA: >1000 mg/dL — AB
Ketones, ur: NEGATIVE mg/dL
LEUKOCYTES UA: NEGATIVE
Nitrite: NEGATIVE
Specific Gravity, Urine: 1.031 — ABNORMAL HIGH (ref 1.005–1.030)
pH: 5.5 (ref 5.0–8.0)

## 2015-12-16 LAB — CBC WITH DIFFERENTIAL/PLATELET
Basophils Absolute: 0 10*3/uL (ref 0.0–0.1)
Basophils Relative: 0 %
Eosinophils Absolute: 0 10*3/uL (ref 0.0–0.7)
Eosinophils Relative: 0 %
HEMATOCRIT: 26.4 % — AB (ref 39.0–52.0)
Hemoglobin: 8.4 g/dL — ABNORMAL LOW (ref 13.0–17.0)
LYMPHS PCT: 5 %
Lymphs Abs: 0.5 10*3/uL — ABNORMAL LOW (ref 0.7–4.0)
MCH: 28.8 pg (ref 26.0–34.0)
MCHC: 31.8 g/dL (ref 30.0–36.0)
MCV: 90.4 fL (ref 78.0–100.0)
MONO ABS: 0.7 10*3/uL (ref 0.1–1.0)
MONOS PCT: 7 %
NEUTROS ABS: 8.8 10*3/uL — AB (ref 1.7–7.7)
Neutrophils Relative %: 88 %
Platelets: 313 10*3/uL (ref 150–400)
RBC: 2.92 MIL/uL — ABNORMAL LOW (ref 4.22–5.81)
RDW: 14.3 % (ref 11.5–15.5)
WBC: 10 10*3/uL (ref 4.0–10.5)

## 2015-12-16 LAB — COMPREHENSIVE METABOLIC PANEL
ALT: 19 U/L (ref 17–63)
ANION GAP: 12 (ref 5–15)
AST: 20 U/L (ref 15–41)
Albumin: 2 g/dL — ABNORMAL LOW (ref 3.5–5.0)
Alkaline Phosphatase: 99 U/L (ref 38–126)
BILIRUBIN TOTAL: 0.2 mg/dL — AB (ref 0.3–1.2)
BUN: 35 mg/dL — AB (ref 6–20)
CO2: 21 mmol/L — ABNORMAL LOW (ref 22–32)
Calcium: 8.6 mg/dL — ABNORMAL LOW (ref 8.9–10.3)
Chloride: 102 mmol/L (ref 101–111)
Creatinine, Ser: 1.93 mg/dL — ABNORMAL HIGH (ref 0.61–1.24)
GFR, EST AFRICAN AMERICAN: 41 mL/min — AB (ref >60)
GFR, EST NON AFRICAN AMERICAN: 36 mL/min — AB (ref >60)
Glucose, Bld: 615 mg/dL (ref 65–99)
POTASSIUM: 4.4 mmol/L (ref 3.5–5.1)
Sodium: 135 mmol/L (ref 135–145)
TOTAL PROTEIN: 5.6 g/dL — AB (ref 6.5–8.1)

## 2015-12-16 LAB — I-STAT CHEM 8, ED
BUN: 35 mg/dL — AB (ref 6–20)
CREATININE: 1.7 mg/dL — AB (ref 0.61–1.24)
Calcium, Ion: 1.12 mmol/L — ABNORMAL LOW (ref 1.13–1.30)
Chloride: 102 mmol/L (ref 101–111)
GLUCOSE: 610 mg/dL — AB (ref 65–99)
HCT: 29 % — ABNORMAL LOW (ref 39.0–52.0)
Hemoglobin: 9.9 g/dL — ABNORMAL LOW (ref 13.0–17.0)
POTASSIUM: 4.3 mmol/L (ref 3.5–5.1)
Sodium: 136 mmol/L (ref 135–145)
TCO2: 22 mmol/L (ref 0–100)

## 2015-12-16 LAB — URINE MICROSCOPIC-ADD ON

## 2015-12-16 LAB — I-STAT CG4 LACTIC ACID, ED: Lactic Acid, Venous: 1.96 mmol/L (ref 0.5–2.0)

## 2015-12-16 LAB — TROPONIN I: TROPONIN I: 0.07 ng/mL — AB (ref <0.031)

## 2015-12-16 LAB — ETHANOL: Alcohol, Ethyl (B): <5 mg/dL (ref <5)

## 2015-12-16 LAB — RAPID URINE DRUG SCREEN, HOSP PERFORMED
AMPHETAMINES: NOT DETECTED
BARBITURATES: NOT DETECTED
BENZODIAZEPINES: NOT DETECTED
Cocaine: NOT DETECTED
Opiates: NOT DETECTED
TETRAHYDROCANNABINOL: NOT DETECTED

## 2015-12-16 LAB — CULTURE, BLOOD (ROUTINE X 2)
Culture: NO GROWTH
Culture: NO GROWTH

## 2015-12-16 LAB — CBG MONITORING, ED: GLUCOSE-CAPILLARY: 537 mg/dL — AB (ref 65–99)

## 2015-12-16 LAB — GLUCOSE, CAPILLARY: GLUCOSE-CAPILLARY: 419 mg/dL — AB (ref 65–99)

## 2015-12-16 LAB — PHOSPHORUS: Phosphorus: 5 mg/dL — ABNORMAL HIGH (ref 2.5–4.6)

## 2015-12-16 LAB — MAGNESIUM: Magnesium: 2.1 mg/dL (ref 1.7–2.4)

## 2015-12-16 LAB — STREP PNEUMONIAE URINARY ANTIGEN: Strep Pneumo Urinary Antigen: NEGATIVE

## 2015-12-16 LAB — BRAIN NATRIURETIC PEPTIDE: B NATRIURETIC PEPTIDE 5: 1348.3 pg/mL — AB (ref 0.0–100.0)

## 2015-12-16 MED ORDER — THIAMINE HCL 100 MG/ML IJ SOLN: 100.0000 mg | Freq: Every day | INTRAMUSCULAR | Status: DC

## 2015-12-16 MED ORDER — LORAZEPAM 1 MG PO TABS: 1.0000 mg | ORAL_TABLET | Freq: Four times a day (QID) | ORAL | Status: AC | PRN

## 2015-12-16 MED ORDER — INSULIN ASPART 100 UNIT/ML ~~LOC~~ SOLN
0.0000 [IU] | Freq: Three times a day (TID) | SUBCUTANEOUS | Status: DC
  Administered 2015-12-16 – 2015-12-17 (×2): 15 [IU] via SUBCUTANEOUS
  Administered 2015-12-17: 11 [IU] via SUBCUTANEOUS
  Administered 2015-12-18: 5 [IU] via SUBCUTANEOUS
  Administered 2015-12-18: 11 [IU] via SUBCUTANEOUS
  Administered 2015-12-18: 8 [IU] via SUBCUTANEOUS
  Administered 2015-12-19: 3 [IU] via SUBCUTANEOUS
  Administered 2015-12-19: 15 [IU] via SUBCUTANEOUS
  Administered 2015-12-20 (×2): 5 [IU] via SUBCUTANEOUS
  Administered 2015-12-20 – 2015-12-21 (×2): 3 [IU] via SUBCUTANEOUS
  Administered 2015-12-21: 5 [IU] via SUBCUTANEOUS
  Administered 2015-12-22: 15 [IU] via SUBCUTANEOUS
  Administered 2015-12-22 (×2): 8 [IU] via SUBCUTANEOUS
  Administered 2015-12-23: 15 [IU] via SUBCUTANEOUS
  Administered 2015-12-23: 8 [IU] via SUBCUTANEOUS
  Administered 2015-12-23: 5 [IU] via SUBCUTANEOUS
  Administered 2015-12-24: 3 [IU] via SUBCUTANEOUS
  Administered 2015-12-24: 5 [IU] via SUBCUTANEOUS
  Filled 2015-12-16: qty 1

## 2015-12-16 MED ORDER — CEFEPIME HCL 1 G IJ SOLR
1.0000 g | Freq: Three times a day (TID) | INTRAMUSCULAR | Status: DC
  Administered 2015-12-16: 1 g via INTRAVENOUS
  Filled 2015-12-16 (×5): qty 1

## 2015-12-16 MED ORDER — INSULIN ASPART 100 UNIT/ML ~~LOC~~ SOLN
0.0000 [IU] | Freq: Every day | SUBCUTANEOUS | Status: DC
  Administered 2015-12-17 – 2015-12-22 (×2): 2 [IU] via SUBCUTANEOUS

## 2015-12-16 MED ORDER — HYDRALAZINE HCL 25 MG PO TABS
25.0000 mg | ORAL_TABLET | Freq: Four times a day (QID) | ORAL | Status: DC
  Administered 2015-12-16 – 2015-12-18 (×7): 25 mg via ORAL
  Filled 2015-12-16 (×7): qty 1

## 2015-12-16 MED ORDER — DILTIAZEM HCL ER 60 MG PO CP12
120.0000 mg | ORAL_CAPSULE | Freq: Two times a day (BID) | ORAL | Status: DC
  Administered 2015-12-16 – 2015-12-24 (×16): 120 mg via ORAL
  Filled 2015-12-16 (×17): qty 2

## 2015-12-16 MED ORDER — LORAZEPAM 2 MG/ML IJ SOLN
0.0000 mg | Freq: Four times a day (QID) | INTRAMUSCULAR | Status: DC
Start: 2015-12-16 — End: 2015-12-18
  Administered 2015-12-16 – 2015-12-17 (×2): 2 mg via INTRAVENOUS
  Filled 2015-12-16 (×2): qty 1

## 2015-12-16 MED ORDER — GLYBURIDE 5 MG PO TABS
10.0000 mg | ORAL_TABLET | Freq: Two times a day (BID) | ORAL | Status: DC
  Administered 2015-12-17 – 2015-12-19 (×5): 10 mg via ORAL
  Filled 2015-12-16 (×9): qty 2

## 2015-12-16 MED ORDER — IPRATROPIUM-ALBUTEROL 0.5-2.5 (3) MG/3ML IN SOLN
3.0000 mL | Freq: Four times a day (QID) | RESPIRATORY_TRACT | Status: DC
  Administered 2015-12-16 – 2015-12-18 (×5): 3 mL via RESPIRATORY_TRACT
  Filled 2015-12-16 (×8): qty 3

## 2015-12-16 MED ORDER — ENOXAPARIN SODIUM 40 MG/0.4ML ~~LOC~~ SOLN
40.0000 mg | SUBCUTANEOUS | Status: DC
  Administered 2015-12-16 – 2015-12-19 (×4): 40 mg via SUBCUTANEOUS
  Filled 2015-12-16 (×4): qty 0.4

## 2015-12-16 MED ORDER — VANCOMYCIN HCL IN DEXTROSE 750-5 MG/150ML-% IV SOLN
750.0000 mg | Freq: Two times a day (BID) | INTRAVENOUS | Status: DC
  Filled 2015-12-16 (×3): qty 150

## 2015-12-16 MED ORDER — FERROUS SULFATE 325 (65 FE) MG PO TABS
325.0000 mg | ORAL_TABLET | Freq: Every day | ORAL | Status: DC
  Administered 2015-12-18 – 2015-12-24 (×7): 325 mg via ORAL
  Filled 2015-12-16 (×8): qty 1

## 2015-12-16 MED ORDER — LORAZEPAM 2 MG/ML IJ SOLN: 0.0000 mg | Freq: Two times a day (BID) | INTRAMUSCULAR | Status: DC

## 2015-12-16 MED ORDER — FOLIC ACID 1 MG PO TABS
1.0000 mg | ORAL_TABLET | Freq: Every day | ORAL | Status: DC
  Administered 2015-12-17 – 2015-12-24 (×8): 1 mg via ORAL
  Filled 2015-12-16 (×8): qty 1

## 2015-12-16 MED ORDER — PIPERACILLIN-TAZOBACTAM 3.375 G IVPB
3.3750 g | Freq: Three times a day (TID) | INTRAVENOUS | Status: DC
  Filled 2015-12-16: qty 50

## 2015-12-16 MED ORDER — PIPERACILLIN-TAZOBACTAM 3.375 G IVPB 30 MIN
3.3750 g | Freq: Once | INTRAVENOUS | Status: DC
  Filled 2015-12-16: qty 50

## 2015-12-16 MED ORDER — SODIUM CHLORIDE 0.9 % IV SOLN
INTRAVENOUS | Status: DC
  Administered 2015-12-17: via INTRAVENOUS

## 2015-12-16 MED ORDER — VITAMIN B-1 100 MG PO TABS
100.0000 mg | ORAL_TABLET | Freq: Every day | ORAL | Status: DC
  Administered 2015-12-17 – 2015-12-24 (×8): 100 mg via ORAL
  Filled 2015-12-16 (×8): qty 1

## 2015-12-16 MED ORDER — INSULIN ASPART 100 UNIT/ML ~~LOC~~ SOLN
6.0000 [IU] | Freq: Once | SUBCUTANEOUS | Status: AC
  Administered 2015-12-16: 6 [IU] via SUBCUTANEOUS

## 2015-12-16 MED ORDER — GUAIFENESIN ER 600 MG PO TB12
600.0000 mg | ORAL_TABLET | Freq: Two times a day (BID) | ORAL | Status: DC
  Administered 2015-12-16 – 2015-12-24 (×16): 600 mg via ORAL
  Filled 2015-12-16 (×16): qty 1

## 2015-12-16 MED ORDER — ADULT MULTIVITAMIN W/MINERALS CH
1.0000 | ORAL_TABLET | Freq: Every day | ORAL | Status: DC
  Administered 2015-12-17 – 2015-12-24 (×8): 1 via ORAL
  Filled 2015-12-16 (×8): qty 1

## 2015-12-16 MED ORDER — LORAZEPAM 2 MG/ML IJ SOLN: 1.0000 mg | Freq: Four times a day (QID) | INTRAMUSCULAR | Status: AC | PRN

## 2015-12-16 NOTE — ED Notes (Signed)
MD at bedside. 

## 2015-12-16 NOTE — Progress Notes (Signed)
ANTIBIOTIC CONSULT NOTE - INITIAL  Pharmacy Consult for vancomycin, zosyn Indication: rule out pneumonia  Allergies  Allergen Reactions  . Latex Rash   Vital Signs: Temp: 97.7 F (36.5 C) (01/07 1611) Temp Source: Oral (01/07 1611) BP: 177/97 mmHg (01/07 1645) Pulse Rate: 94 (01/07 1645) Intake/Output from previous day:   Intake/Output from this shift:    Labs:  Recent Labs  12/16/15 1622 12/16/15 1628  WBC 10.0  --   HGB 8.4* 9.9*  PLT 313  --   CREATININE  --  1.70*   Estimated Creatinine Clearance: 48 mL/min (by C-G formula based on Cr of 1.7). No results for input(s): VANCOTROUGH, VANCOPEAK, VANCORANDOM, GENTTROUGH, GENTPEAK, GENTRANDOM, TOBRATROUGH, TOBRAPEAK, TOBRARND, AMIKACINPEAK, AMIKACINTROU, AMIKACIN in the last 72 hours.   Assessment: 65 yo male with complaints of HTN, Hyperglycemia, Emesis, and Urinated on himself twice. Recent admission for HCAP (1/2 - 1/4)  PMH: CKD3, HTN, DM2, Hx of DVT  ID: abx for HCAP. WBC 10, AF  Zosyn x 1 1/7 Vancomycin 1/7>>  Renal: SCr 1.70  Goal of Therapy:  Vancomycin trough level 15-20 mcg/ml  Plan:  Zosyn 3.375 gm iv q8h, vancomycin 750 mg IV q12h Monitor clinical status, culture results, renal fx VT prn  Isaac BlissMichael Milika Ventress, PharmD, BCPS, [facility] Clinical Pharmacist Pager [phone] 12/16/2015 5:04 PM

## 2015-12-16 NOTE — ED Notes (Signed)
Pt raises his voice at this RN several times. Pt informed that this was inappropriate and that there is no need to speak that way. Pt sts that he understands and that he is "just bad off."

## 2015-12-16 NOTE — ED Notes (Signed)
Attempted report 

## 2015-12-16 NOTE — ED Provider Notes (Signed)
CSN: [id number]     Arrival date & time 12/16/15  1602 History   First MD Initiated Contact with Patient 12/16/15 1607     Chief Complaint  Patient presents with  . Hypertension  . Hyperglycemia    Level 5 caveat due to uncooperativeness.  Patient is a 65 y.o. male presenting with hypertension and hyperglycemia. The history is provided by the patient.  Hypertension  Hyperglycemia  patient is brought in by EMS for doing bad. Discharge from the hospital 2 days ago for HCAP and medication noncompliance. States she's been doing bad since then. States his been coughing. Every question that I asked him in the review of systems he said yes to. He has 21 visits to the ER the last 6 months in this difficult history appears to be his baseline. Reportedly had oxygen saturations for EMS of 60% but refused a nonrebreather because he did not like the way it was on his face. Upon arrival to ER he states he just wants some ice water.   Past Medical History  Diagnosis Date  . CKD (chronic kidney disease), stage III   . Anemia   . HTN (hypertension)   . Type II diabetes mellitus (HCC)   . DVT (deep venous thrombosis) (HCC)     LLE  . Pneumonia 12/06/2015  . GERD (gastroesophageal reflux disease)   . Migraine     "a few times/month" (12/06/2015)  . Depression    Past Surgical History  Procedure Laterality Date  . No past surgeries     Family History  Problem Relation Age of Onset  . Diabetes Mellitus II Mother   . Diabetes Mellitus II Father   . Stroke Maternal Grandfather    Social History  Substance Use Topics  . Smoking status: Current Every Day Smoker -- 0.25 packs/day for 48 years    Types: Cigarettes  . Smokeless tobacco: Never Used  . Alcohol Use: Yes    Review of Systems  Unable to perform ROS: Other      Allergies  Latex  Home Medications   Prior to Admission medications   Medication Sig Start Date End Date Taking? Authorizing Provider  diltiazem (CARDIZEM SR) 120 MG  12 hr capsule Take 1 capsule (120 mg total) by mouth every 12 (twelve) hours. 12/05/15  Yes Alison Murray, MD  glyBURIDE (DIABETA) 5 MG tablet Take 2 tablets (10 mg total) by mouth 2 (two) times daily with a meal. 12/05/15  Yes Alison Murray, MD  hydrALAZINE (APRESOLINE) 25 MG tablet Take 1 tablet (25 mg total) by mouth every 6 (six) hours. 12/05/15  Yes Alison Murray, MD  ferrous sulfate 325 (65 FE) MG tablet Take 1 tablet (325 mg total) by mouth daily with breakfast. Patient not taking: Reported on 12/17/2015 12/13/15   Rolly Salter, MD  levofloxacin (LEVAQUIN) 750 MG tablet Take 1 tablet (750 mg total) by mouth every other day. Patient not taking: Reported on 12/17/2015 12/13/15 12/17/15  Rolly Salter, MD  metFORMIN (GLUCOPHAGE) 1000 MG tablet Take 1 tablet (1,000 mg total) by mouth 2 (two) times daily. Patient not taking: Reported on 12/17/2015 12/11/15   Melene Plan, DO   BP 140/68 mmHg  Pulse 88  Temp(Src) 98.1 F (36.7 C) (Oral)  Resp 21  SpO2 90% Physical Exam  Constitutional: He appears well-developed.  Patient is foul-smelling, of urine and some other smells also.  HENT:  Head: Normocephalic.  Eyes: Pupils are equal, round, and reactive to light.  Neck: Neck supple.  Cardiovascular:  Mild tachycardia  Pulmonary/Chest:  Diffuse harsh breath sounds with some wheezes and rhonchi.  Abdominal: There is no tenderness.  Musculoskeletal: He exhibits edema.  Edema to bilateral lower legs.  Skin: Skin is warm.    ED Course  Procedures (including critical care time) Labs Review Labs Reviewed  CBC WITH DIFFERENTIAL/PLATELET - Abnormal; Notable for the following:    RBC 2.92 (*)    Hemoglobin 8.4 (*)    HCT 26.4 (*)    Neutro Abs 8.8 (*)    Lymphs Abs 0.5 (*)    All other components within normal limits  COMPREHENSIVE METABOLIC PANEL - Abnormal; Notable for the following:    CO2 21 (*)    Glucose, Bld 615 (*)    BUN 35 (*)    Creatinine, Ser 1.93 (*)    Calcium 8.6 (*)    Total  Protein 5.6 (*)    Albumin 2.0 (*)    Total Bilirubin 0.2 (*)    GFR calc non Af Amer 36 (*)    GFR calc Af Amer 41 (*)    All other components within normal limits  URINALYSIS, ROUTINE W REFLEX MICROSCOPIC ([facility]) - Abnormal; Notable for the following:    Specific Gravity, Urine 1.031 (*)    Glucose, UA >1000 (*)    Hgb urine dipstick MODERATE (*)    Protein, ur >300 (*)    All other components within normal limits  BRAIN NATRIURETIC PEPTIDE - Abnormal; Notable for the following:    B Natriuretic Peptide 1348.3 (*)    All other components within normal limits  TROPONIN I - Abnormal; Notable for the following:    Troponin I 0.07 (*)    All other components within normal limits  URINE MICROSCOPIC-ADD ON - Abnormal; Notable for the following:    Squamous Epithelial / LPF 0-5 (*)    Bacteria, UA RARE (*)    Casts HYALINE CASTS (*)    All other components within normal limits  PHOSPHORUS - Abnormal; Notable for the following:    Phosphorus 5.0 (*)    All other components within normal limits  COMPREHENSIVE METABOLIC PANEL - Abnormal; Notable for the following:    Glucose, Bld 272 (*)    BUN 37 (*)    Creatinine, Ser 2.07 (*)    Calcium 8.3 (*)    Total Protein 5.4 (*)    Albumin 1.6 (*)    GFR calc non Af Amer 33 (*)    GFR calc Af Amer 38 (*)    All other components within normal limits  CBC WITH DIFFERENTIAL/PLATELET - Abnormal; Notable for the following:    RBC 2.57 (*)    Hemoglobin 7.4 (*)    HCT 22.8 (*)    All other components within normal limits  GLUCOSE, CAPILLARY - Abnormal; Notable for the following:    Glucose-Capillary 419 (*)    All other components within normal limits  GLUCOSE, CAPILLARY - Abnormal; Notable for the following:    Glucose-Capillary 422 (*)    All other components within normal limits  GLUCOSE, CAPILLARY - Abnormal; Notable for the following:    Glucose-Capillary 301 (*)    All other components within normal limits  I-STAT CHEM 8, ED  - Abnormal; Notable for the following:    BUN 35 (*)    Creatinine, Ser 1.70 (*)    Glucose, Bld 610 (*)    Calcium, Ion 1.12 (*)    Hemoglobin 9.9 (*)  HCT 29.0 (*)    All other components within normal limits  CBG MONITORING, ED - Abnormal; Notable for the following:    Glucose-Capillary 537 (*)    All other components within normal limits  CULTURE, BLOOD (ROUTINE X 2)  CULTURE, BLOOD (ROUTINE X 2)  CULTURE, EXPECTORATED SPUTUM-ASSESSMENT  GRAM STAIN  ETHANOL  INFLUENZA PANEL BY PCR (TYPE A & B, H1N1)  URINE RAPID DRUG SCREEN, HOSP PERFORMED  MAGNESIUM  STREP PNEUMONIAE URINARY ANTIGEN  HIV ANTIBODY (ROUTINE TESTING)  CBC  BASIC METABOLIC PANEL  I-STAT CG4 LACTIC ACID, ED    Imaging Review Dg Chest Portable 1 View  12/16/2015  CLINICAL DATA:  Hypertension.  Hyperglycemia. EXAM: PORTABLE CHEST 1 VIEW COMPARISON:  12/13/2015 FINDINGS: Heart size is mildly enlarged. The lung volumes are low. There are diffuse bilateral lung opacities which may reflect multifocal infection or diffuse pulmonary edema. When compared with previous exam the aeration to lungs have worsened in the interval. IMPRESSION: 1. Worsening bilateral lung opacities compatible with multifocal infection and/or edema. Electronically Signed   By: Signa Kell M.D.   On: 12/16/2015 16:28   I have personally reviewed and evaluated these images and lab results as part of my medical decision-making.   EKG Interpretation   Date/Time:  Saturday December 16 2015 17:11:16 EST Ventricular Rate:  96 PR Interval:  136 QRS Duration: 143 QT Interval:  409 QTC Calculation: 517 R Axis:   103 Text Interpretation:  Sinus rhythm Probable left atrial enlargement Right  bundle branch block Reconfirmed by Thelton Graca  MD, Harrold Donath [phone]) on  12/17/2015 11:06:40 PM      MDM   Final diagnoses:  HCAP (healthcare-associated pneumonia)  Hyperglycemia    Patient presented with shortness of breath. Also found to be hyperglycemic.  Also hypertension. Recent admission for healthcare associated pneumonia. Initial EMS sats were in the 80s. Had refused oxygen. Chest x-ray worse. Has a history of leaving AMA but states she'll be admitted at this time.    Benjiman Core, MD 12/17/15 2306

## 2015-12-16 NOTE — ED Notes (Signed)
IV team at bedside 

## 2015-12-16 NOTE — ED Notes (Signed)
Pt requested ice water pt given the same

## 2015-12-16 NOTE — ED Notes (Signed)
Pt here via EMS with c/o htn, hyperglycemia. Pt c/o abdominal pain with 2 reported episodes of emesis. Pt smells of urine upon arrival and sts that he urinated on himself twice. EMS CBG reads high, BP 210/120

## 2015-12-16 NOTE — H&P (Signed)
[facility] History and Physical                                                                                    Clayton [name], is a 65 y.o. male  MRN: [id number]   DOB - Aug 09, 1951  Admit Date - 12/16/2015  Outpatient Primary MD for the patient is PROVIDER NOT IN SYSTEM  Referring MD: Rubin Payor / ER  PMH: Past Medical History  Diagnosis Date  . CKD (chronic kidney disease), stage III   . Anemia   . HTN (hypertension)   . Type II diabetes mellitus (HCC)   . DVT (deep venous thrombosis) (HCC)     LLE  . Pneumonia 12/06/2015  . GERD (gastroesophageal reflux disease)   . Migraine     "a few times/month" (12/06/2015)  . Depression       PSH: Past Surgical History  Procedure Laterality Date  . No past surgeries       CC:  Chief Complaint  Patient presents with  . Hypertension  . Hyperglycemia     HPI: This is a 65 year old male patient well known to the hospitalist service. 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 Patient has noncompliance with medications and therapies and it is suspected he has underlying psychiatric illness for which he is not being actively treated. In review of outpatient records there is a suggestion of possible bipolar disorder. Patient denies actually being homeless and reports he lives in a boarding house but does not get along with some of the caretakers at that facility. He has been either in the ER and or admitted at least 21 times in the past 6 months. He typically presents with hyperglycemia and uncontrolled hypertension. He is usually very hungry. Once he is eaten and his blood pressure and sugar becomes controlled he then becomes restless, wants to smoke and eventually leaves AMA. His urine drug screens are typically positive for cocaine. He was recently admitted on 1/2 for HCAP and was discharged on 1/4 with a prescription of Levaquin. Unclear if  patient got this medication filled nor to continue this medication. Workup during that hospitalization revealed HIV negative status, quantity or on gold indeterminate, influenza PCR negative, MRSA negative, blood cultures without growth. It appears patient was discharged because he continued to refuse treatments and blood work. He returns back to the ER today because of increasing cough congestion and malaise. Apparently when EMS came to pick the patient up his O2 sat duration was 60% but refused a nonrebreather mask because he did not like the way it was situated on his face. Upon arrival to the ER his only request was for ice water.  ER Evaluation and treatment: Afebrile, moderately hypertensive with BP 177/97, pulse 94, room air saturations 89% Portable chest x-ray: Shows worsening bilateral lung opacities compatible with multifocal infection and/or edema. In review of previous chest x-rays he's had evidence of some sort of infectious or edematous-type process for about a month of December with October 31, 2015 x-ray unremarkable. EKG: Sinus rhythm, ventricular rate 96 bpm, QTC 517 ms, right bundle branch block, no ischemic  changes. BUN 35 and creatinine 1.7 Serum glucose 610 Calcium 1.12 Lactic acid 1.96 Hemoglobin 8.4 Albumin 2.0 No leukocytosis although neutrophils are elevated at 88% and absolute neutrophils are 8.8% Urinalysis shows greater than 1000 glucose moderate hemoglobin and greater than 300 protein urine specific gravity of 1.031 Zosyn and vancomycin IV ordered by EDP  Review of Systems   In addition to the HPI above,  No Fever-chills, myalgias or other constitutional symptoms No Headache, changes with Vision or hearing, new weakness, tingling, numbness in any extremity, No problems swallowing food or Liquids, indigestion/reflux No Chest pain, palpitations, orthopnea  No Abdominal pain, N/V; no melena or hematochezia, no dark tarry stools, Bowel movements are regular, No  dysuria, hematuria or flank pain No new skin rashes, lesions, masses or bruises, No new joints pains-aches No recent weight gain or loss No polyuria, polydypsia or polyphagia,  *A full 10 point Review of Systems was done, except as stated above, all other Review of Systems were negative.  Social History Social History  Substance Use Topics  . Smoking status: Current Every Day Smoker -- 0.25 packs/day for 48 years    Types: Cigarettes  . Smokeless tobacco: Never Used  . Alcohol Use: Yes    Resides at: Private residence reportedly a boarding house although highly suspicious patient is homeless   Lives with: As above  Ambulatory status: Without assistive devices   Family History Family History  Problem Relation Age of Onset  . Diabetes Mellitus II Mother   . Diabetes Mellitus II Father   . Stroke Maternal Grandfather      Prior to Admission medications   Medication Sig Start Date End Date Taking? Authorizing Provider  diltiazem (CARDIZEM SR) 120 MG 12 hr capsule Take 1 capsule (120 mg total) by mouth every 12 (twelve) hours. 12/05/15   Alison Murray, MD  ferrous sulfate 325 (65 FE) MG tablet Take 1 tablet (325 mg total) by mouth daily with breakfast. 12/13/15   Rolly Salter, MD  glyBURIDE (DIABETA) 5 MG tablet Take 2 tablets (10 mg total) by mouth 2 (two) times daily with a meal. 12/05/15   Alison Murray, MD  hydrALAZINE (APRESOLINE) 25 MG tablet Take 1 tablet (25 mg total) by mouth every 6 (six) hours. 12/05/15   Alison Murray, MD  levofloxacin (LEVAQUIN) 750 MG tablet Take 1 tablet (750 mg total) by mouth every other day. 12/13/15 12/17/15  Rolly Salter, MD  metFORMIN (GLUCOPHAGE) 1000 MG tablet Take 1 tablet (1,000 mg total) by mouth 2 (two) times daily. 12/11/15   Melene Plan, DO    Allergies  Allergen Reactions  . Latex Rash    Physical Exam  Vitals  Blood pressure 177/97, pulse 94, temperature 97.7 F (36.5 C), temperature source Oral, resp. rate 18, SpO2 89  %.   General:  In moderate acute distress as evidenced by shortness of breath and malaise, appears unkempt and malnourished  Psych: Flat affect, Denies Suicidal or Homicidal ideations, Awake Alert, Oriented X 3. Speech and thought patterns are abnormal noting patient does not have appropriate insight into his living conditions and his culpability, he also has a degree of pressured speech  Neuro:   No focal neurological deficits, CN II through XII intact, Strength 5/5 all 4 extremities, Sensation intact all 4 extremities.  ENT:  Ears and Eyes appear Normal, Conjunctivae clear, PER. Dry oral mucosa without erythema or exudates.  Neck:  Supple, No lymphadenopathy appreciated  Respiratory:  Symmetrical chest wall movement,  Good air movement bilaterally, coarse expiratory rhonchi. Room Air-thick yellow sputum has been coughed up and is noted on patient's beard and gown  Cardiac:  RRR, No Murmurs, no LE edema noted, no JVD, No carotid bruits, peripheral pulses palpable at 2+  Abdomen:  Positive bowel sounds, Soft, Non tender, Non distended,  No masses appreciated, no obvious hepatosplenomegaly  Skin:  No Cyanosis, Normal Skin Turgor, No Skin Rash or Bruise.  Extremities: Symmetrical without obvious trauma or injury,  no effusions.  Data Review  CBC  Recent Labs Lab 12/10/15 2220 12/16/15 1622 12/16/15 1628  WBC 6.3 10.0  --   HGB 9.3* 8.4* 9.9*  HCT 28.5* 26.4* 29.0*  PLT 224 313  --   MCV 88.5 90.4  --   MCH 28.9 28.8  --   MCHC 32.6 31.8  --   RDW 14.4 14.3  --   LYMPHSABS  --  0.5*  --   MONOABS  --  0.7  --   EOSABS  --  0.0  --   BASOSABS  --  0.0  --     Chemistries   Recent Labs Lab 12/10/15 2220 12/16/15 1622 12/16/15 1628  NA 134* 135 136  K 5.1 4.4 4.3  CL 102 102 102  CO2 24 21*  --   GLUCOSE 498* 615* 610*  BUN 34* 35* 35*  CREATININE 1.64* 1.93* 1.70*  CALCIUM 8.4* 8.6*  --   AST 35 20  --   ALT 23 19  --   ALKPHOS 109 99  --   BILITOT 0.6 0.2*   --     estimated creatinine clearance is 48 mL/min (by C-G formula based on Cr of 1.7).  No results for input(s): TSH, T4TOTAL, T3FREE, THYROIDAB in the last 72 hours.  Invalid input(s): FREET3  Coagulation profile No results for input(s): INR, PROTIME in the last 168 hours.  No results for input(s): DDIMER in the last 72 hours.  Cardiac Enzymes No results for input(s): CKMB, TROPONINI, MYOGLOBIN in the last 168 hours.  Invalid input(s): CK  Invalid input(s): POCBNP  Urinalysis    Component Value Date/Time   COLORURINE YELLOW 12/16/2015 1640   APPEARANCEUR CLEAR 12/16/2015 1640   LABSPEC 1.031* 12/16/2015 1640   PHURINE 5.5 12/16/2015 1640   GLUCOSEU >1000* 12/16/2015 1640   HGBUR MODERATE* 12/16/2015 1640   BILIRUBINUR NEGATIVE 12/16/2015 1640   KETONESUR NEGATIVE 12/16/2015 1640   PROTEINUR >300* 12/16/2015 1640   UROBILINOGEN 0.2 10/08/2015 0634   NITRITE NEGATIVE 12/16/2015 1640   LEUKOCYTESUR NEGATIVE 12/16/2015 1640    Imaging results:   Dg Chest 2 View  12/11/2015  CLINICAL DATA:  65 year old male with shortness of breath and cough for 3 weeks. EXAM: CHEST  2 VIEW COMPARISON:  12/06/2015 and prior exams FINDINGS: Mild cardiomegaly again noted. Very small bilateral pleural effusions have increased. Diffuse peribronchial thickening is again noted. New patchy right upper lobe opacities and unchanged patchy lower lobe opacities are noted and suspicious for bronchopneumonia. There is no evidence of pneumothorax. IMPRESSION: New patchy right upper lobe opacities and unchanged patchy lower lobe opacity suspicious for bronchopneumonia. Slightly increasing very small bilateral pleural effusions. Mild cardiomegaly. Electronically Signed   By: Harmon Pier M.D.   On: 12/11/2015 07:59   Dg Chest 2 View  12/06/2015  CLINICAL DATA:  Cough. EXAM: CHEST  2 VIEW COMPARISON:  12/04/2015 and 10/31/2015 FINDINGS: There is prominent peribronchial thickening. On the lateral view there is  a small patchy infiltrate posteriorly  and inferiorly. Heart size and pulmonary vascularity are normal. No osseous abnormality. IMPRESSION: Small patchy infiltrate at 1 of the lung bases posteriorly and inferiorly. Prominent bronchitic changes. Electronically Signed   By: Francene Boyers M.D.   On: 12/06/2015 14:39   Dg Chest 2 View  12/04/2015  CLINICAL DATA:  Acute onset of hyperglycemia. Dizziness, cough and congestion. Vomiting and diarrhea. Initial encounter. EXAM: CHEST  2 VIEW COMPARISON:  Chest radiograph performed 10/31/2015 FINDINGS: The lungs are well-aerated. Mild vascular congestion is noted. Peribronchial thickening is noted. Minimal bilateral opacities may reflect atelectasis or mild pneumonia, depending on the patient's symptoms. There is no evidence of pleural effusion or pneumothorax. The heart is mildly enlarged. No acute osseous abnormalities are seen. IMPRESSION: Mild vascular congestion and mild cardiomegaly. Peribronchial thickening noted. Minimal bilateral opacities may reflect atelectasis or possibly mild pneumonia, depending on the patient's symptoms. Electronically Signed   By: Roanna Raider M.D.   On: 12/04/2015 03:32   Dg Chest Portable 1 View  12/16/2015  CLINICAL DATA:  Hypertension.  Hyperglycemia. EXAM: PORTABLE CHEST 1 VIEW COMPARISON:  12/13/2015 FINDINGS: Heart size is mildly enlarged. The lung volumes are low. There are diffuse bilateral lung opacities which may reflect multifocal infection or diffuse pulmonary edema. When compared with previous exam the aeration to lungs have worsened in the interval. IMPRESSION: 1. Worsening bilateral lung opacities compatible with multifocal infection and/or edema. Electronically Signed   By: Signa Kell M.D.   On: 12/16/2015 16:28   Dg Chest Port 1 View  12/13/2015  CLINICAL DATA:  Healthcare associated pneumonia, diabetes, chronic CHF, chronic renal insufficiency, substance abuse, current smoker. EXAM: PORTABLE CHEST 1 VIEW  COMPARISON:  PA and lateral chest x-ray of December 11, 2015 FINDINGS: The lungs are slightly less well inflated. There remain confluent interstitial and alveolar opacities bilaterally. These are more conspicuous today. The heart is top-normal in size. The pulmonary vascularity is prominent centrally but no definite cephalization of the vascular pattern is observed. The bony thorax exhibits no acute abnormality. IMPRESSION: Bilateral interstitial and alveolar pneumonia with chronic low-grade compensated CHF. The appearance of the chest has deteriorated somewhat since yesterday's study which may be in part due to the portable technique versus the PA and lateral technique. Electronically Signed   By: David  [place] M.D.   On: 12/13/2015 08:01     EKG: (Independently reviewed)  Sinus rhythm, ventricular rate 96 bpm, QTC 517 ms, right bundle branch block, no ischemic changes.   Assessment & Plan  Principal Problem:   HCAP / Acute respiratory failure with hypoxia -Inpatient/telemetry -Institute HCAP order set to include broad-spectrum antibiotics, sputum culture and additional laboratory evaluation including blood cultures -Influenza PCR -Mucinex -DuoNeb -TCDB and flutter valve -Encourage patient to utilize oxygen-currently refusing -Unclear if was taking recently prescribed Levaquin at home  Active Problems:   Acute renal failure superimposed on stage 3 chronic kidney disease  -Gentle IV fluid hydration since does have underlying diastolic heart failure issues -Has proteinuria and suspect has underlying diabetic nephropathy and at increased risk for eventual aggression to need for dialysis    Type 2 diabetes mellitus with hyperosmolar nonketotic hyperglycemia  -In the past patient has refused insulin although reportedly was agreeable to [facility] but apparently has not been taking -Anion gap is 12 -Continue glyburide -Check CBGs and ride SSI -Hemoglobin A1c 12/26 was 14.7    Noncompliance with  medications/therapies -Patient repeatedly becomes frustrated with the structure of hospitalized care and management and leaves AMA -Currently he  is requesting to be placed in a skilled nursing facility because where he is living at now "doesn't have a way to manage me medically and they don't understand my medical problems"-he also reports significant malaise and feeling poorly and feels like he cannot return to the boarding house -Had a lengthy discussion with patient that he would need to remain hospitalized until his pneumonia has improved and this may take up to 7-10 days and he agreed at time of admission -PT/OT evaluation to determine if appropriate for skilled nursing facility -Social work has been consulted as well    Essential hypertension -Blood pressure uncontrolled -Resume diltiazem and hydralazine    Chronic diastolic CHF  -Appears compensated    Anemia of chronic disease -Hemoglobin stable and at baseline    Alcohol abuse/Cocaine abuse/Homeless -As above regarding patient request for skilled nursing facility placement -Check urine drug screen -Begin CIWA    Prolonged Q-T interval on ECG -New finding and has recently been on Levaquin -Ck magnesium phosphorus    DVT Prophylaxis: Lovenox  Family Communication:  No family at bedside   Code Status:  Full code   Condition:  Stable  Discharge disposition: if patient does not leave AMA hopeful he will meet criteria to be admitted to skilled nursing facility; if not may need assistance of social work to to assist in determining if current boarding house is appropriate for patient to return to  Time spent in minutes : 60      ELLIS,ALLISON L. ANP on 12/16/2015 at 5:19 PM  You may contact me by going to [email or web address] - password [email or web address]  I am available from 7a-7p but please confirm I am on the schedule by going to Amion as above.   After 7p please contact night coverage person covering me after hours  [facility]  I have taken an interval history, reviewed the chart and examined the patient. I agree with the Advanced Practice Provider's note, impression and recommendations. I have made any necessary editorial changes. 65 year old with a history of diabetes mellitus, hypertension, C KD came to the hospital with increasing cough, congestion and malaise. Chest x-ray showed bilateral lung opacities compatible with multifocal infection and/or edema. Will start the patient on vancomycin and Zosyn per pharmacy consultation. Follow blood cultures  Patient has a history of alcohol abuse in the past and started on CIWA protocol.  As QTC prolongation on EKG with interval 517. Will check serum magnesium level.

## 2015-12-16 NOTE — ED Notes (Signed)
Attempted IV access x2.  ?

## 2015-12-16 NOTE — Progress Notes (Signed)
Received report from Melissa.

## 2015-12-16 NOTE — ED Notes (Signed)
PT REQUESTED DIET SPRITE, PT GIVEN THE SAME

## 2015-12-16 NOTE — ED Notes (Signed)
PT CBG [id number], RN MELISSA INFORMED

## 2015-12-16 NOTE — Progress Notes (Signed)
Clayton [name] [id number] Admission Data: 12/16/2015 6:34 PM Attending Provider: Meredeth IdeGagan S Lama, MD  WUJ:[id number]:PROVIDER NOT IN SYSTEM Consults/ Treatment Team:    Clayton [name] is a 65 y.o. male patient admitted from ED awake, alert  & orientated  X 3,  Full Code, VSS - Blood pressure 175/93, pulse 96, temperature 97.7 F (36.5 C), temperature source Oral, resp. rate 18, SpO2 92 %., O2     no c/o chest pain, no distress noted. Tele # 13 placed and pt is currently running:normal sinus rhythm.    Allergies:   Allergies  Allergen Reactions  . Latex Rash     Past Medical History  Diagnosis Date  . CKD (chronic kidney disease), stage III   . Anemia   . HTN (hypertension)   . Type II diabetes mellitus (HCC)   . DVT (deep venous thrombosis) (HCC)     LLE  . Pneumonia 12/06/2015  . GERD (gastroesophageal reflux disease)   . Migraine     "a few times/month" (12/06/2015)  . Depression     History:  obtained from the patient. Tobacco/alcohol: Smoked 1 packs per day for 20 years social drinker  Pt orientation to unit, room and routine. Information packet given to patient/family and safety video watched.  Admission INP armband ID verified with patient/family, and in place. SR up x 2, fall risk assessment complete with Patient and family verbalizing understanding of risks associated with falls. Pt verbalizes an understanding of how to use the call bell and to call for help before getting out of bed.  Skin has some scabs on BLE     Will cont to monitor and assist as needed.  Shlonda Dolloff Consuella Loselaine, RN 12/16/2015 6:34 PM

## 2015-12-16 NOTE — Progress Notes (Addendum)
Disregard cpap entry. Incorrect pt. Information.

## 2015-12-17 ENCOUNTER — Encounter (HOSPITAL_COMMUNITY): Payer: Self-pay

## 2015-12-17 DIAGNOSIS — Z9114 Patient's other noncompliance with medication regimen: Secondary | ICD-10-CM

## 2015-12-17 LAB — COMPREHENSIVE METABOLIC PANEL
ALBUMIN: 1.6 g/dL — AB (ref 3.5–5.0)
ALK PHOS: 79 U/L (ref 38–126)
ALT: 18 U/L (ref 17–63)
AST: 21 U/L (ref 15–41)
Anion gap: 9 (ref 5–15)
BUN: 37 mg/dL — AB (ref 6–20)
CALCIUM: 8.3 mg/dL — AB (ref 8.9–10.3)
CHLORIDE: 105 mmol/L (ref 101–111)
CO2: 23 mmol/L (ref 22–32)
CREATININE: 2.07 mg/dL — AB (ref 0.61–1.24)
GFR calc non Af Amer: 33 mL/min — ABNORMAL LOW (ref >60)
GFR, EST AFRICAN AMERICAN: 38 mL/min — AB (ref >60)
GLUCOSE: 272 mg/dL — AB (ref 65–99)
Potassium: 3.9 mmol/L (ref 3.5–5.1)
SODIUM: 137 mmol/L (ref 135–145)
Total Bilirubin: 0.5 mg/dL (ref 0.3–1.2)
Total Protein: 5.4 g/dL — ABNORMAL LOW (ref 6.5–8.1)

## 2015-12-17 LAB — CBC WITH DIFFERENTIAL/PLATELET
BASOS PCT: 0 %
Basophils Absolute: 0 10*3/uL (ref 0.0–0.1)
EOS ABS: 0.2 10*3/uL (ref 0.0–0.7)
EOS PCT: 2 %
HCT: 22.8 % — ABNORMAL LOW (ref 39.0–52.0)
HEMOGLOBIN: 7.4 g/dL — AB (ref 13.0–17.0)
LYMPHS ABS: 1 10*3/uL (ref 0.7–4.0)
Lymphocytes Relative: 11 %
MCH: 28.8 pg (ref 26.0–34.0)
MCHC: 32.5 g/dL (ref 30.0–36.0)
MCV: 88.7 fL (ref 78.0–100.0)
MONO ABS: 0.7 10*3/uL (ref 0.1–1.0)
MONOS PCT: 7 %
NEUTROS PCT: 81 %
Neutro Abs: 7.6 10*3/uL (ref 1.7–7.7)
Platelets: 286 10*3/uL (ref 150–400)
RBC: 2.57 MIL/uL — ABNORMAL LOW (ref 4.22–5.81)
RDW: 14 % (ref 11.5–15.5)
WBC: 9.4 10*3/uL (ref 4.0–10.5)

## 2015-12-17 LAB — GLUCOSE, CAPILLARY
GLUCOSE-CAPILLARY: 422 mg/dL — AB (ref 65–99)
Glucose-Capillary: 301 mg/dL — ABNORMAL HIGH (ref 65–99)

## 2015-12-17 LAB — INFLUENZA PANEL BY PCR (TYPE A & B)
H1N1FLUPCR: NOT DETECTED
INFLAPCR: NEGATIVE
INFLBPCR: NEGATIVE

## 2015-12-17 MED ORDER — FUROSEMIDE 40 MG PO TABS
40.0000 mg | ORAL_TABLET | Freq: Two times a day (BID) | ORAL | Status: DC
  Administered 2015-12-17 – 2015-12-18 (×3): 40 mg via ORAL
  Filled 2015-12-17 (×3): qty 1

## 2015-12-17 MED ORDER — LEVOFLOXACIN 500 MG PO TABS
500.0000 mg | ORAL_TABLET | Freq: Every day | ORAL | Status: DC
  Administered 2015-12-17 – 2015-12-19 (×3): 500 mg via ORAL
  Filled 2015-12-17 (×3): qty 1

## 2015-12-17 NOTE — Progress Notes (Signed)
12/17/15 Patient refused all treatments and medications, refused to have a PICC inserted or have them assessment for placement, refusing to wear oxygen, telemetry, and finger sticks.

## 2015-12-17 NOTE — Evaluation (Signed)
Physical Therapy Evaluation Patient Details Name: Clayton [name] MRN: [id number] DOB: 14-Nov-1951 Today's Date: 12/17/2015   History of Present Illness  65 y.o. male with Hx of CKD, anemia, HTN, DM II, DVT, and depression. Admitted with principle problems of HCAP / Acute respiratory failure with hypoxia,Type 2 diabetes mellitus with hyperosmolar nonketotic hyperglycemia , chronic diastolic CHF, and  Noncompliance with medications/therapies  Clinical Impression  Pt admitted with the above complications. Pt currently with functional limitations due to the deficits listed below (see PT Problem List). Demonstrates significant instability with gait requiring assistance for balance and a rolling walker for support. Refused supplemental O2 during short distance ambulation. Once returned to bed, had a long discussion with pt explaining importance of compliance with staff recommendations. He was agreeable to wear supplemental O2, 3L at rest 91%, up from 80% on room air with gait RN notified. Agrees to participate with physical therapy to improve his functional abilities. Pt will benefit from skilled PT to increase their independence and safety with mobility to allow discharge to the venue listed below.       Follow Up Recommendations SNF    Equipment Recommendations   (TBD next venue of care)    Recommendations for Other Services       Precautions / Restrictions Precautions Precautions: Fall Precaution Comments: Monitor O2 Restrictions Weight Bearing Restrictions: No      Mobility  Bed Mobility Overal bed mobility: Modified Independent             General bed mobility comments: extra time, no physical assist needed  Transfers Overall transfer level: Needs assistance Equipment used: Rolling walker (2 wheeled) Transfers: Sit to/from Stand Sit to Stand: Min assist         General transfer comment: Practiced x2 from bed. Min guard initially however second attempt required Min  assist due to LOB anteriorly. VC for hand placement. Relies heavily on RW.  Ambulation/Gait Ambulation/Gait assistance: Min assist Ambulation Distance (Feet): 45 Feet Assistive device: Rolling walker (2 wheeled) Gait Pattern/deviations: Step-to pattern;Step-through pattern;Decreased step length - right;Decreased stance time - left;Decreased dorsiflexion - left;Decreased stride length;Shuffle;Antalgic;Drifts right/left;Trunk flexed Gait velocity: slow Gait velocity interpretation: <1.8 ft/sec, indicative of risk for recurrent falls General Gait Details: Dragging LLE initially but improved as distance increased. Required several standing rest breaks to complete distance. Required frequent min assist for balance and walker placement for proximity. 4/4 dyspnea, refused supplemental O2, sats down to 80% with this short distance. Cues for improved symmetry of gait and upright posture.  Stairs            Wheelchair Mobility    Modified Rankin (Stroke Patients Only)       Balance Overall balance assessment: Needs assistance Sitting-balance support: No upper extremity supported;Feet supported Sitting balance-Leahy Scale: Good     Standing balance support: Bilateral upper extremity supported Standing balance-Leahy Scale: Poor                               Pertinent Vitals/Pain Pain Assessment: No/denies pain    Home Living Family/patient expects to be discharged to:: Skilled nursing facility Living Arrangements: Non-relatives/Friends Available Help at Discharge:  (n/a) Type of Home: Apartment ("boarding house") Home Access: Stairs to enter Entrance Stairs-Rails: Right Entrance Stairs-Number of Steps: 3 Home Layout: One level        Prior Function Level of Independence: Independent with assistive device(s)         Comments: Had  a cane but lost it. States he could bath/dress himself but it was very difficult     Hand Dominance   Dominant Hand: Right     Extremity/Trunk Assessment   Upper Extremity Assessment: Defer to OT evaluation           Lower Extremity Assessment: RLE deficits/detail;LLE deficits/detail;Generalized weakness RLE Deficits / Details: 4/5 ankle dorsiflexion, LLE Deficits / Details: 4/5 ankle dorsiflexion,     Communication   Communication: No difficulties  Cognition Arousal/Alertness: Awake/alert Behavior During Therapy: Agitated Overall Cognitive Status: Impaired/Different from baseline Area of Impairment: Safety/judgement         Safety/Judgement: Decreased awareness of safety     General Comments: refused supplemental O2    General Comments General comments (skin integrity, edema, etc.): Long discussion to patient explaining importance of medical compliance, need for supplemental O2, and willingness to participate with therapy to improve his functional independence and health. Agreeable to work with therapy, and wear supplemental O2 as recommended. Pt placed on 3L supplemental O2 at end of session and SpO2 improved to 91%. Dyspnea improved 2/4. RN notified.    Exercises        Assessment/Plan    PT Assessment Patient needs continued PT services  PT Diagnosis Difficulty walking;Abnormality of gait;Generalized weakness   PT Problem List Decreased strength;Decreased range of motion;Decreased activity tolerance;Decreased balance;Decreased mobility;Decreased knowledge of use of DME;Decreased safety awareness;Cardiopulmonary status limiting activity;Impaired sensation  PT Treatment Interventions DME instruction;Gait training;Functional mobility training;Therapeutic activities;Therapeutic exercise;Balance training;Neuromuscular re-education;Patient/family education   PT Goals (Current goals can be found in the Care Plan section) Acute Rehab PT Goals Patient Stated Goal: Get help so I can stop being sick. PT Goal Formulation: With patient Time For Goal Achievement: 12/31/15 Potential to Achieve Goals:  Fair    Frequency Min 2X/week   Barriers to discharge Decreased caregiver support      Co-evaluation               End of Session Equipment Utilized During Treatment: Gait belt;Oxygen Activity Tolerance: Patient limited by fatigue Patient left: in bed;with call bell/phone within reach;with bed alarm set Nurse Communication: Mobility status (SpO2)         Time: [id number] PT Time Calculation (min) (ACUTE ONLY): 24 min   Charges:   PT Evaluation $PT Eval Moderate Complexity: 1 Procedure PT Treatments $Gait Training: 8-22 mins   PT G CodesBerton Mount:        Gleason Ardoin S 12/17/2015, 1:24 PM  Sunday SpillersLogan Secor RankinBar[place], [place] CarolinaPT [phone]

## 2015-12-17 NOTE — Progress Notes (Signed)
[facility] PROGRESS NOTE  Clayton [name] ZOX:[id number] DOB: January 06, 1951 DOA: 12/16/2015 PCP: PROVIDER NOT IN SYSTEM  Assessment/Plan:  Principal Problem:   HCAP (healthcare-associated pneumonia): lost IV access, and refuses PICC. Will change to oral levaquin Active Problems:   Alcohol abuse   Acute renal failure superimposed on stage 3 chronic kidney disease (HCC)   Essential hypertension   Cocaine abuse   Chronic diastolic CHF (congestive heart failure) (HCC)   Homeless   Anemia of chronic disease   Type 2 diabetes mellitus with hyperosmolar nonketotic hyperglycemia (HCC): refusing CBGs   Acute respiratory failure with hypoxia (HCC)   Prolonged Q-T interval on ECG   Noncompliance with CBGs, PICC, medications, recommendations make treatment nearly impossible. Await PT eval. Encouraged pt to have PICC and monitor CBGs. He agrees for the moment.  HPI/Subjective: wount answer most questions, mainly yelling at nursing staff  Objective: Filed Vitals:   12/17/15 0528 12/17/15 0531  BP: 144/70   Pulse: 91   Temp: 97.8 F (36.6 C) 97.8 F (36.6 C)  Resp: 20     Intake/Output Summary (Last 24 hours) at 12/17/15 1209 Last data filed at 12/17/15 1103  Gross per 24 hour  Intake 316.67 ml  Output      0 ml  Net 316.67 ml   There were no vitals filed for this visit.  Exam:   General:  A and o. No respiratory distress.  Cardiovascular: RRR without MGR  Respiratory: CTA without WRR  Abdomen: S, NT, ND  Ext: no CCE  Basic Metabolic Panel:  Recent Labs Lab 12/10/15 2220 12/16/15 1622 12/16/15 1628 12/17/15 0545  NA 134* 135 136 137  K 5.1 4.4 4.3 3.9  CL 102 102 102 105  CO2 24 21*  --  23  GLUCOSE 498* 615* 610* 272*  BUN 34* 35* 35* 37*  CREATININE 1.64* 1.93* 1.70* 2.07*  CALCIUM 8.4* 8.6*  --  8.3*  MG  --  2.1  --   --   PHOS  --  5.0*  --   --    Liver Function Tests:  Recent Labs Lab 12/10/15 2220 12/16/15 1622 12/17/15 0545  AST 35 20  21  ALT 23 19 18   ALKPHOS 109 99 79  BILITOT 0.6 0.2* 0.5  PROT 5.5* 5.6* 5.4*  ALBUMIN 2.2* 2.0* 1.6*    Recent Labs Lab 12/10/15 2220  LIPASE 17   No results for input(s): AMMONIA in the last 168 hours. CBC:  Recent Labs Lab 12/10/15 2220 12/16/15 1622 12/16/15 1628 12/17/15 0545  WBC 6.3 10.0  --  9.4  NEUTROABS  --  8.8*  --  7.6  HGB 9.3* 8.4* 9.9* 7.4*  HCT 28.5* 26.4* 29.0* 22.8*  MCV 88.5 90.4  --  88.7  PLT 224 313  --  286   Cardiac Enzymes:  Recent Labs Lab 12/16/15 1622  TROPONINI 0.07*   BNP (last 3 results)  Recent Labs  08/17/15 0330 12/04/15 0422 12/16/15 1913  BNP 303.5* 557.6* 1348.3*    ProBNP (last 3 results) No results for input(s): PROBNP in the last 8760 hours.  CBG:  Recent Labs Lab 12/12/15 2205 12/13/15 0757 12/13/15 1216 12/16/15 1620 12/16/15 2229  GLUCAP 265* 183* 206* 537* 419*    Recent Results (from the past 240 hour(s))  Blood culture (routine x 2)     Status: None   Collection Time: 12/11/15  8:34 AM  Result Value Ref Range Status   Specimen Description BLOOD LEFT FOREARM  Final   Special Requests IN PEDIATRIC BOTTLE 3CC  Final   Culture NO GROWTH 5 DAYS  Final   Report Status 12/16/2015 FINAL  Final  Blood culture (routine x 2)     Status: None   Collection Time: 12/11/15  9:15 AM  Result Value Ref Range Status   Specimen Description BLOOD ARM LEFT UPPER  Final   Special Requests IN PEDIATRIC BOTTLE 3CC  Final   Culture NO GROWTH 5 DAYS  Final   Report Status 12/16/2015 FINAL  Final  Culture, blood (Routine X 2) w Reflex to ID Panel     Status: None (Preliminary result)   Collection Time: 12/16/15  5:32 PM  Result Value Ref Range Status   Specimen Description BLOOD RIGHT HAND  Final   Special Requests IN PEDIATRIC BOTTLE 3CC  Final   Culture NO GROWTH < 24 HOURS  Final   Report Status PENDING  Incomplete  Culture, blood (Routine X 2) w Reflex to ID Panel     Status: None (Preliminary result)    Collection Time: 12/16/15  7:13 PM  Result Value Ref Range Status   Specimen Description BLOOD  Final   Special Requests BOTTLES DRAWN AEROBIC ONLY 6CC  Final   Culture NO GROWTH < 24 HOURS  Final   Report Status PENDING  Incomplete     Studies: Dg Chest Portable 1 View  12/16/2015  CLINICAL DATA:  Hypertension.  Hyperglycemia. EXAM: PORTABLE CHEST 1 VIEW COMPARISON:  12/13/2015 FINDINGS: Heart size is mildly enlarged. The lung volumes are low. There are diffuse bilateral lung opacities which may reflect multifocal infection or diffuse pulmonary edema. When compared with previous exam the aeration to lungs have worsened in the interval. IMPRESSION: 1. Worsening bilateral lung opacities compatible with multifocal infection and/or edema. Electronically Signed   By: Signa Kell M.D.   On: 12/16/2015 16:28    Scheduled Meds: . ceFEPime (MAXIPIME) IV  1 g Intravenous 3 times per day  . diltiazem  120 mg Oral Q12H  . enoxaparin (LOVENOX) injection  40 mg Subcutaneous Q24H  . ferrous sulfate  325 mg Oral Q breakfast  . folic acid  1 mg Oral Daily  . glyBURIDE  10 mg Oral BID WC  . guaiFENesin  600 mg Oral BID  . hydrALAZINE  25 mg Oral 4 times per day  . insulin aspart  0-15 Units Subcutaneous TID WC  . insulin aspart  0-5 Units Subcutaneous QHS  . ipratropium-albuterol  3 mL Nebulization Q6H  . LORazepam  0-4 mg Intravenous Q6H   Followed by  . [START ON 12/18/2015] LORazepam  0-4 mg Intravenous Q12H  . multivitamin with minerals  1 tablet Oral Daily  . thiamine  100 mg Oral Daily   Or  . thiamine  100 mg Intravenous Daily  . vancomycin  750 mg Intravenous Q12H   Continuous Infusions: . sodium chloride 50 mL/hr at 12/17/15 0022    Time spent: 15 minutes  Dyson Sevey L  [facility] [email or web address], password [facility] 12/17/2015, 12:09 PM  LOS: 1 day

## 2015-12-17 NOTE — Progress Notes (Signed)
Utilization review completed.  

## 2015-12-18 ENCOUNTER — Inpatient Hospital Stay (HOSPITAL_COMMUNITY): Payer: Medicaid Other

## 2015-12-18 DIAGNOSIS — E1101 Type 2 diabetes mellitus with hyperosmolarity with coma: Secondary | ICD-10-CM

## 2015-12-18 LAB — GLUCOSE, CAPILLARY
GLUCOSE-CAPILLARY: 210 mg/dL — AB (ref 65–99)
GLUCOSE-CAPILLARY: 273 mg/dL — AB (ref 65–99)
GLUCOSE-CAPILLARY: 317 mg/dL — AB (ref 65–99)
GLUCOSE-CAPILLARY: 249 mg/dL — AB (ref 65–99)

## 2015-12-18 MED ORDER — HYDRALAZINE HCL 50 MG PO TABS
50.0000 mg | ORAL_TABLET | Freq: Three times a day (TID) | ORAL | Status: DC
  Administered 2015-12-18 – 2015-12-24 (×18): 50 mg via ORAL
  Filled 2015-12-18 (×19): qty 1

## 2015-12-18 MED ORDER — IPRATROPIUM-ALBUTEROL 0.5-2.5 (3) MG/3ML IN SOLN
3.0000 mL | Freq: Three times a day (TID) | RESPIRATORY_TRACT | Status: DC
Start: 2015-12-19 — End: 2015-12-20
  Administered 2015-12-20: 3 mL via RESPIRATORY_TRACT
  Filled 2015-12-18 (×2): qty 3

## 2015-12-18 MED ORDER — LORAZEPAM 1 MG PO TABS
0.0000 mg | ORAL_TABLET | Freq: Two times a day (BID) | ORAL | Status: AC
  Administered 2015-12-20: 1 mg via ORAL
  Filled 2015-12-18: qty 1

## 2015-12-18 MED ORDER — LORAZEPAM 1 MG PO TABS
0.0000 mg | ORAL_TABLET | Freq: Four times a day (QID) | ORAL | Status: AC
  Administered 2015-12-18 (×2): 4 mg via ORAL
  Filled 2015-12-18: qty 1
  Filled 2015-12-18 (×2): qty 4

## 2015-12-18 MED ORDER — INSULIN DETEMIR 100 UNIT/ML ~~LOC~~ SOLN
10.0000 [IU] | Freq: Every day | SUBCUTANEOUS | Status: DC
  Administered 2015-12-18 – 2015-12-19 (×2): 10 [IU] via SUBCUTANEOUS
  Filled 2015-12-18 (×2): qty 0.1

## 2015-12-18 NOTE — Clinical Social Work Note (Signed)
Clinical Social Work Assessment  Patient Details  Name: Clayton [name] MRN: [id number] Date of Birth: July 05, 1951  Date of referral:  12/18/15               Reason for consult:  Facility Placement                Permission sought to share information with:  Facility Sport and exercise psychologist, Other Permission granted to share information::  Yes, Verbal Permission Granted  Name::     Russella Dar, [phone]  Agency::  SNFs  Relationship::  Liliane Bade Information:  [phone]  Housing/Transportation Living arrangements for the past 2 months:  International Paper of Information:  Patient Patient Interpreter Needed:  None Criminal Activity/Legal Involvement Pertinent to Current Situation/Hospitalization:  No - Comment as needed Significant Relationships:  Other Family Members, Friend, Church Lives with:  Other (Comment) [employer] members) Do you feel safe going back to the place where you live?  No Need for family participation in patient care:  Yes (Comment)  Care giving concerns:  CSW received referral for possible SNF placement at time of discharge. CSW met with patient regarding PT recommendation of SNF placement at time of discharge. Patient reported having a Payee, Clietis Bronson/[employer], that does not allow patient to have food or cigarettes. Patient stated he "should be able to spend his money how he wants". CSW attempted to contact Dorita Fray to clarify living situation, but had to leave a message. Patient stated he was not going back to "that house" and that he would be homeless after he got out of the hospital. Patient is interested in rehab at a SNF. Patient expressed understanding of PT recommendation and is agreeable to SNF placement at time of discharge. CSW to continue to follow and assist with discharge planning needs.   Social Worker assessment / plan:  CSW spoke with patient concerning possibility of rehab at [facility] before returning  home.  Employment status:  Disabled (Comment on whether or not currently receiving Disability) Insurance information:  Medicaid In [place] PT Recommendations:  [place] / Referral to community resources:  [place]  Patient/Family's Response to care:  Patient recognizes need for rehab before returning home and is agreeable to a SNF.   Patient/Family's Understanding of and Emotional Response to Diagnosis, Current Treatment, and Prognosis:  Patient is realistic regarding therapy needs. No questions/concerns about plan or treatment.    Emotional Assessment Appearance:  Appears older than stated age Attitude/Demeanor/Rapport:  Hostile, Complaining Affect (typically observed):  Agitated, Irritable Orientation:  Oriented to Self, Oriented to Place, Oriented to  Time, Oriented to Situation Alcohol / Substance use:  Other (Did not assess, but has hx according to MD.) Psych involvement (Current and /or in the community):  No (Comment)  Discharge Needs  Concerns to be addressed:  Care Coordination Readmission within the last 30 days:  Yes Current discharge risk:  None Barriers to Discharge:  Continued Medical Work up   [employer], [place] 12/18/2015, 10:28 AM

## 2015-12-18 NOTE — Progress Notes (Signed)
RN paged this NP because pt's neuro exam had changed from previous. RN reports pt's pupils are non equal and he seems "weaker" than previously documented. Pt fell today and apparently hit head. Stat CT head shows no acute intracranial process. Continue neuro checks.  KJKG, NP [facility]

## 2015-12-18 NOTE — Progress Notes (Signed)
[facility] PROGRESS NOTE  Clayton [name]:[id number] DOB: 01-20-1951 DOA: 12/16/2015 PCP: PROVIDER NOT IN SYSTEM  Assessment/Plan:  Principal Problem:   HCAP (healthcare-associated pneumonia): lost IV access, and continues to refuse PICC. Continue oral levaquin Active Problems:   Alcohol abuse: continue CIWA protocol   Acute renal failure superimposed on stage 3 chronic kidney disease (HCC): refuses labs today.   Essential hypertension: continue diltiazem. Change hydralazine to 50 tid   Cocaine abuse   Chronic diastolic CHF (congestive heart failure) (HCC) possibly an acute component, given BNP above 1000, though weights stable. Continue bid lasix   Anemia of chronic disease: no reported bleeding. Refuses labs today.   Type 2 diabetes mellitus with hyperosmolar nonketotic hyperglycemia (HCC): has been compliant with CBGs and SSI today. Continue glyburide. Metformin stopped due to renal failure. Will add lantus.   Acute respiratory failure with hypoxia (HCC): intermittently noncompliant with oxygen. Per RN, desaturates off oxygen   Prolonged Q-T interval on ECG: noncompliant with tele   Deconditioning: PT recommending SNF and patient agrees. Larey Seat today while ambulating unassisted. No significant injury. To SNF when bed found  HPI/Subjective: Breathing a little easier today. When asked about labs, started yelling at me, refuses draw.  Objective: Filed Vitals:   12/18/15 1210 12/18/15 1223  BP: 146/76 148/74  Pulse: 91 89  Temp: 98.5 F (36.9 C) 97.8 F (36.6 C)  Resp: 24 20    Intake/Output Summary (Last 24 hours) at 12/18/15 1254 Last data filed at 12/18/15 0736  Gross per 24 hour  Intake    960 ml  Output      0 ml  Net    960 ml   Filed Weights   12/18/15 1000  Weight: 80.015 kg (176 lb 6.4 oz)    Exam:   General:  A and o. No respiratory distress. Intermittently yells, but apologizes when asked to lower  voice  Cardiovascular: RRR without  MGR  Respiratory: rales at bases  Abdomen: S, NT, ND  Ext: no CCE  Basic Metabolic Panel:  Recent Labs Lab 12/16/15 1622 12/16/15 1628 12/17/15 0545  NA 135 136 137  K 4.4 4.3 3.9  CL 102 102 105  CO2 21*  --  23  GLUCOSE 615* 610* 272*  BUN 35* 35* 37*  CREATININE 1.93* 1.70* 2.07*  CALCIUM 8.6*  --  8.3*  MG 2.1  --   --   PHOS 5.0*  --   --    Liver Function Tests:  Recent Labs Lab 12/16/15 1622 12/17/15 0545  AST 20 21  ALT 19 18  ALKPHOS 99 79  BILITOT 0.2* 0.5  PROT 5.6* 5.4*  ALBUMIN 2.0* 1.6*   No results for input(s): LIPASE, AMYLASE in the last 168 hours. No results for input(s): AMMONIA in the last 168 hours. CBC:  Recent Labs Lab 12/16/15 1622 12/16/15 1628 12/17/15 0545  WBC 10.0  --  9.4  NEUTROABS 8.8*  --  7.6  HGB 8.4* 9.9* 7.4*  HCT 26.4* 29.0* 22.8*  MCV 90.4  --  88.7  PLT 313  --  286   Cardiac Enzymes:  Recent Labs Lab 12/16/15 1622  TROPONINI 0.07*   BNP (last 3 results)  Recent Labs  08/17/15 0330 12/04/15 0422 12/16/15 1913  BNP 303.5* 557.6* 1348.3*    ProBNP (last 3 results) No results for input(s): PROBNP in the last 8760 hours.  CBG:  Recent Labs Lab 12/17/15 1159 12/17/15 1705 12/17/15 2227 12/18/15 0758 12/18/15 1156  GLUCAP 422* 301* 210* 273* 317*    Recent Results (from the past 240 hour(s))  Blood culture (routine x 2)     Status: None   Collection Time: 12/11/15  8:34 AM  Result Value Ref Range Status   Specimen Description BLOOD LEFT FOREARM  Final   Special Requests IN PEDIATRIC BOTTLE 3CC  Final   Culture NO GROWTH 5 DAYS  Final   Report Status 12/16/2015 FINAL  Final  Blood culture (routine x 2)     Status: None   Collection Time: 12/11/15  9:15 AM  Result Value Ref Range Status   Specimen Description BLOOD ARM LEFT UPPER  Final   Special Requests IN PEDIATRIC BOTTLE 3CC  Final   Culture NO GROWTH 5 DAYS  Final   Report Status 12/16/2015 FINAL  Final  Culture, blood (Routine X  2) w Reflex to ID Panel     Status: None (Preliminary result)   Collection Time: 12/16/15  5:32 PM  Result Value Ref Range Status   Specimen Description BLOOD RIGHT HAND  Final   Special Requests IN PEDIATRIC BOTTLE 3CC  Final   Culture NO GROWTH < 24 HOURS  Final   Report Status PENDING  Incomplete  Culture, blood (Routine X 2) w Reflex to ID Panel     Status: None (Preliminary result)   Collection Time: 12/16/15  7:13 PM  Result Value Ref Range Status   Specimen Description BLOOD  Final   Special Requests BOTTLES DRAWN AEROBIC ONLY 6CC  Final   Culture NO GROWTH < 24 HOURS  Final   Report Status PENDING  Incomplete     Studies: Dg Chest Portable 1 View  12/16/2015  CLINICAL DATA:  Hypertension.  Hyperglycemia. EXAM: PORTABLE CHEST 1 VIEW COMPARISON:  12/13/2015 FINDINGS: Heart size is mildly enlarged. The lung volumes are low. There are diffuse bilateral lung opacities which may reflect multifocal infection or diffuse pulmonary edema. When compared with previous exam the aeration to lungs have worsened in the interval. IMPRESSION: 1. Worsening bilateral lung opacities compatible with multifocal infection and/or edema. Electronically Signed   By: Signa Kellaylor  [place] M.D.   On: 12/16/2015 16:28    Scheduled Meds: . diltiazem  120 mg Oral Q12H  . enoxaparin (LOVENOX) injection  40 mg Subcutaneous Q24H  . ferrous sulfate  325 mg Oral Q breakfast  . folic acid  1 mg Oral Daily  . furosemide  40 mg Oral BID  . glyBURIDE  10 mg Oral BID WC  . guaiFENesin  600 mg Oral BID  . hydrALAZINE  25 mg Oral 4 times per day  . insulin aspart  0-15 Units Subcutaneous TID WC  . insulin aspart  0-5 Units Subcutaneous QHS  . ipratropium-albuterol  3 mL Nebulization Q6H  . levofloxacin  500 mg Oral Daily  . LORazepam  0-4 mg Oral Q6H   Followed by  . [START ON 12/20/2015] LORazepam  0-4 mg Oral Q12H  . multivitamin with minerals  1 tablet Oral Daily  . thiamine  100 mg Oral Daily   Continuous  Infusions:    Time spent: 25 minutes  Zaliah Wissner L  [facility] [email or web address], password [email or web address] 12/18/2015, 12:54 PM  LOS: 2 days

## 2015-12-18 NOTE — Progress Notes (Addendum)
Paged MD about pt's right pupil measuring at a 4 and being nonreactive and the left pupil measuring at a 3 and reacting sluggishly after having a fall on 1/9. MD returned page and ordered a STAT CT of pt's head. Will continue to monitor.

## 2015-12-18 NOTE — Care Management Note (Signed)
Case Management Note  Patient Details  Name: Clayton [name] MRN: [id number] Date of Birth: [birth year]  Subjective/Objective:                  [facility] Admitted with PNA. Anticipate DC to SNF.  Action/Plan:  DC to SNF as facilitated through CSW.  Expected Discharge Date:                  Expected Discharge Plan:  Homeless Shelter  In-House Referral:     Discharge planning Services  CM Consult  Post Acute Care Choice:    Choice offered to:     DME Arranged:    DME Agency:     HH Arranged:    HH Agency:     Status of Service:  In process, will continue to follow  Medicare Important Message Given:    Date Medicare IM Given:    Medicare IM give by:    Date Additional Medicare IM Given:    Additional Medicare Important Message give by:     If discussed at Long Length of Stay Meetings, dates discussed:    Additional Comments:  Lawerance SabalDebbie Urijah Arko, RN 12/18/2015, 3:16 PM

## 2015-12-18 NOTE — [place] FL2 (Signed)
[place] MEDICAID FL2 LEVEL OF CARE SCREENING TOOL     IDENTIFICATION  Patient Name: Clayton [name] Birthdate: May 18, 1951 Sex: male Admission Date (Current Location): 12/16/2015  [facility] [place]   Facility and Address:  [facility], [address], [place], [place] [address]      Provider Number: [id number]  Attending Physician Name and Address:  Christiane Haorinna L Sullivan, MD  Relative Name and Phone Number:  Delton Seeelson, friend, [phone]    Current Level of Care: Hospital Recommended Level of Care: Skilled Nursing Facility Prior Approval Number:    Date Approved/Denied:   PASRR Number: [id number] [id number]  Discharge Plan: SNF    Current Diagnoses: Patient Active Problem List   Diagnosis Date Noted  . Type 2 diabetes mellitus with hyperosmolar nonketotic hyperglycemia (HCC) 12/16/2015  . Acute respiratory failure with hypoxia (HCC) 12/16/2015  . Prolonged Q-T interval on ECG 12/16/2015  . CKD (chronic kidney disease) 12/11/2015  . Diabetes mellitus with complication (HCC) 12/11/2015  . Homeless 12/11/2015  . Anemia of chronic disease 12/11/2015  . Abnormal chest x-ray 12/06/2015  . Hyperglycemia 12/04/2015  . Hypertensive urgency 12/04/2015  . Chronic diastolic CHF (congestive heart failure) (HCC) 12/04/2015  . HCAP (healthcare-associated pneumonia)   . Cocaine abuse 10/08/2015  . Acute renal failure superimposed on stage 3 chronic kidney disease (HCC) 08/17/2015  . Essential hypertension   . Noncompliance with medications 12/13/2011  . Alcohol abuse 12/13/2011  . Hyponatremia 12/01/2011    Orientation RESPIRATION BLADDER Height & Weight    Self, Time, Situation, Place  O2 (Nasal Cannula 2L/min) Incontinent   168 lbs.  BEHAVIORAL SYMPTOMS/MOOD NEUROLOGICAL BOWEL NUTRITION STATUS   (N/A)  (N/A) Continent  (Please see DC Summary)  AMBULATORY STATUS COMMUNICATION OF NEEDS Skin   Extensive Assist Verbally Normal                       Personal Care Assistance Level of Assistance  Bathing, Feeding, Dressing Bathing Assistance: Limited assistance Feeding assistance: Independent Dressing Assistance: Limited assistance     Functional Limitations Info             SPECIAL CARE FACTORS FREQUENCY  PT (By licensed PT)     PT Frequency: 5x/week              Contractures      Additional Factors Info  Code Status, Allergies, Insulin Sliding Scale Code Status Info: Full Allergies Info: Latex   Insulin Sliding Scale Info: insulin aspart (novoLOG) injection 0-15 Units 3 times daily       Current Medications (12/18/2015):  This is the current hospital active medication list Current Facility-Administered Medications  Medication Dose Route Frequency Provider Last Rate Last Dose  . diltiazem (CARDIZEM SR) 12 hr capsule 120 mg  120 mg Oral Q12H Russella DarAllison L Ellis, NP   120 mg at 12/18/15 1018  . enoxaparin (LOVENOX) injection 40 mg  40 mg Subcutaneous Q24H Russella DarAllison L Ellis, NP   40 mg at 12/17/15 2120  . ferrous sulfate tablet 325 mg  325 mg Oral Q breakfast Russella DarAllison L Ellis, NP   325 mg at 12/18/15 0824  . folic acid (FOLVITE) tablet 1 mg  1 mg Oral Daily Russella DarAllison L Ellis, NP   1 mg at 12/18/15 1018  . furosemide (LASIX) tablet 40 mg  40 mg Oral BID Christiane Haorinna L Sullivan, MD   40 mg at 12/18/15 0824  . glyBURIDE (DIABETA) tablet 10 mg  10 mg Oral BID WC Russella Dar, NP   10 mg at 12/18/15 0824  . guaiFENesin (MUCINEX) 12 hr tablet 600 mg  600 mg Oral BID Russella Dar, NP   600 mg at 12/18/15 1018  . hydrALAZINE (APRESOLINE) tablet 50 mg  50 mg Oral TID Christiane Ha, MD      . insulin aspart (novoLOG) injection 0-15 Units  0-15 Units Subcutaneous TID WC Russella Dar, NP   11 Units at 12/18/15 1220  . insulin aspart (novoLOG) injection 0-5 Units  0-5 Units Subcutaneous QHS Russella Dar, NP   2 Units at 12/17/15 2234  . insulin detemir (LEVEMIR) injection 10 Units  10 Units Subcutaneous Daily  Christiane Ha, MD   10 Units at 12/18/15 1442  . ipratropium-albuterol (DUONEB) 0.5-2.5 (3) MG/3ML nebulizer solution 3 mL  3 mL Nebulization Q6H Russella Dar, NP   3 mL at 12/18/15 1341  . levofloxacin (LEVAQUIN) tablet 500 mg  500 mg Oral Daily Christiane Ha, MD   500 mg at 12/18/15 1018  . LORazepam (ATIVAN) tablet 1 mg  1 mg Oral Q6H PRN Russella Dar, NP       Or  . LORazepam (ATIVAN) injection 1 mg  1 mg Intravenous Q6H PRN Russella Dar, NP      . LORazepam (ATIVAN) tablet 0-4 mg  0-4 mg Oral Q6H Christiane Ha, MD   4 mg at 12/18/15 1220   Followed by  . [START ON 12/20/2015] LORazepam (ATIVAN) tablet 0-4 mg  0-4 mg Oral Q12H Christiane Ha, MD      . multivitamin with minerals tablet 1 tablet  1 tablet Oral Daily Russella Dar, NP   1 tablet at 12/18/15 1018  . thiamine (VITAMIN B-1) tablet 100 mg  100 mg Oral Daily Russella Dar, NP   100 mg at 12/18/15 1018     Discharge Medications: Please see discharge summary for a list of discharge medications.  Relevant Imaging Results:  Relevant Lab Results:   Additional Information SSN: [id number] [place], [place]

## 2015-12-18 NOTE — Progress Notes (Signed)
Patient very verbally abusive to staff Teacher, adult educationespecially writer. MD and charge nurse notified.

## 2015-12-18 NOTE — Progress Notes (Signed)
OT Cancellation Note  Patient Details Name: Clayton [name] MRN: [id number] DOB: 1951/09/06   Cancelled Treatment:    Reason Eval/Treat Not Completed: Other (comment) (RT in with patient). Will check back as schedule allows for OT eval/treat.   Edwin CapPatricia Shakena Callari , MS, OTR/L, CLT Pager: [phone]  12/18/2015, 1:45 PM

## 2015-12-18 NOTE — Progress Notes (Addendum)
Inpatient Diabetes Program Recommendations  AACE/ADA: New Consensus Statement on Inpatient Glycemic Control (2015)  Target Ranges:  Prepandial:   less than 140 mg/dL      Peak postprandial:   less than 180 mg/dL (1-2 hours)      Critically ill patients:  140 - 180 mg/dL   Review of Glycemic Control  Diabetes history: Type 2 Outpatient Diabetes medications: Glyburide and metformin (homeless) Current orders for Inpatient glycemic control: Moderate tidwc and HS correction scales.  Inpatient Diabetes Program Recommendations: Glucose levels maintained in 200's and 300's.   Insulin - Basal: Please consider addition of low dose basal lantus at 10 units in addition to moderate and HS correction scales.   Thank you Lenor CoffinAnn Aven Christen, RN, MSN, CDE  Diabetes Inpatient Program Office: [phone] Pager: [phone] 8:00 am to 5:00 pm

## 2015-12-19 ENCOUNTER — Inpatient Hospital Stay (HOSPITAL_COMMUNITY): Payer: Medicaid Other

## 2015-12-19 LAB — CBC
HEMATOCRIT: 22.2 % — AB (ref 39.0–52.0)
Hemoglobin: 7.3 g/dL — ABNORMAL LOW (ref 13.0–17.0)
MCH: 29.6 pg (ref 26.0–34.0)
MCHC: 32.9 g/dL (ref 30.0–36.0)
MCV: 89.9 fL (ref 78.0–100.0)
PLATELETS: 291 10*3/uL (ref 150–400)
RBC: 2.47 MIL/uL — AB (ref 4.22–5.81)
RDW: 14.8 % (ref 11.5–15.5)
WBC: 8.7 10*3/uL (ref 4.0–10.5)

## 2015-12-19 LAB — GLUCOSE, CAPILLARY
GLUCOSE-CAPILLARY: 104 mg/dL — AB (ref 65–99)
GLUCOSE-CAPILLARY: 243 mg/dL — AB (ref 65–99)
GLUCOSE-CAPILLARY: 175 mg/dL — AB (ref 65–99)
GLUCOSE-CAPILLARY: 192 mg/dL — AB (ref 65–99)
Glucose-Capillary: 379 mg/dL — ABNORMAL HIGH (ref 65–99)

## 2015-12-19 LAB — BASIC METABOLIC PANEL
Anion gap: 8 (ref 5–15)
BUN: 55 mg/dL — AB (ref 6–20)
CHLORIDE: 105 mmol/L (ref 101–111)
CO2: 23 mmol/L (ref 22–32)
CREATININE: 2.74 mg/dL — AB (ref 0.61–1.24)
Calcium: 8 mg/dL — ABNORMAL LOW (ref 8.9–10.3)
GFR calc Af Amer: 27 mL/min — ABNORMAL LOW (ref >60)
GFR calc non Af Amer: 23 mL/min — ABNORMAL LOW (ref >60)
Glucose, Bld: 248 mg/dL — ABNORMAL HIGH (ref 65–99)
POTASSIUM: 4.1 mmol/L (ref 3.5–5.1)
Sodium: 136 mmol/L (ref 135–145)

## 2015-12-19 LAB — HIV ANTIBODY (ROUTINE TESTING W REFLEX): HIV Screen 4th Generation wRfx: NONREACTIVE

## 2015-12-19 MED ORDER — INSULIN DETEMIR 100 UNIT/ML ~~LOC~~ SOLN
14.0000 [IU] | Freq: Every day | SUBCUTANEOUS | Status: DC
  Administered 2015-12-20 – 2015-12-22 (×3): 14 [IU] via SUBCUTANEOUS
  Filled 2015-12-19 (×3): qty 0.14

## 2015-12-19 MED ORDER — LEVOFLOXACIN 500 MG PO TABS: 500.0000 mg | ORAL_TABLET | ORAL | Status: DC

## 2015-12-19 NOTE — Progress Notes (Signed)
Physical Therapy Treatment Patient Details Name: Clayton [name] L Pruss MRN: [id number] DOB: 06-06-1951 Today's Date: 12/19/2015    History of Present Illness 65 y.o. male with Hx of CKD, anemia, HTN, DM II, DVT, and depression. Admitted with principle problems of HCAP / Acute respiratory failure with hypoxia,Type 2 diabetes mellitus with hyperosmolar nonketotic hyperglycemia , chronic diastolic CHF, and  Noncompliance with medications/therapies    PT Comments    Patient noted to demonstrate behaviors which indicate poor understanding of safety and physical ability. Session limited by the patient's willingness to participate. Significant time spent encouraging patient to participate and with education on safety concerns throughout session. Patient refused to use O2 during ambulation and did not follow cues for safety. Additionally patient is noted to be verbally aggressive throughout session. Currently the patient is at a high fall risk and SNF is being recommended at D/C.   Follow Up Recommendations  SNF;Supervision/Assistance - 24 hour     Equipment Recommendations  None recommended by PT (to be determined at next level of care)    Recommendations for Other Services       Precautions / Restrictions Precautions Precautions: Fall Precaution Comments: Monitor O2 Restrictions Weight Bearing Restrictions: No    Mobility  Bed Mobility Overal bed mobility: Needs Assistance Bed Mobility: Supine to Sit     Supine to sit: Min assist Sit to supine: Supervision   General bed mobility comments: patient requesting PT pull patient up, not following cues for technique.   Transfers Overall transfer level: Needs assistance Equipment used: Rolling walker (2 wheeled) Transfers: Sit to/from Stand Sit to Stand: Min assist         General transfer comment: not following cues for hand placement, asking to be lifted up out of bed. Not following cues for safe sitting technique as patient will not  fully turn prior to sitting.   Ambulation/Gait Ambulation/Gait assistance: Min assist Ambulation Distance (Feet): 40 Feet Assistive device: Rolling walker (2 wheeled) Gait Pattern/deviations: Decreased step length - right;Decreased step length - left;Trunk flexed Gait velocity: slow   General Gait Details: Patient leaning forward on walker and attempting to use from front cross bar, repeated cues needed for safety, patient inconsistently following cues.    Stairs            Wheelchair Mobility    Modified Rankin (Stroke Patients Only)       Balance Overall balance assessment: Needs assistance Sitting-balance support: No upper extremity supported Sitting balance-Leahy Scale: Fair     Standing balance support: Bilateral upper extremity supported Standing balance-Leahy Scale: Poor Standing balance comment: poor balance and safety awareness.                     Cognition Arousal/Alertness: Awake/alert Behavior During Therapy: Agitated Overall Cognitive Status: No family/caregiver present to determine baseline cognitive functioning Area of Impairment: Safety/judgement         Safety/Judgement: Decreased awareness of safety     General Comments: refused supplemental O2, refusing to follow commands for safety.     Exercises      General Comments General comments (skin integrity, edema, etc.): Patient verbally aggressive throughout session, not following commands for safety throughout. Patient appears to have poor insight into mobility and safety. Multiple transfers performed during session with for hygine related tasks as patient voiding while sitting edge of bed. Patient refusing O2 during ambulation, SpO2 92% prior to ambulation and 81% upon return. Patient agreeing to O2 upon return from ambulation.  Pertinent Vitals/Pain Pain Assessment:  (not responding to question)    Home Living                      Prior Function            PT  Goals (current goals can now be found in the care plan section) Acute Rehab PT Goals Patient Stated Goal: watch TV PT Goal Formulation: With patient Time For Goal Achievement: 12/31/15 Potential to Achieve Goals: Fair Progress towards PT goals: Not progressing toward goals - comment    Frequency  Min 2X/week    PT Plan Current plan remains appropriate    Co-evaluation             End of Session Equipment Utilized During Treatment: Gait belt;Oxygen Activity Tolerance: Patient limited by fatigue Patient left: in bed;with call bell/phone within reach;with bed alarm set;with nursing/sitter in room;Other (comment) (O2 on)     Time: 1133-1209 PT Time Calculation (min) (ACUTE ONLY): 36 min  Charges:  $Gait Training: 8-22 mins $Therapeutic Activity: 8-22 mins                    G Codes:      Christiane Ha, PT, CSCS Pager [phone] Office [phone]  12/19/2015, 12:40 PM

## 2015-12-19 NOTE — Progress Notes (Signed)
OT Cancellation Note  Patient Details Name: Clayton [name] MRN: [id number] DOB: 09/08/1951   Cancelled Treatment:    Reason Eval/Treat Not Completed: Pain limiting ability to participate  Pt not agreeable to participating in OT eval stating "just leave me alone and let me rest".  Pt's O2 sats 84-86% on room air.  Pt refusing to donn nasal cannula at this time.  Nursing made aware.  Will attempt to see pt again tomorrow for therapy services.     Blayke Cordrey OTR/L 12/19/2015, 3:10 PM

## 2015-12-19 NOTE — Progress Notes (Signed)
[facility] PROGRESS NOTE  Clayton [name] ZOX:[id number] DOB: 03-30-1951 DOA: 12/16/2015 PCP: PROVIDER NOT IN SYSTEM  Assessment/Plan:  Principal Problem:   HCAP (healthcare-associated pneumonia): lost IV access, and continues to refuse PICC. Continue oral levaquin. Repeat CXR c/w multifocal pneumonia Active Problems:   Alcohol abuse: continue CIWA protocol   Acute renal failure superimposed on stage 3 chronic kidney disease (HCC): worse. Will d/c lasix   Essential hypertension: continue diltiazem. Changed hydralazine to 50 tid   Cocaine abuse   Chronic diastolic CHF (congestive heart failure) (HCC) possibly an acute component, given BNP above 1000, though weights stable. Hold lasix due to increased creatinine   Anemia of chronic disease: no reported bleeding. R\   Type 2 diabetes mellitus with hyperosmolar nonketotic hyperglycemia (HCC): has been compliant with CBGs and SSI today. Continue glyburide. Metformin stopped due to renal failure. lantus added   Acute respiratory failure with hypoxia [facility]): intermittently noncompliant with oxygen. Per RN, desaturates off oxygen   Prolonged Q-T interval on ECG: noncompliant with tele   Deconditioning: PT recommending SNF and patient agrees. Fell x2 while getting up without assistance. CT brain without bleed Chronic noncompliance makes caring for this patient very challenging  HPI/Subjective: "black folks always have abnormal chest xrays! My breathing is fine! Leave me alone!"  Objective: Filed Vitals:   12/19/15 1703 12/19/15 1810  BP: 156/64 124/69  Pulse: 90 90  Temp:  98.5 F (36.9 C)  Resp:  14    Intake/Output Summary (Last 24 hours) at 12/19/15 2030 Last data filed at 12/19/15 1700  Gross per 24 hour  Intake    300 ml  Output      0 ml  Net    300 ml   Filed Weights   12/18/15 1000  Weight: 80.015 kg (176 lb 6.4 oz)    Exam:   General:  Yelling at PT. Breathing nonlabored.   Cardiovascular: RRR without  MGR  Respiratory: rales at bases  Abdomen: S, NT, ND  Ext: no CCE  Basic Metabolic Panel:  Recent Labs Lab 12/16/15 1622 12/16/15 1628 12/17/15 0545 12/19/15 0442  NA 135 136 137 136  K 4.4 4.3 3.9 4.1  CL 102 102 105 105  CO2 21*  --  23 23  GLUCOSE 615* 610* 272* 248*  BUN 35* 35* 37* 55*  CREATININE 1.93* 1.70* 2.07* 2.74*  CALCIUM 8.6*  --  8.3* 8.0*  MG 2.1  --   --   --   PHOS 5.0*  --   --   --    Liver Function Tests:  Recent Labs Lab 12/16/15 1622 12/17/15 0545  AST 20 21  ALT 19 18  ALKPHOS 99 79  BILITOT 0.2* 0.5  PROT 5.6* 5.4*  ALBUMIN 2.0* 1.6*   No results for input(s): LIPASE, AMYLASE in the last 168 hours. No results for input(s): AMMONIA in the last 168 hours. CBC:  Recent Labs Lab 12/16/15 1622 12/16/15 1628 12/17/15 0545 12/19/15 0442  WBC 10.0  --  9.4 8.7  NEUTROABS 8.8*  --  7.6  --   HGB 8.4* 9.9* 7.4* 7.3*  HCT 26.4* 29.0* 22.8* 22.2*  MCV 90.4  --  88.7 89.9  PLT 313  --  286 291   Cardiac Enzymes:  Recent Labs Lab 12/16/15 1622  TROPONINI 0.07*   BNP (last 3 results)  Recent Labs  08/17/15 0330 12/04/15 0422 12/16/15 1913  BNP 303.5* 557.6* 1348.3*    ProBNP (last 3 results) No results  for input(s): PROBNP in the last 8760 hours.  CBG:  Recent Labs Lab 12/18/15 1653 12/18/15 2253 12/19/15 0815 12/19/15 1238 12/19/15 1643  GLUCAP 249* 104* 379* 243* 175*    Recent Results (from the past 240 hour(s))  Blood culture (routine x 2)     Status: None   Collection Time: 12/11/15  8:34 AM  Result Value Ref Range Status   Specimen Description BLOOD LEFT FOREARM  Final   Special Requests IN PEDIATRIC BOTTLE 3CC  Final   Culture NO GROWTH 5 DAYS  Final   Report Status 12/16/2015 FINAL  Final  Blood culture (routine x 2)     Status: None   Collection Time: 12/11/15  9:15 AM  Result Value Ref Range Status   Specimen Description BLOOD ARM LEFT UPPER  Final   Special Requests IN PEDIATRIC BOTTLE 3CC  Final    Culture NO GROWTH 5 DAYS  Final   Report Status 12/16/2015 FINAL  Final  Culture, blood (Routine X 2) w Reflex to ID Panel     Status: None (Preliminary result)   Collection Time: 12/16/15  5:32 PM  Result Value Ref Range Status   Specimen Description BLOOD RIGHT HAND  Final   Special Requests IN PEDIATRIC BOTTLE 3CC  Final   Culture NO GROWTH 3 DAYS  Final   Report Status PENDING  Incomplete  Culture, blood (Routine X 2) w Reflex to ID Panel     Status: None (Preliminary result)   Collection Time: 12/16/15  7:13 PM  Result Value Ref Range Status   Specimen Description BLOOD  Final   Special Requests BOTTLES DRAWN AEROBIC ONLY 6CC  Final   Culture NO GROWTH 3 DAYS  Final   Report Status PENDING  Incomplete     Studies: Dg Chest 2 View  12/19/2015  CLINICAL DATA:  Shortness of breath, confusion EXAM: CHEST  2 VIEW COMPARISON:  12/16/2015 FINDINGS: Multifocal patchy opacities, suspicious for multifocal pneumonia, less likely moderate interstitial edema. No definite pleural effusions. No pneumothorax. Cardiomegaly. IMPRESSION: Multifocal patchy opacities, suspicious for multifocal pneumonia, grossly unchanged. Electronically Signed   By: Charline Bills M.D.   On: 12/19/2015 11:14   Ct Head Wo Contrast  12/18/2015  CLINICAL DATA:  Larey Seat and hit back of head today. Unable pupils, weakness. History of chronic kidney disease, hypertension, diabetes, migraine. EXAM: CT HEAD WITHOUT CONTRAST TECHNIQUE: Contiguous axial images were obtained from the base of the skull through the vertex without intravenous contrast. COMPARISON:  CT head October 31, 2015 FINDINGS: The ventricles and sulci are mildly prominent for age. No intraparenchymal hemorrhage, mass effect nor midline shift. Patchy supratentorial white matter hypodensities. No acute large vascular territory infarcts. No abnormal extra-axial fluid collections. Basal cisterns are patent. Moderate calcific atherosclerosis of the carotid siphons. No  skull fracture. LEFT occipital scalp thickening was present on prior examination. The included ocular globes and orbital contents are non-suspicious. Old RIGHT medial orbital blowout fracture. Old nondisplaced bilateral nasal bone fractures. Maxillary mucosal retention cysts. Ethmoid mucosal thickening. IMPRESSION: Acute intracranial process. Mild global parenchymal brain volume loss for age. Mild chronic small vessel ischemic disease. Electronically Signed   By: Awilda Metro M.D.   On: 12/18/2015 23:00    Scheduled Meds: . diltiazem  120 mg Oral Q12H  . enoxaparin (LOVENOX) injection  40 mg Subcutaneous Q24H  . ferrous sulfate  325 mg Oral Q breakfast  . folic acid  1 mg Oral Daily  . glyBURIDE  10 mg Oral BID  WC  . guaiFENesin  600 mg Oral BID  . hydrALAZINE  50 mg Oral TID  . insulin aspart  0-15 Units Subcutaneous TID WC  . insulin aspart  0-5 Units Subcutaneous QHS  . insulin detemir  10 Units Subcutaneous Daily  . ipratropium-albuterol  3 mL Nebulization TID  . [START ON 12/21/2015] levofloxacin  500 mg Oral Q48H  . LORazepam  0-4 mg Oral Q6H   Followed by  . [START ON 12/20/2015] LORazepam  0-4 mg Oral Q12H  . multivitamin with minerals  1 tablet Oral Daily  . thiamine  100 mg Oral Daily   Continuous Infusions:    Time spent: 25 minutes  Dayla Gasca L  [facility] [email or web address], password [email or web address] 12/19/2015, 8:30 PM  LOS: 3 days

## 2015-12-19 NOTE — Progress Notes (Signed)
Inpatient Diabetes Program Recommendations  AACE/ADA: New Consensus Statement on Inpatient Glycemic Control (2015)  Target Ranges:  Prepandial:   less than 140 mg/dL      Peak postprandial:   less than 180 mg/dL (1-2 hours)      Critically ill patients:  140 - 180 mg/dL   Review of Glycemic Control  Noted patient got 10 units lantus last night. HS cbg was 104 mg/dL Fasting glucose this am was 370 mg/dL. Due to etoh abuse, liver may be cause or erratic hyperglycemia. May want to consider another increase in lantus to 15 units.  However, the safest method of glucose control at this point would be to use an IV insulin drip to help determine insulin needs once glucose levels are stable. Will follow.  Thank you Lenor CoffinAnn Kindsey Eblin, RN, MSN, CDE  Diabetes Inpatient Program Office: [phone] Pager: [phone] 8:00 am to 5:00 pm

## 2015-12-20 ENCOUNTER — Inpatient Hospital Stay (HOSPITAL_COMMUNITY): Payer: Medicaid Other

## 2015-12-20 DIAGNOSIS — F1410 Cocaine abuse, uncomplicated: Secondary | ICD-10-CM

## 2015-12-20 DIAGNOSIS — N179 Acute kidney failure, unspecified: Secondary | ICD-10-CM

## 2015-12-20 DIAGNOSIS — J9601 Acute respiratory failure with hypoxia: Secondary | ICD-10-CM

## 2015-12-20 DIAGNOSIS — D638 Anemia in other chronic diseases classified elsewhere: Secondary | ICD-10-CM

## 2015-12-20 DIAGNOSIS — J189 Pneumonia, unspecified organism: Principal | ICD-10-CM

## 2015-12-20 DIAGNOSIS — N183 Chronic kidney disease, stage 3 (moderate): Secondary | ICD-10-CM

## 2015-12-20 DIAGNOSIS — I10 Essential (primary) hypertension: Secondary | ICD-10-CM

## 2015-12-20 LAB — BASIC METABOLIC PANEL
ANION GAP: 10 (ref 5–15)
BUN: 57 mg/dL — ABNORMAL HIGH (ref 6–20)
CHLORIDE: 102 mmol/L (ref 101–111)
CO2: 22 mmol/L (ref 22–32)
CREATININE: 2.62 mg/dL — AB (ref 0.61–1.24)
Calcium: 8.2 mg/dL — ABNORMAL LOW (ref 8.9–10.3)
GFR calc non Af Amer: 25 mL/min — ABNORMAL LOW (ref >60)
GFR, EST AFRICAN AMERICAN: 28 mL/min — AB (ref >60)
Glucose, Bld: 231 mg/dL — ABNORMAL HIGH (ref 65–99)
POTASSIUM: 4.2 mmol/L (ref 3.5–5.1)
SODIUM: 134 mmol/L — AB (ref 135–145)

## 2015-12-20 LAB — CBC
HEMATOCRIT: 21 % — AB (ref 39.0–52.0)
HEMOGLOBIN: 6.9 g/dL — AB (ref 13.0–17.0)
MCH: 28.8 pg (ref 26.0–34.0)
MCHC: 32.9 g/dL (ref 30.0–36.0)
MCV: 87.5 fL (ref 78.0–100.0)
PLATELETS: 275 10*3/uL (ref 150–400)
RBC: 2.4 MIL/uL — AB (ref 4.22–5.81)
RDW: 14.6 % (ref 11.5–15.5)
WBC: 6 10*3/uL (ref 4.0–10.5)

## 2015-12-20 LAB — GLUCOSE, CAPILLARY
Glucose-Capillary: 210 mg/dL — ABNORMAL HIGH (ref 65–99)
Glucose-Capillary: 217 mg/dL — ABNORMAL HIGH (ref 65–99)
Glucose-Capillary: 174 mg/dL — ABNORMAL HIGH (ref 65–99)
Glucose-Capillary: 244 mg/dL — ABNORMAL HIGH (ref 65–99)

## 2015-12-20 LAB — ABO/RH: ABO/RH(D): B POS

## 2015-12-20 LAB — PROCALCITONIN: Procalcitonin: 0.53 ng/mL

## 2015-12-20 LAB — PREPARE RBC (CROSSMATCH)

## 2015-12-20 MED ORDER — SODIUM CHLORIDE 0.9 % IV SOLN
Freq: Once | INTRAVENOUS | Status: AC
  Administered 2015-12-20: 20:00:00 via INTRAVENOUS

## 2015-12-20 MED ORDER — FUROSEMIDE 10 MG/ML IJ SOLN
80.0000 mg | Freq: Two times a day (BID) | INTRAMUSCULAR | Status: DC
  Administered 2015-12-20 – 2015-12-22 (×3): 80 mg via INTRAVENOUS
  Filled 2015-12-20 (×4): qty 8

## 2015-12-20 MED ORDER — ENOXAPARIN SODIUM 30 MG/0.3ML ~~LOC~~ SOLN
30.0000 mg | SUBCUTANEOUS | Status: DC
  Administered 2015-12-21 (×2): 30 mg via SUBCUTANEOUS
  Filled 2015-12-20 (×2): qty 0.3

## 2015-12-20 MED ORDER — DIPHENHYDRAMINE HCL 50 MG/ML IJ SOLN
25.0000 mg | Freq: Once | INTRAMUSCULAR | Status: AC
  Administered 2015-12-21: 25 mg via INTRAVENOUS
  Filled 2015-12-20: qty 1

## 2015-12-20 MED ORDER — DEXTROSE 5 % IV SOLN
1.0000 g | INTRAVENOUS | Status: DC
  Administered 2015-12-20 – 2015-12-23 (×4): 1 g via INTRAVENOUS
  Filled 2015-12-20 (×5): qty 1

## 2015-12-20 MED ORDER — ACETAMINOPHEN 325 MG PO TABS
650.0000 mg | ORAL_TABLET | Freq: Once | ORAL | Status: AC
  Administered 2015-12-21: 650 mg via ORAL
  Filled 2015-12-20: qty 2

## 2015-12-20 MED ORDER — POTASSIUM CHLORIDE CRYS ER 20 MEQ PO TBCR
20.0000 meq | EXTENDED_RELEASE_TABLET | Freq: Two times a day (BID) | ORAL | Status: AC
  Administered 2015-12-20 – 2015-12-21 (×2): 20 meq via ORAL
  Filled 2015-12-20 (×2): qty 1

## 2015-12-20 MED ORDER — IPRATROPIUM-ALBUTEROL 0.5-2.5 (3) MG/3ML IN SOLN: 3.0000 mL | RESPIRATORY_TRACT | Status: DC | PRN

## 2015-12-20 NOTE — Progress Notes (Signed)
Pt states his breathing is fine and does not want tx's . RT will continue to monitor.

## 2015-12-20 NOTE — Procedures (Signed)
Central Venous Catheter Insertion Procedure Note Lyndel SafeVincent L Funnell [id number] 1951/05/03  Procedure: Insertion of Central Venous Catheter Indications: Assessment of intravascular volume, Drug and/or fluid administration and Frequent blood sampling  Procedure Details Consent: Risks of procedure as well as the alternatives and risks of each were explained to the (patient/caregiver).  Consent for procedure obtained. Time Out: Verified patient identification, verified procedure, site/side was marked, verified correct patient position, special equipment/implants available, medications/allergies/relevent history reviewed, required imaging and test results available.  Performed  Maximum sterile technique was used including antiseptics, cap, gloves, gown, hand hygiene, mask and sheet. Skin prep: Chlorhexidine; local anesthetic administered A antimicrobial bonded/coated triple lumen catheter was placed in the left internal jugular vein using the Seldinger technique.  Evaluation Blood flow good Complications: No apparent complications Patient did tolerate procedure well. Chest X-ray ordered to verify placement.  CXR: pending.  Procedure performed under direct ultrasound guidance for real time vessel cannulation.      Rutherford Guysahul Javonne Louissaint, [place] - [facility] Pager: [phone]  or [phone] 12/20/2015, 1:44 PM

## 2015-12-20 NOTE — Progress Notes (Signed)
CRITICAL VALUE ALERT  Critical value received:  hbg 6.9  Date of notification:  12/20/15  Time of notification:  2:46 PM  Critical value read back:Yes.    Nurse who received alert:  Theressa StampsKayla L Price  MD notified (1st page):  Dr. Jerral RalphGhimire  Time of first page:  2:47 PM  MD notified (2nd page):  Time of second page:  Responding MD:  Dr. Jerral RalphGhimire  Time MD responded:  2:50 PM

## 2015-12-20 NOTE — Progress Notes (Signed)
PATIENT DETAILS Name: Clayton [name] Age: 65 y.o. Sex: male Date of Birth: May 05, 1951 Admit Date: 12/16/2015 Admitting Physician Meredeth Ide, MD [id number] NOT IN SYSTEM  Subjective: Lying comfortably in bed.  Assessment/Plan: Principal Problem: Acute hypoxic respiratory failure: Secondary to acute CHF/pneumonia. Continue to titrate off oxygen, start Lasix from today, resume cefepime.   Active Problems: Probable acute diastolic heart failure: BNP elevated, worsening renal function-chest x-ray showing bilateral infiltrates-start IV Lasix, follow clinical course. Follow lytes closely. Recheck echo.  ?HCAP: Refused PICC line placement over the past few days-now has a central line-resume cefepime. Blood cultures continue to be negative. Follow procalcitonin levels.  Acute renal failure superimposed on stage 3 chronic kidney disease: Mild volume overload on exam-start Lasix, follow electrolytes closely. Suspect underlying diabetic nephropathy at baseline.   Type 2 diabetes: CBGs to be stable, continue 14 units of Levemir and SSI. Follow CBGs and adjust accordingly.  Hypertension: Continue hydralazine and diltiazem, follow BP trend and adjust accordingly  Anemia: Likely secondary to chronic disease/acute illness. Follow hemoglobin, transfuse if any further significant drop. No overt evidence of GI bleeding    Prolonged QT: Avoid QT prolonging agents, follow potassium/magnesium closely. Continue to monitor in telemetry  Alcohol abuse: No signs of withdrawal, continue Ativan per protocol  Cocaine abuse: Counseled  Noncompliance to medications: Counseled  Homelessness: Social work evaluation prior to discharge  Disposition: Remain inpatient  Antimicrobial agents  See below  Anti-infectives    Start     Dose/Rate Route Frequency Ordered Stop   12/21/15 1200  levofloxacin (LEVAQUIN) tablet 500 mg  Status:  Discontinued     500 mg Oral Every 48 hours 12/19/15  1113 12/20/15 1401   12/17/15 1215  levofloxacin (LEVAQUIN) tablet 500 mg  Status:  Discontinued     500 mg Oral Daily 12/17/15 1210 12/19/15 1113   12/17/15 0200  piperacillin-tazobactam (ZOSYN) IVPB 3.375 g  Status:  Discontinued     3.375 g 12.5 mL/hr over 240 Minutes Intravenous Every 8 hours 12/16/15 1703 12/16/15 1849   12/16/15 1930  ceFEPIme (MAXIPIME) 1 g in dextrose 5 % 50 mL IVPB  Status:  Discontinued     1 g 100 mL/hr over 30 Minutes Intravenous 3 times per day 12/16/15 1828 12/17/15 1210   12/16/15 1800  vancomycin (VANCOCIN) IVPB 750 mg/150 ml premix  Status:  Discontinued     750 mg 150 mL/hr over 60 Minutes Intravenous Every 12 hours 12/16/15 1703 12/17/15 1210   12/16/15 1700  piperacillin-tazobactam (ZOSYN) IVPB 3.375 g  Status:  Discontinued     3.375 g 100 mL/hr over 30 Minutes Intravenous  Once 12/16/15 1657 12/16/15 1849      DVT Prophylaxis: Prophylactic Lovenox   Code Status: Full code   Family Communication None  Procedures: Left IJ-1/11  CONSULTS:  None  Time spent 30 minutes-Greater than 50% of this time was spent in counseling, explanation of diagnosis, planning of further management, and coordination of care.  MEDICATIONS: Scheduled Meds: . diltiazem  120 mg Oral Q12H  . enoxaparin (LOVENOX) injection  40 mg Subcutaneous Q24H  . ferrous sulfate  325 mg Oral Q breakfast  . folic acid  1 mg Oral Daily  . furosemide  80 mg Intravenous BID  . guaiFENesin  600 mg Oral BID  . hydrALAZINE  50 mg Oral TID  . insulin aspart  0-15 Units Subcutaneous TID WC  . insulin  aspart  0-5 Units Subcutaneous QHS  . insulin detemir  14 Units Subcutaneous Daily  . LORazepam  0-4 mg Oral Q12H  . multivitamin with minerals  1 tablet Oral Daily  . potassium chloride  20 mEq Oral BID  . thiamine  100 mg Oral Daily   Continuous Infusions:  PRN Meds:.ipratropium-albuterol    PHYSICAL EXAM: Vital signs in last 24 hours: Filed Vitals:   12/20/15 0557  12/20/15 0844 12/20/15 0900 12/20/15 1138  BP: 131/56  155/58 151/63  Pulse: 79  89 88  Temp: 98.2 F (36.8 C)   98.1 F (36.7 C)  TempSrc: Oral     Resp: 14   20  Height:      Weight:      SpO2: 97% 90%      Weight change:  Filed Weights   12/18/15 1000  Weight: 80.015 kg (176 lb 6.4 oz)   Body mass index is 24.61 kg/(m^2).   Gen Exam: Awake and alert with clear speech.  Neck: Supple,+ JVD.   Chest: Bibasilar rales CVS: S1 S2 Regular, no murmurs.  Abdomen: soft, BS +, non tender, non distended.  Extremities: + edema, lower extremities warm to touch. Neurologic: Non Focal.   Skin: No Rash.   Wounds: N/A.    Intake/Output from previous day:  Intake/Output Summary (Last 24 hours) at 12/20/15 1402 Last data filed at 12/19/15 1700  Gross per 24 hour  Intake    150 ml  Output      0 ml  Net    150 ml     LAB RESULTS: CBC  Recent Labs Lab 12/16/15 1622 12/16/15 1628 12/17/15 0545 12/19/15 0442  WBC 10.0  --  9.4 8.7  HGB 8.4* 9.9* 7.4* 7.3*  HCT 26.4* 29.0* 22.8* 22.2*  PLT 313  --  286 291  MCV 90.4  --  88.7 89.9  MCH 28.8  --  28.8 29.6  MCHC 31.8  --  32.5 32.9  RDW 14.3  --  14.0 14.8  LYMPHSABS 0.5*  --  1.0  --   MONOABS 0.7  --  0.7  --   EOSABS 0.0  --  0.2  --   BASOSABS 0.0  --  0.0  --     Chemistries   Recent Labs Lab 12/16/15 1622 12/16/15 1628 12/17/15 0545 12/19/15 0442  NA 135 136 137 136  K 4.4 4.3 3.9 4.1  CL 102 102 105 105  CO2 21*  --  23 23  GLUCOSE 615* 610* 272* 248*  BUN 35* 35* 37* 55*  CREATININE 1.93* 1.70* 2.07* 2.74*  CALCIUM 8.6*  --  8.3* 8.0*  MG 2.1  --   --   --     CBG:  Recent Labs Lab 12/19/15 1238 12/19/15 1643 12/19/15 2210 12/20/15 0815 12/20/15 1149  GLUCAP 243* 175* 192* 210* 217*    GFR Estimated Creatinine Clearance: 29.8 mL/min (by C-G formula based on Cr of 2.74).  Coagulation profile No results for input(s): INR, PROTIME in the last 168 hours.  Cardiac Enzymes  Recent  Labs Lab 12/16/15 1622  TROPONINI 0.07*    Invalid input(s): POCBNP No results for input(s): DDIMER in the last 72 hours. No results for input(s): HGBA1C in the last 72 hours. No results for input(s): CHOL, HDL, LDLCALC, TRIG, CHOLHDL, LDLDIRECT in the last 72 hours. No results for input(s): TSH, T4TOTAL, T3FREE, THYROIDAB in the last 72 hours.  Invalid input(s): FREET3 No results for input(s): VITAMINB12,  FOLATE, FERRITIN, TIBC, IRON, RETICCTPCT in the last 72 hours. No results for input(s): LIPASE, AMYLASE in the last 72 hours.  Urine Studies No results for input(s): UHGB, CRYS in the last 72 hours.  Invalid input(s): UACOL, UAPR, USPG, UPH, UTP, UGL, UKET, UBIL, UNIT, UROB, ULEU, UEPI, UWBC, URBC, UBAC, CAST, UCOM, BILUA  MICROBIOLOGY: Recent Results (from the past 240 hour(s))  Blood culture (routine x 2)     Status: None   Collection Time: 12/11/15  8:34 AM  Result Value Ref Range Status   Specimen Description BLOOD LEFT FOREARM  Final   Special Requests IN PEDIATRIC BOTTLE 3CC  Final   Culture NO GROWTH 5 DAYS  Final   Report Status 12/16/2015 FINAL  Final  Blood culture (routine x 2)     Status: None   Collection Time: 12/11/15  9:15 AM  Result Value Ref Range Status   Specimen Description BLOOD ARM LEFT UPPER  Final   Special Requests IN PEDIATRIC BOTTLE 3CC  Final   Culture NO GROWTH 5 DAYS  Final   Report Status 12/16/2015 FINAL  Final  Culture, blood (Routine X 2) w Reflex to ID Panel     Status: None (Preliminary result)   Collection Time: 12/16/15  5:32 PM  Result Value Ref Range Status   Specimen Description BLOOD RIGHT HAND  Final   Special Requests IN PEDIATRIC BOTTLE 3CC  Final   Culture NO GROWTH 4 DAYS  Final   Report Status PENDING  Incomplete  Culture, blood (Routine X 2) w Reflex to ID Panel     Status: None (Preliminary result)   Collection Time: 12/16/15  7:13 PM  Result Value Ref Range Status   Specimen Description BLOOD  Final   Special  Requests BOTTLES DRAWN AEROBIC ONLY 6CC  Final   Culture NO GROWTH 4 DAYS  Final   Report Status PENDING  Incomplete    RADIOLOGY STUDIES/RESULTS: Dg Chest 2 View  12/19/2015  CLINICAL DATA:  Shortness of breath, confusion EXAM: CHEST  2 VIEW COMPARISON:  12/16/2015 FINDINGS: Multifocal patchy opacities, suspicious for multifocal pneumonia, less likely moderate interstitial edema. No definite pleural effusions. No pneumothorax. Cardiomegaly. IMPRESSION: Multifocal patchy opacities, suspicious for multifocal pneumonia, grossly unchanged. Electronically Signed   By: Charline BillsSriyesh  Krishnan M.D.   On: 12/19/2015 11:14   Dg Chest 2 View  12/11/2015  CLINICAL DATA:  65 year old male with shortness of breath and cough for 3 weeks. EXAM: CHEST  2 VIEW COMPARISON:  12/06/2015 and prior exams FINDINGS: Mild cardiomegaly again noted. Very small bilateral pleural effusions have increased. Diffuse peribronchial thickening is again noted. New patchy right upper lobe opacities and unchanged patchy lower lobe opacities are noted and suspicious for bronchopneumonia. There is no evidence of pneumothorax. IMPRESSION: New patchy right upper lobe opacities and unchanged patchy lower lobe opacity suspicious for bronchopneumonia. Slightly increasing very small bilateral pleural effusions. Mild cardiomegaly. Electronically Signed   By: Harmon PierJeffrey  Hu M.D.   On: 12/11/2015 07:59   Dg Chest 2 View  12/06/2015  CLINICAL DATA:  Cough. EXAM: CHEST  2 VIEW COMPARISON:  12/04/2015 and 10/31/2015 FINDINGS: There is prominent peribronchial thickening. On the lateral view there is a small patchy infiltrate posteriorly and inferiorly. Heart size and pulmonary vascularity are normal. No osseous abnormality. IMPRESSION: Small patchy infiltrate at 1 of the lung bases posteriorly and inferiorly. Prominent bronchitic changes. Electronically Signed   By: Francene BoyersJames  Maxwell M.D.   On: 12/06/2015 14:39   Dg Chest 2 View  12/04/2015  CLINICAL DATA:   Acute onset of hyperglycemia. Dizziness, cough and congestion. Vomiting and diarrhea. Initial encounter. EXAM: CHEST  2 VIEW COMPARISON:  Chest radiograph performed 10/31/2015 FINDINGS: The lungs are well-aerated. Mild vascular congestion is noted. Peribronchial thickening is noted. Minimal bilateral opacities may reflect atelectasis or mild pneumonia, depending on the patient's symptoms. There is no evidence of pleural effusion or pneumothorax. The heart is mildly enlarged. No acute osseous abnormalities are seen. IMPRESSION: Mild vascular congestion and mild cardiomegaly. Peribronchial thickening noted. Minimal bilateral opacities may reflect atelectasis or possibly mild pneumonia, depending on the patient's symptoms. Electronically Signed   By: Roanna Raider M.D.   On: 12/04/2015 03:32   Ct Head Wo Contrast  12/18/2015  CLINICAL DATA:  Larey Seat and hit back of head today. Unable pupils, weakness. History of chronic kidney disease, hypertension, diabetes, migraine. EXAM: CT HEAD WITHOUT CONTRAST TECHNIQUE: Contiguous axial images were obtained from the base of the skull through the vertex without intravenous contrast. COMPARISON:  CT head October 31, 2015 FINDINGS: The ventricles and sulci are mildly prominent for age. No intraparenchymal hemorrhage, mass effect nor midline shift. Patchy supratentorial white matter hypodensities. No acute large vascular territory infarcts. No abnormal extra-axial fluid collections. Basal cisterns are patent. Moderate calcific atherosclerosis of the carotid siphons. No skull fracture. LEFT occipital scalp thickening was present on prior examination. The included ocular globes and orbital contents are non-suspicious. Old RIGHT medial orbital blowout fracture. Old nondisplaced bilateral nasal bone fractures. Maxillary mucosal retention cysts. Ethmoid mucosal thickening. IMPRESSION: Acute intracranial process. Mild global parenchymal brain volume loss for age. Mild chronic small  vessel ischemic disease. Electronically Signed   By: Awilda Metro M.D.   On: 12/18/2015 23:00   Dg Chest Portable 1 View  12/16/2015  CLINICAL DATA:  Hypertension.  Hyperglycemia. EXAM: PORTABLE CHEST 1 VIEW COMPARISON:  12/13/2015 FINDINGS: Heart size is mildly enlarged. The lung volumes are low. There are diffuse bilateral lung opacities which may reflect multifocal infection or diffuse pulmonary edema. When compared with previous exam the aeration to lungs have worsened in the interval. IMPRESSION: 1. Worsening bilateral lung opacities compatible with multifocal infection and/or edema. Electronically Signed   By: Signa Kell M.D.   On: 12/16/2015 16:28   Dg Chest Port 1 View  12/13/2015  CLINICAL DATA:  Healthcare associated pneumonia, diabetes, chronic CHF, chronic renal insufficiency, substance abuse, current smoker. EXAM: PORTABLE CHEST 1 VIEW COMPARISON:  PA and lateral chest x-ray of December 11, 2015 FINDINGS: The lungs are slightly less well inflated. There remain confluent interstitial and alveolar opacities bilaterally. These are more conspicuous today. The heart is top-normal in size. The pulmonary vascularity is prominent centrally but no definite cephalization of the vascular pattern is observed. The bony thorax exhibits no acute abnormality. IMPRESSION: Bilateral interstitial and alveolar pneumonia with chronic low-grade compensated CHF. The appearance of the chest has deteriorated somewhat since yesterday's study which may be in part due to the portable technique versus the PA and lateral technique. Electronically Signed   By: David  [place] M.D.   On: 12/13/2015 08:01    Jeoffrey Massed, MD  [facility] Pager:[phone]  If 7PM-7AM, please contact night-coverage [email or web address] Password [email or web address] 12/20/2015, 2:02 PM   LOS: 4 days

## 2015-12-20 NOTE — Progress Notes (Signed)
Spoke with primary RN and made her aware the PICC probably would not be inserted today.   She will make MD aware.

## 2015-12-20 NOTE — Progress Notes (Signed)
Inpatient Diabetes Program Recommendations  AACE/ADA: New Consensus Statement on Inpatient Glycemic Control (2015)  Target Ranges:  Prepandial:   less than 140 mg/dL      Peak postprandial:   less than 180 mg/dL (1-2 hours)      Critically ill patients:  140 - 180 mg/dL   Review of Glycemic Control:  Results for Clayton [name], Clayton [name] (MRN [id number]) as of 12/20/2015 15:47  Ref. Range 12/19/2015 12:38 12/19/2015 16:43 12/19/2015 22:10 12/20/2015 08:15 12/20/2015 11:49  Glucose-Capillary Latest Ref Range: 65-99 mg/dL 782243 (H) 956175 (H) 213192 (H) 210 (H) 217 (H)    Diabetes history: Type 2 diabetes Inpatient Diabetes Program Recommendations:   Attempted to talk to patient regarding diabetes management.  He asked me to sit by his bed.  He began telling me that he believed that God would heal him.  Then he gave me a list of people to look up in the "phone book" that he thought may give him a place to live.  When I asked if we could talk about his diabetes he stated not right now.  I offered to bring him a phone number and let him call and he states "no, I want you to do it".  Followed up with case management.  She states that the current plan is for him to go a SNF.  Discussed also with MD.  Note that patient did not make eye contact with me during conversation however he was polite.  Thanks, Beryl MeagerJenny Jakayden Cancio, RN, BC-ADM Inpatient Diabetes Coordinator Pager [phone] (8a-5p)

## 2015-12-20 NOTE — Progress Notes (Signed)
OT Cancellation Note  Patient Details Name: Clayton [name] MRN: [id number] DOB: [birth year]   Cancelled Treatment:    Reason Eval/Treat Not Completed: Pain limiting ability to participate. Pt reports his leg is bothering him and he does not want to work with therapy at this time "my leg is messed up". Patient yelling at therapist when therapist attempted to encourage him to work with her. This is the second refusal this week. Third refusal from patient, OT will be signing off. Thank you for the order.   [name] , MS, OTR/L, CLT Pager: [phone]  12/20/2015, 11:10 AM

## 2015-12-20 NOTE — Progress Notes (Signed)
ANTIBIOTIC CONSULT NOTE - INITIAL  Pharmacy Consult for Cefepime Indication: pneumonia  Allergies  Allergen Reactions  . Latex Rash    Patient Measurements: Height: 5\' 11"  (180.3 cm) Weight: 176 lb 6.4 oz (80.015 kg) IBW/kg (Calculated) : 75.3  Vital Signs: Temp: 98.1 F (36.7 C) (01/11 1138) Temp Source: Oral (01/11 0557) BP: 151/63 mmHg (01/11 1138) Pulse Rate: 88 (01/11 1138) Intake/Output from previous day: 01/10 0701 - 01/11 0700 In: 300 [P.O.:300] Out: -  Intake/Output from this shift:    Labs:  Recent Labs  12/19/15 0442  WBC 8.7  HGB 7.3*  PLT 291  CREATININE 2.74*   Estimated Creatinine Clearance: 29.8 mL/min (by C-G formula based on Cr of 2.74). No results for input(s): VANCOTROUGH, VANCOPEAK, VANCORANDOM, GENTTROUGH, GENTPEAK, GENTRANDOM, TOBRATROUGH, TOBRAPEAK, TOBRARND, AMIKACINPEAK, AMIKACINTROU, AMIKACIN in the last 72 hours.   Microbiology: Recent Results (from the past 720 hour(s))  Blood culture (routine x 2)     Status: None   Collection Time: 12/04/15  3:45 AM  Result Value Ref Range Status   Specimen Description BLOOD RIGHT HAND  Final   Special Requests IN PEDIATRIC BOTTLE 4CC  Final   Culture   Final    NO GROWTH 5 DAYS [facility]    Report Status 12/09/2015 FINAL  Final  Blood culture (routine x 2)     Status: None   Collection Time: 12/04/15  4:50 AM  Result Value Ref Range Status   Specimen Description BLOOD LEFT WRIST  Final   Special Requests BOTTLES DRAWN AEROBIC AND ANAEROBIC 5CC  Final   Culture   Final    NO GROWTH 5 DAYS [facility]    Report Status 12/09/2015 FINAL  Final  MRSA PCR Screening     Status: None   Collection Time: 12/07/15  7:17 AM  Result Value Ref Range Status   MRSA by PCR NEGATIVE NEGATIVE Final    Comment:        The GeneXpert MRSA Assay (FDA approved for NASAL specimens only), is one component of a comprehensive MRSA colonization surveillance program.  It is not intended to diagnose MRSA infection nor to guide or monitor treatment for MRSA infections.   Blood culture (routine x 2)     Status: None   Collection Time: 12/11/15  8:34 AM  Result Value Ref Range Status   Specimen Description BLOOD LEFT FOREARM  Final   Special Requests IN PEDIATRIC BOTTLE 3CC  Final   Culture NO GROWTH 5 DAYS  Final   Report Status 12/16/2015 FINAL  Final  Blood culture (routine x 2)     Status: None   Collection Time: 12/11/15  9:15 AM  Result Value Ref Range Status   Specimen Description BLOOD ARM LEFT UPPER  Final   Special Requests IN PEDIATRIC BOTTLE 3CC  Final   Culture NO GROWTH 5 DAYS  Final   Report Status 12/16/2015 FINAL  Final  Culture, blood (Routine X 2) w Reflex to ID Panel     Status: None (Preliminary result)   Collection Time: 12/16/15  5:32 PM  Result Value Ref Range Status   Specimen Description BLOOD RIGHT HAND  Final   Special Requests IN PEDIATRIC BOTTLE 3CC  Final   Culture NO GROWTH 4 DAYS  Final   Report Status PENDING  Incomplete  Culture, blood (Routine X 2) w Reflex to ID Panel     Status: None (Preliminary result)   Collection Time: 12/16/15  7:13 PM  Result Value Ref Range Status   Specimen Description BLOOD  Final   Special Requests BOTTLES DRAWN AEROBIC ONLY 6CC  Final   Culture NO GROWTH 4 DAYS  Final   Report Status PENDING  Incomplete    Medical History: Past Medical History  Diagnosis Date  . CKD (chronic kidney disease), stage III   . Anemia   . HTN (hypertension)   . Type II diabetes mellitus (HCC)   . DVT (deep venous thrombosis) (HCC)     LLE  . Pneumonia 12/06/2015  . GERD (gastroesophageal reflux disease)   . Migraine     "a few times/month" (12/06/2015)  . Depression     Medications:  Scheduled:  . diltiazem  120 mg Oral Q12H  . enoxaparin (LOVENOX) injection  40 mg Subcutaneous Q24H  . ferrous sulfate  325 mg Oral Q breakfast  . folic acid  1 mg Oral Daily  . furosemide  80 mg  Intravenous BID  . guaiFENesin  600 mg Oral BID  . hydrALAZINE  50 mg Oral TID  . insulin aspart  0-15 Units Subcutaneous TID WC  . insulin aspart  0-5 Units Subcutaneous QHS  . insulin detemir  14 Units Subcutaneous Daily  . LORazepam  0-4 mg Oral Q12H  . multivitamin with minerals  1 tablet Oral Daily  . potassium chloride  20 mEq Oral BID  . thiamine  100 mg Oral Daily   Infusions:   Assessment: 65 yo M restarting IV antibiotics for HCAP.  Pt was initially on PO Levaquin due to loss of IV access, now with plans for PICC placement and restart IV antibiotics.  Of note, patient has some baseline CKD.  SCr has trended up this admission.  CrCl currently ~ 28 ml/min.  Zosyn x 1 1/7 Vancomycin 1/7 >> 1/8 (lost IV) Levoflox PO 1/8 >> 1/11 Cefepime 1/11 >>  1/7 blood x 2 ngtd 1/2 blood x 2 neg F MSRA PCR neg  Goal of Therapy:  Eradication of Infection  Plan:  Cefepime 1gm IV q24h Change Lovenox to 30mg  SQ q24h for CrCl <30.  [employer], Pharm.D., BCPS Clinical Pharmacist Pager [phone] 12/20/2015 2:27 PM

## 2015-12-21 DIAGNOSIS — I5031 Acute diastolic (congestive) heart failure: Secondary | ICD-10-CM

## 2015-12-21 DIAGNOSIS — F1010 Alcohol abuse, uncomplicated: Secondary | ICD-10-CM

## 2015-12-21 LAB — CULTURE, BLOOD (ROUTINE X 2)
CULTURE: NO GROWTH
CULTURE: NO GROWTH

## 2015-12-21 LAB — CBC
HCT: 23 % — ABNORMAL LOW (ref 39.0–52.0)
HEMOGLOBIN: 7.6 g/dL — AB (ref 13.0–17.0)
MCH: 28.8 pg (ref 26.0–34.0)
MCHC: 33 g/dL (ref 30.0–36.0)
MCV: 87.1 fL (ref 78.0–100.0)
PLATELETS: 274 10*3/uL (ref 150–400)
RBC: 2.64 MIL/uL — AB (ref 4.22–5.81)
RDW: 14.3 % (ref 11.5–15.5)
WBC: 7.1 10*3/uL (ref 4.0–10.5)

## 2015-12-21 LAB — BASIC METABOLIC PANEL
ANION GAP: 9 (ref 5–15)
BUN: 60 mg/dL — ABNORMAL HIGH (ref 6–20)
CHLORIDE: 106 mmol/L (ref 101–111)
CO2: 22 mmol/L (ref 22–32)
Calcium: 8.1 mg/dL — ABNORMAL LOW (ref 8.9–10.3)
Creatinine, Ser: 2.56 mg/dL — ABNORMAL HIGH (ref 0.61–1.24)
GFR, EST AFRICAN AMERICAN: 29 mL/min — AB (ref >60)
GFR, EST NON AFRICAN AMERICAN: 25 mL/min — AB (ref >60)
Glucose, Bld: 168 mg/dL — ABNORMAL HIGH (ref 65–99)
POTASSIUM: 4.3 mmol/L (ref 3.5–5.1)
SODIUM: 137 mmol/L (ref 135–145)

## 2015-12-21 LAB — GLUCOSE, CAPILLARY
GLUCOSE-CAPILLARY: 200 mg/dL — AB (ref 65–99)
GLUCOSE-CAPILLARY: 206 mg/dL — AB (ref 65–99)
Glucose-Capillary: 169 mg/dL — ABNORMAL HIGH (ref 65–99)
Glucose-Capillary: 241 mg/dL — ABNORMAL HIGH (ref 65–99)
Glucose-Capillary: 254 mg/dL — ABNORMAL HIGH (ref 65–99)

## 2015-12-21 LAB — MAGNESIUM: MAGNESIUM: 2.3 mg/dL (ref 1.7–2.4)

## 2015-12-21 MED ORDER — SODIUM CHLORIDE 0.9 % IJ SOLN
10.0000 mL | INTRAMUSCULAR | Status: DC | PRN
  Administered 2015-12-21 – 2015-12-22 (×5): 10 mL
  Administered 2015-12-23 (×2): 30 mL
  Administered 2015-12-23: 10 mL
  Administered 2015-12-24: 30 mL
  Filled 2015-12-21 (×9): qty 40

## 2015-12-21 NOTE — Progress Notes (Signed)
Pt had 1 unit of blood. Bp was 129/89 prior to infusion, and it became 148/74 after the infusion. O2 sat went to mid 80's after the blood infusion. MD notified. We will continue to monitor.

## 2015-12-21 NOTE — Progress Notes (Signed)
OT Cancellation Note  Patient Details Name: Clayton [name] MRN: [id number] DOB: 08-Nov-1951   Cancelled Treatment:    Reason Eval/Treat Not Completed: Patient declined, no reason specified. Pt found supine in bed with tray over lap and some type of liquid spilled on floor. Pt demanded that therapist clean up "this mess". Environmentalist outside of room, notified her and she stated she would clean it up. Pt refused getting out of bed, pt stated he wanted therapist to put him in a wheelchair and push him around unit. Therapist educated pt on role of occupational therapist, to help him with self-care and becoming more independent. Pt continued to refuse. Pt commented, "You're not [name]", then pt unable to explain what that meant. Notified RN of refusal and OT signing off at this time secondary to 3 refusals (Tuesday, Wednesday, and Thursday). If patient is willing to work with therapies and OT is deemed appropriate please send text page to OT services: [phone] OR call office at [phone]. Thank you for the order.    Edwin CapPatricia Casper Pagliuca , MS, OTR/L, CLT Pager: [phone]  12/21/2015, 2:03 PM

## 2015-12-21 NOTE — Progress Notes (Signed)
PATIENT DETAILS Name: Clayton [name] Age: 65 y.o. Sex: male Date of Birth: Sep 08, 1951 Admit Date: 12/16/2015 Admitting Physician Meredeth Ide, MD RUE:[id number] NOT IN SYSTEM  Subjective: Sleeping comfortably in bed. Claims breathing is better.   Assessment/Plan: Principal Problem: Acute hypoxic respiratory failure: Secondary to acute CHF vs pneumonia. Continue IV Lasix and cefepime. to titrate off oxygen, IV start Lasix from today, resume cefepime.   Active Problems: Probable acute diastolic heart failure: Continue IV Lasix-urine output inaccurate-have discussed with RN today. Continue Lasix 80 mg twice a day, follow daily weights. Repeat chest x-ray tomorrow morning.  ?HCAP: Continue cefepime, blood cultures continue to be negative. HIV/influenza panel negative. Follow procalcitonin levels.  Acute renal failure superimposed on stage 3 chronic kidney disease: Creatinine down trending, continue intravenous Lasix, appears to be slightly volume overloaded on exam. Suspect underlying diabetic nephropathy at baseline.   Type 2 diabetes: CBGs to be stable, continue 14 units of Levemir and SSI. Follow CBGs and adjust accordingly.  Hypertension: Continue hydralazine and diltiazem, follow BP trend and adjust accordingly  Anemia: Likely secondary to chronic disease/acute illness. Transfused 1 unit of PRBC on 1/11, Follow hemoglobin. No overt evidence of GI bleeding   Prolonged QT: Avoid QT prolonging agents, follow potassium/magnesium closely. Continue to monitor in telemetry.Repeat EKG  Alcohol abuse: No signs of withdrawal, continue Ativan per protocol  Cocaine abuse: Counseled  Noncompliance to medications: Counseled  Homelessness: Social work evaluation prior to discharge  Disposition: Remain inpatient-requires several more days of hospitalization  Antimicrobial agents  See below  Anti-infectives    Start     Dose/Rate Route Frequency Ordered Stop   12/21/15  1200  levofloxacin (LEVAQUIN) tablet 500 mg  Status:  Discontinued     500 mg Oral Every 48 hours 12/19/15 1113 12/20/15 1401   12/20/15 1600  ceFEPIme (MAXIPIME) 1 g in dextrose 5 % 50 mL IVPB     1 g 100 mL/hr over 30 Minutes Intravenous Every 24 hours 12/20/15 1430     12/17/15 1215  levofloxacin (LEVAQUIN) tablet 500 mg  Status:  Discontinued     500 mg Oral Daily 12/17/15 1210 12/19/15 1113   12/17/15 0200  piperacillin-tazobactam (ZOSYN) IVPB 3.375 g  Status:  Discontinued     3.375 g 12.5 mL/hr over 240 Minutes Intravenous Every 8 hours 12/16/15 1703 12/16/15 1849   12/16/15 1930  ceFEPIme (MAXIPIME) 1 g in dextrose 5 % 50 mL IVPB  Status:  Discontinued     1 g 100 mL/hr over 30 Minutes Intravenous 3 times per day 12/16/15 1828 12/17/15 1210   12/16/15 1800  vancomycin (VANCOCIN) IVPB 750 mg/150 ml premix  Status:  Discontinued     750 mg 150 mL/hr over 60 Minutes Intravenous Every 12 hours 12/16/15 1703 12/17/15 1210   12/16/15 1700  piperacillin-tazobactam (ZOSYN) IVPB 3.375 g  Status:  Discontinued     3.375 g 100 mL/hr over 30 Minutes Intravenous  Once 12/16/15 1657 12/16/15 1849      DVT Prophylaxis: Prophylactic Lovenox   Code Status: Full code   Family Communication None  Procedures: Left IJ-1/11  CONSULTS:  None  Time spent 30 minutes-Greater than 50% of this time was spent in counseling, explanation of diagnosis, planning of further management, and coordination of care.  MEDICATIONS: Scheduled Meds: . ceFEPime (MAXIPIME) IV  1 g Intravenous Q24H  . diltiazem  120 mg Oral Q12H  .  enoxaparin (LOVENOX) injection  30 mg Subcutaneous Q24H  . ferrous sulfate  325 mg Oral Q breakfast  . folic acid  1 mg Oral Daily  . furosemide  80 mg Intravenous BID  . guaiFENesin  600 mg Oral BID  . hydrALAZINE  50 mg Oral TID  . insulin aspart  0-15 Units Subcutaneous TID WC  . insulin aspart  0-5 Units Subcutaneous QHS  . insulin detemir  14 Units Subcutaneous Daily    . LORazepam  0-4 mg Oral Q12H  . multivitamin with minerals  1 tablet Oral Daily  . thiamine  100 mg Oral Daily   Continuous Infusions:  PRN Meds:.ipratropium-albuterol, sodium chloride    PHYSICAL EXAM: Vital signs in last 24 hours: Filed Vitals:   12/21/15 0221 12/21/15 0514 12/21/15 0530 12/21/15 0912  BP: 145/88 156/75 148/74 131/56  Pulse: 85 85 86 83  Temp: 97.8 F (36.6 C) 98.3 F (36.8 C) 97.9 F (36.6 C)   TempSrc: Oral Oral Oral   Resp: 20 22 24    Height:      Weight:  90.855 kg (200 lb 4.8 oz)    SpO2: 92% 85% 84%     Weight change:  Filed Weights   12/18/15 1000 12/21/15 0514  Weight: 80.015 kg (176 lb 6.4 oz) 90.855 kg (200 lb 4.8 oz)   Body mass index is 27.95 kg/(m^2).   Gen Exam: Awake and alert with clear speech. Not in any distress Neck: Supple,+ JVD.   Chest: Bibasilar rales CVS: S1 S2 Regular, no murmurs.  Abdomen: soft, BS +, non tender, non distended.  Extremities: + edema, lower extremities warm to touch. Neurologic: Non Focal.   Skin: No Rash.   Wounds: N/A.    Intake/Output from previous day:  Intake/Output Summary (Last 24 hours) at 12/21/15 1118 Last data filed at 12/21/15 0505  Gross per 24 hour  Intake    365 ml  Output    500 ml  Net   -135 ml     LAB RESULTS: CBC  Recent Labs Lab 12/16/15 1622 12/16/15 1628 12/17/15 0545 12/19/15 0442 12/20/15 1330 12/21/15 0710  WBC 10.0  --  9.4 8.7 6.0 7.1  HGB 8.4* 9.9* 7.4* 7.3* 6.9* 7.6*  HCT 26.4* 29.0* 22.8* 22.2* 21.0* 23.0*  PLT 313  --  286 291 275 274  MCV 90.4  --  88.7 89.9 87.5 87.1  MCH 28.8  --  28.8 29.6 28.8 28.8  MCHC 31.8  --  32.5 32.9 32.9 33.0  RDW 14.3  --  14.0 14.8 14.6 14.3  LYMPHSABS 0.5*  --  1.0  --   --   --   MONOABS 0.7  --  0.7  --   --   --   EOSABS 0.0  --  0.2  --   --   --   BASOSABS 0.0  --  0.0  --   --   --     Chemistries   Recent Labs Lab 12/16/15 1622 12/16/15 1628 12/17/15 0545 12/19/15 0442 12/20/15 1330 12/21/15 0710   NA 135 136 137 136 134* 137  K 4.4 4.3 3.9 4.1 4.2 4.3  CL 102 102 105 105 102 106  CO2 21*  --  23 23 22 22   GLUCOSE 615* 610* 272* 248* 231* 168*  BUN 35* 35* 37* 55* 57* 60*  CREATININE 1.93* 1.70* 2.07* 2.74* 2.62* 2.56*  CALCIUM 8.6*  --  8.3* 8.0* 8.2* 8.1*  MG 2.1  --   --   --   --  2.3    CBG:  Recent Labs Lab 12/20/15 1149 12/20/15 1606 12/20/15 2133 12/21/15 0017 12/21/15 0814  GLUCAP 217* 174* 244* 200* 169*    GFR Estimated Creatinine Clearance: 34.5 mL/min (by C-G formula based on Cr of 2.56).  Coagulation profile No results for input(s): INR, PROTIME in the last 168 hours.  Cardiac Enzymes  Recent Labs Lab 12/16/15 1622  TROPONINI 0.07*    Invalid input(s): POCBNP No results for input(s): DDIMER in the last 72 hours. No results for input(s): HGBA1C in the last 72 hours. No results for input(s): CHOL, HDL, LDLCALC, TRIG, CHOLHDL, LDLDIRECT in the last 72 hours. No results for input(s): TSH, T4TOTAL, T3FREE, THYROIDAB in the last 72 hours.  Invalid input(s): FREET3 No results for input(s): VITAMINB12, FOLATE, FERRITIN, TIBC, IRON, RETICCTPCT in the last 72 hours. No results for input(s): LIPASE, AMYLASE in the last 72 hours.  Urine Studies No results for input(s): UHGB, CRYS in the last 72 hours.  Invalid input(s): UACOL, UAPR, USPG, UPH, UTP, UGL, UKET, UBIL, UNIT, UROB, ULEU, UEPI, UWBC, URBC, UBAC, CAST, UCOM, BILUA  MICROBIOLOGY: Recent Results (from the past 240 hour(s))  Culture, blood (Routine X 2) w Reflex to ID Panel     Status: None (Preliminary result)   Collection Time: 12/16/15  5:32 PM  Result Value Ref Range Status   Specimen Description BLOOD RIGHT HAND  Final   Special Requests IN PEDIATRIC BOTTLE 3CC  Final   Culture NO GROWTH 4 DAYS  Final   Report Status PENDING  Incomplete  Culture, blood (Routine X 2) w Reflex to ID Panel     Status: None (Preliminary result)   Collection Time: 12/16/15  7:13 PM  Result Value Ref  Range Status   Specimen Description BLOOD  Final   Special Requests BOTTLES DRAWN AEROBIC ONLY 6CC  Final   Culture NO GROWTH 4 DAYS  Final   Report Status PENDING  Incomplete    RADIOLOGY STUDIES/RESULTS: Dg Chest 2 View  12/19/2015  CLINICAL DATA:  Shortness of breath, confusion EXAM: CHEST  2 VIEW COMPARISON:  12/16/2015 FINDINGS: Multifocal patchy opacities, suspicious for multifocal pneumonia, less likely moderate interstitial edema. No definite pleural effusions. No pneumothorax. Cardiomegaly. IMPRESSION: Multifocal patchy opacities, suspicious for multifocal pneumonia, grossly unchanged. Electronically Signed   By: Charline Bills M.D.   On: 12/19/2015 11:14   Dg Chest 2 View  12/11/2015  CLINICAL DATA:  65 year old male with shortness of breath and cough for 3 weeks. EXAM: CHEST  2 VIEW COMPARISON:  12/06/2015 and prior exams FINDINGS: Mild cardiomegaly again noted. Very small bilateral pleural effusions have increased. Diffuse peribronchial thickening is again noted. New patchy right upper lobe opacities and unchanged patchy lower lobe opacities are noted and suspicious for bronchopneumonia. There is no evidence of pneumothorax. IMPRESSION: New patchy right upper lobe opacities and unchanged patchy lower lobe opacity suspicious for bronchopneumonia. Slightly increasing very small bilateral pleural effusions. Mild cardiomegaly. Electronically Signed   By: Harmon Pier M.D.   On: 12/11/2015 07:59   Dg Chest 2 View  12/06/2015  CLINICAL DATA:  Cough. EXAM: CHEST  2 VIEW COMPARISON:  12/04/2015 and 10/31/2015 FINDINGS: There is prominent peribronchial thickening. On the lateral view there is a small patchy infiltrate posteriorly and inferiorly. Heart size and pulmonary vascularity are normal. No osseous abnormality. IMPRESSION: Small patchy infiltrate at 1 of the lung bases posteriorly and inferiorly. Prominent bronchitic changes. Electronically Signed   By: Francene Boyers M.D.   On: 12/06/2015  14:39   Dg Chest 2 View  12/04/2015  CLINICAL DATA:  Acute onset of hyperglycemia. Dizziness, cough and congestion. Vomiting and diarrhea. Initial encounter. EXAM: CHEST  2 VIEW COMPARISON:  Chest radiograph performed 10/31/2015 FINDINGS: The lungs are well-aerated. Mild vascular congestion is noted. Peribronchial thickening is noted. Minimal bilateral opacities may reflect atelectasis or mild pneumonia, depending on the patient's symptoms. There is no evidence of pleural effusion or pneumothorax. The heart is mildly enlarged. No acute osseous abnormalities are seen. IMPRESSION: Mild vascular congestion and mild cardiomegaly. Peribronchial thickening noted. Minimal bilateral opacities may reflect atelectasis or possibly mild pneumonia, depending on the patient's symptoms. Electronically Signed   By: Roanna Raider M.D.   On: 12/04/2015 03:32   Ct Head Wo Contrast  12/18/2015  CLINICAL DATA:  Larey Seat and hit back of head today. Unable pupils, weakness. History of chronic kidney disease, hypertension, diabetes, migraine. EXAM: CT HEAD WITHOUT CONTRAST TECHNIQUE: Contiguous axial images were obtained from the base of the skull through the vertex without intravenous contrast. COMPARISON:  CT head October 31, 2015 FINDINGS: The ventricles and sulci are mildly prominent for age. No intraparenchymal hemorrhage, mass effect nor midline shift. Patchy supratentorial white matter hypodensities. No acute large vascular territory infarcts. No abnormal extra-axial fluid collections. Basal cisterns are patent. Moderate calcific atherosclerosis of the carotid siphons. No skull fracture. LEFT occipital scalp thickening was present on prior examination. The included ocular globes and orbital contents are non-suspicious. Old RIGHT medial orbital blowout fracture. Old nondisplaced bilateral nasal bone fractures. Maxillary mucosal retention cysts. Ethmoid mucosal thickening. IMPRESSION: Acute intracranial process. Mild global  parenchymal brain volume loss for age. Mild chronic small vessel ischemic disease. Electronically Signed   By: Awilda Metro M.D.   On: 12/18/2015 23:00   Dg Chest Portable 1 View  12/20/2015  CLINICAL DATA:  Central line placement EXAM: PORTABLE CHEST 1 VIEW COMPARISON:  12/19/2015 FINDINGS: Bilateral patchy interstitial and alveolar airspace opacities. No significant pleural effusion. No pneumothorax. Stable cardiomediastinal silhouette. Left jugular central venous catheter with the tip projecting over the cavoatrial junction. No acute osseous abnormality. IMPRESSION: 1. Left jugular central venous catheter with the tip projecting over the cavoatrial junction. No pneumothorax. 2. Bilateral patchy interstitial and alveolar airspace opacities which may reflect pulmonary edema versus multilobar pneumonia. Electronically Signed   By: Elige Ko   On: 12/20/2015 14:12   Dg Chest Portable 1 View  12/16/2015  CLINICAL DATA:  Hypertension.  Hyperglycemia. EXAM: PORTABLE CHEST 1 VIEW COMPARISON:  12/13/2015 FINDINGS: Heart size is mildly enlarged. The lung volumes are low. There are diffuse bilateral lung opacities which may reflect multifocal infection or diffuse pulmonary edema. When compared with previous exam the aeration to lungs have worsened in the interval. IMPRESSION: 1. Worsening bilateral lung opacities compatible with multifocal infection and/or edema. Electronically Signed   By: Signa Kell M.D.   On: 12/16/2015 16:28   Dg Chest Port 1 View  12/13/2015  CLINICAL DATA:  Healthcare associated pneumonia, diabetes, chronic CHF, chronic renal insufficiency, substance abuse, current smoker. EXAM: PORTABLE CHEST 1 VIEW COMPARISON:  PA and lateral chest x-ray of December 11, 2015 FINDINGS: The lungs are slightly less well inflated. There remain confluent interstitial and alveolar opacities bilaterally. These are more conspicuous today. The heart is top-normal in size. The pulmonary vascularity is  prominent centrally but no definite cephalization of the vascular pattern is observed. The bony thorax exhibits no acute abnormality. IMPRESSION: Bilateral interstitial and alveolar pneumonia with chronic low-grade compensated CHF.  The appearance of the chest has deteriorated somewhat since yesterday's study which may be in part due to the portable technique versus the PA and lateral technique. Electronically Signed   By: David  [place] M.D.   On: 12/13/2015 08:01    Jeoffrey Massed, MD  [facility] Pager:[phone]  If 7PM-7AM, please contact night-coverage [email or web address] Password [email or web address] 12/21/2015, 11:18 AM   LOS: 5 days

## 2015-12-21 NOTE — Progress Notes (Signed)
Pt verbally abusive to nurse, cursing at nurse. When nurse attempted to administer medications (Lasix & Insulin), pt smacked nurses hand and stated, "get the fuck out of here".

## 2015-12-22 ENCOUNTER — Inpatient Hospital Stay (HOSPITAL_COMMUNITY): Payer: Medicaid Other

## 2015-12-22 DIAGNOSIS — R0602 Shortness of breath: Secondary | ICD-10-CM

## 2015-12-22 LAB — CBC
HEMATOCRIT: 22.7 % — AB (ref 39.0–52.0)
HEMOGLOBIN: 7.5 g/dL — AB (ref 13.0–17.0)
MCH: 29.3 pg (ref 26.0–34.0)
MCHC: 33 g/dL (ref 30.0–36.0)
MCV: 88.7 fL (ref 78.0–100.0)
Platelets: 280 10*3/uL (ref 150–400)
RBC: 2.56 MIL/uL — ABNORMAL LOW (ref 4.22–5.81)
RDW: 14.7 % (ref 11.5–15.5)
WBC: 5.8 10*3/uL (ref 4.0–10.5)

## 2015-12-22 LAB — BASIC METABOLIC PANEL
Anion gap: 8 (ref 5–15)
BUN: 60 mg/dL — AB (ref 6–20)
CALCIUM: 7.9 mg/dL — AB (ref 8.9–10.3)
CHLORIDE: 102 mmol/L (ref 101–111)
CO2: 23 mmol/L (ref 22–32)
CREATININE: 2.54 mg/dL — AB (ref 0.61–1.24)
GFR calc Af Amer: 30 mL/min — ABNORMAL LOW (ref >60)
GFR, EST NON AFRICAN AMERICAN: 25 mL/min — AB (ref >60)
GLUCOSE: 302 mg/dL — AB (ref 65–99)
Potassium: 4.4 mmol/L (ref 3.5–5.1)
Sodium: 133 mmol/L — ABNORMAL LOW (ref 135–145)

## 2015-12-22 LAB — TYPE AND SCREEN
ABO/RH(D): B POS
Antibody Screen: NEGATIVE
UNIT DIVISION: 0

## 2015-12-22 LAB — PROCALCITONIN: Procalcitonin: 0.34 ng/mL

## 2015-12-22 LAB — GLUCOSE, CAPILLARY
GLUCOSE-CAPILLARY: 285 mg/dL — AB (ref 65–99)
GLUCOSE-CAPILLARY: 354 mg/dL — AB (ref 65–99)
GLUCOSE-CAPILLARY: 112 mg/dL — AB (ref 65–99)
Glucose-Capillary: 269 mg/dL — ABNORMAL HIGH (ref 65–99)

## 2015-12-22 MED ORDER — ENOXAPARIN SODIUM 40 MG/0.4ML ~~LOC~~ SOLN
40.0000 mg | SUBCUTANEOUS | Status: DC
  Administered 2015-12-22 – 2015-12-23 (×2): 40 mg via SUBCUTANEOUS
  Filled 2015-12-22 (×2): qty 0.4

## 2015-12-22 MED ORDER — INSULIN DETEMIR 100 UNIT/ML ~~LOC~~ SOLN
20.0000 [IU] | Freq: Every day | SUBCUTANEOUS | Status: DC
  Administered 2015-12-23: 20 [IU] via SUBCUTANEOUS
  Filled 2015-12-22: qty 0.2

## 2015-12-22 MED ORDER — FUROSEMIDE 10 MG/ML IJ SOLN
80.0000 mg | Freq: Three times a day (TID) | INTRAMUSCULAR | Status: DC
  Administered 2015-12-22 – 2015-12-24 (×6): 80 mg via INTRAVENOUS
  Filled 2015-12-22 (×6): qty 8

## 2015-12-22 NOTE — Progress Notes (Signed)
Inpatient Diabetes Program Recommendations  AACE/ADA: New Consensus Statement on Inpatient Glycemic Control (2015)  Target Ranges:  Prepandial:   less than 140 mg/dL      Peak postprandial:   less than 180 mg/dL (1-2 hours)      Critically ill patients:  140 - 180 mg/dL   Review of Glycemic Control:  Results for [name], [name] (MRN [id number]) as of 12/22/2015 14:03  Ref. Range 12/21/2015 11:47 12/21/2015 16:48 12/21/2015 23:37 12/22/2015 07:50 12/22/2015 12:09  Glucose-Capillary Latest Ref Range: 65-99 mg/dL 409206 (H) 811254 (H) 914241 (H) 285 (H) 269 (H)   Inpatient Diabetes Program Recommendations: Consider further increase of Lantus to 20 units daily.  Note that patient is sometimes refusing medications.  Thanks, Beryl MeagerJenny Haroon Shatto, RN, BC-ADM Inpatient Diabetes Coordinator Pager [phone] (8a-5p)

## 2015-12-22 NOTE — Progress Notes (Signed)
PATIENT DETAILS Name: Clayton [name] Age: 65 y.o. Sex: male Date of Birth: 04/16/1951 Admit Date: 12/16/2015 Admitting Physician Meredeth Ide, MD ZOX:[id number] NOT IN SYSTEM  Subjective:  Claims breathing is better.   Assessment/Plan: Principal Problem: Acute hypoxic respiratory failure: Secondary to acute CHF and possible pneumonia. Continue IV Lasix and cefepime. Titrate off oxygen  Active Problems: Probable acute diastolic heart failure: Continue IV Lasix-urine output I suspect is inaccurate-but weight has increased-therefore will increase Lasix 80 mg to TID.  ?HCAP: Continue cefepime, blood cultures continue to be negative. HIV/influenza panel negative. Follow procalcitonin levels.  Acute renal failure superimposed on stage 3 chronic kidney disease: Creatinine down trending, continue intravenous Lasix, appears to be slightly volume overloaded on exam. Suspect underlying diabetic nephropathy at baseline.   Type 2 diabetes: CBGs uncontrolled,increase Levemir to 20 units and SSI. Follow CBGs and adjust accordingly.  Hypertension: Continue hydralazine and diltiazem, follow BP trend and adjust accordingly  Anemia: Likely secondary to chronic disease/acute illness. Transfused 1 unit of PRBC on 1/11, Follow hemoglobin. No overt evidence of GI bleeding   Prolonged QT: Improved-Avoid QT prolonging agents, follow potassium/magnesium closely. Continue to monitor in telemetry.Repeat EKG in am  Alcohol abuse: No signs of withdrawal, continue Ativan per protocol  Cocaine abuse: Counseled  Noncompliance to medications: Counseled  Homelessness: Social work evaluation prior to discharge  Disposition: Remain inpatient-SNF in 1-2 days  Antimicrobial agents  See below  Anti-infectives    Start     Dose/Rate Route Frequency Ordered Stop   12/21/15 1200  levofloxacin (LEVAQUIN) tablet 500 mg  Status:  Discontinued     500 mg Oral Every 48 hours 12/19/15 1113 12/20/15  1401   12/20/15 1600  ceFEPIme (MAXIPIME) 1 g in dextrose 5 % 50 mL IVPB     1 g 100 mL/hr over 30 Minutes Intravenous Every 24 hours 12/20/15 1430     12/17/15 1215  levofloxacin (LEVAQUIN) tablet 500 mg  Status:  Discontinued     500 mg Oral Daily 12/17/15 1210 12/19/15 1113   12/17/15 0200  piperacillin-tazobactam (ZOSYN) IVPB 3.375 g  Status:  Discontinued     3.375 g 12.5 mL/hr over 240 Minutes Intravenous Every 8 hours 12/16/15 1703 12/16/15 1849   12/16/15 1930  ceFEPIme (MAXIPIME) 1 g in dextrose 5 % 50 mL IVPB  Status:  Discontinued     1 g 100 mL/hr over 30 Minutes Intravenous 3 times per day 12/16/15 1828 12/17/15 1210   12/16/15 1800  vancomycin (VANCOCIN) IVPB 750 mg/150 ml premix  Status:  Discontinued     750 mg 150 mL/hr over 60 Minutes Intravenous Every 12 hours 12/16/15 1703 12/17/15 1210   12/16/15 1700  piperacillin-tazobactam (ZOSYN) IVPB 3.375 g  Status:  Discontinued     3.375 g 100 mL/hr over 30 Minutes Intravenous  Once 12/16/15 1657 12/16/15 1849      DVT Prophylaxis: Prophylactic Lovenox   Code Status: Full code   Family Communication None  Procedures: Left IJ-1/11  CONSULTS:  None  Time spent 30 minutes-Greater than 50% of this time was spent in counseling, explanation of diagnosis, planning of further management, and coordination of care.  MEDICATIONS: Scheduled Meds: . ceFEPime (MAXIPIME) IV  1 g Intravenous Q24H  . diltiazem  120 mg Oral Q12H  . enoxaparin (LOVENOX) injection  40 mg Subcutaneous Q24H  . ferrous sulfate  325 mg Oral Q breakfast  .  folic acid  1 mg Oral Daily  . furosemide  80 mg Intravenous TID  . guaiFENesin  600 mg Oral BID  . hydrALAZINE  50 mg Oral TID  . insulin aspart  0-15 Units Subcutaneous TID WC  . insulin aspart  0-5 Units Subcutaneous QHS  . insulin detemir  14 Units Subcutaneous Daily  . multivitamin with minerals  1 tablet Oral Daily  . thiamine  100 mg Oral Daily   Continuous Infusions:  PRN  Meds:.ipratropium-albuterol, sodium chloride    PHYSICAL EXAM: Vital signs in last 24 hours: Filed Vitals:   12/21/15 2254 12/22/15 0500 12/22/15 0656 12/22/15 1421  BP: 159/78  155/78 157/67  Pulse: 82  81 81  Temp: 98.6 F (37 C)  98.6 F (37 C) 98.2 F (36.8 C)  TempSrc: Oral  Oral Oral  Resp: 20  20 16   Height:      Weight:  94.348 kg (208 lb)    SpO2: 93%  96% 94%    Weight change: 3.493 kg (7 lb 11.2 oz) Filed Weights   12/18/15 1000 12/21/15 0514 12/22/15 0500  Weight: 80.015 kg (176 lb 6.4 oz) 90.855 kg (200 lb 4.8 oz) 94.348 kg (208 lb)   Body mass index is 29.02 kg/(m^2).   Gen Exam: Awake and alert with clear speech. Not in any distress Neck: Supple Chest: Bibasilar rales CVS: S1 S2 Regular, no murmurs.  Abdomen: soft, BS +, non tender, non distended.  Extremities: trace edema, lower extremities warm to touch. Neurologic: Non Focal.   Skin: No Rash.   Wounds: N/A.    Intake/Output from previous day:  Intake/Output Summary (Last 24 hours) at 12/22/15 1437 Last data filed at 12/22/15 1422  Gross per 24 hour  Intake    640 ml  Output   2050 ml  Net  -1410 ml     LAB RESULTS: CBC  Recent Labs Lab 12/16/15 1622  12/17/15 0545 12/19/15 0442 12/20/15 1330 12/21/15 0710 12/22/15 0358  WBC 10.0  --  9.4 8.7 6.0 7.1 5.8  HGB 8.4*  < > 7.4* 7.3* 6.9* 7.6* 7.5*  HCT 26.4*  < > 22.8* 22.2* 21.0* 23.0* 22.7*  PLT 313  --  286 291 275 274 280  MCV 90.4  --  88.7 89.9 87.5 87.1 88.7  MCH 28.8  --  28.8 29.6 28.8 28.8 29.3  MCHC 31.8  --  32.5 32.9 32.9 33.0 33.0  RDW 14.3  --  14.0 14.8 14.6 14.3 14.7  LYMPHSABS 0.5*  --  1.0  --   --   --   --   MONOABS 0.7  --  0.7  --   --   --   --   EOSABS 0.0  --  0.2  --   --   --   --   BASOSABS 0.0  --  0.0  --   --   --   --   < > = values in this interval not displayed.  Chemistries   Recent Labs Lab 12/16/15 1622  12/17/15 0545 12/19/15 0442 12/20/15 1330 12/21/15 0710 12/22/15 0358  NA 135  <  > 137 136 134* 137 133*  K 4.4  < > 3.9 4.1 4.2 4.3 4.4  CL 102  < > 105 105 102 106 102  CO2 21*  --  23 23 22 22 23   GLUCOSE 615*  < > 272* 248* 231* 168* 302*  BUN 35*  < > 37* 55* 57*  60* 60*  CREATININE 1.93*  < > 2.07* 2.74* 2.62* 2.56* 2.54*  CALCIUM 8.6*  --  8.3* 8.0* 8.2* 8.1* 7.9*  MG 2.1  --   --   --   --  2.3  --   < > = values in this interval not displayed.  CBG:  Recent Labs Lab 12/21/15 1147 12/21/15 1648 12/21/15 2337 12/22/15 0750 12/22/15 1209  GLUCAP 206* 254* 241* 285* 269*    GFR Estimated Creatinine Clearance: 35.4 mL/min (by C-G formula based on Cr of 2.54).  Coagulation profile No results for input(s): INR, PROTIME in the last 168 hours.  Cardiac Enzymes  Recent Labs Lab 12/16/15 1622  TROPONINI 0.07*    Invalid input(s): POCBNP No results for input(s): DDIMER in the last 72 hours. No results for input(s): HGBA1C in the last 72 hours. No results for input(s): CHOL, HDL, LDLCALC, TRIG, CHOLHDL, LDLDIRECT in the last 72 hours. No results for input(s): TSH, T4TOTAL, T3FREE, THYROIDAB in the last 72 hours.  Invalid input(s): FREET3 No results for input(s): VITAMINB12, FOLATE, FERRITIN, TIBC, IRON, RETICCTPCT in the last 72 hours. No results for input(s): LIPASE, AMYLASE in the last 72 hours.  Urine Studies No results for input(s): UHGB, CRYS in the last 72 hours.  Invalid input(s): UACOL, UAPR, USPG, UPH, UTP, UGL, UKET, UBIL, UNIT, UROB, ULEU, UEPI, UWBC, URBC, UBAC, CAST, UCOM, BILUA  MICROBIOLOGY: Recent Results (from the past 240 hour(s))  Culture, blood (Routine X 2) w Reflex to ID Panel     Status: None   Collection Time: 12/16/15  5:32 PM  Result Value Ref Range Status   Specimen Description BLOOD RIGHT HAND  Final   Special Requests IN PEDIATRIC BOTTLE 3CC  Final   Culture NO GROWTH 5 DAYS  Final   Report Status 12/21/2015 FINAL  Final  Culture, blood (Routine X 2) w Reflex to ID Panel     Status: None   Collection Time:  12/16/15  7:13 PM  Result Value Ref Range Status   Specimen Description BLOOD  Final   Special Requests BOTTLES DRAWN AEROBIC ONLY 6CC  Final   Culture NO GROWTH 5 DAYS  Final   Report Status 12/21/2015 FINAL  Final    RADIOLOGY STUDIES/RESULTS: Dg Chest 2 View  12/19/2015  CLINICAL DATA:  Shortness of breath, confusion EXAM: CHEST  2 VIEW COMPARISON:  12/16/2015 FINDINGS: Multifocal patchy opacities, suspicious for multifocal pneumonia, less likely moderate interstitial edema. No definite pleural effusions. No pneumothorax. Cardiomegaly. IMPRESSION: Multifocal patchy opacities, suspicious for multifocal pneumonia, grossly unchanged. Electronically Signed   By: Charline Bills M.D.   On: 12/19/2015 11:14   Dg Chest 2 View  12/11/2015  CLINICAL DATA:  65 year old male with shortness of breath and cough for 3 weeks. EXAM: CHEST  2 VIEW COMPARISON:  12/06/2015 and prior exams FINDINGS: Mild cardiomegaly again noted. Very small bilateral pleural effusions have increased. Diffuse peribronchial thickening is again noted. New patchy right upper lobe opacities and unchanged patchy lower lobe opacities are noted and suspicious for bronchopneumonia. There is no evidence of pneumothorax. IMPRESSION: New patchy right upper lobe opacities and unchanged patchy lower lobe opacity suspicious for bronchopneumonia. Slightly increasing very small bilateral pleural effusions. Mild cardiomegaly. Electronically Signed   By: Harmon Pier M.D.   On: 12/11/2015 07:59   Dg Chest 2 View  12/06/2015  CLINICAL DATA:  Cough. EXAM: CHEST  2 VIEW COMPARISON:  12/04/2015 and 10/31/2015 FINDINGS: There is prominent peribronchial thickening. On the lateral view  there is a small patchy infiltrate posteriorly and inferiorly. Heart size and pulmonary vascularity are normal. No osseous abnormality. IMPRESSION: Small patchy infiltrate at 1 of the lung bases posteriorly and inferiorly. Prominent bronchitic changes. Electronically Signed    By: Francene Boyers M.D.   On: 12/06/2015 14:39   Dg Chest 2 View  12/04/2015  CLINICAL DATA:  Acute onset of hyperglycemia. Dizziness, cough and congestion. Vomiting and diarrhea. Initial encounter. EXAM: CHEST  2 VIEW COMPARISON:  Chest radiograph performed 10/31/2015 FINDINGS: The lungs are well-aerated. Mild vascular congestion is noted. Peribronchial thickening is noted. Minimal bilateral opacities may reflect atelectasis or mild pneumonia, depending on the patient's symptoms. There is no evidence of pleural effusion or pneumothorax. The heart is mildly enlarged. No acute osseous abnormalities are seen. IMPRESSION: Mild vascular congestion and mild cardiomegaly. Peribronchial thickening noted. Minimal bilateral opacities may reflect atelectasis or possibly mild pneumonia, depending on the patient's symptoms. Electronically Signed   By: Roanna Raider M.D.   On: 12/04/2015 03:32   Ct Head Wo Contrast  12/18/2015  CLINICAL DATA:  Larey Seat and hit back of head today. Unable pupils, weakness. History of chronic kidney disease, hypertension, diabetes, migraine. EXAM: CT HEAD WITHOUT CONTRAST TECHNIQUE: Contiguous axial images were obtained from the base of the skull through the vertex without intravenous contrast. COMPARISON:  CT head October 31, 2015 FINDINGS: The ventricles and sulci are mildly prominent for age. No intraparenchymal hemorrhage, mass effect nor midline shift. Patchy supratentorial white matter hypodensities. No acute large vascular territory infarcts. No abnormal extra-axial fluid collections. Basal cisterns are patent. Moderate calcific atherosclerosis of the carotid siphons. No skull fracture. LEFT occipital scalp thickening was present on prior examination. The included ocular globes and orbital contents are non-suspicious. Old RIGHT medial orbital blowout fracture. Old nondisplaced bilateral nasal bone fractures. Maxillary mucosal retention cysts. Ethmoid mucosal thickening. IMPRESSION: Acute  intracranial process. Mild global parenchymal brain volume loss for age. Mild chronic small vessel ischemic disease. Electronically Signed   By: Awilda Metro M.D.   On: 12/18/2015 23:00   Dg Chest Port 1 View  12/22/2015  CLINICAL DATA:  Chronic CHF, acute respiratory failure with healthcare associated pneumonia, acute and chronic renal failure, diabetes EXAM: PORTABLE CHEST 1 VIEW COMPARISON:  Portable chest x-ray of December 20, 2015 FINDINGS: The lungs are well-expanded. Confluent alveolar opacities persist but have become less conspicuous. The cardiac silhouette remains enlarged. The pulmonary vascularity remains engorged. There is no pleural effusion. The left internal jugular venous catheter tip is stable projecting over the junction of the middle and distal thirds of the SVC. IMPRESSION: Slight interval improvement in the bilateral alveolar infiltrates consistent with improving pneumonia and CHF. Electronically Signed   By: David  [place] M.D.   On: 12/22/2015 08:08   Dg Chest Portable 1 View  12/20/2015  CLINICAL DATA:  Central line placement EXAM: PORTABLE CHEST 1 VIEW COMPARISON:  12/19/2015 FINDINGS: Bilateral patchy interstitial and alveolar airspace opacities. No significant pleural effusion. No pneumothorax. Stable cardiomediastinal silhouette. Left jugular central venous catheter with the tip projecting over the cavoatrial junction. No acute osseous abnormality. IMPRESSION: 1. Left jugular central venous catheter with the tip projecting over the cavoatrial junction. No pneumothorax. 2. Bilateral patchy interstitial and alveolar airspace opacities which may reflect pulmonary edema versus multilobar pneumonia. Electronically Signed   By: Elige Ko   On: 12/20/2015 14:12   Dg Chest Portable 1 View  12/16/2015  CLINICAL DATA:  Hypertension.  Hyperglycemia. EXAM: PORTABLE CHEST 1 VIEW COMPARISON:  12/13/2015 FINDINGS: Heart size is mildly enlarged. The lung volumes are low. There are diffuse  bilateral lung opacities which may reflect multifocal infection or diffuse pulmonary edema. When compared with previous exam the aeration to lungs have worsened in the interval. IMPRESSION: 1. Worsening bilateral lung opacities compatible with multifocal infection and/or edema. Electronically Signed   By: Signa Kellaylor  Stroud M.D.   On: 12/16/2015 16:28   Dg Chest Port 1 View  12/13/2015  CLINICAL DATA:  Healthcare associated pneumonia, diabetes, chronic CHF, chronic renal insufficiency, substance abuse, current smoker. EXAM: PORTABLE CHEST 1 VIEW COMPARISON:  PA and lateral chest x-ray of December 11, 2015 FINDINGS: The lungs are slightly less well inflated. There remain confluent interstitial and alveolar opacities bilaterally. These are more conspicuous today. The heart is top-normal in size. The pulmonary vascularity is prominent centrally but no definite cephalization of the vascular pattern is observed. The bony thorax exhibits no acute abnormality. IMPRESSION: Bilateral interstitial and alveolar pneumonia with chronic low-grade compensated CHF. The appearance of the chest has deteriorated somewhat since yesterday's study which may be in part due to the portable technique versus the PA and lateral technique. Electronically Signed   By: David  [place] M.D.   On: 12/13/2015 08:01    Jeoffrey MassedGHIMIRE,Teagen Bucio, MD  [facility] Pager:[phone]  If 7PM-7AM, please contact night-coverage [email or web address] Password [email or web address] 12/22/2015, 2:37 PM   LOS: 6 days

## 2015-12-22 NOTE — Progress Notes (Signed)
*  PRELIMINARY RESULTS* Echocardiogram 2D Echocardiogram has been performed.  Jeryl Columbialliott, Willette Mudry 12/22/2015, 11:08 AM

## 2015-12-22 NOTE — Progress Notes (Signed)
Physical Therapy Treatment Patient Details Name: LADELL LEA MRN: [id number] DOB: [birth year] Today's Date: 12/22/2015    History of Present Illness 65 y.o. male with Hx of CKD, anemia, HTN, DM II, DVT, and depression. Admitted with principle problems of HCAP / Acute respiratory failure with hypoxia,Type 2 diabetes mellitus with hyperosmolar nonketotic hyperglycemia , chronic diastolic CHF, and  Noncompliance with medications/therapies    PT Comments    Self-limiting today, unwilling to ambulate but agreeable to get OOB to recliner for lunch. Required min assist for sit to stand and pivot transfer with heavy use of rolling walker for support and poor balance demonstrated. Refused therapeutic exercises. Patient will continue to benefit from skilled physical therapy services to further improve independence with functional mobility.   Follow Up Recommendations  SNF;Supervision/Assistance - 24 hour     Equipment Recommendations  None recommended by PT (to be determined at next level of care)    Recommendations for Other Services       Precautions / Restrictions Precautions Precautions: Fall Precaution Comments: Monitor O2 Restrictions Weight Bearing Restrictions: No    Mobility  Bed Mobility Overal bed mobility: Needs Assistance Bed Mobility: Supine to Sit     Supine to sit: Min assist;Min guard;Mod assist     General bed mobility comments: min guard for safety. VC for sequencing and technique.  Transfers Overall transfer level: Needs assistance Equipment used: Rolling walker (2 wheeled) Transfers: Sit to/from [employer] Sit to Stand: Min assist Stand pivot transfers: Min assist       General transfer comment: Min assist for boost and balance upon standing with heavy use of RW for support. VC for hand placement and min assist with walker control and placement during pivot to recliner. Declined to ambulate.   Ambulation/Gait              General Gait Details: refused despite multiple attempts.   Stairs            Wheelchair Mobility    Modified Rankin (Stroke Patients Only)       Balance                                    Cognition Arousal/Alertness: Awake/alert Behavior During Therapy: Agitated Overall Cognitive Status: No family/caregiver present to determine baseline cognitive functioning Area of Impairment: Safety/judgement;Orientation;Problem solving;Memory Orientation Level: Disoriented to;Time   Memory: Decreased short-term memory   Safety/Judgement: Decreased awareness of safety   Problem Solving: Slow processing;Requires verbal cues      Exercises      General Comments General comments (skin integrity, edema, etc.): SpO2 91% on room air      Pertinent Vitals/Pain Pain Assessment:  (no value given)    Home Living                      Prior Function            PT Goals (current goals can now be found in the care plan section) Acute Rehab PT Goals Patient Stated Goal: watch TV PT Goal Formulation: With patient Time For Goal Achievement: 12/31/15 Potential to Achieve Goals: Fair Progress towards PT goals: Not progressing toward goals - comment    Frequency  Min 2X/week    PT Plan Current plan remains appropriate    Co-evaluation             End of Session Equipment Utilized  During Treatment: Gait belt Activity Tolerance:  (Self-limiting) Patient left: with call bell/phone within reach;in chair;with chair alarm set     Time: [id number] PT Time Calculation (min) (ACUTE ONLY): 22 min  Charges:  $Therapeutic Activity: 8-22 mins                    G Codes:      [name], Macyn Shropshire S 12/22/2015, 12:49 PM  Sunday SpillersLogan Secor [place], [place]PT [phone]

## 2015-12-23 LAB — CBC
HEMATOCRIT: 24.2 % — AB (ref 39.0–52.0)
Hemoglobin: 8 g/dL — ABNORMAL LOW (ref 13.0–17.0)
MCH: 29.3 pg (ref 26.0–34.0)
MCHC: 33.1 g/dL (ref 30.0–36.0)
MCV: 88.6 fL (ref 78.0–100.0)
Platelets: 292 10*3/uL (ref 150–400)
RBC: 2.73 MIL/uL — ABNORMAL LOW (ref 4.22–5.81)
RDW: 14.7 % (ref 11.5–15.5)
WBC: 6 10*3/uL (ref 4.0–10.5)

## 2015-12-23 LAB — BASIC METABOLIC PANEL
ANION GAP: 8 (ref 5–15)
BUN: 58 mg/dL — ABNORMAL HIGH (ref 6–20)
CALCIUM: 8 mg/dL — AB (ref 8.9–10.3)
CHLORIDE: 103 mmol/L (ref 101–111)
CO2: 25 mmol/L (ref 22–32)
Creatinine, Ser: 2.53 mg/dL — ABNORMAL HIGH (ref 0.61–1.24)
GFR calc Af Amer: 30 mL/min — ABNORMAL LOW (ref >60)
GFR calc non Af Amer: 26 mL/min — ABNORMAL LOW (ref >60)
GLUCOSE: 231 mg/dL — AB (ref 65–99)
Potassium: 4.6 mmol/L (ref 3.5–5.1)
Sodium: 136 mmol/L (ref 135–145)

## 2015-12-23 LAB — GLUCOSE, CAPILLARY
GLUCOSE-CAPILLARY: 279 mg/dL — AB (ref 65–99)
GLUCOSE-CAPILLARY: 396 mg/dL — AB (ref 65–99)
GLUCOSE-CAPILLARY: 147 mg/dL — AB (ref 65–99)
Glucose-Capillary: 208 mg/dL — ABNORMAL HIGH (ref 65–99)

## 2015-12-23 LAB — MAGNESIUM: Magnesium: 2 mg/dL (ref 1.7–2.4)

## 2015-12-23 MED ORDER — INSULIN DETEMIR 100 UNIT/ML ~~LOC~~ SOLN
25.0000 [IU] | Freq: Every day | SUBCUTANEOUS | Status: DC
  Administered 2015-12-24: 25 [IU] via SUBCUTANEOUS
  Filled 2015-12-23: qty 0.25

## 2015-12-23 NOTE — Care Management Note (Signed)
Case Management Note  Patient Details  Name: Clayton [name] MRN: [id number] Date of Birth: 10/11/1951  Subjective/Objective:  65 yo M with h of CKD, anemia, HTN, DM II, DVT, and depression. Admitted with HCAP / Acute respiratory failure with hypoxia,Type 2 DM with hyperosmolar nonketotic hyperglycemia , chronic diastolic CHF, and  noncompliance with medications/therapies.                  Action/Plan: PT is recommending SNF   Expected Discharge Date:   12/25/15               Expected Discharge Plan:  Skilled Nursing Facility  In-House Referral:  Clinical Social Work  Discharge planning Services  CM Consult  Post Acute Care Choice:    Choice offered to:     DME Arranged:    DME Agency:     HH Arranged:    [place] Agency:     Status of Service:  In process, will continue to follow  Medicare Important Message Given:    Date Medicare IM Given:    Medicare IM give by:    Date Additional Medicare IM Given:    Additional Medicare Important Message give by:     If discussed at [place] of Stay Meetings, dates discussed:    Additional Comments: met with pt at bedside to discuss PT recommendations. He stated that he needs to go to [place]. He is currently renting a room at a friend's home. Explained PT recommendations and that he needs 24 hr supervision and therapy. Explained SNF. He agreed with SNF before going to [place]. Informed him that the SW will assist him with SNF placement.   Norina Buzzard, RN 12/23/2015, 9:36 AM

## 2015-12-23 NOTE — Progress Notes (Signed)
PATIENT DETAILS Name: Clayton [name] Age: 65 y.o. Sex: male Date of Birth: 10-01-1951 Admit Date: 12/16/2015 Admitting Physician Meredeth Ide, MD [id number] NOT IN SYSTEM  Subjective: Improving rapidly-breathing is much better.   Assessment/Plan: Principal Problem: Acute hypoxic respiratory failure: Secondary to acute CHF and possible pneumonia. Continue IV Lasix and cefepime. Titrated off oxygen this am  Active Problems: Acute diastolic heart failure: Continue IV Lasix,2.9 urine output over past 24 hours.Suspect we can transition to oral diuretics tomorrow  ?HCAP: Continue cefepime, blood cultures continue to be negative. HIV/influenza panel negative. Follow procalcitonin levels.  Acute renal failure superimposed on stage 3 chronic kidney disease: Creatinine down trending, continue intravenous Lasix, appears to be slightly volume overloaded on exam. Suspect underlying diabetic nephropathy at baseline.   Type 2 diabetes: CBGs uncontrolled,increase Levemir to 25 units and SSI. Follow CBGs and adjust accordingly.  Hypertension: Continue hydralazine and diltiazem, follow BP trend and adjust accordingly  Anemia: Likely secondary to chronic disease/acute illness. Transfused 1 unit of PRBC on 1/11, Follow hemoglobin. No overt evidence of GI bleeding   Prolonged QT: Resolved  Alcohol abuse: No signs of withdrawal, continue Ativan per protocol  Cocaine abuse: Counseled  Noncompliance to medications: Counseled  Homelessness: Social work evaluation prior to discharge  Disposition: Remain inpatient-SNF tomorrow if continues to improve  Antimicrobial agents  See below  Anti-infectives    Start     Dose/Rate Route Frequency Ordered Stop   12/21/15 1200  levofloxacin (LEVAQUIN) tablet 500 mg  Status:  Discontinued     500 mg Oral Every 48 hours 12/19/15 1113 12/20/15 1401   12/20/15 1600  ceFEPIme (MAXIPIME) 1 g in dextrose 5 % 50 mL IVPB     1 g 100 mL/hr  over 30 Minutes Intravenous Every 24 hours 12/20/15 1430     12/17/15 1215  levofloxacin (LEVAQUIN) tablet 500 mg  Status:  Discontinued     500 mg Oral Daily 12/17/15 1210 12/19/15 1113   12/17/15 0200  piperacillin-tazobactam (ZOSYN) IVPB 3.375 g  Status:  Discontinued     3.375 g 12.5 mL/hr over 240 Minutes Intravenous Every 8 hours 12/16/15 1703 12/16/15 1849   12/16/15 1930  ceFEPIme (MAXIPIME) 1 g in dextrose 5 % 50 mL IVPB  Status:  Discontinued     1 g 100 mL/hr over 30 Minutes Intravenous 3 times per day 12/16/15 1828 12/17/15 1210   12/16/15 1800  vancomycin (VANCOCIN) IVPB 750 mg/150 ml premix  Status:  Discontinued     750 mg 150 mL/hr over 60 Minutes Intravenous Every 12 hours 12/16/15 1703 12/17/15 1210   12/16/15 1700  piperacillin-tazobactam (ZOSYN) IVPB 3.375 g  Status:  Discontinued     3.375 g 100 mL/hr over 30 Minutes Intravenous  Once 12/16/15 1657 12/16/15 1849      DVT Prophylaxis: Prophylactic Lovenox   Code Status: Full code   Family Communication None  Procedures: Left IJ-1/11  CONSULTS:  None  Time spent 25 minutes-Greater than 50% of this time was spent in counseling, explanation of diagnosis, planning of further management, and coordination of care.  MEDICATIONS: Scheduled Meds: . ceFEPime (MAXIPIME) IV  1 g Intravenous Q24H  . diltiazem  120 mg Oral Q12H  . enoxaparin (LOVENOX) injection  40 mg Subcutaneous Q24H  . ferrous sulfate  325 mg Oral Q breakfast  . folic acid  1 mg Oral Daily  . furosemide  80 mg  Intravenous TID  . guaiFENesin  600 mg Oral BID  . hydrALAZINE  50 mg Oral TID  . insulin aspart  0-15 Units Subcutaneous TID WC  . insulin aspart  0-5 Units Subcutaneous QHS  . insulin detemir  20 Units Subcutaneous Daily  . multivitamin with minerals  1 tablet Oral Daily  . thiamine  100 mg Oral Daily   Continuous Infusions:  PRN Meds:.ipratropium-albuterol, sodium chloride    PHYSICAL EXAM: Vital signs in last 24  hours: Filed Vitals:   12/22/15 2132 12/23/15 0454 12/23/15 0600 12/23/15 0750  BP: 136/62 155/65    Pulse: 76 81    Temp: 98.4 F (36.9 C) 98.8 F (37.1 C)    TempSrc: Oral Oral    Resp: 20 19    Height:      Weight:  88.6 kg (195 lb 5.2 oz)    SpO2: 92% 90% 98% 96%    Weight change: -5.748 kg (-12 lb 10.8 oz) Filed Weights   12/21/15 0514 12/22/15 0500 12/23/15 0454  Weight: 90.855 kg (200 lb 4.8 oz) 94.348 kg (208 lb) 88.6 kg (195 lb 5.2 oz)   Body mass index is 27.25 kg/(m^2).   Gen Exam: Awake and alert with clear speech. Not in any distress Neck: Supple Chest:Clear to auscultation anteriorly CVS: S1 S2 Regular, no murmurs.  Abdomen: soft, BS +, non tender, non distended.  Extremities: trace edema, lower extremities warm to touch. Neurologic: Non Focal.   Skin: No Rash.   Wounds: N/A.    Intake/Output from previous day:  Intake/Output Summary (Last 24 hours) at 12/23/15 1329 Last data filed at 12/23/15 0508  Gross per 24 hour  Intake    640 ml  Output   1700 ml  Net  -1060 ml     LAB RESULTS: CBC  Recent Labs Lab 12/16/15 1622  12/17/15 0545 12/19/15 0442 12/20/15 1330 12/21/15 0710 12/22/15 0358 12/23/15 0511  WBC 10.0  --  9.4 8.7 6.0 7.1 5.8 6.0  HGB 8.4*  < > 7.4* 7.3* 6.9* 7.6* 7.5* 8.0*  HCT 26.4*  < > 22.8* 22.2* 21.0* 23.0* 22.7* 24.2*  PLT 313  --  286 291 275 274 280 292  MCV 90.4  --  88.7 89.9 87.5 87.1 88.7 88.6  MCH 28.8  --  28.8 29.6 28.8 28.8 29.3 29.3  MCHC 31.8  --  32.5 32.9 32.9 33.0 33.0 33.1  RDW 14.3  --  14.0 14.8 14.6 14.3 14.7 14.7  LYMPHSABS 0.5*  --  1.0  --   --   --   --   --   MONOABS 0.7  --  0.7  --   --   --   --   --   EOSABS 0.0  --  0.2  --   --   --   --   --   BASOSABS 0.0  --  0.0  --   --   --   --   --   < > = values in this interval not displayed.  Chemistries   Recent Labs Lab 12/16/15 1622  12/19/15 0442 12/20/15 1330 12/21/15 0710 12/22/15 0358 12/23/15 0511  NA 135  < > 136 134* 137  133* 136  K 4.4  < > 4.1 4.2 4.3 4.4 4.6  CL 102  < > 105 102 106 102 103  CO2 21*  < > GLUCOSE 615*  < > 248* 231* 168* 302* 231*  BUN 35*  < > 55* 57* 60* 60* 58*  CREATININE 1.93*  < > 2.74* 2.62* 2.56* 2.54* 2.53*  CALCIUM 8.6*  < > 8.0* 8.2* 8.1* 7.9* 8.0*  MG 2.1  --   --   --  2.3  --  2.0  < > = values in this interval not displayed.  CBG:  Recent Labs Lab 12/22/15 1209 12/22/15 1708 12/22/15 2130 12/23/15 0758 12/23/15 1150  GLUCAP 269* 354* 112* 208* 279*    GFR Estimated Creatinine Clearance: 32.2 mL/min (by C-G formula based on Cr of 2.53).  Coagulation profile No results for input(s): INR, PROTIME in the last 168 hours.  Cardiac Enzymes  Recent Labs Lab 12/16/15 1622  TROPONINI 0.07*    Invalid input(s): POCBNP No results for input(s): DDIMER in the last 72 hours. No results for input(s): HGBA1C in the last 72 hours. No results for input(s): CHOL, HDL, LDLCALC, TRIG, CHOLHDL, LDLDIRECT in the last 72 hours. No results for input(s): TSH, T4TOTAL, T3FREE, THYROIDAB in the last 72 hours.  Invalid input(s): FREET3 No results for input(s): VITAMINB12, FOLATE, FERRITIN, TIBC, IRON, RETICCTPCT in the last 72 hours. No results for input(s): LIPASE, AMYLASE in the last 72 hours.  Urine Studies No results for input(s): UHGB, CRYS in the last 72 hours.  Invalid input(s): UACOL, UAPR, USPG, UPH, UTP, UGL, UKET, UBIL, UNIT, UROB, ULEU, UEPI, UWBC, URBC, UBAC, CAST, UCOM, BILUA  MICROBIOLOGY: Recent Results (from the past 240 hour(s))  Culture, blood (Routine X 2) w Reflex to ID Panel     Status: None   Collection Time: 12/16/15  5:32 PM  Result Value Ref Range Status   Specimen Description BLOOD RIGHT HAND  Final   Special Requests IN PEDIATRIC BOTTLE 3CC  Final   Culture NO GROWTH 5 DAYS  Final   Report Status 12/21/2015 FINAL  Final  Culture, blood (Routine X 2) w Reflex to ID Panel     Status: None   Collection Time: 12/16/15  7:13 PM   Result Value Ref Range Status   Specimen Description BLOOD  Final   Special Requests BOTTLES DRAWN AEROBIC ONLY 6CC  Final   Culture NO GROWTH 5 DAYS  Final   Report Status 12/21/2015 FINAL  Final    RADIOLOGY STUDIES/RESULTS: Dg Chest 2 View  12/19/2015  CLINICAL DATA:  Shortness of breath, confusion EXAM: CHEST  2 VIEW COMPARISON:  12/16/2015 FINDINGS: Multifocal patchy opacities, suspicious for multifocal pneumonia, less likely moderate interstitial edema. No definite pleural effusions. No pneumothorax. Cardiomegaly. IMPRESSION: Multifocal patchy opacities, suspicious for multifocal pneumonia, grossly unchanged. Electronically Signed   By: Charline Bills M.D.   On: 12/19/2015 11:14   Dg Chest 2 View  12/11/2015  CLINICAL DATA:  65 year old male with shortness of breath and cough for 3 weeks. EXAM: CHEST  2 VIEW COMPARISON:  12/06/2015 and prior exams FINDINGS: Mild cardiomegaly again noted. Very small bilateral pleural effusions have increased. Diffuse peribronchial thickening is again noted. New patchy right upper lobe opacities and unchanged patchy lower lobe opacities are noted and suspicious for bronchopneumonia. There is no evidence of pneumothorax. IMPRESSION: New patchy right upper lobe opacities and unchanged patchy lower lobe opacity suspicious for bronchopneumonia. Slightly increasing very small bilateral pleural effusions. Mild cardiomegaly. Electronically Signed   By: Harmon Pier M.D.   On: 12/11/2015 07:59   Dg Chest 2 View  12/06/2015  CLINICAL DATA:  Cough. EXAM: CHEST  2 VIEW COMPARISON:  12/04/2015 and 10/31/2015 FINDINGS: There is prominent  peribronchial thickening. On the lateral view there is a small patchy infiltrate posteriorly and inferiorly. Heart size and pulmonary vascularity are normal. No osseous abnormality. IMPRESSION: Small patchy infiltrate at 1 of the lung bases posteriorly and inferiorly. Prominent bronchitic changes. Electronically Signed   By: Francene Boyers  M.D.   On: 12/06/2015 14:39   Dg Chest 2 View  12/04/2015  CLINICAL DATA:  Acute onset of hyperglycemia. Dizziness, cough and congestion. Vomiting and diarrhea. Initial encounter. EXAM: CHEST  2 VIEW COMPARISON:  Chest radiograph performed 10/31/2015 FINDINGS: The lungs are well-aerated. Mild vascular congestion is noted. Peribronchial thickening is noted. Minimal bilateral opacities may reflect atelectasis or mild pneumonia, depending on the patient's symptoms. There is no evidence of pleural effusion or pneumothorax. The heart is mildly enlarged. No acute osseous abnormalities are seen. IMPRESSION: Mild vascular congestion and mild cardiomegaly. Peribronchial thickening noted. Minimal bilateral opacities may reflect atelectasis or possibly mild pneumonia, depending on the patient's symptoms. Electronically Signed   By: Roanna Raider M.D.   On: 12/04/2015 03:32   Ct Head Wo Contrast  12/18/2015  CLINICAL DATA:  Larey Seat and hit back of head today. Unable pupils, weakness. History of chronic kidney disease, hypertension, diabetes, migraine. EXAM: CT HEAD WITHOUT CONTRAST TECHNIQUE: Contiguous axial images were obtained from the base of the skull through the vertex without intravenous contrast. COMPARISON:  CT head October 31, 2015 FINDINGS: The ventricles and sulci are mildly prominent for age. No intraparenchymal hemorrhage, mass effect nor midline shift. Patchy supratentorial white matter hypodensities. No acute large vascular territory infarcts. No abnormal extra-axial fluid collections. Basal cisterns are patent. Moderate calcific atherosclerosis of the carotid siphons. No skull fracture. LEFT occipital scalp thickening was present on prior examination. The included ocular globes and orbital contents are non-suspicious. Old RIGHT medial orbital blowout fracture. Old nondisplaced bilateral nasal bone fractures. Maxillary mucosal retention cysts. Ethmoid mucosal thickening. IMPRESSION: Acute intracranial  process. Mild global parenchymal brain volume loss for age. Mild chronic small vessel ischemic disease. Electronically Signed   By: Awilda Metro M.D.   On: 12/18/2015 23:00   Dg Chest Port 1 View  12/22/2015  CLINICAL DATA:  Chronic CHF, acute respiratory failure with healthcare associated pneumonia, acute and chronic renal failure, diabetes EXAM: PORTABLE CHEST 1 VIEW COMPARISON:  Portable chest x-ray of December 20, 2015 FINDINGS: The lungs are well-expanded. Confluent alveolar opacities persist but have become less conspicuous. The cardiac silhouette remains enlarged. The pulmonary vascularity remains engorged. There is no pleural effusion. The left internal jugular venous catheter tip is stable projecting over the junction of the middle and distal thirds of the SVC. IMPRESSION: Slight interval improvement in the bilateral alveolar infiltrates consistent with improving pneumonia and CHF. Electronically Signed   By: David  [place] M.D.   On: 12/22/2015 08:08   Dg Chest Portable 1 View  12/20/2015  CLINICAL DATA:  Central line placement EXAM: PORTABLE CHEST 1 VIEW COMPARISON:  12/19/2015 FINDINGS: Bilateral patchy interstitial and alveolar airspace opacities. No significant pleural effusion. No pneumothorax. Stable cardiomediastinal silhouette. Left jugular central venous catheter with the tip projecting over the cavoatrial junction. No acute osseous abnormality. IMPRESSION: 1. Left jugular central venous catheter with the tip projecting over the cavoatrial junction. No pneumothorax. 2. Bilateral patchy interstitial and alveolar airspace opacities which may reflect pulmonary edema versus multilobar pneumonia. Electronically Signed   By: Elige Ko   On: 12/20/2015 14:12   Dg Chest Portable 1 View  12/16/2015  CLINICAL DATA:  Hypertension.  Hyperglycemia. EXAM:  PORTABLE CHEST 1 VIEW COMPARISON:  12/13/2015 FINDINGS: Heart size is mildly enlarged. The lung volumes are low. There are diffuse bilateral  lung opacities which may reflect multifocal infection or diffuse pulmonary edema. When compared with previous exam the aeration to lungs have worsened in the interval. IMPRESSION: 1. Worsening bilateral lung opacities compatible with multifocal infection and/or edema. Electronically Signed   By: Signa Kell M.D.   On: 12/16/2015 16:28   Dg Chest Port 1 View  12/13/2015  CLINICAL DATA:  Healthcare associated pneumonia, diabetes, chronic CHF, chronic renal insufficiency, substance abuse, current smoker. EXAM: PORTABLE CHEST 1 VIEW COMPARISON:  PA and lateral chest x-ray of December 11, 2015 FINDINGS: The lungs are slightly less well inflated. There remain confluent interstitial and alveolar opacities bilaterally. These are more conspicuous today. The heart is top-normal in size. The pulmonary vascularity is prominent centrally but no definite cephalization of the vascular pattern is observed. The bony thorax exhibits no acute abnormality. IMPRESSION: Bilateral interstitial and alveolar pneumonia with chronic low-grade compensated CHF. The appearance of the chest has deteriorated somewhat since yesterday's study which may be in part due to the portable technique versus the PA and lateral technique. Electronically Signed   By: David  [place] M.D.   On: 12/13/2015 08:01    Jeoffrey Massed, MD  [facility] Pager:[phone]  If 7PM-7AM, please contact night-coverage [email or web address] Password [email or web address] 12/23/2015, 1:29 PM   LOS: 7 days

## 2015-12-23 NOTE — Progress Notes (Signed)
ANTIBIOTIC CONSULT NOTE - Follow up Pharmacy Consult for Cefepime Indication: pneumonia  Allergies  Allergen Reactions  . Latex Rash    Patient Measurements: Height: 5\' 11"  (180.3 cm) Weight: 195 lb 5.2 oz (88.6 kg) IBW/kg (Calculated) : 75.3  Vital Signs: Temp: 98.1 F (36.7 C) (01/14 1432) Temp Source: Oral (01/14 0454) BP: 143/67 mmHg (01/14 1432) Pulse Rate: 86 (01/14 1432) Intake/Output from previous day: 01/13 0701 - 01/14 0700 In: 860 [P.O.:790; I.V.:20; IV Piggyback:50] Out: 2950 [Urine:2950] Intake/Output from this shift:    Labs:  Recent Labs  12/21/15 0710 12/22/15 0358 12/23/15 0511  WBC 7.1 5.8 6.0  HGB 7.6* 7.5* 8.0*  PLT 274 280 292  CREATININE 2.56* 2.54* 2.53*   Estimated Creatinine Clearance: 32.2 mL/min (by C-G formula based on Cr of 2.53). No results for input(s): VANCOTROUGH, VANCOPEAK, VANCORANDOM, GENTTROUGH, GENTPEAK, GENTRANDOM, TOBRATROUGH, TOBRAPEAK, TOBRARND, AMIKACINPEAK, AMIKACINTROU, AMIKACIN in the last 72 hours.   Microbiology: Recent Results (from the past 720 hour(s))  Blood culture (routine x 2)     Status: None   Collection Time: 12/04/15  3:45 AM  Result Value Ref Range Status   Specimen Description BLOOD RIGHT HAND  Final   Special Requests IN PEDIATRIC BOTTLE 4CC  Final   Culture   Final    NO GROWTH 5 DAYS [facility]    Report Status 12/09/2015 FINAL  Final  Blood culture (routine x 2)     Status: None   Collection Time: 12/04/15  4:50 AM  Result Value Ref Range Status   Specimen Description BLOOD LEFT WRIST  Final   Special Requests BOTTLES DRAWN AEROBIC AND ANAEROBIC 5CC  Final   Culture   Final    NO GROWTH 5 DAYS [facility]    Report Status 12/09/2015 FINAL  Final  MRSA PCR Screening     Status: None   Collection Time: 12/07/15  7:17 AM  Result Value Ref Range Status   MRSA by PCR NEGATIVE NEGATIVE Final    Comment:        The GeneXpert MRSA Assay  (FDA approved for NASAL specimens only), is one component of a comprehensive MRSA colonization surveillance program. It is not intended to diagnose MRSA infection nor to guide or monitor treatment for MRSA infections.   Blood culture (routine x 2)     Status: None   Collection Time: 12/11/15  8:34 AM  Result Value Ref Range Status   Specimen Description BLOOD LEFT FOREARM  Final   Special Requests IN PEDIATRIC BOTTLE 3CC  Final   Culture NO GROWTH 5 DAYS  Final   Report Status 12/16/2015 FINAL  Final  Blood culture (routine x 2)     Status: None   Collection Time: 12/11/15  9:15 AM  Result Value Ref Range Status   Specimen Description BLOOD ARM LEFT UPPER  Final   Special Requests IN PEDIATRIC BOTTLE 3CC  Final   Culture NO GROWTH 5 DAYS  Final   Report Status 12/16/2015 FINAL  Final  Culture, blood (Routine X 2) w Reflex to ID Panel     Status: None   Collection Time: 12/16/15  5:32 PM  Result Value Ref Range Status   Specimen Description BLOOD RIGHT HAND  Final   Special Requests IN PEDIATRIC BOTTLE 3CC  Final   Culture NO GROWTH 5 DAYS  Final   Report Status 12/21/2015 FINAL  Final  Culture, blood (Routine X 2) w Reflex to ID Panel  Status: None   Collection Time: 12/16/15  7:13 PM  Result Value Ref Range Status   Specimen Description BLOOD  Final   Special Requests BOTTLES DRAWN AEROBIC ONLY 6CC  Final   Culture NO GROWTH 5 DAYS  Final   Report Status 12/21/2015 FINAL  Final    Medical History: Past Medical History  Diagnosis Date  . CKD (chronic kidney disease), stage III   . Anemia   . HTN (hypertension)   . Type II diabetes mellitus (HCC)   . DVT (deep venous thrombosis) (HCC)     LLE  . Pneumonia 12/06/2015  . GERD (gastroesophageal reflux disease)   . Migraine     "a few times/month" (12/06/2015)  . Depression     Medications:  Scheduled:  . ceFEPime (MAXIPIME) IV  1 g Intravenous Q24H  . diltiazem  120 mg Oral Q12H  . enoxaparin (LOVENOX)  injection  40 mg Subcutaneous Q24H  . ferrous sulfate  325 mg Oral Q breakfast  . folic acid  1 mg Oral Daily  . furosemide  80 mg Intravenous TID  . guaiFENesin  600 mg Oral BID  . hydrALAZINE  50 mg Oral TID  . insulin aspart  0-15 Units Subcutaneous TID WC  . insulin aspart  0-5 Units Subcutaneous QHS  . [START ON 12/24/2015] insulin detemir  25 Units Subcutaneous Daily  . multivitamin with minerals  1 tablet Oral Daily  . thiamine  100 mg Oral Daily   Infusions:   Assessment: 65 yo M restarting IV antibiotics for HCAP.  Pt was initially on PO Levaquin due to loss of IV access/  Planned for PICC placement. Restarted IV Cefepime on 1/11  Of note, patient has some baseline CKD.  SCr has trended up this admission. SCr now 2.53, ~ stable,  CrCl  ~32 ml/min. . Blood cultures continue to be negative Afebrile, WBC 6K  Zosyn x 1 1/7 Vancomycin 1/7 >> 1/8 (lost IV) Levoflox PO 1/8 >> 1/11 Cefepime 1/11 >>  1/7 blood x 2 negative 1/2 blood x 2 neg F MSRA PCR neg  Goal of Therapy:  Eradication of Infection  Plan:  Continue Cefepime 1gm IV q24h Monitor clinical status, renal function, LOT,   Noah Delaine, RPh Clinical Pharmacist Pager: [phone] 12/23/2015 4:40 PM

## 2015-12-23 NOTE — Clinical Social Work Note (Addendum)
[facility] tomorrow once he is medically ready for discharge and orders have been received.  CSW [facility] patient [facility].  Patient in agreement to going to SNF for short term rehab.  Ervin KnackEric R. Levern Pitter, MSW, LCSWA [phone] 12/23/2015 11:11 AM

## 2015-12-24 DIAGNOSIS — Z590 Homelessness: Secondary | ICD-10-CM

## 2015-12-24 LAB — BASIC METABOLIC PANEL
ANION GAP: 10 (ref 5–15)
BUN: 58 mg/dL — AB (ref 6–20)
CHLORIDE: 100 mmol/L — AB (ref 101–111)
CO2: 27 mmol/L (ref 22–32)
Calcium: 8.3 mg/dL — ABNORMAL LOW (ref 8.9–10.3)
Creatinine, Ser: 2.23 mg/dL — ABNORMAL HIGH (ref 0.61–1.24)
GFR calc Af Amer: 35 mL/min — ABNORMAL LOW (ref >60)
GFR, EST NON AFRICAN AMERICAN: 30 mL/min — AB (ref >60)
GLUCOSE: 166 mg/dL — AB (ref 65–99)
POTASSIUM: 4.1 mmol/L (ref 3.5–5.1)
Sodium: 137 mmol/L (ref 135–145)

## 2015-12-24 LAB — GLUCOSE, CAPILLARY
GLUCOSE-CAPILLARY: 205 mg/dL — AB (ref 65–99)
Glucose-Capillary: 166 mg/dL — ABNORMAL HIGH (ref 65–99)

## 2015-12-24 LAB — PROCALCITONIN: Procalcitonin: 0.17 ng/mL

## 2015-12-24 MED ORDER — FUROSEMIDE 40 MG PO TABS: 60.0000 mg | ORAL_TABLET | Freq: Two times a day (BID) | ORAL | Status: DC

## 2015-12-24 MED ORDER — GUAIFENESIN ER 600 MG PO TB12: 600.0000 mg | ORAL_TABLET | Freq: Two times a day (BID) | ORAL | Status: DC | PRN

## 2015-12-24 MED ORDER — INSULIN DETEMIR 100 UNIT/ML ~~LOC~~ SOLN: 25.0000 [IU] | Freq: Every day | SUBCUTANEOUS | Status: AC

## 2015-12-24 MED ORDER — HYDRALAZINE HCL 50 MG PO TABS: 25.0000 mg | ORAL_TABLET | Freq: Three times a day (TID) | ORAL | Status: DC

## 2015-12-24 MED ORDER — POTASSIUM CHLORIDE ER 10 MEQ PO TBCR: 20.0000 meq | EXTENDED_RELEASE_TABLET | Freq: Every day | ORAL | Status: DC

## 2015-12-24 MED ORDER — INSULIN ASPART 100 UNIT/ML ~~LOC~~ SOLN: SUBCUTANEOUS | Status: DC

## 2015-12-24 MED ORDER — IPRATROPIUM-ALBUTEROL 0.5-2.5 (3) MG/3ML IN SOLN: 3.0000 mL | RESPIRATORY_TRACT | Status: AC | PRN

## 2015-12-24 MED ORDER — FUROSEMIDE 80 MG PO TABS
80.0000 mg | ORAL_TABLET | Freq: Three times a day (TID) | ORAL | Status: DC
  Administered 2015-12-24: 80 mg via ORAL
  Filled 2015-12-24: qty 1

## 2015-12-24 MED ORDER — POTASSIUM CHLORIDE ER 10 MEQ PO TBCR: 20.0000 meq | EXTENDED_RELEASE_TABLET | Freq: Two times a day (BID) | ORAL | Status: DC

## 2015-12-24 NOTE — Discharge Summary (Signed)
PATIENT DETAILS Name: Clayton [name] Age: 65 y.o. Sex: male Date of Birth: May 27, 1951 MRN: [id number]. Admitting Physician: Meredeth Ide, MD [id number] NOT IN SYSTEM  Admit Date: 12/16/2015 Discharge date: 12/24/2015  Recommendations for Outpatient Follow-up:  1. Please repeat Chest Xray in 4-6 weeks  2. Refer patient to Nephrology and Cardiology 3. Repeat BMET in 5 days, and check lytes  PRIMARY DISCHARGE DIAGNOSIS:  Principal Problem:   HCAP (healthcare-associated pneumonia) Active Problems:   Noncompliance with medications   Alcohol abuse   Acute renal failure superimposed on stage 3 chronic kidney disease (HCC)   Essential hypertension   Cocaine abuse   Acute on chronic diastolic CHF (congestive heart failure) (HCC)   Homeless   Anemia of chronic disease   Type 2 diabetes mellitus with hyperosmolar nonketotic hyperglycemia (HCC)   Acute respiratory failure with hypoxia (HCC)   Prolonged Q-T interval on ECG      PAST MEDICAL HISTORY: Past Medical History  Diagnosis Date  . CKD (chronic kidney disease), stage III   . Anemia   . HTN (hypertension)   . Type II diabetes mellitus (HCC)   . DVT (deep venous thrombosis) (HCC)     LLE  . Pneumonia 12/06/2015  . GERD (gastroesophageal reflux disease)   . Migraine     "a few times/month" (12/06/2015)  . Depression     DISCHARGE MEDICATIONS: Current Discharge Medication List    START taking these medications   Details  furosemide (LASIX) 40 MG tablet Take 1.5 tablets (60 mg total) by mouth 2 (two) times daily.    guaiFENesin (MUCINEX) 600 MG 12 hr tablet Take 1 tablet (600 mg total) by mouth 2 (two) times daily as needed.    insulin aspart (NOVOLOG) 100 UNIT/ML injection 0-15 Units, Subcutaneous, 3 times daily with meals CBG < 70: implement hypoglycemia protocol CBG 70 - 120: 0 units CBG 121 - 150: 2 units CBG 151 - 200: 3 units CBG 201 - 250: 5 units CBG 251 - 300: 8 units CBG 301 - 350: 11 units CBG 351  - 400: 15 units CBG > 400: call MD Qty: 10 mL, Refills: 11    insulin detemir (LEVEMIR) 100 UNIT/ML injection Inject 0.25 mLs (25 Units total) into the skin daily.    ipratropium-albuterol (DUONEB) 0.5-2.5 (3) MG/3ML SOLN Take 3 mLs by nebulization every 4 (four) hours as needed.    potassium chloride (K-DUR) 10 MEQ tablet Take 2 tablets (20 mEq total) by mouth daily.      CONTINUE these medications which have CHANGED   Details  hydrALAZINE (APRESOLINE) 50 MG tablet Take 0.5 tablets (25 mg total) by mouth 3 (three) times daily.      CONTINUE these medications which have NOT CHANGED   Details  diltiazem (CARDIZEM SR) 120 MG 12 hr capsule Take 1 capsule (120 mg total) by mouth every 12 (twelve) hours. Qty: 60 capsule, Refills: 0    glyBURIDE (DIABETA) 5 MG tablet Take 2 tablets (10 mg total) by mouth 2 (two) times daily with a meal. Qty: 60 tablet, Refills: 0    ferrous sulfate 325 (65 FE) MG tablet Take 1 tablet (325 mg total) by mouth daily with breakfast. Qty: 30 tablet, Refills: 0      STOP taking these medications     levofloxacin (LEVAQUIN) 750 MG tablet      metFORMIN (GLUCOPHAGE) 1000 MG tablet         ALLERGIES:   Allergies  Allergen Reactions  .  Latex Rash    BRIEF HPI:  See H&P, Labs, Consult and Test reports for all details in brief, patient is a 65 year old black male with history of chronic diastolic failure, polysubstance abuse, noncompliance, admitted for evaluation of hypertension and hyperglycemia. Was found to have healthcare associated pneumonia.  CONSULTATIONS:   None  PERTINENT RADIOLOGIC STUDIES: Dg Chest 2 View  12/19/2015  CLINICAL DATA:  Shortness of breath, confusion EXAM: CHEST  2 VIEW COMPARISON:  12/16/2015 FINDINGS: Multifocal patchy opacities, suspicious for multifocal pneumonia, less likely moderate interstitial edema. No definite pleural effusions. No pneumothorax. Cardiomegaly. IMPRESSION: Multifocal patchy opacities, suspicious for  multifocal pneumonia, grossly unchanged. Electronically Signed   By: Charline Bills M.D.   On: 12/19/2015 11:14   Dg Chest 2 View  12/11/2015  CLINICAL DATA:  65 year old male with shortness of breath and cough for 3 weeks. EXAM: CHEST  2 VIEW COMPARISON:  12/06/2015 and prior exams FINDINGS: Mild cardiomegaly again noted. Very small bilateral pleural effusions have increased. Diffuse peribronchial thickening is again noted. New patchy right upper lobe opacities and unchanged patchy lower lobe opacities are noted and suspicious for bronchopneumonia. There is no evidence of pneumothorax. IMPRESSION: New patchy right upper lobe opacities and unchanged patchy lower lobe opacity suspicious for bronchopneumonia. Slightly increasing very small bilateral pleural effusions. Mild cardiomegaly. Electronically Signed   By: Harmon Pier M.D.   On: 12/11/2015 07:59   Dg Chest 2 View  12/06/2015  CLINICAL DATA:  Cough. EXAM: CHEST  2 VIEW COMPARISON:  12/04/2015 and 10/31/2015 FINDINGS: There is prominent peribronchial thickening. On the lateral view there is a small patchy infiltrate posteriorly and inferiorly. Heart size and pulmonary vascularity are normal. No osseous abnormality. IMPRESSION: Small patchy infiltrate at 1 of the lung bases posteriorly and inferiorly. Prominent bronchitic changes. Electronically Signed   By: Francene Boyers M.D.   On: 12/06/2015 14:39   Dg Chest 2 View  12/04/2015  CLINICAL DATA:  Acute onset of hyperglycemia. Dizziness, cough and congestion. Vomiting and diarrhea. Initial encounter. EXAM: CHEST  2 VIEW COMPARISON:  Chest radiograph performed 10/31/2015 FINDINGS: The lungs are well-aerated. Mild vascular congestion is noted. Peribronchial thickening is noted. Minimal bilateral opacities may reflect atelectasis or mild pneumonia, depending on the patient's symptoms. There is no evidence of pleural effusion or pneumothorax. The heart is mildly enlarged. No acute osseous abnormalities  are seen. IMPRESSION: Mild vascular congestion and mild cardiomegaly. Peribronchial thickening noted. Minimal bilateral opacities may reflect atelectasis or possibly mild pneumonia, depending on the patient's symptoms. Electronically Signed   By: Roanna Raider M.D.   On: 12/04/2015 03:32   Ct Head Wo Contrast  12/18/2015  CLINICAL DATA:  Larey Seat and hit back of head today. Unable pupils, weakness. History of chronic kidney disease, hypertension, diabetes, migraine. EXAM: CT HEAD WITHOUT CONTRAST TECHNIQUE: Contiguous axial images were obtained from the base of the skull through the vertex without intravenous contrast. COMPARISON:  CT head October 31, 2015 FINDINGS: The ventricles and sulci are mildly prominent for age. No intraparenchymal hemorrhage, mass effect nor midline shift. Patchy supratentorial white matter hypodensities. No acute large vascular territory infarcts. No abnormal extra-axial fluid collections. Basal cisterns are patent. Moderate calcific atherosclerosis of the carotid siphons. No skull fracture. LEFT occipital scalp thickening was present on prior examination. The included ocular globes and orbital contents are non-suspicious. Old RIGHT medial orbital blowout fracture. Old nondisplaced bilateral nasal bone fractures. Maxillary mucosal retention cysts. Ethmoid mucosal thickening. IMPRESSION: Acute intracranial process. Mild global parenchymal brain volume  loss for age. Mild chronic small vessel ischemic disease. Electronically Signed   By: Awilda Metroourtnay  Bloomer M.D.   On: 12/18/2015 23:00   Dg Chest Port 1 View  12/22/2015  CLINICAL DATA:  Chronic CHF, acute respiratory failure with healthcare associated pneumonia, acute and chronic renal failure, diabetes EXAM: PORTABLE CHEST 1 VIEW COMPARISON:  Portable chest x-ray of December 20, 2015 FINDINGS: The lungs are well-expanded. Confluent alveolar opacities persist but have become less conspicuous. The cardiac silhouette remains enlarged. The  pulmonary vascularity remains engorged. There is no pleural effusion. The left internal jugular venous catheter tip is stable projecting over the junction of the middle and distal thirds of the SVC. IMPRESSION: Slight interval improvement in the bilateral alveolar infiltrates consistent with improving pneumonia and CHF. Electronically Signed   By: David  SwazilandJordan M.D.   On: 12/22/2015 08:08   Dg Chest Portable 1 View  12/20/2015  CLINICAL DATA:  Central line placement EXAM: PORTABLE CHEST 1 VIEW COMPARISON:  12/19/2015 FINDINGS: Bilateral patchy interstitial and alveolar airspace opacities. No significant pleural effusion. No pneumothorax. Stable cardiomediastinal silhouette. Left jugular central venous catheter with the tip projecting over the cavoatrial junction. No acute osseous abnormality. IMPRESSION: 1. Left jugular central venous catheter with the tip projecting over the cavoatrial junction. No pneumothorax. 2. Bilateral patchy interstitial and alveolar airspace opacities which may reflect pulmonary edema versus multilobar pneumonia. Electronically Signed   By: Elige KoHetal  Patel   On: 12/20/2015 14:12   Dg Chest Portable 1 View  12/16/2015  CLINICAL DATA:  Hypertension.  Hyperglycemia. EXAM: PORTABLE CHEST 1 VIEW COMPARISON:  12/13/2015 FINDINGS: Heart size is mildly enlarged. The lung volumes are low. There are diffuse bilateral lung opacities which may reflect multifocal infection or diffuse pulmonary edema. When compared with previous exam the aeration to lungs have worsened in the interval. IMPRESSION: 1. Worsening bilateral lung opacities compatible with multifocal infection and/or edema. Electronically Signed   By: Signa Kellaylor  Stroud M.D.   On: 12/16/2015 16:28   Dg Chest Port 1 View  12/13/2015  CLINICAL DATA:  Healthcare associated pneumonia, diabetes, chronic CHF, chronic renal insufficiency, substance abuse, current smoker. EXAM: PORTABLE CHEST 1 VIEW COMPARISON:  PA and lateral chest x-ray of December 11, 2015 FINDINGS: The lungs are slightly less well inflated. There remain confluent interstitial and alveolar opacities bilaterally. These are more conspicuous today. The heart is top-normal in size. The pulmonary vascularity is prominent centrally but no definite cephalization of the vascular pattern is observed. The bony thorax exhibits no acute abnormality. IMPRESSION: Bilateral interstitial and alveolar pneumonia with chronic low-grade compensated CHF. The appearance of the chest has deteriorated somewhat since yesterday's study which may be in part due to the portable technique versus the PA and lateral technique. Electronically Signed   By: David  [place]ilandJordan M.D.   On: 12/13/2015 08:01     PERTINENT LAB RESULTS: CBC:  Recent Labs  12/22/15 0358 12/23/15 0511  WBC 5.8 6.0  HGB 7.5* 8.0*  HCT 22.7* 24.2*  PLT 280 292   CMET CMP     Component Value Date/Time   NA 137 12/24/2015 0405   K 4.1 12/24/2015 0405   CL 100* 12/24/2015 0405   CO2 27 12/24/2015 0405   GLUCOSE 166* 12/24/2015 0405   BUN 58* 12/24/2015 0405   CREATININE 2.23* 12/24/2015 0405   CALCIUM 8.3* 12/24/2015 0405   PROT 5.4* 12/17/2015 0545   ALBUMIN 1.6* 12/17/2015 0545   AST 21 12/17/2015 0545   ALT 18 12/17/2015 0545  ALKPHOS 79 12/17/2015 0545   BILITOT 0.5 12/17/2015 0545   GFRNONAA 30* 12/24/2015 0405   GFRAA 35* 12/24/2015 0405    GFR Estimated Creatinine Clearance: 36.6 mL/min (by C-G formula based on Cr of 2.23). No results for input(s): LIPASE, AMYLASE in the last 72 hours. No results for input(s): CKTOTAL, CKMB, CKMBINDEX, TROPONINI in the last 72 hours. Invalid input(s): POCBNP No results for input(s): DDIMER in the last 72 hours. No results for input(s): HGBA1C in the last 72 hours. No results for input(s): CHOL, HDL, LDLCALC, TRIG, CHOLHDL, LDLDIRECT in the last 72 hours. No results for input(s): TSH, T4TOTAL, T3FREE, THYROIDAB in the last 72 hours.  Invalid input(s): FREET3 No results for  input(s): VITAMINB12, FOLATE, FERRITIN, TIBC, IRON, RETICCTPCT in the last 72 hours. Coags: No results for input(s): INR in the last 72 hours.  Invalid input(s): PT Microbiology: Recent Results (from the past 240 hour(s))  Culture, blood (Routine X 2) w Reflex to ID Panel     Status: None   Collection Time: 12/16/15  5:32 PM  Result Value Ref Range Status   Specimen Description BLOOD RIGHT HAND  Final   Special Requests IN PEDIATRIC BOTTLE 3CC  Final   Culture NO GROWTH 5 DAYS  Final   Report Status 12/21/2015 FINAL  Final  Culture, blood (Routine X 2) w Reflex to ID Panel     Status: None   Collection Time: 12/16/15  7:13 PM  Result Value Ref Range Status   Specimen Description BLOOD  Final   Special Requests BOTTLES DRAWN AEROBIC ONLY 6CC  Final   Culture NO GROWTH 5 DAYS  Final   Report Status 12/21/2015 FINAL  Final     BRIEF HOSPITAL COURSE:  Acute hypoxic respiratory failure: Secondary to acute CHF and possible pneumonia. Treated with IV Lasix and cefepime. Titrated off oxygen and now on room air.  Acute diastolic heart failure: Managed IV Lasix, much better-and more compensated. Now on room air, we'll transition to oral Lasix. Please continue to  check electrolytes closely while at SNF.Consider referral to cardiology in the outpatient setting.  ?HCAP:treated with cefepime, has completed a course of antibiotics-and does not require any further antibiotics on discharge.  Continue cefepime, blood cultures continue to be negative. HIV/influenza panel negative.   Acute renal failure superimposed on stage 3 chronic kidney disease: Creatinine down trending and almost back to usual baseline. Managed with supportive measures. We be on diuretics on discharge, please continue to monitor electrolytes closely. Suspect underlying diabetic nephropathy at baseline. Please refer to nephrology in the  outpatient  setting  Type 2 diabetes: CBGs controlled, continue  Levemir to 25 units and SSI.  Follow CBGs and adjust accordingly  Hypertension: Continue hydralazine and diltiazem, follow BP trend and adjust accordingly  Anemia: Likely secondary to chronic disease/acute illness. Transfused 1 unit of PRBC on 1/11. No overt evidence of GI bleeding.please follow CBC closely   Prolonged QT: Resolved  Alcohol abuse: No signs of withdrawal, managed with Ativan per CIWA protocol  Cocaine abuse: Counseled  Noncompliance to medications: Counseled  Homelessness: being discharged to SNF  TODAY-DAY OF DISCHARGE:  Subjective:   Clayton [name] today has no headache,no chest abdominal pain,no new weakness tingling or numbness.  Objective:   Blood pressure 156/69, pulse 81, temperature 97.7 F (36.5 C), temperature source Oral, resp. rate 18, height 5\' 11"  (1.803 m), weight 87.363 kg (192 lb 9.6 oz), SpO2 91 %.  Intake/Output Summary (Last 24 hours) at 12/24/15 1100 Last  data filed at 12/24/15 1001  Gross per 24 hour  Intake    526 ml  Output   3570 ml  Net  -3044 ml   Filed Weights   12/22/15 0500 12/23/15 0454 12/24/15 0930  Weight: 94.348 kg (208 lb) 88.6 kg (195 lb 5.2 oz) 87.363 kg (192 lb 9.6 oz)    Exam Awake Alert, Oriented *3, No new F.N deficits, Normal affect [place].AT,PERRAL Supple Neck,No JVD, No cervical lymphadenopathy appriciated.  Symmetrical Chest wall movement, Good air movement bilaterally, CTAB RRR,No Gallops,Rubs or new Murmurs, No Parasternal Heave +ve B.Sounds, Abd Soft, Non tender, No organomegaly appriciated, No rebound -guarding or rigidity. No Cyanosis, Clubbing or edema, No new Rash or bruise  DISCHARGE CONDITION: Stable  DISPOSITION: SNF  DISCHARGE INSTRUCTIONS:    Activity:  As tolerated with Full fall precautions use walker/cane & assistance as needed  Check CBGs before meals and at bedtime  Get Medicines reviewed and adjusted: Please take all your medications with you for your next visit with your Primary MD  Please request your  Primary MD to go over all hospital tests and procedure/radiological results at the follow up, please ask your Primary MD to get all Hospital records sent to his/her office.  If you experience worsening of your admission symptoms, develop shortness of breath, life threatening emergency, suicidal or homicidal thoughts you must seek medical attention immediately by calling 911 or calling your MD immediately  if symptoms less severe.  You must read complete instructions/literature along with all the possible adverse reactions/side effects for all the Medicines you take and that have been prescribed to you. Take any new Medicines after you have completely understood and accpet all the possible adverse reactions/side effects.   Do not drive when taking Pain medications.   Do not take more than prescribed Pain, Sleep and Anxiety Medications  Special Instructions: If you have smoked or chewed Tobacco  in the last 2 yrs please stop smoking, stop any regular Alcohol  and or any Recreational drug use.  Wear Seat belts while driving.  Please note  You were cared for by a hospitalist during your hospital stay. Once you are discharged, your primary care physician will handle any further medical issues. Please note that NO REFILLS for any discharge medications will be authorized once you are discharged, as it is imperative that you return to your primary care physician (or establish a relationship with a primary care physician if you do not have one) for your aftercare needs so that they can reassess your need for medications and monitor your lab values.   Diet recommendation: Diabetic Diet Heart Healthy diet Fluid restriction 1.5 lit/day  Discharge Instructions    (HEART FAILURE PATIENTS) Call MD:  Anytime you have any of the following symptoms: 1) 3 pound weight gain in 24 hours or 5 pounds in 1 week 2) shortness of breath, with or without a dry hacking cough 3) swelling in the hands, feet or stomach 4)  if you have to sleep on extra pillows at night in order to breathe.    Complete by:  As directed      Diet - low sodium heart healthy    Complete by:  As directed      Diet Carb Modified    Complete by:  As directed      Increase activity slowly    Complete by:  As directed            Follow-up Information  Schedule an appointment as soon as possible for a visit in 1 week to follow up.   Contact information:   Primary MD      Total Time spent on discharge equals  45 minutes.  SignedJeoffrey Massed 12/24/2015 11:00 AM

## 2015-12-24 NOTE — Clinical Social Work Note (Signed)
[facility]  Patient and family agreeable to plans will transport via ems RN to call report to [phone] .  Windell MouldingEric Dalessandro Baldyga, MSW, Theresia MajorsLCSWA [phone]

## 2015-12-24 NOTE — Progress Notes (Signed)
Nsg Discharge Note  Admit Date:  12/16/2015 Discharge date: 12/24/2015   Clayton [name] to be D/C'd Nursing Home per MD order.  AVS completed.  Copy for chart, and copy for patient signed, and dated. Patient/caregiver able to verbalize understanding.  Discharge Medication:   Medication List    STOP taking these medications        levofloxacin 750 MG tablet  Commonly known as:  LEVAQUIN     metFORMIN 1000 MG tablet  Commonly known as:  GLUCOPHAGE      TAKE these medications        diltiazem 120 MG 12 hr capsule  Commonly known as:  CARDIZEM SR  Take 1 capsule (120 mg total) by mouth every 12 (twelve) hours.     ferrous sulfate 325 (65 FE) MG tablet  Take 1 tablet (325 mg total) by mouth daily with breakfast.     furosemide 40 MG tablet  Commonly known as:  LASIX  Take 1.5 tablets (60 mg total) by mouth 2 (two) times daily.     glyBURIDE 5 MG tablet  Commonly known as:  DIABETA  Take 2 tablets (10 mg total) by mouth 2 (two) times daily with a meal.     guaiFENesin 600 MG 12 hr tablet  Commonly known as:  MUCINEX  Take 1 tablet (600 mg total) by mouth 2 (two) times daily as needed.     hydrALAZINE 50 MG tablet  Commonly known as:  APRESOLINE  Take 0.5 tablets (25 mg total) by mouth 3 (three) times daily.     insulin aspart 100 UNIT/ML injection  Commonly known as:  novoLOG  0-15 Units, Subcutaneous, 3 times daily with meals CBG < 70: implement hypoglycemia protocol CBG 70 - 120: 0 units CBG 121 - 150: 2 units CBG 151 - 200: 3 units CBG 201 - 250: 5 units CBG 251 - 300: 8 units CBG 301 - 350: 11 units CBG 351 - 400: 15 units CBG > 400: call MD     insulin detemir 100 UNIT/ML injection  Commonly known as:  LEVEMIR  Inject 0.25 mLs (25 Units total) into the skin daily.     ipratropium-albuterol 0.5-2.5 (3) MG/3ML Soln  Commonly known as:  DUONEB  Take 3 mLs by nebulization every 4 (four) hours as needed.     potassium chloride 10 MEQ tablet  Commonly known as:   K-DUR  Take 2 tablets (20 mEq total) by mouth daily.        Discharge Assessment: Filed Vitals:   12/24/15 0515 12/24/15 1308  BP: 156/69 141/61  Pulse: 81 81  Temp: 97.7 F (36.5 C) 98.2 F (36.8 C)  Resp: 18 19   Skin clean, dry and intact without evidence of skin break down, no evidence of skin tears noted. IV catheter discontinued intact. Site without signs and symptoms of complications - no redness or edema noted at insertion site, patient denies c/o pain - only slight tenderness at site.  Dressing with slight pressure applied.  D/c Instructions-Education: Discharge instructions given to patient/family with verbalized understanding. D/c education completed with patient/family including follow up instructions, medication list, d/c activities limitations if indicated, with other d/c instructions as indicated by MD - patient able to verbalize understanding, all questions fully answered. Patient instructed to return to ED, call 911, or call MD for any changes in condition.  Patient escorted via EMS, to SNF. [place] into [facility].   Kern ReapBrumagin, [name], RN 12/24/2015 3:01 PM

## 2016-01-09 ENCOUNTER — Emergency Department (HOSPITAL_COMMUNITY): Payer: Medicaid Other

## 2016-01-09 ENCOUNTER — Encounter (HOSPITAL_COMMUNITY): Payer: Self-pay | Admitting: Family Medicine

## 2016-01-09 ENCOUNTER — Inpatient Hospital Stay (HOSPITAL_COMMUNITY)
Admission: EM | Admit: 2016-01-09 | Discharge: 2016-01-15 | DRG: 291 | Disposition: A | Discharge status: Skilled Nursing Facility | Payer: Medicaid Other | Attending: Internal Medicine | Admitting: Internal Medicine

## 2016-01-09 DIAGNOSIS — N179 Acute kidney failure, unspecified: Secondary | ICD-10-CM | POA: Diagnosis not present

## 2016-01-09 DIAGNOSIS — N183 Chronic kidney disease, stage 3 (moderate): Secondary | ICD-10-CM

## 2016-01-09 DIAGNOSIS — Y95 Nosocomial condition: Secondary | ICD-10-CM | POA: Diagnosis present

## 2016-01-09 DIAGNOSIS — J189 Pneumonia, unspecified organism: Secondary | ICD-10-CM

## 2016-01-09 DIAGNOSIS — F1414 Cocaine abuse with cocaine-induced mood disorder: Secondary | ICD-10-CM | POA: Diagnosis not present

## 2016-01-09 DIAGNOSIS — Z5900 Homelessness unspecified: Secondary | ICD-10-CM

## 2016-01-09 DIAGNOSIS — B964 Proteus (mirabilis) (morganii) as the cause of diseases classified elsewhere: Secondary | ICD-10-CM | POA: Diagnosis present

## 2016-01-09 DIAGNOSIS — I130 Hypertensive heart and chronic kidney disease with heart failure and stage 1 through stage 4 chronic kidney disease, or unspecified chronic kidney disease: Secondary | ICD-10-CM | POA: Diagnosis present

## 2016-01-09 DIAGNOSIS — J9601 Acute respiratory failure with hypoxia: Secondary | ICD-10-CM

## 2016-01-09 DIAGNOSIS — E118 Type 2 diabetes mellitus with unspecified complications: Secondary | ICD-10-CM

## 2016-01-09 DIAGNOSIS — F17210 Nicotine dependence, cigarettes, uncomplicated: Secondary | ICD-10-CM | POA: Diagnosis present

## 2016-01-09 DIAGNOSIS — I160 Hypertensive urgency: Secondary | ICD-10-CM | POA: Diagnosis present

## 2016-01-09 DIAGNOSIS — Z833 Family history of diabetes mellitus: Secondary | ICD-10-CM | POA: Diagnosis not present

## 2016-01-09 DIAGNOSIS — Z794 Long term (current) use of insulin: Secondary | ICD-10-CM

## 2016-01-09 DIAGNOSIS — Z9114 Patient's other noncompliance with medication regimen: Secondary | ICD-10-CM | POA: Diagnosis not present

## 2016-01-09 DIAGNOSIS — N189 Chronic kidney disease, unspecified: Secondary | ICD-10-CM | POA: Diagnosis present

## 2016-01-09 DIAGNOSIS — J810 Acute pulmonary edema: Secondary | ICD-10-CM | POA: Insufficient documentation

## 2016-01-09 DIAGNOSIS — D638 Anemia in other chronic diseases classified elsewhere: Secondary | ICD-10-CM | POA: Diagnosis present

## 2016-01-09 DIAGNOSIS — I5033 Acute on chronic diastolic (congestive) heart failure: Secondary | ICD-10-CM

## 2016-01-09 DIAGNOSIS — I10 Essential (primary) hypertension: Secondary | ICD-10-CM | POA: Diagnosis present

## 2016-01-09 DIAGNOSIS — R109 Unspecified abdominal pain: Secondary | ICD-10-CM

## 2016-01-09 DIAGNOSIS — J9621 Acute and chronic respiratory failure with hypoxia: Secondary | ICD-10-CM | POA: Diagnosis present

## 2016-01-09 DIAGNOSIS — R0902 Hypoxemia: Secondary | ICD-10-CM

## 2016-01-09 DIAGNOSIS — E1122 Type 2 diabetes mellitus with diabetic chronic kidney disease: Secondary | ICD-10-CM | POA: Diagnosis present

## 2016-01-09 DIAGNOSIS — R0600 Dyspnea, unspecified: Secondary | ICD-10-CM | POA: Diagnosis present

## 2016-01-09 DIAGNOSIS — Z91148 Patient's other noncompliance with medication regimen for other reason: Secondary | ICD-10-CM

## 2016-01-09 LAB — CBC WITH DIFFERENTIAL/PLATELET
BASOS PCT: 0 %
Basophils Absolute: 0 10*3/uL (ref 0.0–0.1)
EOS ABS: 0.2 10*3/uL (ref 0.0–0.7)
EOS PCT: 1 %
HCT: 25.5 % — ABNORMAL LOW (ref 39.0–52.0)
HEMOGLOBIN: 8.2 g/dL — AB (ref 13.0–17.0)
Lymphocytes Relative: 7 %
Lymphs Abs: 0.8 10*3/uL (ref 0.7–4.0)
MCH: 28.5 pg (ref 26.0–34.0)
MCHC: 32.2 g/dL (ref 30.0–36.0)
MCV: 88.5 fL (ref 78.0–100.0)
Monocytes Absolute: 0.8 10*3/uL (ref 0.1–1.0)
Monocytes Relative: 7 %
NEUTROS PCT: 85 %
Neutro Abs: 10.1 10*3/uL — ABNORMAL HIGH (ref 1.7–7.7)
PLATELETS: 304 10*3/uL (ref 150–400)
RBC: 2.88 MIL/uL — AB (ref 4.22–5.81)
RDW: 15.2 % (ref 11.5–15.5)
WBC: 11.8 10*3/uL — AB (ref 4.0–10.5)

## 2016-01-09 LAB — BLOOD GAS, ARTERIAL
ACID-BASE DEFICIT: 3.3 mmol/L — AB (ref 0.0–2.0)
Acid-base deficit: 3.5 mmol/L — ABNORMAL HIGH (ref 0.0–2.0)
BICARBONATE: 20.2 meq/L (ref 20.0–24.0)
BICARBONATE: 20 meq/L (ref 20.0–24.0)
DRAWN BY: 235321
Drawn by: 257701
FIO2: 1
FIO2: 1
O2 SAT: 96.4 %
O2 Saturation: 95.1 %
PATIENT TEMPERATURE: 99.4
PCO2 ART: 33 mmHg — AB (ref 35.0–45.0)
PH ART: 7.404 (ref 7.350–7.450)
PO2 ART: 80.6 mmHg (ref 80.0–100.0)
PO2 ART: 87.2 mmHg (ref 80.0–100.0)
Patient temperature: 98.6
TCO2: 19.2 mmol/L (ref 0–100)
TCO2: 19 mmol/L (ref 0–100)
pCO2 arterial: 31 mmHg — ABNORMAL LOW (ref 35.0–45.0)
pH, Arterial: 7.427 (ref 7.350–7.450)

## 2016-01-09 LAB — I-STAT CHEM 8, ED
BUN: 47 mg/dL — ABNORMAL HIGH (ref 6–20)
CALCIUM ION: 1.19 mmol/L (ref 1.13–1.30)
Chloride: 108 mmol/L (ref 101–111)
Creatinine, Ser: 2 mg/dL — ABNORMAL HIGH (ref 0.61–1.24)
GLUCOSE: 113 mg/dL — AB (ref 65–99)
HCT: 25 % — ABNORMAL LOW (ref 39.0–52.0)
HEMOGLOBIN: 8.5 g/dL — AB (ref 13.0–17.0)
Potassium: 4.3 mmol/L (ref 3.5–5.1)
Sodium: 139 mmol/L (ref 135–145)
TCO2: 21 mmol/L (ref 0–100)

## 2016-01-09 LAB — COMPREHENSIVE METABOLIC PANEL
ALBUMIN: 2.7 g/dL — AB (ref 3.5–5.0)
ALK PHOS: 187 U/L — AB (ref 38–126)
ALT: 50 U/L (ref 17–63)
ANION GAP: 9 (ref 5–15)
AST: 49 U/L — ABNORMAL HIGH (ref 15–41)
BUN: 54 mg/dL — ABNORMAL HIGH (ref 6–20)
CALCIUM: 8.7 mg/dL — AB (ref 8.9–10.3)
CO2: 22 mmol/L (ref 22–32)
CREATININE: 2.17 mg/dL — AB (ref 0.61–1.24)
Chloride: 109 mmol/L (ref 101–111)
GFR calc non Af Amer: 31 mL/min — ABNORMAL LOW (ref >60)
GFR, EST AFRICAN AMERICAN: 36 mL/min — AB (ref >60)
Glucose, Bld: 120 mg/dL — ABNORMAL HIGH (ref 65–99)
Potassium: 4.4 mmol/L (ref 3.5–5.1)
SODIUM: 140 mmol/L (ref 135–145)
TOTAL PROTEIN: 6.9 g/dL (ref 6.5–8.1)
Total Bilirubin: 0.4 mg/dL (ref 0.3–1.2)

## 2016-01-09 LAB — URINALYSIS, ROUTINE W REFLEX MICROSCOPIC
Bilirubin Urine: NEGATIVE
Glucose, UA: NEGATIVE mg/dL
HGB URINE DIPSTICK: NEGATIVE
Ketones, ur: NEGATIVE mg/dL
Leukocytes, UA: NEGATIVE
Nitrite: NEGATIVE
Protein, ur: >300 mg/dL — AB
SPECIFIC GRAVITY, URINE: 1.016 (ref 1.005–1.030)
pH: 5 (ref 5.0–8.0)

## 2016-01-09 LAB — I-STAT TROPONIN, ED: TROPONIN I, POC: 0.05 ng/mL (ref 0.00–0.08)

## 2016-01-09 LAB — URINE MICROSCOPIC-ADD ON

## 2016-01-09 LAB — I-STAT CG4 LACTIC ACID, ED: LACTIC ACID, VENOUS: 1.77 mmol/L (ref 0.5–2.0)

## 2016-01-09 LAB — BRAIN NATRIURETIC PEPTIDE: B Natriuretic Peptide: 500.3 pg/mL — ABNORMAL HIGH (ref 0.0–100.0)

## 2016-01-09 MED ORDER — FUROSEMIDE 10 MG/ML IJ SOLN
40.0000 mg | Freq: Once | INTRAMUSCULAR | Status: AC
  Administered 2016-01-09: 40 mg via INTRAVENOUS
  Filled 2016-01-09: qty 4

## 2016-01-09 MED ORDER — LORAZEPAM 2 MG/ML IJ SOLN: 0.5000 mg | Freq: Once | INTRAMUSCULAR | Status: DC

## 2016-01-09 MED ORDER — HYDRALAZINE HCL 20 MG/ML IJ SOLN
5.0000 mg | Freq: Four times a day (QID) | INTRAMUSCULAR | Status: DC | PRN
  Administered 2016-01-09 – 2016-01-10 (×2): 5 mg via INTRAVENOUS
  Filled 2016-01-09 (×2): qty 1

## 2016-01-09 MED ORDER — NITROGLYCERIN 0.4 MG SL SUBL
0.4000 mg | SUBLINGUAL_TABLET | SUBLINGUAL | Status: AC | PRN
  Administered 2016-01-09 (×3): 0.4 mg via SUBLINGUAL
  Filled 2016-01-09: qty 1

## 2016-01-09 MED ORDER — SODIUM CHLORIDE 0.9 % IV SOLN
Freq: Once | INTRAVENOUS | Status: AC
  Administered 2016-01-09: 19:00:00 via INTRAVENOUS

## 2016-01-09 MED ORDER — PIPERACILLIN-TAZOBACTAM 3.375 G IVPB 30 MIN
3.3750 g | Freq: Once | INTRAVENOUS | Status: AC
  Administered 2016-01-09: 3.375 g via INTRAVENOUS
  Filled 2016-01-09: qty 50

## 2016-01-09 MED ORDER — INSULIN ASPART 100 UNIT/ML ~~LOC~~ SOLN
0.0000 [IU] | Freq: Every day | SUBCUTANEOUS | Status: DC
  Administered 2016-01-10: 2 [IU] via SUBCUTANEOUS
  Administered 2016-01-11: 3 [IU] via SUBCUTANEOUS

## 2016-01-09 MED ORDER — DILTIAZEM HCL ER 60 MG PO CP12
120.0000 mg | ORAL_CAPSULE | Freq: Two times a day (BID) | ORAL | Status: DC
  Administered 2016-01-09 – 2016-01-15 (×12): 120 mg via ORAL
  Filled 2016-01-09 (×14): qty 2

## 2016-01-09 MED ORDER — VANCOMYCIN HCL 10 G IV SOLR
1250.0000 mg | INTRAVENOUS | Status: DC
  Administered 2016-01-10: 1250 mg via INTRAVENOUS
  Filled 2016-01-09: qty 1250

## 2016-01-09 MED ORDER — LORAZEPAM 2 MG/ML IJ SOLN
0.5000 mg | Freq: Once | INTRAMUSCULAR | Status: AC
Start: 2016-01-09 — End: 2016-01-09
  Administered 2016-01-09: 0.5 mg via INTRAVENOUS
  Filled 2016-01-09: qty 1

## 2016-01-09 MED ORDER — VANCOMYCIN HCL 10 G IV SOLR
1750.0000 mg | Freq: Once | INTRAVENOUS | Status: AC
  Administered 2016-01-09: 1750 mg via INTRAVENOUS
  Filled 2016-01-09: qty 750

## 2016-01-09 MED ORDER — FUROSEMIDE 10 MG/ML IJ SOLN
80.0000 mg | Freq: Once | INTRAMUSCULAR | Status: AC
  Administered 2016-01-10: 80 mg via INTRAVENOUS
  Filled 2016-01-09: qty 8

## 2016-01-09 MED ORDER — INSULIN ASPART 100 UNIT/ML ~~LOC~~ SOLN
0.0000 [IU] | Freq: Three times a day (TID) | SUBCUTANEOUS | Status: DC
  Administered 2016-01-10: 2 [IU] via SUBCUTANEOUS
  Administered 2016-01-10 – 2016-01-11 (×3): 3 [IU] via SUBCUTANEOUS
  Administered 2016-01-11: 5 [IU] via SUBCUTANEOUS
  Administered 2016-01-11: 3 [IU] via SUBCUTANEOUS
  Administered 2016-01-12: 8 [IU] via SUBCUTANEOUS
  Administered 2016-01-12: 5 [IU] via SUBCUTANEOUS

## 2016-01-09 MED ORDER — SODIUM CHLORIDE 0.9% FLUSH
3.0000 mL | Freq: Two times a day (BID) | INTRAVENOUS | Status: DC
  Administered 2016-01-09 – 2016-01-13 (×8): 3 mL via INTRAVENOUS

## 2016-01-09 MED ORDER — INSULIN DETEMIR 100 UNIT/ML ~~LOC~~ SOLN: 25.0000 [IU] | Freq: Every day | SUBCUTANEOUS | Status: DC

## 2016-01-09 MED ORDER — DEXTROSE 50 % IV SOLN
INTRAVENOUS | Status: AC
  Administered 2016-01-09 – 2016-01-10 (×2): 25 mL
  Filled 2016-01-09: qty 50

## 2016-01-09 MED ORDER — PIPERACILLIN-TAZOBACTAM 3.375 G IVPB
3.3750 g | Freq: Three times a day (TID) | INTRAVENOUS | Status: DC
Start: 2016-01-10 — End: 2016-01-11
  Administered 2016-01-10 – 2016-01-11 (×4): 3.375 g via INTRAVENOUS
  Filled 2016-01-09 (×4): qty 50

## 2016-01-09 MED ORDER — INSULIN DETEMIR 100 UNIT/ML ~~LOC~~ SOLN
15.0000 [IU] | Freq: Every day | SUBCUTANEOUS | Status: DC
  Filled 2016-01-09: qty 0.15

## 2016-01-09 MED ORDER — ENOXAPARIN SODIUM 40 MG/0.4ML ~~LOC~~ SOLN
40.0000 mg | Freq: Every day | SUBCUTANEOUS | Status: DC
  Administered 2016-01-09 – 2016-01-11 (×3): 40 mg via SUBCUTANEOUS
  Filled 2016-01-09 (×3): qty 0.4

## 2016-01-09 NOTE — ED Notes (Signed)
Call Respiratory regarding Bipap order.

## 2016-01-09 NOTE — ED Notes (Signed)
EDP at bedside  

## 2016-01-09 NOTE — H&P (Signed)
History and Physical  Patient Name: Clayton [name]     WPV:[id number]    DOB: 1951/01/02    DOA: 01/09/2016 Referring physician: Deno Etienne, MD PCP: PROVIDER NOT IN SYSTEM      Chief Complaint: Dyspnea  HPI: [name] is a 65 y.o. male with a past medical history significant for IDDM, CKD stage III, and HTN who presents with hypoxic respiratory failure from SNF.  The patient has been admitted to the hospital numerous times in the last 3 months. Most recently he was admitted for HCAP and CHF from 1/7 to 1/15, aggressively diuresed, treated with cefepime for 7 days and then discharged to SNF.  Of note, the patient was lethargic when I examined him, and only able to provide limited history.    I called the nursing home, who stated that the patient had been there since discharge, alert and responsive, 1 assist for all ADLs, resistant to all cares, propel himself in a wheelchair, and with normal blood pressure and no fever on all recorded vital signs in the last week. Today he he was alert and oriented but had an episode of incontinence early in the day and subsequently stated that he had stomach pain and malaise. When evaluated by nursing staff he was found to be hypoxic in the 70s, and so he was sent to the ER.  In the ED, he was hypoxic in the 70s and breathing rapidly, but refused both BiPAP and intubation. He had a low-grade fever and tachycardia, a chest x-ray that showed bilateral infiltrates and marked LE swelling and JVD.  Cr 2.17, WBC 11.8K, and BNP 500.  Lactate normal.  AST/Alk phos minimally elevated and patient complaining of diffuse abdominal discomfort.  Lasix and broad spectrum antibiotics were administered and TRH were asked to evaluate for admission.     Review of Systems:  Patient is a poor historian.  Pt complains of fevers and chills all week, abdominal discomfort, swelling in the legs.  All other systems were attempted to review, but were negative or patient unable to  answer.    Allergies  Allergen Reactions  . Latex Rash    Prior to Admission medications   Medication Sig Start Date End Date Taking? Authorizing Provider  diltiazem (CARDIZEM SR) 120 MG 12 hr capsule Take 1 capsule (120 mg total) by mouth every 12 (twelve) hours. 12/05/15  Yes Robbie Lis, MD  glyBURIDE (DIABETA) 5 MG tablet Take 2 tablets (10 mg total) by mouth 2 (two) times daily with a meal. 12/05/15  Yes Robbie Lis, MD  insulin aspart (NOVOLOG) 100 UNIT/ML injection 0-15 Units, Subcutaneous, 3 times daily with meals CBG < 70: implement hypoglycemia protocol CBG 70 - 120: 0 units CBG 121 - 150: 2 units CBG 151 - 200: 3 units CBG 201 - 250: 5 units CBG 251 - 300: 8 units CBG 301 - 350: 11 units CBG 351 - 400: 15 units CBG > 400: call MD 12/24/15  Yes Shanker Kristeen Mans, MD  insulin detemir (LEVEMIR) 100 UNIT/ML injection Inject 0.25 mLs (25 Units total) into the skin daily. 12/24/15  Yes Shanker Kristeen Mans, MD  ferrous sulfate 325 (65 FE) MG tablet Take 1 tablet (325 mg total) by mouth daily with breakfast. Patient not taking: Reported on 12/17/2015 12/13/15   Lavina Hamman, MD  furosemide (LASIX) 40 MG tablet Take 1.5 tablets (60 mg total) by mouth 2 (two) times daily. 12/24/15   Shanker Kristeen Mans, MD  guaiFENesin (  MUCINEX) 600 MG 12 hr tablet Take 1 tablet (600 mg total) by mouth 2 (two) times daily as needed. 12/24/15   Shanker Kristeen Mans, MD  hydrALAZINE (APRESOLINE) 50 MG tablet Take 0.5 tablets (25 mg total) by mouth 3 (three) times daily. 12/24/15   Shanker Kristeen Mans, MD  ipratropium-albuterol (DUONEB) 0.5-2.5 (3) MG/3ML SOLN Take 3 mLs by nebulization every 4 (four) hours as needed. 12/24/15   Shanker Kristeen Mans, MD  potassium chloride (K-DUR) 10 MEQ tablet Take 2 tablets (20 mEq total) by mouth daily. 12/24/15   Shanker Kristeen Mans, MD    Past Medical History  Diagnosis Date  . CKD (chronic kidney disease), stage III   . Anemia   . HTN (hypertension)   . Type II diabetes  mellitus ([place])   . DVT (deep venous thrombosis) (HCC)     LLE  . Pneumonia 12/06/2015  . GERD (gastroesophageal reflux disease)   . Migraine     "a few times/month" (12/06/2015)  . Depression     Past Surgical History  Procedure Laterality Date  . No past surgeries      Family history: family history includes Diabetes Mellitus II in his father and mother; Stroke in his maternal grandfather.  Social History: Patient lives at a SNF since his last hospitlization discharge 2 weeks ago.  Before that he was living in a boarding house, using cocaine intermittently and alcohol as well.  He smokes.  He uses a wheelchair at his SNF, but prior to that walked.        Physical Exam: BP 161/79 mmHg  Pulse 102  Temp(Src) 98.9 F (37.2 C) (Oral)  Resp 35  Ht _0  (1.803 m)  Wt 87.091 kg (192 lb)  BMI 26.79 kg/m2  SpO2 94% General appearance: Adult male, eyes closed and in distress from dyspnea and malaise.   Eyes: Anicteric, conjunctiva pink, lids and lashes normal.     ENT: No nasal deformity, discharge, or epistaxis.  Oral mucosa dry.     Lymph: No cervical or supraclavicular lymphadenopathy. Skin: Warm and flushed.   Cardiac: Tachycardic, regular, nl S1-S2, no murmurs appreciated.  Capillary refill is brisk.  No JVD.  Marked symmetric pitting bilateral LE edema to thighs.  Radial and DP pulses 2+ and symmetric and bounding. Respiratory: Tachypnea. Coarse expiratory breath sounds.  No focal rales. Abdomen: Abdomen soft without rigidity. Somewhat distended. Mild TTP, diffuse without guarding. Holding belly.  No ascites.   MSK: No deformities or effusions. Neuro: Eyes closed.  Responds to voice.  States he has not been in rehab or SNF since discharge, which is incorrect.  No dysarthria.  Moves arms symmetrically, globally weak.  Wiggles toes.   Psych: Unable to assess.      Labs on Admission:  The metabolic panel shows sodium, potassium, and bicarbonate normal. Serum creatinine 2.17  mg/dL, last August was 1.33 mg/dL. Anion gap normal. The AST and alkaline phosphatase are minimally elevated. The albumin is very low. Urinalysis shows proteinuria. Troponin negative. BNP 500 pg per mL. Blood cultures pending. The complete blood count shows leukocytosis 11.8 K/uL, hemoglobin 8.2 g/dL normocytic, down from 11 g/dL..   Radiological Exams on Admission: Personally reviewed: Dg Chest Port 1 View 01/09/2016   Bilateral opacities, either resolving pneumonia or CHF.      Assessment/Plan 1. Acute hypoxic respiratory failure:  The differential includes CHF, recurrent pneumonia, PE.  PE doubted given age-normal d-dimer.   -Furosemide 80 mg IV at midnight and continue tomorrow -  Continue NRB supplemental O2 -Escalate to BiPAP if needed -Will cover empirically with vancomycin and piperacillin-tazobactam to cover possible PNA or intraabdominal source (cholangitis, abscess, perforation) -Follow blood culture -Add on urine culture -CT abdomen and pelvis without contrast to evaluate abdominal pain -Procalcitonin is ordered   2. AKI on CKD:  Proteinuria with hypoalbuminemia, no hematuria and ?nephrotic syndrome -Diuresis for CHF -Trend BMP -Check protein-creatinine ratio to quantify proteinuria  3. Abdominal pain and slightly elevated AST and alkaline phosphatase:  The differential includes mesenteric ischemia, cholangitis, cholecystitis, although pain is nonfocal. -CT abdomen and pelvis without contrast -Trend LFT  4. IDDM:  Normoglycemic at admission, but hypoglycemic on arrival to floor, leading to obtundation. -Continue home levemir, decreased dose for now -Sliding scale corrections -Hold oral medications  5. HTN and hypertensive urgency:  -Continue home diltiazem -Hydralazine 5 mg IV PRN for elevated BP  6. History of alcohol withdrawal and cocaine abuse:  In facility since last hospitalization, doubt withdrawal symptoms.      DVT PPx: Lovenox Diet:  Cardiac, diabetic Consultants: None Code Status: Full Family Communication: None  Medical decision making: What exists of the patient's previous chart was reviewed in depth and the case was discussed with Dr. Tyrone Nine.  Also, records from the [facility] were discussed over the phone with [facility]. Patient seen 10:01 PM on 01/09/2016.  Disposition Plan:  I recommend admission to Step down.  Clinical condition: guarded and potential for deterioration.  Anticipate diuresis for now, treatment with antibiotics and following culture data. Further disposition pending clinical course and results of CT, follow up labs.      Edwin Dada [facility] Pager [phone]

## 2016-01-09 NOTE — ED Notes (Signed)
Bed: WG95 Expected date:  Expected time:  Means of arrival:  Comments: EMS- SOB/headache

## 2016-01-09 NOTE — Progress Notes (Signed)
Pharmacy Antibiotic Note  Clayton [name] is a 65 y.o. male admitted on 01/09/2016 with pneumonia/bacteremia/intra-abdominal infection?  Pharmacy has been consulted for Zosyn/Vancomycin dosing.  Plan: Vancomycin 1250 IV every 24 hours.  Goal trough 15-20 mcg/mL. Zosyn 3.375g IV q8h (4 hour infusion).  Height:  (180.3 cm) Weight: 192 lb (87.091 kg) IBW/kg (Calculated) : 75.3  Temp (24hrs), Avg:98.7 F (37.1 C), Min:97.9 F (36.6 C), Max:99.4 F (37.4 C)   Recent Labs Lab 01/09/16 1559 01/09/16 1651 01/09/16 1901  WBC 11.8*  --   --   CREATININE 2.17* 2.00*  --   LATICACIDVEN  --   --  1.77    Estimated Creatinine Clearance: 40.8 mL/min (by C-G formula based on Cr of 2).    Allergies  Allergen Reactions  . Latex Rash    Antimicrobials this admission: 1/31 zosyn >>  1/31  vancomycin >>   Dose adjustments this admission:   Microbiology results: BCx:   UCx:  Sputum:  MRSA PCR: Thank you for allowing pharmacy to be a part of this patient's care.  Lorenza Evangelist 01/09/2016 10:46 PM

## 2016-01-09 NOTE — ED Notes (Signed)
3cc in yellow blood culture tube from IV Adela Lank)

## 2016-01-09 NOTE — Progress Notes (Signed)
Patient noted to have been seen in the ED 14 times with 8 admissions within the last six months.  Patient recently admitted and discharged from hospital from 01/07 to 01/15.  Patient was discharged to [facility] for short term rehab.

## 2016-01-09 NOTE — ED Notes (Signed)
REFUSED BIPAP AT PRESENT

## 2016-01-09 NOTE — ED Provider Notes (Signed)
CSN: [id number]     Arrival date & time 01/09/16  1522 History   First MD Initiated Contact with Patient 01/09/16 1534     Chief Complaint  Patient presents with  . Shortness of Breath  . Respiratory Distress     (Consider location/radiation/quality/duration/timing/severity/associated sxs/prior Treatment) Patient is a 65 y.o. male presenting with shortness of breath. The history is provided by the patient and the EMS personnel.  Shortness of Breath Severity:  Severe Onset quality:  Gradual Duration:  2 days Timing:  Constant Progression:  Worsening Chronicity:  Recurrent Relieved by:  Nothing Worsened by:  Nothing tried Ineffective treatments:  None tried Associated symptoms: chest pain   Associated symptoms: no abdominal pain, no fever, no headaches, no rash and no vomiting     65 yo M with a chief complaint shortness of breath. This been worsening over the past couple days.Patient denies any cough congestion fevers. Patient has had some chest pain which feels like a pressure within the substernal region. Patient denies any radiation. Nothing seems to make this better or worse. Patient is not willing to share further history with me. Patient states that he is told the EMS providers and I should be able to figure out from there.  Past Medical History  Diagnosis Date  . CKD (chronic kidney disease), stage III   . Anemia   . HTN (hypertension)   . Type II diabetes mellitus (HCC)   . DVT (deep venous thrombosis) (HCC)     LLE  . Pneumonia 12/06/2015  . GERD (gastroesophageal reflux disease)   . Migraine     "a few times/month" (12/06/2015)  . Depression    Past Surgical History  Procedure Laterality Date  . No past surgeries     Family History  Problem Relation Age of Onset  . Diabetes Mellitus II Mother   . Diabetes Mellitus II Father   . Stroke Maternal Grandfather    Social History  Substance Use Topics  . Smoking status: Current Every Day Smoker -- 0.25  packs/day for 48 years    Types: Cigarettes  . Smokeless tobacco: Never Used  . Alcohol Use: Yes    Review of Systems  Constitutional: Negative for fever and chills.  HENT: Negative for congestion and facial swelling.   Eyes: Negative for discharge and visual disturbance.  Respiratory: Positive for shortness of breath.   Cardiovascular: Positive for chest pain. Negative for palpitations.  Gastrointestinal: Negative for vomiting, abdominal pain and diarrhea.  Musculoskeletal: Negative for myalgias and arthralgias.  Skin: Negative for color change and rash.  Neurological: Negative for tremors, syncope and headaches.  Psychiatric/Behavioral: Negative for confusion and dysphoric mood.      Allergies  Latex  Home Medications   Prior to Admission medications   Medication Sig Start Date End Date Taking? Authorizing Provider  diltiazem (CARDIZEM SR) 120 MG 12 hr capsule Take 1 capsule (120 mg total) by mouth every 12 (twelve) hours. 12/05/15  Yes Alison Murray, MD  glyBURIDE (DIABETA) 5 MG tablet Take 2 tablets (10 mg total) by mouth 2 (two) times daily with a meal. 12/05/15  Yes Alison Murray, MD  insulin aspart (NOVOLOG) 100 UNIT/ML injection 0-15 Units, Subcutaneous, 3 times daily with meals CBG < 70: implement hypoglycemia protocol CBG 70 - 120: 0 units CBG 121 - 150: 2 units CBG 151 - 200: 3 units CBG 201 - 250: 5 units CBG 251 - 300: 8 units CBG 301 - 350: 11 units CBG 351 -  400: 15 units CBG > 400: call MD 12/24/15  Yes Shanker Levora Dredge, MD  insulin detemir (LEVEMIR) 100 UNIT/ML injection Inject 0.25 mLs (25 Units total) into the skin daily. 12/24/15  Yes Shanker Levora Dredge, MD  ferrous sulfate 325 (65 FE) MG tablet Take 1 tablet (325 mg total) by mouth daily with breakfast. Patient not taking: Reported on 12/17/2015 12/13/15   Rolly Salter, MD  furosemide (LASIX) 40 MG tablet Take 1.5 tablets (60 mg total) by mouth 2 (two) times daily. 12/24/15   Shanker Levora Dredge, MD   guaiFENesin (MUCINEX) 600 MG 12 hr tablet Take 1 tablet (600 mg total) by mouth 2 (two) times daily as needed. 12/24/15   Shanker Levora Dredge, MD  hydrALAZINE (APRESOLINE) 50 MG tablet Take 0.5 tablets (25 mg total) by mouth 3 (three) times daily. 12/24/15   Shanker Levora Dredge, MD  ipratropium-albuterol (DUONEB) 0.5-2.5 (3) MG/3ML SOLN Take 3 mLs by nebulization every 4 (four) hours as needed. 12/24/15   Shanker Levora Dredge, MD  potassium chloride (K-DUR) 10 MEQ tablet Take 2 tablets (20 mEq total) by mouth daily. 12/24/15   Shanker Levora Dredge, MD   BP 161/79 mmHg  Pulse 102  Temp(Src) 98.9 F (37.2 C) (Oral)  Resp 35  Ht 5\' 11"  (1.803 m)  Wt 192 lb (87.091 kg)  BMI 26.79 kg/m2  SpO2 94% Physical Exam  Constitutional: He is oriented to person, place, and time. He appears well-developed and well-nourished.  HENT:  Head: Normocephalic and atraumatic.  Eyes: EOM are normal. Pupils are equal, round, and reactive to light.  Neck: Normal range of motion. Neck supple. No JVD present.  Cardiovascular: Regular rhythm.  Tachycardia present.  Exam reveals no gallop, no S3 and no friction rub.   No murmur heard. Pulses:      Dorsalis pedis pulses are 2+ on the right side, and 2+ on the left side.  bilateral extremity edema +3 up to the tibial tuberosity.  Pulmonary/Chest: He is in respiratory distress. He has no wheezes. He has rhonchi. He exhibits no tenderness.  Diffuse rhonchi which may be transmitted upper airway sounds.  Abdominal: He exhibits no distension. There is no rebound and no guarding.  Musculoskeletal: Normal range of motion.  Neurological: He is alert and oriented to person, place, and time.  Skin: No rash noted. No pallor.  Psychiatric: He has a normal mood and affect. His behavior is normal.  Nursing note and vitals reviewed.   ED Course  Procedures (including critical care time) Labs Review Labs Reviewed  CBC WITH DIFFERENTIAL/PLATELET - Abnormal; Notable for the following:     WBC 11.8 (*)    RBC 2.88 (*)    Hemoglobin 8.2 (*)    HCT 25.5 (*)    Neutro Abs 10.1 (*)    All other components within normal limits  COMPREHENSIVE METABOLIC PANEL - Abnormal; Notable for the following:    Glucose, Bld 120 (*)    BUN 54 (*)    Creatinine, Ser 2.17 (*)    Calcium 8.7 (*)    Albumin 2.7 (*)    AST 49 (*)    Alkaline Phosphatase 187 (*)    GFR calc non Af Amer 31 (*)    GFR calc Af Amer 36 (*)    All other components within normal limits  BLOOD GAS, ARTERIAL - Abnormal; Notable for the following:    pCO2 arterial 33.0 (*)    Acid-base deficit 3.5 (*)    All other  components within normal limits  BRAIN NATRIURETIC PEPTIDE - Abnormal; Notable for the following:    B Natriuretic Peptide 500.3 (*)    All other components within normal limits  URINALYSIS, ROUTINE W REFLEX MICROSCOPIC (NOT AT [facility]) - Abnormal; Notable for the following:    APPearance HAZY (*)    Protein, ur >300 (*)    All other components within normal limits  BLOOD GAS, ARTERIAL - Abnormal; Notable for the following:    pCO2 arterial 31.0 (*)    Acid-base deficit 3.3 (*)    All other components within normal limits  URINE MICROSCOPIC-ADD ON - Abnormal; Notable for the following:    Squamous Epithelial / LPF 0-5 (*)    Bacteria, UA FEW (*)    Casts HYALINE CASTS (*)    All other components within normal limits  I-STAT CHEM 8, ED - Abnormal; Notable for the following:    BUN 47 (*)    Creatinine, Ser 2.00 (*)    Glucose, Bld 113 (*)    Hemoglobin 8.5 (*)    HCT 25.0 (*)    All other components within normal limits  CULTURE, BLOOD (ROUTINE X 2)  CULTURE, BLOOD (ROUTINE X 2)  MRSA PCR SCREENING  I-STAT TROPOININ, ED  I-STAT CG4 LACTIC ACID, ED  I-STAT CG4 LACTIC ACID, ED    Imaging Review Dg Chest Port 1 View  01/09/2016  CLINICAL DATA:  Shortness of breath and headache today. EXAM: PORTABLE CHEST 1 VIEW COMPARISON:  12/22/2015. FINDINGS: The heart is mildly enlarged but stable. The left  IJ catheter has been removed. There is a predominantly perihilar pattern of asymmetric airspace disease which could be asymmetric pulmonary edema or possibly bilateral infiltrates. There is also a left pleural effusion. The bony thorax is intact. IMPRESSION: Asymmetric bilateral airspace process, pulmonary edema versus infiltrates. Small left effusion. Electronically Signed   By: Rudie Meyer M.D.   On: 01/09/2016 16:49   I have personally reviewed and evaluated these images and lab results as part of my medical decision-making.   EKG Interpretation None       Procedure note: Ultrasound Guided Peripheral IV Ultrasound guided peripheral 1.88 inch angiocath IV placement performed by me. Indications: Nursing unable to place IV. Details: The antecubital fossa and upper arm were evaluated with a multifrequency linear probe. Patent brachial veins were noted. 1 attempt was made to cannulate a vein under realtime US guidance with successful cannulation of the vein and catheter placement. There is return of non-pulsatile dark red blood. The patient tolerated the procedure well without complications. Images archived electronically.  CPT codes: 46962 and [phone]  Procedure note: Ultrasound Guided Peripheral IV Ultrasound guided peripheral 1.88 inch angiocath IV placement performed by me. Indications: Nursing unable to place IV. Details: The antecubital fossa and upper arm were evaluated with a multifrequency linear probe. Patent brachial veins were noted. 1 attempt was made to cannulate a vein under realtime US guidance with successful cannulation of the vein and catheter placement. There is return of non-pulsatile dark red blood. The patient tolerated the procedure well without complications. Images archived electronically.  CPT codes: 13244 and [phone]  MDM   Final diagnoses:  Acute pulmonary edema (HCC)  HCAP (healthcare-associated pneumonia)  Acute respiratory failure with hypoxia [facility])    65 yo M  with a chief complaint shortness of breath. Patient is to get neck into the 40s with O2 sat in the 70s on room air. Patient is on a nonrebreather been is not like  it being over his face. Patient has diffuse rhonchi. Patient also has bilateral lower extremity edema as well as elevated blood pressure. Concern for possible pneumonia versus CHF exacerbation. Patient received DuoNeb en route by EMS without relief.   Continuing to fight staff over use of NRB.  States he needs air, had long discussion about trying bipap, will given ativan IV and attempt.   Loss of IV had to replace with ultrasound guidance.  Patient continuing to have significant tachypnea. Had a discussion with the patient about elevation. Is currently declining. Has no family contacts. Mental status is such that he is able to make his own decisions.  Patient doing much better with the nonrebreather Dorene Sorrow rigged over his face. O2 sat in the upper 90s. Continues to be tachypnea. Repeat ABGs without significant hypercapnia. As patient seems to still be clinically doing well will admit after his treatment for acute  exacerbation as well as possible multifocal pneumonia.   CRITICAL CARE Performed by: Rae Roam   Total critical care time: 80 minutes  Critical care time was exclusive of separately billable procedures and treating other patients.  Critical care was necessary to treat or prevent imminent or life-threatening deterioration.  Critical care was time spent personally by me on the following activities: development of treatment plan with patient and/or surrogate as well as nursing, discussions with consultants, evaluation of patient's response to treatment, examination of patient, obtaining history from patient or surrogate, ordering and performing treatments and interventions, ordering and review of laboratory studies, ordering and review of radiographic studies, pulse oximetry and re-evaluation of patient's  condition.  The patients results and plan were reviewed and discussed.   Any x-rays performed were independently reviewed by myself.   Differential diagnosis were considered with the presenting HPI.  Medications  LORazepam (ATIVAN) injection 0.5 mg (0.5 mg Intravenous Not Given 01/09/16 2117)  nitroGLYCERIN (NITROSTAT) SL tablet 0.4 mg (0.4 mg Sublingual Given 01/09/16 1743)  LORazepam (ATIVAN) injection 0.5 mg (0.5 mg Intravenous Given 01/09/16 1658)  vancomycin (VANCOCIN) 1,750 mg in sodium chloride 0.9 % 500 mL IVPB (1,750 mg Intravenous New Bag/Given 01/09/16 1850)  piperacillin-tazobactam (ZOSYN) IVPB 3.375 g (0 g Intravenous Stopped 01/09/16 2019)  furosemide (LASIX) injection 40 mg (40 mg Intravenous Given 01/09/16 2106)  0.9 %  sodium chloride infusion ( Intravenous New Bag/Given 01/09/16 1850)    Filed Vitals:   01/09/16 1830 01/09/16 1850 01/09/16 1949 01/09/16 2108  BP: 146/69  159/91 161/79  Pulse: 104 102 103 102  Temp:    98.9 F (37.2 C)  TempSrc:    Oral  Resp: 32 33 31 35  Height:      Weight:      SpO2: 85% 96% 95% 94%    Final diagnoses:  Acute pulmonary edema (HCC)  HCAP (healthcare-associated pneumonia)  Acute respiratory failure with hypoxia (HCC)    Admission/ observation were discussed with the admitting physician, patient and/or family and they are comfortable with the plan.     Melene Plan, DO 01/09/16 2204

## 2016-01-09 NOTE — Progress Notes (Signed)
PT violently refuses BiPAP- MD and RN aware. BiPAP left at bedside - appears to be in severe respiratory distress (however, ABG WNL at this tim- MD and RN aware).

## 2016-01-09 NOTE — ED Notes (Signed)
MD at bedside. 

## 2016-01-09 NOTE — ED Notes (Signed)
Per [facility]- Pt resides at [facility]- Pt present in Respiratory Distress. RR 40-50. HTN 186/88 EKG unremarkable. Home 02 unknown- On scene 85% on oxygen. EMS placed on  albuterol 0.5 atrovent NEB. Without relief.

## 2016-01-10 ENCOUNTER — Inpatient Hospital Stay (HOSPITAL_COMMUNITY): Payer: Medicaid Other

## 2016-01-10 DIAGNOSIS — D638 Anemia in other chronic diseases classified elsewhere: Secondary | ICD-10-CM

## 2016-01-10 DIAGNOSIS — I10 Essential (primary) hypertension: Secondary | ICD-10-CM

## 2016-01-10 DIAGNOSIS — F1414 Cocaine abuse with cocaine-induced mood disorder: Secondary | ICD-10-CM

## 2016-01-10 DIAGNOSIS — Z9114 Patient's other noncompliance with medication regimen: Secondary | ICD-10-CM

## 2016-01-10 DIAGNOSIS — E118 Type 2 diabetes mellitus with unspecified complications: Secondary | ICD-10-CM

## 2016-01-10 LAB — CBC
HCT: 23 % — ABNORMAL LOW (ref 39.0–52.0)
HEMOGLOBIN: 7.4 g/dL — AB (ref 13.0–17.0)
MCH: 28.8 pg (ref 26.0–34.0)
MCHC: 32.2 g/dL (ref 30.0–36.0)
MCV: 89.5 fL (ref 78.0–100.0)
Platelets: 271 10*3/uL (ref 150–400)
RBC: 2.57 MIL/uL — AB (ref 4.22–5.81)
RDW: 15.3 % (ref 11.5–15.5)
WBC: 10.3 10*3/uL (ref 4.0–10.5)

## 2016-01-10 LAB — GLUCOSE, CAPILLARY
GLUCOSE-CAPILLARY: 125 mg/dL — AB (ref 65–99)
GLUCOSE-CAPILLARY: 155 mg/dL — AB (ref 65–99)
GLUCOSE-CAPILLARY: 161 mg/dL — AB (ref 65–99)
GLUCOSE-CAPILLARY: 38 mg/dL — AB (ref 65–99)
GLUCOSE-CAPILLARY: 60 mg/dL — AB (ref 65–99)
GLUCOSE-CAPILLARY: 89 mg/dL (ref 65–99)
Glucose-Capillary: 80 mg/dL (ref 65–99)
Glucose-Capillary: 126 mg/dL — ABNORMAL HIGH (ref 65–99)
Glucose-Capillary: 58 mg/dL — ABNORMAL LOW (ref 65–99)
Glucose-Capillary: 229 mg/dL — ABNORMAL HIGH (ref 65–99)

## 2016-01-10 LAB — COMPREHENSIVE METABOLIC PANEL
ALT: 43 U/L (ref 17–63)
ANION GAP: 12 (ref 5–15)
AST: 44 U/L — ABNORMAL HIGH (ref 15–41)
Albumin: 2.3 g/dL — ABNORMAL LOW (ref 3.5–5.0)
Alkaline Phosphatase: 144 U/L — ABNORMAL HIGH (ref 38–126)
BUN: 52 mg/dL — ABNORMAL HIGH (ref 6–20)
CHLORIDE: 107 mmol/L (ref 101–111)
CO2: 20 mmol/L — AB (ref 22–32)
Calcium: 8.6 mg/dL — ABNORMAL LOW (ref 8.9–10.3)
Creatinine, Ser: 2.14 mg/dL — ABNORMAL HIGH (ref 0.61–1.24)
GFR calc non Af Amer: 31 mL/min — ABNORMAL LOW (ref >60)
GFR, EST AFRICAN AMERICAN: 36 mL/min — AB (ref >60)
Glucose, Bld: 162 mg/dL — ABNORMAL HIGH (ref 65–99)
POTASSIUM: 4.5 mmol/L (ref 3.5–5.1)
SODIUM: 139 mmol/L (ref 135–145)
Total Bilirubin: 0.4 mg/dL (ref 0.3–1.2)
Total Protein: 6 g/dL — ABNORMAL LOW (ref 6.5–8.1)

## 2016-01-10 LAB — PROTEIN / CREATININE RATIO, URINE
Creatinine, Urine: 86.28 mg/dL
Protein Creatinine Ratio: 4.42 mg/mg{Cre} — ABNORMAL HIGH (ref 0.00–0.15)
TOTAL PROTEIN, URINE: 381 mg/dL

## 2016-01-10 LAB — PROCALCITONIN: PROCALCITONIN: 0.95 ng/mL

## 2016-01-10 LAB — MRSA PCR SCREENING: MRSA by PCR: NEGATIVE

## 2016-01-10 LAB — PROTIME-INR
INR: 1.26 (ref 0.00–1.49)
Prothrombin Time: 15.9 seconds — ABNORMAL HIGH (ref 11.6–15.2)

## 2016-01-10 LAB — D-DIMER, QUANTITATIVE (NOT AT ARMC): D DIMER QUANT: 0.64 ug{FEU}/mL — AB (ref 0.00–0.50)

## 2016-01-10 MED ORDER — DEXTROSE-NACL 5-0.9 % IV SOLN
INTRAVENOUS | Status: DC
  Administered 2016-01-10: 02:00:00 via INTRAVENOUS

## 2016-01-10 MED ORDER — DEXTROSE 50 % IV SOLN
INTRAVENOUS | Status: AC
  Administered 2016-01-10: 25 mL
  Filled 2016-01-10: qty 50

## 2016-01-10 MED ORDER — HALOPERIDOL 1 MG PO TABS
1.0000 mg | ORAL_TABLET | Freq: Two times a day (BID) | ORAL | Status: DC
  Administered 2016-01-10 – 2016-01-15 (×11): 1 mg via ORAL
  Filled 2016-01-10 (×14): qty 1

## 2016-01-10 MED ORDER — FUROSEMIDE 10 MG/ML IJ SOLN
40.0000 mg | Freq: Two times a day (BID) | INTRAMUSCULAR | Status: DC
  Administered 2016-01-10 – 2016-01-11 (×2): 40 mg via INTRAVENOUS
  Filled 2016-01-10 (×2): qty 4

## 2016-01-10 MED ORDER — HALOPERIDOL LACTATE 5 MG/ML IJ SOLN
2.0000 mg | Freq: Once | INTRAMUSCULAR | Status: AC
  Administered 2016-01-10: 2 mg via INTRAVENOUS
  Filled 2016-01-10: qty 1

## 2016-01-10 MED ORDER — HALOPERIDOL LACTATE 5 MG/ML IJ SOLN: 2.0000 mg | Freq: Four times a day (QID) | INTRAMUSCULAR | Status: DC | PRN

## 2016-01-10 MED ORDER — LORAZEPAM 0.5 MG PO TABS
0.5000 mg | ORAL_TABLET | Freq: Two times a day (BID) | ORAL | Status: DC | PRN
  Administered 2016-01-10 – 2016-01-14 (×4): 0.5 mg via ORAL
  Filled 2016-01-10 (×5): qty 1

## 2016-01-10 NOTE — Clinical Social Work Note (Signed)
Clinical Social Work Assessment  Patient Details  Name: Clayton [name] MRN: [id number] Date of Birth: 1951/03/14  Date of referral:  01/10/16               Reason for consult:  Discharge Planning, Facility Placement                Permission sought to share information with:  Facility Art therapist granted to share information::  Yes, Verbal Permission Granted  Name::        Agency::     Relationship::     Contact Information:     Housing/Transportation Living arrangements for the past 2 months:  [place] of Information:  Patient, Facility Patient Interpreter Needed:  None Criminal Activity/Legal Involvement Pertinent to Current Situation/Hospitalization:  No - Comment as needed Significant Relationships:  Friend, Other(Comment) Doristine Bosworth) Lives with:  Facility Resident Do you feel safe going back to the place where you live?  Yes Need for family participation in patient care:  No (Coment)  Care giving concerns:  No concerns reported at this time.   Social Worker assessment / plan:  Pt admitted from [facility] on 01/09/16 with  Acute hypoxic respiratory failure. CSW met with pt to assist with d/c planning. Pt would like to return to [facility] at d/c. CSW has contacted SNF and clinicals provided. SNF will readmit when pt is stable for d/c. CSW will continue to follow to assist with d/c planning needs.  Employment status:  Unemployed Forensic scientist:  Medicaid In [place] PT Recommendations:  Not assessed at this time Information / Referral to community resources:     Patient/Family's Response to care:  Pt would like to return to [facility] at d/c.  Patient/Family's Understanding of and Emotional Response to Diagnosis, Current Treatment, and Prognosis:  " I feel good. I'd like to go back to [facility]. " CSW reassured pt that he can return to SNF when MD discharges him.  Emotional Assessment Appearance:  Appears stated  age Attitude/Demeanor/Rapport:  Other (Pt can be uncooperative at times.) Affect (typically observed):  Anxious Orientation:  Oriented to Self, Oriented to Place, Oriented to  Time, Oriented to Situation Alcohol / Substance use:  Other (H&P indicates hx of SA.) Psych involvement (Current and /or in the community):  No (Comment)  Discharge Needs  Concerns to be addressed:  Discharge Planning Concerns Readmission within the last 30 days:  No Current discharge risk:  None Barriers to Discharge:  No Barriers Identified   Loraine Maple  [phone] 01/10/2016, 3:46 PM

## 2016-01-10 NOTE — Consult Note (Signed)
[facility]iatry Consult   Reason for Consult:  Capacity evaluation Referring Physician:  Dr. Posey Pronto Patient Identification: Clayton [name] MRN:  [id number] Principal Diagnosis: Acute respiratory failure with hypoxia [facility]) Diagnosis:   Patient Active Problem List   Diagnosis Date Noted  . Acute pulmonary edema ([place]) [J81.0] 01/09/2016  . Type 2 diabetes mellitus with hyperosmolar nonketotic hyperglycemia ([place]) [E11.01] 12/16/2015  . Acute respiratory failure with hypoxia ([place]) [J96.01] 12/16/2015  . Prolonged Q-T interval on ECG [I45.81] 12/16/2015  . CKD (chronic kidney disease) [N18.9] 12/11/2015  . Diabetes mellitus with complication ([place]) [Z16.9] 12/11/2015  . Homeless [Z59.0] 12/11/2015  . Anemia of chronic disease [D63.8] 12/11/2015  . Abnormal chest x-ray [R93.8] 12/06/2015  . Hyperglycemia [R73.9] 12/04/2015  . Hypertensive urgency [I16.0] 12/04/2015  . Acute on chronic diastolic CHF (congestive heart failure) ([place]) [I50.33] 12/04/2015  . HCAP (healthcare-associated pneumonia) [J18.9]   . Cocaine abuse [F14.10] 10/08/2015  . Acute renal failure superimposed on stage 3 chronic kidney disease ([place]) [N17.9, N18.3] 08/17/2015  . Essential hypertension [I10]   . Noncompliance with medications [Z91.14] 12/13/2011  . Alcohol abuse [F10.10] 12/13/2011  . Hyponatremia [E87.1] 12/01/2011    Total Time spent with patient: 1 hour  Subjective:   Clayton [name] is a 65 y.o. male patient admitted with noncompliant with medication management and poor capacity to care for himself and history of drug abuse especially cocaine.  HPI:  Clayton [name] is a 65 y.o. male seen, chart reviewed for face to face psychiatric evaluationf for capacity evaluation. He is awake, alert, oriented to place, person and time. He has poor insight into his severity of his medical conditions and reports he has flu and sinus problem only. He denied having any other medical problem after repeated  probing. He endorses history of work related injury in 1997 and being disable with multiple bone fractures and also substance abuse from age 55 years and recently used more frequently but denied since last hospital discharge. He denied current symptoms of depression,anxiety and psychosis. He has history of agitation and aggressive behaviors and endorses having anger management issues but agree to be cooperate with staff for medical treatment. He has no supportive family or friends in [place]. He denied substance abuse rehabilitation therapy. He has no suicide or homicide ideation and psychosis.   Patient with a past medical history significant for IDDM, CKD stage III, and HTN who presents with hypoxic respiratory failure from SNF. The patient has been admitted to the hospital numerous times in the last 3 months. Most recently he was admitted for HCAP and CHF from 1/7 to 1/15, aggressively diuresed, treated with cefepime for 7 days and then discharged to SNF.  The nursing home staff stated that the patient had been there since discharge from hospital, alert and responsive, needs one assist for all ADLs, resistant to all cares, propel himself in a wheelchair, and with normal blood pressure and no fever on all recorded vital signs in the last week. He was alert and oriented but had an episode of incontinence early in the day and subsequently stated that he had stomach pain and malaise. When evaluated by nursing staff he was found to be hypoxic in the 70s, and so he was sent to the ER.  Past Psychiatric History: None reported  Risk to Self: Is patient at risk for suicide?: No Risk to Others:   Prior Inpatient Therapy:   Prior Outpatient Therapy:    Past Medical History:  Past Medical History  Diagnosis Date  . CKD (chronic kidney disease), stage III   . Anemia   . HTN (hypertension)   . Type II diabetes mellitus ([place])   . DVT (deep venous thrombosis) ([facility])     LLE  . Pneumonia 12/06/2015  .  GERD (gastroesophageal reflux disease)   . Migraine     "a few times/month" (12/06/2015)  . Depression     Past Surgical History  Procedure Laterality Date  . No past surgeries     Family History:  Family History  Problem Relation Age of Onset  . Diabetes Mellitus II Mother   . Diabetes Mellitus II Father   . Stroke Maternal Grandfather    Family Psychiatric  History: Unknown Social History:  History  Alcohol Use  . Yes     History  Drug Use  . Yes  . Special: "Crack" cocaine    Comment: 12/06/2015 "last used crack 3-4 months ago"    Social History   Social History  . Marital Status: Single    Spouse Name: N/A  . Number of Children: N/A  . Years of Education: N/A   Social History Main Topics  . Smoking status: Current Every Day Smoker -- 0.25 packs/day for 48 years    Types: Cigarettes  . Smokeless tobacco: Never Used  . Alcohol Use: Yes  . Drug Use: Yes    Special: "Crack" cocaine     Comment: 12/06/2015 "last used crack 3-4 months ago"  . Sexual Activity: Not Asked   Other Topics Concern  . None   Social History Narrative   Lives at [facility], shelter.    Additional Social History:    Allergies:   Allergies  Allergen Reactions  . Latex Rash    Labs:  Results for orders placed or performed during the hospital encounter of 01/09/16 (from the past 48 hour(s))  CBC with Differential     Status: Abnormal   Collection Time: 01/09/16  3:59 PM  Result Value Ref Range   WBC 11.8 (H) 4.0 - 10.5 K/uL   RBC 2.88 (L) 4.22 - 5.81 MIL/uL   Hemoglobin 8.2 (L) 13.0 - 17.0 g/dL   HCT 25.5 (L) 39.0 - 52.0 %   MCV 88.5 78.0 - 100.0 fL   MCH 28.5 26.0 - 34.0 pg   MCHC 32.2 30.0 - 36.0 g/dL   RDW 15.2 11.5 - 15.5 %   Platelets 304 150 - 400 K/uL   Neutrophils Relative % 85 %   Neutro Abs 10.1 (H) 1.7 - 7.7 K/uL   Lymphocytes Relative 7 %   Lymphs Abs 0.8 0.7 - 4.0 K/uL   Monocytes Relative 7 %   Monocytes Absolute 0.8 0.1 - 1.0 K/uL    Eosinophils Relative 1 %   Eosinophils Absolute 0.2 0.0 - 0.7 K/uL   Basophils Relative 0 %   Basophils Absolute 0.0 0.0 - 0.1 K/uL  Comprehensive metabolic panel     Status: Abnormal   Collection Time: 01/09/16  3:59 PM  Result Value Ref Range   Sodium 140 135 - 145 mmol/L   Potassium 4.4 3.5 - 5.1 mmol/L   Chloride 109 101 - 111 mmol/L   CO2 22 22 - 32 mmol/L   Glucose, Bld 120 (H) 65 - 99 mg/dL   BUN 54 (H) 6 - 20 mg/dL   Creatinine, Ser 2.17 (H) 0.61 - 1.24 mg/dL   Calcium 8.7 (L) 8.9 - 10.3 mg/dL   Total Protein 6.9 6.5 - 8.1 g/dL  Albumin 2.7 (L) 3.5 - 5.0 g/dL   AST 49 (H) 15 - 41 U/L   ALT 50 17 - 63 U/L   Alkaline Phosphatase 187 (H) 38 - 126 U/L   Total Bilirubin 0.4 0.3 - 1.2 mg/dL   GFR calc non Af Amer 31 (L) >60 mL/min   GFR calc Af Amer 36 (L) >60 mL/min    Comment: (NOTE) The eGFR has been calculated using the CKD EPI equation. This calculation has not been validated in all clinical situations. eGFR's persistently <60 mL/min signify possible Chronic Kidney Disease.    Anion gap 9 5 - 15  Brain natriuretic peptide     Status: Abnormal   Collection Time: 01/09/16  3:59 PM  Result Value Ref Range   B Natriuretic Peptide 500.3 (H) 0.0 - 100.0 pg/mL  Blood gas, arterial     Status: Abnormal   Collection Time: 01/09/16  4:00 PM  Result Value Ref Range   FIO2 1.00    Delivery systems NON-REBREATHER OXYGEN MASK    pH, Arterial 7.404 7.350 - 7.450   pCO2 arterial 33.0 (L) 35.0 - 45.0 mmHg   pO2, Arterial 80.6 80.0 - 100.0 mmHg   Bicarbonate 20.2 20.0 - 24.0 mEq/L   TCO2 19.2 0 - 100 mmol/L   Acid-base deficit 3.5 (H) 0.0 - 2.0 mmol/L   O2 Saturation 95.1 %   Patient temperature 98.6    Collection site RIGHT RADIAL    Drawn by [id number]    Sample type ARTERIAL DRAW    Allens test (pass/fail) PASS PASS  I-stat troponin, ED     Status: None   Collection Time: 01/09/16  4:48 PM  Result Value Ref Range   Troponin i, poc 0.05 0.00 - 0.08 ng/mL   Comment 3             Comment: Due to the release kinetics of cTnI, a negative result within the first hours of the onset of symptoms does not rule out myocardial infarction with certainty. If myocardial infarction is still suspected, repeat the test at appropriate intervals.   I-Stat Chem 8, ED     Status: Abnormal   Collection Time: 01/09/16  4:51 PM  Result Value Ref Range   Sodium 139 135 - 145 mmol/L   Potassium 4.3 3.5 - 5.1 mmol/L   Chloride 108 101 - 111 mmol/L   BUN 47 (H) 6 - 20 mg/dL   Creatinine, Ser 2.00 (H) 0.61 - 1.24 mg/dL   Glucose, Bld 113 (H) 65 - 99 mg/dL   Calcium, Ion 1.19 1.13 - 1.30 mmol/L   TCO2 21 0 - 100 mmol/L   Hemoglobin 8.5 (L) 13.0 - 17.0 g/dL   HCT 25.0 (L) 39.0 - 52.0 %  Urinalysis, Routine w reflex microscopic (not at [facility])     Status: Abnormal   Collection Time: 01/09/16  6:00 PM  Result Value Ref Range   Color, Urine YELLOW YELLOW   APPearance HAZY (A) CLEAR   Specific Gravity, Urine 1.016 1.005 - 1.030   pH 5.0 5.0 - 8.0   Glucose, UA NEGATIVE NEGATIVE mg/dL   Hgb urine dipstick NEGATIVE NEGATIVE   Bilirubin Urine NEGATIVE NEGATIVE   Ketones, ur NEGATIVE NEGATIVE mg/dL   Protein, ur >300 (A) NEGATIVE mg/dL   Nitrite NEGATIVE NEGATIVE   Leukocytes, UA NEGATIVE NEGATIVE  Urine microscopic-add on     Status: Abnormal   Collection Time: 01/09/16  6:00 PM  Result Value Ref Range   Squamous Epithelial /  LPF 0-5 (A) NONE SEEN   WBC, UA 0-5 0 - 5 WBC/hpf   RBC / HPF 0-5 0 - 5 RBC/hpf   Bacteria, UA FEW (A) NONE SEEN   Casts HYALINE CASTS (A) NEGATIVE   Urine-Other AMORPHOUS URATES/PHOSPHATES   Protein / creatinine ratio, urine     Status: Abnormal   Collection Time: 01/09/16  6:00 PM  Result Value Ref Range   Creatinine, Urine 86.28 mg/dL   Total Protein, Urine 381 mg/dL    Comment: NO NORMAL RANGE ESTABLISHED FOR THIS TEST RESULTS CONFIRMED BY MANUAL DILUTION    Protein Creatinine Ratio 4.42 (H) 0.00 - 0.15 mg/mg[Cre]    Comment: Performed at [facility]  Blood culture (routine x 2)     Status: None (Preliminary result)   Collection Time: 01/09/16  6:50 PM  Result Value Ref Range   Specimen Description BLOOD LEFT ANTECUBITAL    Special Requests IN PEDIATRIC BOTTLE 3 CC    Culture      NO GROWTH < 24 HOURS Performed at [facility]    Report Status PENDING   I-Stat CG4 Lactic Acid, ED     Status: None   Collection Time: 01/09/16  7:01 PM  Result Value Ref Range   Lactic Acid, Venous 1.77 0.5 - 2.0 mmol/L  Blood gas, arterial     Status: Abnormal   Collection Time: 01/09/16  7:10 PM  Result Value Ref Range   FIO2 1.00    Delivery systems OXYGEN MASK    pH, Arterial 7.427 7.350 - 7.450   pCO2 arterial 31.0 (L) 35.0 - 45.0 mmHg   pO2, Arterial 87.2 80.0 - 100.0 mmHg   Bicarbonate 20.0 20.0 - 24.0 mEq/L   TCO2 19.0 0 - 100 mmol/L   Acid-base deficit 3.3 (H) 0.0 - 2.0 mmol/L   O2 Saturation 96.4 %   Patient temperature 99.4    Collection site RIGHT RADIAL    Drawn by [id number]    Sample type ARTERIAL DRAW    Allens test (pass/fail) PASS PASS  MRSA PCR Screening     Status: None   Collection Time: 01/09/16  9:47 PM  Result Value Ref Range   MRSA by PCR NEGATIVE NEGATIVE    Comment:        The GeneXpert MRSA Assay (FDA approved for NASAL specimens only), is one component of a comprehensive MRSA colonization surveillance program. It is not intended to diagnose MRSA infection nor to guide or monitor treatment for MRSA infections.   Glucose, capillary     Status: Abnormal   Collection Time: 01/09/16 11:06 PM  Result Value Ref Range   Glucose-Capillary 38 (LL) 65 - 99 mg/dL   Comment 1 Notify RN   Glucose, capillary     Status: None   Collection Time: 01/09/16 11:21 PM  Result Value Ref Range   Glucose-Capillary 80 65 - 99 mg/dL  Procalcitonin - Baseline     Status: None   Collection Time: 01/09/16 11:47 PM  Result Value Ref Range   Procalcitonin 0.95 ng/mL    Comment:        Interpretation: PCT >  0.5 ng/mL and <= 2 ng/mL: Systemic infection (sepsis) is possible, but other conditions are known to elevate PCT as well. (NOTE)         ICU PCT Algorithm               Non ICU PCT Algorithm    ----------------------------     ------------------------------  PCT < 0.25 ng/mL                 PCT < 0.1 ng/mL     Stopping of antibiotics            Stopping of antibiotics       strongly encouraged.               strongly encouraged.    ----------------------------     ------------------------------       PCT level decrease by               PCT < 0.25 ng/mL       >= 80% from peak PCT       OR PCT 0.25 - 0.5 ng/mL          Stopping of antibiotics                                             encouraged.     Stopping of antibiotics           encouraged.    ----------------------------     ------------------------------       PCT level decrease by              PCT >= 0.25 ng/mL       < 80% from peak PCT        AND PCT >= 0.5 ng/mL             Continuing antibiotics                                              encouraged.       Continuing antibiotics            encouraged.    ----------------------------     ------------------------------     PCT level increase compared          PCT > 0.5 ng/mL         with peak PCT AND          PCT >= 0.5 ng/mL             Escalation of antibiotics                                          strongly encouraged.      Escalation of antibiotics        strongly encouraged.   D-dimer, quantitative (not at [facility])     Status: Abnormal   Collection Time: 01/09/16 11:47 PM  Result Value Ref Range   D-Dimer, Quant 0.64 (H) 0.00 - 0.50 ug/mL-FEU    Comment: (NOTE) At the manufacturer cut-off of 0.50 ug/mL FEU, this assay has been documented to exclude PE with a sensitivity and negative predictive value of 97 to 99%.  At this time, this assay has not been approved by the FDA to exclude DVT/VTE. Results should be correlated with clinical presentation.    Glucose, capillary     Status: Abnormal   Collection Time: 01/10/16 12:44 AM  Result Value Ref Range   Glucose-Capillary 60 (L) 65 - 99 mg/dL  Glucose, capillary  Status: None   Collection Time: 01/10/16  1:05 AM  Result Value Ref Range   Glucose-Capillary 89 65 - 99 mg/dL  Glucose, capillary     Status: Abnormal   Collection Time: 01/10/16  2:31 AM  Result Value Ref Range   Glucose-Capillary 58 (L) 65 - 99 mg/dL  Glucose, capillary     Status: Abnormal   Collection Time: 01/10/16  3:10 AM  Result Value Ref Range   Glucose-Capillary 126 (H) 65 - 99 mg/dL  Comprehensive metabolic panel     Status: Abnormal   Collection Time: 01/10/16  3:48 AM  Result Value Ref Range   Sodium 139 135 - 145 mmol/L   Potassium 4.5 3.5 - 5.1 mmol/L   Chloride 107 101 - 111 mmol/L   CO2 20 (L) 22 - 32 mmol/L   Glucose, Bld 162 (H) 65 - 99 mg/dL   BUN 52 (H) 6 - 20 mg/dL   Creatinine, Ser 2.14 (H) 0.61 - 1.24 mg/dL   Calcium 8.6 (L) 8.9 - 10.3 mg/dL   Total Protein 6.0 (L) 6.5 - 8.1 g/dL   Albumin 2.3 (L) 3.5 - 5.0 g/dL   AST 44 (H) 15 - 41 U/L   ALT 43 17 - 63 U/L   Alkaline Phosphatase 144 (H) 38 - 126 U/L   Total Bilirubin 0.4 0.3 - 1.2 mg/dL   GFR calc non Af Amer 31 (L) >60 mL/min   GFR calc Af Amer 36 (L) >60 mL/min    Comment: (NOTE) The eGFR has been calculated using the CKD EPI equation. This calculation has not been validated in all clinical situations. eGFR's persistently <60 mL/min signify possible Chronic Kidney Disease.    Anion gap 12 5 - 15  CBC     Status: Abnormal   Collection Time: 01/10/16  3:48 AM  Result Value Ref Range   WBC 10.3 4.0 - 10.5 K/uL   RBC 2.57 (L) 4.22 - 5.81 MIL/uL   Hemoglobin 7.4 (L) 13.0 - 17.0 g/dL   HCT 23.0 (L) 39.0 - 52.0 %   MCV 89.5 78.0 - 100.0 fL   MCH 28.8 26.0 - 34.0 pg   MCHC 32.2 30.0 - 36.0 g/dL   RDW 15.3 11.5 - 15.5 %   Platelets 271 150 - 400 K/uL  Protime-INR     Status: Abnormal   Collection Time: 01/10/16  3:48 AM  Result  Value Ref Range   Prothrombin Time 15.9 (H) 11.6 - 15.2 seconds   INR 1.26 0.00 - 1.49  Glucose, capillary     Status: Abnormal   Collection Time: 01/10/16  7:35 AM  Result Value Ref Range   Glucose-Capillary 125 (H) 65 - 99 mg/dL    Current Facility-Administered Medications  Medication Dose Route Frequency Provider Last Rate Last Dose  . diltiazem (CARDIZEM SR) 12 hr capsule 120 mg  120 mg Oral Q12H Edwin Dada, MD   120 mg at 01/10/16 0935  . enoxaparin (LOVENOX) injection 40 mg  40 mg Subcutaneous QHS Edwin Dada, MD   40 mg at 01/09/16 2303  . hydrALAZINE (APRESOLINE) injection 5 mg  5 mg Intravenous Q6H PRN Edwin Dada, MD   5 mg at 01/09/16 2302  . insulin aspart (novoLOG) injection 0-15 Units  0-15 Units Subcutaneous TID WC Edwin Dada, MD   2 Units at 01/10/16 0800  . insulin aspart (novoLOG) injection 0-5 Units  0-5 Units Subcutaneous QHS Edwin Dada, MD   0 Units at  01/09/16 2312  . piperacillin-tazobactam (ZOSYN) IVPB 3.375 g  3.375 g Intravenous [address], RPH   3.375 g at 01/10/16 0436  . sodium chloride flush (NS) 0.9 % injection 3 mL  3 mL Intravenous Q12H Edwin Dada, MD   3 mL at 01/10/16 0939  . vancomycin (VANCOCIN) 1,250 mg in sodium chloride 0.9 % 250 mL IVPB  1,250 mg Intravenous Q24H Dorrene German, [facility]        Musculoskeletal: Strength & Muscle Tone: decreased Gait & Station: unable to stand Patient leans: N/A  Psychiatric Specialty Exam: Review of Systems  Respiratory: Positive for shortness of breath.   Genitourinary: Positive for urgency.  Neurological: Positive for speech change and weakness.  Psychiatric/Behavioral: Positive for memory loss and substance abuse. The patient is nervous/anxious.     Blood pressure 149/74, pulse 95, temperature 98.2 F (36.8 C), temperature source Oral, resp. rate 0, height '5\' 11"'  (1.803 m), weight 98.1 kg (216 lb 4.3 oz), SpO2 95 %.Body mass index is  30.18 kg/(m^2).  General Appearance: Guarded  Eye Contact::  Good  Speech:  Slurred  Volume:  Decreased  Mood:  Depressed and Irritable  Affect:  Non-Congruent and Inappropriate  Thought Process:  Irrelevant  Orientation:  Full (Time, Place, and Person)  Thought Content:  Rumination  Suicidal Thoughts:  No  Homicidal Thoughts:  No  Memory:  Immediate;   Fair Recent;   Fair  Judgement:  Impaired  Insight:  Shallow  Psychomotor Activity:  Decreased  Concentration:  Fair  Recall:  [employer] of Knowledge:Fair  Language: Good  Akathisia:  Negative  Handed:  Right  AIMS (if indicated):     Assets:  Communication Skills Desire for Improvement Financial Resources/Insurance Leisure Time Resilience Social Support  ADL's:  Impaired  Cognition: Impaired,  Mild  Sleep:      Treatment Plan Summary: Daily contact with patient to assess and evaluate symptoms and progress in treatment and Medication management   Patient does not meet criteria for capacity to make his own medical decisions as he does not understand complex and complicated medical problems. Patient acknowledges having flu and sinus problems and staff multiple other medical problems.   Referred psychiatric social services for appropriate medical care power of attorney if needed  Appreciate psychiatric consultation and will sign off at this time Please contact [phone] or [phone] if needs further assistance   Disposition: Patient does not meet criteria for psychiatric inpatient admission. Supportive therapy provided about ongoing stressors.  Durward Parcel., MD 01/10/2016 11:49 AM

## 2016-01-10 NOTE — Progress Notes (Signed)
[facility] Progress Note  Patient: Clayton [name] BDZ:[id number]   PCP: PROVIDER NOT IN SYSTEM DOB: 1951/07/05   DOA: 01/09/2016   DOS: 01/10/2016   Date of Service: the patient was seen and examined on 01/10/2016  Subjective: Patient appears in significant respiratory distress although mentions that he is feeling better and wants to go home today. He mentions he came to the hospital because his diabetes and not because of his breathing getting worse. He denies any chest pain and denies any cough. Nutrition: Able to tolerate oral diet Activity: Bedridden due to her distress Last BM: 01/10/2016  Assessment and Plan: 1. 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 Chronic kidney disease. Hypertensive urgency. Possible healthcare associated pneumonia. She presents with worsening breathing. Requiring nonrebreather currently on 5 L of oxygen. Appears in significant respiratory distress and has bilateral crackles on examination. Continue with IV Lasix, monitor renal function, continue with antibiotics since his pro-calcitonin is elevated from his prior admission. Follow the cultures.  2. Medical noncompliance. As per the documentation the patient has not been compliant with medical treatment given in the nursing home facility. Noncompliant with medical treatment here as well and has recurrent admissions for acute on chronic CHF. Patient continues to remain in significant respiratory distress and wants to go home and mentions that he is feeling better and perfectly fine. At present I question his understanding of his medical condition and also his decision-making capacity and therefore we will consult psychiatry for further help.  3. Hypertensive urgency. Next and blood pressure appears to be stable. We'll continue current regimen.  4. Chronic kidney disease. Next 100 of functioning improving. We'll continue to monitor.  5. Diabetes mellitus  with chronic kidney disease. Hemoglobin A1c December 2016 14.7. Chronically noncompliant with medical regimen. sugar here are stable, continue current regimen  6. Abdominal pain. No acute upper moderate in the CT scan. Primarily volume overload. We'll continue to monitor.  DVT Prophylaxis: subcutaneous Heparin Nutrition: Cardiac and diabetic diet Advance goals of care discussion: full code  Brief Summary of Hospitalization:  HPI: As per the H and P dictated on admission, "Clayton [name] is a 65 y.o. male with a past medical history significant for IDDM, CKD stage III, and HTN who presents with hypoxic respiratory failure from SNF.  The patient has been admitted to the hospital numerous times in the last 3 months. Most recently he was admitted for HCAP and CHF from 1/7 to 1/15, aggressively diuresed, treated with cefepime for 7 days and then discharged to SNF. Of note, the patient was lethargic when I examined him, and only able to provide limited history.   I called the nursing home, who stated that the patient had been there since discharge, alert and responsive, 1 assist for all ADLs, resistant to all cares, propel himself in a wheelchair, and with normal blood pressure and no fever on all recorded vital signs in the last week. Today he he was alert and oriented but had an episode of incontinence early in the day and subsequently stated that he had stomach pain and malaise. When evaluated by nursing staff he was found to be hypoxic in the 70s, and so he was sent to the ER.  In the ED, he was hypoxic in the 70s and breathing rapidly, but refused both BiPAP and intubation. He had a low-grade fever and tachycardia, a chest x-ray that showed bilateral infiltrates and marked LE swelling and JVD. Cr 2.17, WBC 11.8K,  and BNP 500. Lactate normal. AST/Alk phos minimally elevated and patient complaining of diffuse abdominal discomfort. Lasix and broad spectrum antibiotics were administered and  TRH were asked to evaluate for admission."  Daily update, Procedures: 01/10/2016 oxygenation improved to 5 L nasal cannula Consultants: Psychiatric Antibiotics: Anti-infectives    Start     Dose/Rate Route Frequency Ordered Stop   01/10/16 1800  vancomycin (VANCOCIN) 1,250 mg in sodium chloride 0.9 % 250 mL IVPB     1,250 mg 166.7 mL/hr over 90 Minutes Intravenous Every 24 hours 01/09/16 2245     01/10/16 0400  piperacillin-tazobactam (ZOSYN) IVPB 3.375 g     3.375 g 12.5 mL/hr over 240 Minutes Intravenous Every 8 hours 01/09/16 2245     01/09/16 1800  vancomycin (VANCOCIN) 1,750 mg in sodium chloride 0.9 % 500 mL IVPB     1,750 mg 250 mL/hr over 120 Minutes Intravenous  Once 01/09/16 1749 01/09/16 2050   01/09/16 1800  piperacillin-tazobactam (ZOSYN) IVPB 3.375 g     3.375 g 100 mL/hr over 30 Minutes Intravenous  Once 01/09/16 1749 01/09/16 2019       Family Communication: no family was present at bedside, at the time of interview.   Disposition:  Expected discharge date: 01/13/2016 Barriers to safe discharge: Improvement oxygenation   Intake/Output Summary (Last 24 hours) at 01/10/16 1051 Last data filed at 01/10/16 0600  Gross per 24 hour  Intake 646.25 ml  Output   2850 ml  Net -2203.75 ml   Filed Weights   01/09/16 1745 01/09/16 2200  Weight: 87.091 kg (192 lb) 98.1 kg (216 lb 4.3 oz)    Objective: Physical Exam: Filed Vitals:   01/10/16 0000 01/10/16 0400 01/10/16 0600 01/10/16 0700  BP: 171/64 161/60 149/74   Pulse: 99 101 95   Temp: 99 F (37.2 C) 97.4 F (36.3 C)  98.2 F (36.8 C)  TempSrc: Oral Oral    Resp: 24 41 0   Height:      Weight:      SpO2: 94% 91% 95%      General: Appear in marked distress, no Rash; Oral Mucosa moist. Cardiovascular: S1 and S2 Present, no Murmur, difficult to assess JVD Respiratory: Bilateral Air entry present and bilateral Crackles, no wheezes Abdomen: Bowel Sound present, Soft and no tenderness Extremities: no Pedal  edema, no calf tenderness Neurology: Grossly no focal neuro deficit.  Data Reviewed: CBC:  Recent Labs Lab 01/09/16 1559 01/09/16 1651 01/10/16 0348  WBC 11.8*  --  10.3  NEUTROABS 10.1*  --   --   HGB 8.2* 8.5* 7.4*  HCT 25.5* 25.0* 23.0*  MCV 88.5  --  89.5  PLT 304  --  284   Basic Metabolic Panel:  Recent Labs Lab 01/09/16 1559 01/09/16 1651 01/10/16 0348  NA 140 139 139  K 4.4 4.3 4.5  CL 109 108 107  CO2 22  --  20*  GLUCOSE 120* 113* 162*  BUN 54* 47* 52*  CREATININE 2.17* 2.00* 2.14*  CALCIUM 8.7*  --  8.6*   Liver Function Tests:  Recent Labs Lab 01/09/16 1559 01/10/16 0348  AST 49* 44*  ALT 50 43  ALKPHOS 187* 144*  BILITOT 0.4 0.4  PROT 6.9 6.0*  ALBUMIN 2.7* 2.3*   No results for input(s): LIPASE, AMYLASE in the last 168 hours. No results for input(s): AMMONIA in the last 168 hours.  Cardiac Enzymes: No results for input(s): CKTOTAL, CKMB, CKMBINDEX, TROPONINI in the last 168 hours.  BNP (last 3 results)  Recent Labs  12/04/15 0422 12/16/15 1913 01/09/16 1559  BNP 557.6* 1348.3* 500.3*    CBG:  Recent Labs Lab 01/10/16 0044 01/10/16 0105 01/10/16 0231 01/10/16 0310 01/10/16 0735  GLUCAP 60* 89 58* 126* 125*    Recent Results (from the past 240 hour(s))  MRSA PCR Screening     Status: None   Collection Time: 01/09/16  9:47 PM  Result Value Ref Range Status   MRSA by PCR NEGATIVE NEGATIVE Final    Comment:        The GeneXpert MRSA Assay (FDA approved for NASAL specimens only), is one component of a comprehensive MRSA colonization surveillance program. It is not intended to diagnose MRSA infection nor to guide or monitor treatment for MRSA infections.      Studies: Ct Abdomen Pelvis Wo Contrast  01/10/2016  CLINICAL DATA:  Abdominal pain. EXAM: CT ABDOMEN AND PELVIS WITHOUT CONTRAST TECHNIQUE: Multidetector CT imaging of the abdomen and pelvis was performed following the standard protocol without IV contrast.  COMPARISON:  CT 09/17/2015 FINDINGS: Lower chest: Small to moderate bilateral pleural effusions, left greater than right. Ground-glass opacities in the included lung bases, left greater than right, suspect pulmonary edema. The heart is enlarged. Liver: No focal lesion allowing for lack contrast. Hepatobiliary: Gallbladder physiologically distended, no calcified stone. No biliary dilatation. Pancreas: Not well-defined.  No evidence pancreatitis. Spleen: Normal in size. Adrenal glands: No nodule. Kidneys: No hydronephrosis.  No localizing renal abnormality. Stomach/Bowel: Stomach is decompressed. There are no dilated or thickened small bowel loops. Small-moderate volume of stool throughout the colon without colonic wall thickening. The appendix is not seen. Vascular/Lymphatic: No retroperitoneal adenopathy. Shotty inguinal and external iliac nodes. Abdominal aorta is normal in caliber, tortuous in its course. Mild atherosclerosis Reproductive: Prostate gland normal in size. Bladder: Decompressed by Foley catheter, however diffuse wall thickening is seen. Other: Diffuse whole body wall edema. Fat in both inguinal canals. No free air, free fluid, or intra-abdominal fluid collection. Musculoskeletal: There are no acute or suspicious osseous abnormalities. Bilateral L5 pars interarticularis defects without listhesis. IMPRESSION: 1. Findings consistent with fluid overload with bilateral pleural effusions and whole body wall edema. 2. Diffuse bladder wall thickening, correlation with urinalysis recommended to exclude urinary tract infection. Electronically Signed   By: Jeb Levering M.D.   On: 01/10/2016 02:32   Dg Chest Port 1 View  01/09/2016  CLINICAL DATA:  Shortness of breath and headache today. EXAM: PORTABLE CHEST 1 VIEW COMPARISON:  12/22/2015. FINDINGS: The heart is mildly enlarged but stable. The left IJ catheter has been removed. There is a predominantly perihilar pattern of asymmetric airspace disease  which could be asymmetric pulmonary edema or possibly bilateral infiltrates. There is also a left pleural effusion. The bony thorax is intact. IMPRESSION: Asymmetric bilateral airspace process, pulmonary edema versus infiltrates. Small left effusion. Electronically Signed   By: Marijo Sanes M.D.   On: 01/09/2016 16:49     Scheduled Meds: . diltiazem  120 mg Oral Q12H  . enoxaparin (LOVENOX) injection  40 mg Subcutaneous QHS  . insulin aspart  0-15 Units Subcutaneous TID WC  . insulin aspart  0-5 Units Subcutaneous QHS  . piperacillin-tazobactam (ZOSYN)  IV  3.375 g Intravenous Q8H  . sodium chloride flush  3 mL Intravenous Q12H  . vancomycin  1,250 mg Intravenous Q24H   Continuous Infusions:  PRN Meds: hydrALAZINE  Time spent: 30 minutes  Author: Berle Mull, MD [facility] Pager: [phone] 01/10/2016 10:51 AM  If 7PM-7AM, please contact night-coverage at [email or web address], password [facility]

## 2016-01-10 NOTE — Care Management Note (Signed)
Case Management Note  Patient Details  Name: Clayton [name] MRN: [id number] Date of Birth: 1951/09/12  Subjective/Objective:        resp failure            Action/Plan:Date: January 10, 2016 Chart reviewed for concurrent status and case management needs. Will continue to follow patient for changes and needs: Clayton [name], BSN, RN, [place]   [phone]   Expected Discharge Date:                  Expected Discharge Plan:  Skilled Nursing Facility  In-House Referral:  Clinical Social Work  Discharge planning Services  CM Consult  Post Acute Care Choice:  NA Choice offered to:  NA  DME Arranged:    DME Agency:     HH Arranged:    HH Agency:     Status of Service:  In process, will continue to follow  Medicare Important Message Given:    Date Medicare IM Given:    Medicare IM give by:    Date Additional Medicare IM Given:    Additional Medicare Important Message give by:     If discussed at Long Length of Stay Meetings, dates discussed:    Additional Comments:  Golda Acre, RN 01/10/2016, 8:48 AM

## 2016-01-10 NOTE — Progress Notes (Signed)
Pt has been co-operative for most of the day, now agitated, yelling to go home (from nursing home), hitting at staff, not able to reason with patient.  Dr Allena Katz notified. He will check with Psych, new order received for Haldol.  Haldol 2 mg IV given.

## 2016-01-10 NOTE — Plan of Care (Signed)
[facility] PLAN OF CARE NOTE  Patient: Clayton [name] UJW:[id number]   PCP: PROVIDER NOT IN SYSTEM DOB: 1951-10-13   DOA: 01/09/2016   DOS: 01/10/2016    Plan of care: Patient in the afternoon requested to leave the hospital. Patient did not offer any particular reason. Patient was explained that he has significant respiratory distress requiring 5 L of oxygen with a respiratory rate of 40s with acute on chronic CHF as well as possible healthcare associated pneumonia. Patient was also informed that psychiatrist has evaluated the patient and recommend that the "patient does not meet criteria for competency". Patient was given IV Haldol, patient requested to contact [place]. And patient is currently staying voluntarily. Based on the psychiatric evaluation, At present I would recommend that the patient cannot leave AMA due to lack of insight in his medical condition. Haldol every 6 hours as needed ordered. Social worker consult ordered for assistance with guardianship competency  Author: Lynden Oxford, MD [facility] Pager: [phone] 01/10/2016 4:40 PM   If 7PM-7AM, please contact night-coverage at [email or web address], password [facility]

## 2016-01-10 NOTE — Clinical Documentation Improvement (Signed)
Internal Medicine  Can the diagnosis of diastolic dysfunction  be further specified?    Acuity - Acute, Chronic, Acute on Chronic   Type - Systolic, Diastolic, Systolic and Diastolic  Other  Clinically Undetermined   Document any associated diagnoses/conditions   Supporting Information: H&P: Acute hypoxic respiratory failure:  The differential includes CHF, Furosemide 80 mg IV at midnight and continue tomorrow Diuresis for CHF  Please exercise your independent, professional judgment when responding. A specific answer is not anticipated or expected. Please update your documentation within the medical record to reflect your response to this query. Thank you  Thank Barrie Dunker [facility] Information [facility] [place] [phone]

## 2016-01-11 ENCOUNTER — Inpatient Hospital Stay (HOSPITAL_COMMUNITY): Payer: Medicaid Other

## 2016-01-11 DIAGNOSIS — N179 Acute kidney failure, unspecified: Secondary | ICD-10-CM

## 2016-01-11 DIAGNOSIS — I5033 Acute on chronic diastolic (congestive) heart failure: Secondary | ICD-10-CM

## 2016-01-11 DIAGNOSIS — N183 Chronic kidney disease, stage 3 (moderate): Secondary | ICD-10-CM

## 2016-01-11 DIAGNOSIS — I160 Hypertensive urgency: Secondary | ICD-10-CM

## 2016-01-11 DIAGNOSIS — J9601 Acute respiratory failure with hypoxia: Secondary | ICD-10-CM

## 2016-01-11 LAB — CBC WITH DIFFERENTIAL/PLATELET
BASOS ABS: 0 10*3/uL (ref 0.0–0.1)
Basophils Relative: 0 %
Eosinophils Absolute: 0.2 10*3/uL (ref 0.0–0.7)
Eosinophils Relative: 2 %
HEMATOCRIT: 21.9 % — AB (ref 39.0–52.0)
Hemoglobin: 7.1 g/dL — ABNORMAL LOW (ref 13.0–17.0)
LYMPHS ABS: 0.6 10*3/uL — AB (ref 0.7–4.0)
LYMPHS PCT: 8 %
MCH: 28.9 pg (ref 26.0–34.0)
MCHC: 32.4 g/dL (ref 30.0–36.0)
MCV: 89 fL (ref 78.0–100.0)
MONO ABS: 0.6 10*3/uL (ref 0.1–1.0)
Monocytes Relative: 8 %
NEUTROS ABS: 6.8 10*3/uL (ref 1.7–7.7)
Neutrophils Relative %: 82 %
Platelets: 257 10*3/uL (ref 150–400)
RBC: 2.46 MIL/uL — ABNORMAL LOW (ref 4.22–5.81)
RDW: 15.4 % (ref 11.5–15.5)
WBC: 8.3 10*3/uL (ref 4.0–10.5)

## 2016-01-11 LAB — COMPREHENSIVE METABOLIC PANEL
ALT: 37 U/L (ref 17–63)
ANION GAP: 12 (ref 5–15)
AST: 33 U/L (ref 15–41)
Albumin: 2.2 g/dL — ABNORMAL LOW (ref 3.5–5.0)
Alkaline Phosphatase: 136 U/L — ABNORMAL HIGH (ref 38–126)
BUN: 51 mg/dL — ABNORMAL HIGH (ref 6–20)
CHLORIDE: 106 mmol/L (ref 101–111)
CO2: 22 mmol/L (ref 22–32)
CREATININE: 2.12 mg/dL — AB (ref 0.61–1.24)
Calcium: 8.6 mg/dL — ABNORMAL LOW (ref 8.9–10.3)
GFR, EST AFRICAN AMERICAN: 37 mL/min — AB (ref >60)
GFR, EST NON AFRICAN AMERICAN: 32 mL/min — AB (ref >60)
Glucose, Bld: 214 mg/dL — ABNORMAL HIGH (ref 65–99)
Potassium: 4.3 mmol/L (ref 3.5–5.1)
SODIUM: 140 mmol/L (ref 135–145)
Total Bilirubin: 0.9 mg/dL (ref 0.3–1.2)
Total Protein: 6 g/dL — ABNORMAL LOW (ref 6.5–8.1)

## 2016-01-11 LAB — MAGNESIUM: Magnesium: 1.9 mg/dL (ref 1.7–2.4)

## 2016-01-11 LAB — GLUCOSE, CAPILLARY
GLUCOSE-CAPILLARY: 224 mg/dL — AB (ref 65–99)
GLUCOSE-CAPILLARY: 163 mg/dL — AB (ref 65–99)
Glucose-Capillary: 195 mg/dL — ABNORMAL HIGH (ref 65–99)
Glucose-Capillary: 280 mg/dL — ABNORMAL HIGH (ref 65–99)

## 2016-01-11 LAB — PROCALCITONIN: PROCALCITONIN: 1.42 ng/mL

## 2016-01-11 LAB — ABO/RH: ABO/RH(D): B POS

## 2016-01-11 LAB — PREPARE RBC (CROSSMATCH)

## 2016-01-11 MED ORDER — FUROSEMIDE 10 MG/ML IJ SOLN
80.0000 mg | Freq: Three times a day (TID) | INTRAMUSCULAR | Status: AC
  Administered 2016-01-11 – 2016-01-12 (×3): 80 mg via INTRAVENOUS
  Filled 2016-01-11 (×3): qty 8

## 2016-01-11 MED ORDER — FUROSEMIDE 10 MG/ML IJ SOLN: 80.0000 mg | Freq: Two times a day (BID) | INTRAMUSCULAR | Status: DC

## 2016-01-11 MED ORDER — SODIUM CHLORIDE 0.9 % IV SOLN: Freq: Once | INTRAVENOUS | Status: DC

## 2016-01-11 MED ORDER — HYDRALAZINE HCL 25 MG PO TABS
25.0000 mg | ORAL_TABLET | Freq: Three times a day (TID) | ORAL | Status: DC
  Administered 2016-01-11 – 2016-01-13 (×9): 25 mg via ORAL
  Filled 2016-01-11 (×9): qty 1

## 2016-01-11 MED ORDER — FERROUS SULFATE 325 (65 FE) MG PO TABS
325.0000 mg | ORAL_TABLET | Freq: Every day | ORAL | Status: DC
  Administered 2016-01-11 – 2016-01-15 (×5): 325 mg via ORAL
  Filled 2016-01-11 (×5): qty 1

## 2016-01-11 MED ORDER — FUROSEMIDE 10 MG/ML IJ SOLN: 40.0000 mg | Freq: Once | INTRAMUSCULAR | Status: DC

## 2016-01-11 MED ORDER — GUAIFENESIN ER 600 MG PO TB12
600.0000 mg | ORAL_TABLET | Freq: Two times a day (BID) | ORAL | Status: DC
  Administered 2016-01-11 – 2016-01-15 (×9): 600 mg via ORAL
  Filled 2016-01-11 (×9): qty 1

## 2016-01-11 NOTE — Progress Notes (Signed)
[facility] Progress Note  Patient: Clayton [name] WGN:[id number]   PCP: PROVIDER NOT IN SYSTEM DOB: Jan 05, 1951   DOA: 01/09/2016   DOS: 01/11/2016   Date of Service: the patient was seen and examined on 01/11/2016  Subjective: Yesterday the patient was trying to leave the hospital and he was explained that as per psychiatry, he has been deemed not competent to understand his medical condition, and he has been cooperative since then with the medical treatment. No evidence of agitation reported. He has been more and more dyspneic and lethargic. Denies any active pain.  Nutrition: Able to tolerate oral diet Activity: Bedridden due to distress Last BM: 01/10/2016  Assessment and Plan: 1. 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 Chronic kidney disease. Hypertensive urgency. Possible healthcare associated pneumonia. Patient's dry weight 169 pound as per 11/2015  presents with worsening breathing. Requiring nonrebreather. Appears in significant respiratory distress and has bilateral crackles on examination. Continue with IV Lasix, monitor renal function, continue with antibiotics since his pro-calcitonin is elevated from his prior admission. Follow the cultures. Appreciate input from critical care, currently recommending to increase Lasix to 80 mg 3 times a day.  2. Medical noncompliance. As per the documentation the patient has not been compliant with medical treatment even in the nursing home facility. Noncompliant with medical treatment here as well and has recurrent admissions for acute on chronic CHF. Patient continues to remain in significant respiratory distress and wants to go home and mentions that he is feeling better and perfectly fine. Appreciate psychiatric consultation. Patient does not meet criteria for capacity to make his own medical decisions  Social worker consulted to assist with further management  3. Hypertensive  urgency.  blood pressure appears to be stable. We'll continue current regimen.  4. Chronic kidney disease.  Renal function functioning improving. We'll continue to monitor.  5. Diabetes mellitus with chronic kidney disease. Hemoglobin A1c December 2016 14.7. Chronically noncompliant with medical regimen. sugar here are stable, continue current regimen  6. Abdominal pain. No acute abnormality in the CT scan. Primarily volume overload. We'll continue to monitor.  7. Anemia of chronic disease. Gradually worsening. No active bleeding reported. Currently hemoglobin 7.1. With his hypoxia initial plan was to transfuse him with 1 unit. As per discussion with critical care currently we will diurese him and reevaluate later to assess further need for transfusion, as there may be a component of hemodilution with his CHF.  DVT Prophylaxis: subcutaneous Heparin Nutrition: Cardiac and diabetic diet Advance goals of care discussion: full code  Brief Summary of Hospitalization:  HPI: As per the H and P dictated on admission, "Clayton [name] is a 65 y.o. male with a past medical history significant for IDDM, CKD stage III, and HTN who presents with hypoxic respiratory failure from SNF.  The patient has been admitted to the hospital numerous times in the last 3 months. Most recently he was admitted for HCAP and CHF from 1/7 to 1/15, aggressively diuresed, treated with cefepime for 7 days and then discharged to SNF.  I called the nursing home, who stated that the patient had been there since discharge, alert and responsive, 1 assist for all ADLs, resistant to all cares, propel himself in a wheelchair, and with normal blood pressure and no fever on all recorded vital signs in the last week. When evaluated by nursing staff he was found to be hypoxic in the 70s, and so he was sent to the ER.  In the ED, he was hypoxic in the 70s and breathing rapidly, but refused both BiPAP and intubation.TRH were asked  to evaluate for admission."  Clayton update, Procedures: 01/10/2016 psychiatric consulted 01/11/2016 critical care consulted. Consultants: Psychiatric Antibiotics: Anti-infectives    Start     Dose/Rate Route Frequency Ordered Stop   01/10/16 1800  vancomycin (VANCOCIN) 1,250 mg in sodium chloride 0.9 % 250 mL IVPB  Status:  Discontinued     1,250 mg 166.7 mL/hr over 90 Minutes Intravenous Every 24 hours 01/09/16 2245 01/11/16 1129   01/10/16 0400  piperacillin-tazobactam (ZOSYN) IVPB 3.375 g  Status:  Discontinued     3.375 g 12.5 mL/hr over 240 Minutes Intravenous Every 8 hours 01/09/16 2245 01/11/16 1129   01/09/16 1800  vancomycin (VANCOCIN) 1,750 mg in sodium chloride 0.9 % 500 mL IVPB     1,750 mg 250 mL/hr over 120 Minutes Intravenous  Once 01/09/16 1749 01/09/16 2050   01/09/16 1800  piperacillin-tazobactam (ZOSYN) IVPB 3.375 g     3.375 g 100 mL/hr over 30 Minutes Intravenous  Once 01/09/16 1749 01/09/16 2019      Family Communication: no family was present at bedside, at the time of interview.   Disposition:  Barriers to safe discharge: Improvement oxygenation   Intake/Output Summary (Last 24 hours) at 01/11/16 1355 Last data filed at 01/11/16 1045  Gross per 24 hour  Intake    470 ml  Output   2875 ml  Net  -2405 ml   Filed Weights   01/09/16 1745 01/09/16 2200 01/11/16 0300  Weight: 87.091 kg (192 lb) 98.1 kg (216 lb 4.3 oz) 96.1 kg (211 lb 13.8 oz)    Objective: Physical Exam: Filed Vitals:   01/11/16 0804 01/11/16 0810 01/11/16 1033 01/11/16 1200  BP:   153/76 154/84  Pulse: 95 94 92 91  Temp:      TempSrc:      Resp: 33 33 29 23  Height:      Weight:      SpO2: 74% 98% 97% 96%    General: Appear in severe distress, no Rash; Oral Mucosa moist. Cardiovascular: S1 and S2 Present, no Murmur, difficult to assess JVD Respiratory: Bilateral Air entry present and bilateral rhonchi and Crackles, no wheezes Abdomen: Bowel Sound present, Soft and no  tenderness Extremities: Bilateral Pedal edema, no calf tenderness  Data Reviewed: CBC:  Recent Labs Lab 01/09/16 1559 01/09/16 1651 01/10/16 0348 01/11/16 0346  WBC 11.8*  --  10.3 8.3  NEUTROABS 10.1*  --   --  6.8  HGB 8.2* 8.5* 7.4* 7.1*  HCT 25.5* 25.0* 23.0* 21.9*  MCV 88.5  --  89.5 89.0  PLT 304  --  271 257   Basic Metabolic Panel:  Recent Labs Lab 01/09/16 1559 01/09/16 1651 01/10/16 0348 01/11/16 0346  NA 140 139 139 140  K 4.4 4.3 4.5 4.3  CL 109 108 107 106  CO2 22  --  20* 22  GLUCOSE 120* 113* 162* 214*  BUN 54* 47* 52* 51*  CREATININE 2.17* 2.00* 2.14* 2.12*  CALCIUM 8.7*  --  8.6* 8.6*  MG  --   --   --  1.9   Liver Function Tests:  Recent Labs Lab 01/09/16 1559 01/10/16 0348 01/11/16 0346  AST 49* 44* 33  ALT 50 43 37  ALKPHOS 187* 144* 136*  BILITOT 0.4 0.4 0.9  PROT 6.9 6.0* 6.0*  ALBUMIN 2.7* 2.3* 2.2*   No results for input(s): LIPASE, AMYLASE  in the last 168 hours. No results for input(s): AMMONIA in the last 168 hours.  Cardiac Enzymes: No results for input(s): CKTOTAL, CKMB, CKMBINDEX, TROPONINI in the last 168 hours.  BNP (last 3 results)  Recent Labs  12/04/15 0422 12/16/15 1913 01/09/16 1559  BNP 557.6* 1348.3* 500.3*    CBG:  Recent Labs Lab 01/10/16 1117 01/10/16 1609 01/10/16 2156 01/11/16 0738 01/11/16 1133  GLUCAP 155* 161* 229* 224* 195*    Recent Results (from the past 240 hour(s))  Urine culture     Status: None (Preliminary result)   Collection Time: 01/09/16  6:00 PM  Result Value Ref Range Status   Specimen Description URINE, RANDOM  Final   Special Requests NONE  Final   Culture   Final    30,000 COLONIES/mL PROTEUS MIRABILIS Performed at [facility]    Report Status PENDING  Incomplete  Blood culture (routine x 2)     Status: None (Preliminary result)   Collection Time: 01/09/16  6:50 PM  Result Value Ref Range Status   Specimen Description BLOOD LEFT ANTECUBITAL  Final    Special Requests IN PEDIATRIC BOTTLE 3 CC  Final   Culture   Final    NO GROWTH < 24 HOURS Performed at [facility]    Report Status PENDING  Incomplete  MRSA PCR Screening     Status: None   Collection Time: 01/09/16  9:47 PM  Result Value Ref Range Status   MRSA by PCR NEGATIVE NEGATIVE Final    Comment:        The GeneXpert MRSA Assay (FDA approved for NASAL specimens only), is one component of a comprehensive MRSA colonization surveillance program. It is not intended to diagnose MRSA infection nor to guide or monitor treatment for MRSA infections.      Studies: Dg Chest Port 1 View  01/11/2016  CLINICAL DATA:  Hypoxia and pneumonia. EXAM: PORTABLE CHEST 1 VIEW COMPARISON:  01/09/2016 FINDINGS: Lungs are hypoinflated and demonstrate continued consolidation over the right upper lobe without significant change with patchy opacification over the right midlung central left lung slightly worse compatible patient's known pneumonia. No evidence of effusion. Mild stable cardiomegaly. Remainder of the exam is unchanged. IMPRESSION: Multifocal airspace process without significant change over the right upper lobe and mild worsening over the right mid to lower lung and central left lung compatible with pneumonia. Electronically Signed   By: Elberta Fortis M.D.   On: 01/11/2016 11:59     Scheduled Meds: . sodium chloride   Intravenous Once  . diltiazem  120 mg Oral Q12H  . enoxaparin (LOVENOX) injection  40 mg Subcutaneous QHS  . ferrous sulfate  325 mg Oral Q breakfast  . furosemide  80 mg Intravenous Q8H  . guaiFENesin  600 mg Oral BID  . haloperidol  1 mg Oral BID  . hydrALAZINE  25 mg Oral TID  . insulin aspart  0-15 Units Subcutaneous TID WC  . insulin aspart  0-5 Units Subcutaneous QHS  . sodium chloride flush  3 mL Intravenous Q12H   Continuous Infusions:  PRN Meds: haloperidol lactate, LORazepam  Time spent: 30 minutes  Author: Lynden Oxford, MD [facility] Pager: [phone] 01/11/2016 1:55 PM  If 7PM-7AM, please contact night-coverage at [email or web address], password [facility]

## 2016-01-11 NOTE — Progress Notes (Signed)
  Inpatient Diabetes Program Recommendations  AACE/ADA: New Consensus Statement on Inpatient Glycemic Control (2015)  Target Ranges:  Prepandial:   less than 140 mg/dL      Peak postprandial:   less than 180 mg/dL (1-2 hours)      Critically ill patients:  140 - 180 mg/dL   Review of Glycemic Control  Diabetes history: DM2 Outpatient Diabetes medications: Lantus 25 units QHS, Novolog mod s/s, glyburide 20 units bid Current orders for Inpatient glycemic control: Novolog moderate tidwc and hs  Results for SHADOE, BETHEL (MRN [id number]) as of 01/11/2016 17:39  Ref. Range 01/10/2016 11:17 01/10/2016 16:09 01/10/2016 21:56 01/11/2016 07:38 01/11/2016 11:33  Glucose-Capillary Latest Ref Range: 65-99 mg/dL 952 (H) 841 (H) 324 (H) 224 (H) 195 (H)  Results for KENDARRIUS, TANZI (MRN [id number]) as of 01/11/2016 17:39  Ref. Range 12/04/2015 11:23  Hemoglobin A1C Latest Ref Range: 4.8-5.6 % 14.7 (H)    Inpatient Diabetes Program Recommendations:    Please consider addition of low dose Lantus 10 units QHS Will speak with pt in am regarding HgbA1C results.  Will follow. Thank you. Ailene Ards, RD, LDN, CDE Inpatient Diabetes Coordinator [phone]

## 2016-01-11 NOTE — Consult Note (Signed)
PULMONARY / CRITICAL CARE MEDICINE   Name: Clayton [name] MRN: [id number] DOB: 04-17-1951    ADMISSION DATE:  01/09/2016 CONSULTATION DATE:  01/11/2016  REFERRING MD:  Posey Pronto  CHIEF COMPLAINT: Acute hypoxic respiratory failure   HISTORY OF PRESENT ILLNESS:   65 year old male sig medical history of DM, CKD stage III, Cocaine abuse, dCHF, and HTN, presented to ED on 01/09/16 from SNF with hypoxic respiratory failure found to be with oxygen saturation in the 70s. Patient has been admitted numerous times in the last 3 months (frequently refuses meds), most recently 1/7-1/15 for HCAP and CHF. On admission to ED patient was lethargic with sats in the 70s, breathing rapidly, and only able to provide a limited history, however, refused BIPAP and intubation. He had a low-grade fever and tachycardia, a chest x-ray that showed bilateral infiltrates and marked LE swelling and JVD. Cr 2.17, WBC 11.8K, and BNP 500. Lactate normal. AST/Alk phos minimally elevated and patient complaining of diffuse abdominal discomfort. Lasix and broad spectrum antibiotics were administered. PCCM to consult for continued hypoxic respiratory failure.    PAST MEDICAL HISTORY :  He  has a past medical history of CKD (chronic kidney disease), stage III; Anemia; HTN (hypertension); Type II diabetes mellitus ([place]); DVT (deep venous thrombosis) ([place]); Pneumonia (12/06/2015); GERD (gastroesophageal reflux disease); Migraine; and Depression.  PAST SURGICAL HISTORY: He  has past surgical history that includes No past surgeries.  Allergies  Allergen Reactions  . Latex Rash    No current facility-administered medications on file prior to encounter.   Current Outpatient Prescriptions on File Prior to Encounter  Medication Sig  . diltiazem (CARDIZEM SR) 120 MG 12 hr capsule Take 1 capsule (120 mg total) by mouth every 12 (twelve) hours.  Marland Kitchen glyBURIDE (DIABETA) 5 MG tablet Take 2 tablets (10 mg total) by mouth 2 (two) times daily  with a meal.  . insulin aspart (NOVOLOG) 100 UNIT/ML injection 0-15 Units, Subcutaneous, 3 times daily with meals CBG < 70: implement hypoglycemia protocol CBG 70 - 120: 0 units CBG 121 - 150: 2 units CBG 151 - 200: 3 units CBG 201 - 250: 5 units CBG 251 - 300: 8 units CBG 301 - 350: 11 units CBG 351 - 400: 15 units CBG > 400: call MD  . insulin detemir (LEVEMIR) 100 UNIT/ML injection Inject 0.25 mLs (25 Units total) into the skin daily.  . ferrous sulfate 325 (65 FE) MG tablet Take 1 tablet (325 mg total) by mouth daily with breakfast. (Patient not taking: Reported on 12/17/2015)  . furosemide (LASIX) 40 MG tablet Take 1.5 tablets (60 mg total) by mouth 2 (two) times daily.  Marland Kitchen guaiFENesin (MUCINEX) 600 MG 12 hr tablet Take 1 tablet (600 mg total) by mouth 2 (two) times daily as needed.  . hydrALAZINE (APRESOLINE) 50 MG tablet Take 0.5 tablets (25 mg total) by mouth 3 (three) times daily.  Marland Kitchen ipratropium-albuterol (DUONEB) 0.5-2.5 (3) MG/3ML SOLN Take 3 mLs by nebulization every 4 (four) hours as needed.  . potassium chloride (K-DUR) 10 MEQ tablet Take 2 tablets (20 mEq total) by mouth daily.    FAMILY HISTORY:  His has no family status information on file.   SOCIAL HISTORY: He  reports that he has been smoking Cigarettes.  He has a 12 pack-year smoking history. He has never used smokeless tobacco. He reports that he drinks alcohol. He reports that he uses illicit drugs ("Crack" cocaine).  REVIEW OF SYSTEMS:   Unable to obtain.  SUBJECTIVE:  Acutely ill 65 year old male, appearing to be in respiratory distress with use of accessory muscles and coarse crackles throughout lung fields.   VITAL SIGNS: BP 165/87 mmHg  Pulse 94  Temp(Src) 98.7 F (37.1 C) (Axillary)  Resp 23  Ht _0  (1.803 m)  Wt 211 lb 13.8 oz (96.1 kg)  BMI 29.56 kg/m2  SpO2 93% 100% NRB HEMODYNAMICS:    VENTILATOR SETTINGS:    INTAKE / OUTPUT: I/O last 3 completed shifts: In: 1166.3 [P.O.:240;  I.V.:176.3; Other:300; IV Piggyback:450] Out: 5275 [Urine:5275]  PHYSICAL EXAMINATION: General: Acutely ill male,  Neuro: Lethargic, oriented. No acute deficits   HEENT: JVD, Head Non traumatic, mucous membranes moist Cardiovascular: RRR, no murmurs, gallops, or rubs, +2 Edema of bilaterally lower extremities   Lungs: Coarse Crackles bilaterally, use of accessory muscles noted  Abdomen: Soft, non-tender, bowels sounds active  Musculoskeletal: No deformities  Skin: Warm, Dry, Intact    LABS:  BMET  Recent Labs Lab 01/09/16 1559 01/09/16 1651 01/10/16 0348 01/11/16 0346  NA 140 139 139 140  K 4.4 4.3 4.5 4.3  CL 109 108 107 106  CO2 22  --  20* 22  BUN 54* 47* 52* 51*  CREATININE 2.17* 2.00* 2.14* 2.12*  GLUCOSE 120* 113* 162* 214*    Electrolytes  Recent Labs Lab 01/09/16 1559 01/10/16 0348 01/11/16 0346  CALCIUM 8.7* 8.6* 8.6*  MG  --   --  1.9    CBC  Recent Labs Lab 01/09/16 1559 01/09/16 1651 01/10/16 0348 01/11/16 0346  WBC 11.8*  --  10.3 8.3  HGB 8.2* 8.5* 7.4* 7.1*  HCT 25.5* 25.0* 23.0* 21.9*  PLT 304  --  271 257    Coag's  Recent Labs Lab 01/10/16 0348  INR 1.26    Sepsis Markers  Recent Labs Lab 01/09/16 1901 01/09/16 2347 01/11/16 0346  LATICACIDVEN 1.77  --   --   PROCALCITON  --  0.95 1.42    ABG  Recent Labs Lab 01/09/16 1600 01/09/16 1910  PHART 7.404 7.427  PCO2ART 33.0* 31.0*  PO2ART 80.6 87.2    Liver Enzymes  Recent Labs Lab 01/09/16 1559 01/10/16 0348 01/11/16 0346  AST 49* 44* 33  ALT 50 43 37  ALKPHOS 187* 144* 136*  BILITOT 0.4 0.4 0.9  ALBUMIN 2.7* 2.3* 2.2*    Cardiac Enzymes No results for input(s): TROPONINI, PROBNP in the last 168 hours.  Glucose  Recent Labs Lab 01/10/16 0231 01/10/16 0310 01/10/16 0735 01/10/16 1117 01/10/16 1609 01/10/16 2156  GLUCAP 58* 126* 125* 155* 161* 229*    Imaging No results found.   STUDIES:  Chest Xray 1/31 >>> Predominantly perihilar  pattern of asymmetric airspace disease which could be asymmetric pulmonary edema or possibly bilateral infiltrates. There is also a left pleural effusion. Pulmonary edema vs infiltrates.  CT Ad Pelvis 2/1 >>> Findings consistent with fluid overload and bilateral pleural effusions and whole body edema   CULTURES: Blood Culture x 1 1/31 >>> Urine Culture  1/31 >>>  ANTIBIOTICS: Zoysn 1/31 >>> Vancomycin 1/31 >>>  SIGNIFICANT EVENTS:   LINES/TUBES: Urethral Catheter 1/31 >>>  DISCUSSION: 65 year old male presented from SNF on 1/31 with hypoxic respiratory failure. Upon admission to ED was found to be breathing rapidly with oxygen saturations in the 70s. Had a recent hospital admission for HCAP and CHF from 1/7-1/15. Back again for what seems like pulmonary edema & less likely infection (refuses medications). Patient was started on diuretics  and broad spectrum antibiotics. On 2/2 patient is still requiring 6 L [place] and wanting to leave AMA. 2/2 psychiatrist evaluated and recommended that patient does not meet criteria for competency. CXR w/ worsening overload and he is up 9 lbs since admit. Would treat w/ aggressive diuretics, narrow abx and  trend PCT. Suspect he should improve w/ diuretics.   ASSESSMENT / PLAN:  Acute Hypoxic Respiratory Failure in setting of diffuse pulmonary infiltrates d/t decompensated diastolic HF/pulmonary edema.  Recent HCAP ->doubt infected; now day 3 abx. Non-toxic appearing.  Plan - dc abx - trend PCT, fever and WBC curve  - Continue Diuresis as BP tolerates - 80 mg Q 8 hours  - BP control   Diastolic HF & HTN Plan Cont hydralazine; cont cardiazem   Cont lasix as above  Chronic Kidney Disease (Baseline Creatinine 2.0) PLAN - Monitor closely with nephrotoxic drugs  - Repeat BMP in after 3rd dose of Lasix   Anemia of chronic disease. Hgb 7.1 but no evidence of acute bleeding. Suspect that there is a component of hemodilution here  Plan Dc xfusion Trend  CBC   Diabetes Mellitus  Plan -Glucose Checks  -SSI  Medical incompetence  Plan Per psych   Erick Colace ACNP-BC [place] Pager # [phone] OR # [phone] if no answer  01/11/2016, 8:26 AM

## 2016-01-11 NOTE — Progress Notes (Signed)
CSW assisting with d/c planning. PN reviewed. MD notes pt does not have capacity for decision making. Pt's Clayton [name] [phone] ) is supportive and has been visiting pt in hospital. [name] may be helpful, reassuring pt to remain in hospital, if pt becomes anxious / wanting to leave. CSW will continue to follow to assist with d/c planning back to [facility] at d/c.  Cori Razor LCSW [phone]

## 2016-01-12 DIAGNOSIS — J189 Pneumonia, unspecified organism: Secondary | ICD-10-CM

## 2016-01-12 DIAGNOSIS — J810 Acute pulmonary edema: Secondary | ICD-10-CM

## 2016-01-12 LAB — URINE CULTURE

## 2016-01-12 LAB — CBC WITH DIFFERENTIAL/PLATELET
BASOS ABS: 0 10*3/uL (ref 0.0–0.1)
Basophils Relative: 0 %
Eosinophils Absolute: 0.3 10*3/uL (ref 0.0–0.7)
Eosinophils Relative: 5 %
HEMATOCRIT: 20.8 % — AB (ref 39.0–52.0)
HEMOGLOBIN: 6.7 g/dL — AB (ref 13.0–17.0)
LYMPHS PCT: 12 %
Lymphs Abs: 0.8 10*3/uL (ref 0.7–4.0)
MCH: 28.4 pg (ref 26.0–34.0)
MCHC: 32.2 g/dL (ref 30.0–36.0)
MCV: 88.1 fL (ref 78.0–100.0)
MONO ABS: 0.6 10*3/uL (ref 0.1–1.0)
MONOS PCT: 9 %
NEUTROS ABS: 4.8 10*3/uL (ref 1.7–7.7)
NEUTROS PCT: 75 %
Platelets: 241 10*3/uL (ref 150–400)
RBC: 2.36 MIL/uL — ABNORMAL LOW (ref 4.22–5.81)
RDW: 15.4 % (ref 11.5–15.5)
WBC: 6.5 10*3/uL (ref 4.0–10.5)

## 2016-01-12 LAB — CBC
HEMATOCRIT: 23.5 % — AB (ref 39.0–52.0)
HEMOGLOBIN: 7.8 g/dL — AB (ref 13.0–17.0)
MCH: 29.2 pg (ref 26.0–34.0)
MCHC: 33.2 g/dL (ref 30.0–36.0)
MCV: 88 fL (ref 78.0–100.0)
Platelets: 264 10*3/uL (ref 150–400)
RBC: 2.67 MIL/uL — ABNORMAL LOW (ref 4.22–5.81)
RDW: 15.1 % (ref 11.5–15.5)
WBC: 6.5 10*3/uL (ref 4.0–10.5)

## 2016-01-12 LAB — BASIC METABOLIC PANEL
ANION GAP: 10 (ref 5–15)
BUN: 55 mg/dL — ABNORMAL HIGH (ref 6–20)
CO2: 23 mmol/L (ref 22–32)
Calcium: 8.2 mg/dL — ABNORMAL LOW (ref 8.9–10.3)
Chloride: 103 mmol/L (ref 101–111)
Creatinine, Ser: 2.4 mg/dL — ABNORMAL HIGH (ref 0.61–1.24)
GFR calc Af Amer: 32 mL/min — ABNORMAL LOW (ref >60)
GFR calc non Af Amer: 27 mL/min — ABNORMAL LOW (ref >60)
GLUCOSE: 265 mg/dL — AB (ref 65–99)
POTASSIUM: 4 mmol/L (ref 3.5–5.1)
Sodium: 136 mmol/L (ref 135–145)

## 2016-01-12 LAB — GLUCOSE, CAPILLARY
GLUCOSE-CAPILLARY: 243 mg/dL — AB (ref 65–99)
GLUCOSE-CAPILLARY: 310 mg/dL — AB (ref 65–99)
Glucose-Capillary: 238 mg/dL — ABNORMAL HIGH (ref 65–99)
Glucose-Capillary: 231 mg/dL — ABNORMAL HIGH (ref 65–99)

## 2016-01-12 LAB — MAGNESIUM: Magnesium: 2.1 mg/dL (ref 1.7–2.4)

## 2016-01-12 LAB — PREPARE RBC (CROSSMATCH)

## 2016-01-12 MED ORDER — POTASSIUM CHLORIDE CRYS ER 20 MEQ PO TBCR
40.0000 meq | EXTENDED_RELEASE_TABLET | Freq: Once | ORAL | Status: AC
Start: 2016-01-12 — End: 2016-01-12
  Administered 2016-01-12: 40 meq via ORAL
  Filled 2016-01-12: qty 2

## 2016-01-12 MED ORDER — FUROSEMIDE 10 MG/ML IJ SOLN
80.0000 mg | Freq: Four times a day (QID) | INTRAMUSCULAR | Status: AC
  Administered 2016-01-12 (×2): 80 mg via INTRAVENOUS
  Filled 2016-01-12 (×2): qty 8

## 2016-01-12 MED ORDER — SODIUM CHLORIDE 0.9 % IV SOLN: Freq: Once | INTRAVENOUS | Status: DC

## 2016-01-12 MED ORDER — INSULIN ASPART 100 UNIT/ML ~~LOC~~ SOLN
2.0000 [IU] | SUBCUTANEOUS | Status: DC
  Administered 2016-01-12: 6 [IU] via SUBCUTANEOUS

## 2016-01-12 MED ORDER — SODIUM CHLORIDE 0.9 % IV SOLN
INTRAVENOUS | Status: DC
  Administered 2016-01-12: 2 [IU]/h via INTRAVENOUS
  Filled 2016-01-12: qty 2.5

## 2016-01-12 NOTE — Progress Notes (Signed)
CRITICAL VALUE ALERT  Critical value received:  Hbg 6.7  Date of notification:  01/12/2016  Time of notification:  0420  Critical value read back:Yes.    Nurse who received alert:  Loletta Parish RN  MD notified (1st page):  elink MD  Time of first page:  MD aware at time of notification   MD notified (2nd page):  Time of second page:  Responding MD:  elink MD  Time MD responded:  MD aware at time of notification

## 2016-01-12 NOTE — Progress Notes (Signed)
PULMONARY / CRITICAL CARE MEDICINE   Name: Clayton [name] MRN: [id number] DOB: [birth year]    ADMISSION DATE:  01/09/2016 CONSULTATION DATE:  01/11/2016  REFERRING MD:  Allena Katz  CHIEF COMPLAINT: Acute hypoxic respiratory failure   BRIEF 65 y/o male with CKD, DM and diastolic heart failure with acute hypoxemic respiratory failure due to pulmonary edema.   SUBJECTIVE:  Improved resipiratory status today No acute events overnight  VITAL SIGNS: BP 146/67 mmHg  Pulse 85  Temp(Src) 98.1 F (36.7 C) (Oral)  Resp 25  Ht  (1.803 m)  Wt 96.8 kg (213 lb 6.5 oz)  BMI 29.78 kg/m2  SpO2 99% 100% NRB HEMODYNAMICS:    VENTILATOR SETTINGS: Vent Mode:  [-]  FiO2 (%):  [60 %-100 %] 60 %  INTAKE / OUTPUT: I/O last 3 completed shifts: In: 1830 [P.O.:1400; Blood:330; IV Piggyback:100] Out: 4625 [Urine:4625]  PHYSICAL EXAMINATION: General: no acute distress HENT: NCAT OP clear PULM: Crackles bilaterally, improved CV: RRR, no mgr GI: BS+, soft, non tender MSK: diminished bulk, tone Derm: edema legs bilaterally Neuro: awake, alert, moves all four extremities  LABS:  BMET  Recent Labs Lab 01/10/16 0348 01/11/16 0346 01/12/16 0342  NA 139 140 136  K 4.5 4.3 4.0  CL 107 106 103  CO2 20* 22 23  BUN 52* 51* 55*  CREATININE 2.14* 2.12* 2.40*  GLUCOSE 162* 214* 265*    Electrolytes  Recent Labs Lab 01/10/16 0348 01/11/16 0346 01/12/16 0342  CALCIUM 8.6* 8.6* 8.2*  MG  --  1.9 2.1    CBC  Recent Labs Lab 01/11/16 0346 01/12/16 0342 01/12/16 1051  WBC 8.3 6.5 6.5  HGB 7.1* 6.7* 7.8*  HCT 21.9* 20.8* 23.5*  PLT 257 241 264    Coag's  Recent Labs Lab 01/10/16 0348  INR 1.26    Sepsis Markers  Recent Labs Lab 01/09/16 1901 01/09/16 2347 01/11/16 0346  LATICACIDVEN 1.77  --   --   PROCALCITON  --  0.95 1.42    ABG  Recent Labs Lab 01/09/16 1600 01/09/16 1910  PHART 7.404 7.427  PCO2ART 33.0* 31.0*  PO2ART 80.6 87.2    Liver  Enzymes  Recent Labs Lab 01/09/16 1559 01/10/16 0348 01/11/16 0346  AST 49* 44* 33  ALT 50 43 37  ALKPHOS 187* 144* 136*  BILITOT 0.4 0.4 0.9  ALBUMIN 2.7* 2.3* 2.2*    Cardiac Enzymes No results for input(s): TROPONINI, PROBNP in the last 168 hours.  Glucose  Recent Labs Lab 01/10/16 2156 01/11/16 0738 01/11/16 1133 01/11/16 1749 01/11/16 2149 01/12/16 0803  GLUCAP 229* 224* 195* 163* 280* 238*    Imaging No results found.   STUDIES:  Chest Xray 1/31 >>> Predominantly perihilar pattern of asymmetric airspace disease which could be asymmetric pulmonary edema or possibly bilateral infiltrates. There is also a left pleural effusion. Pulmonary edema vs infiltrates.  CT Ad Pelvis 2/1 >>> Findings consistent with fluid overload and bilateral pleural effusions and whole body edema   CULTURES: Blood Culture x 1 1/31 >>> Urine Culture  1/31 >>>  ANTIBIOTICS: Zoysn 1/31 >>> 2/1 Vancomycin 1/31 >>> 2/1  SIGNIFICANT EVENTS:   LINES/TUBES: Urethral Catheter 1/31 >>>  DISCUSSION: 65 year old male presented from SNF with acute on chronic respiratory failure with hypoxemia due to acute pulmonary edema due to acute decompensated diastolic heart failure.  The ddx includes viral pneumonitis.  ASSESSMENT / PLAN:  Acute Hypoxic Respiratory Failure with hypoxemia Acute pulmonary edema Acute decompensated diastolic HF Doubt  HCAP Plan Continue diuresis  IV lasix q8hs Continue high flow oxygen Out of bed Ambulate as able Incentive spirometry  Diastolic HF & HTN Plan Cont hydralazine; cont cardiazem   Cont lasix as above  Chronic Kidney Disease (Baseline Creatinine 2.0) PLAN Replete K today Repeat BMET in AM  Anemia of chronic disease.  Hgb 7.1 but no evidence of acute bleeding Plan Transfuse 1 U PRBC this morning Trend CBC   Diabetes Mellitus  Plan Glucose Checks  SSI  Medical incompetence  Plan Per psych   Continue monitoring in ICU  Heber Bronson, MD [place] [facility] Pager: [phone] Cell: [phone] After 3pm or if no response, call [phone]

## 2016-01-12 NOTE — Progress Notes (Addendum)
[facility] Progress Note  Patient: Clayton [name] WUJ:[id number]   PCP: PROVIDER NOT IN SYSTEM DOB: Apr 19, 1951   DOA: 01/09/2016   DOS: 01/12/2016   Date of Service: the patient was seen and examined on 01/12/2016  Subjective: breathing is better. No chest pain, no abdominal pain. Nutrition: Able to tolerate oral diet Activity: Bedridden due to distress Last BM: 01/11/2016  Assessment and Plan: 1. 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 Chronic kidney disease. Hypertensive urgency. Possible healthcare associated pneumonia. Patient's dry weight 169 pound as per 11/2015  presents with worsening breathing. Requiring nonrebreather. Later on was high flow nasal cannula and currently on 4 LPM nasal cannula. Appears in significant respiratory distress and has bilateral crackles on examination. Continue with IV Lasix, monitor renal function, continue with antibiotics since his pro-calcitonin is elevated from his prior admission. Follow the cultures. Appreciate input from critical care, currently recommending to increase Lasix to 80 mg q6 hours.  2. Medical noncompliance. As per the documentation the patient has not been compliant with medical treatment even in the nursing home facility. Noncompliant with medical treatment here as well and has recurrent admissions for acute on chronic CHF. Patient continues to remain in significant respiratory distress and wants to go home and mentions that he is feeling better and perfectly fine. Appreciate psychiatric consultation. Patient does not meet criteria for capacity to make his own medical decisions  Social worker and case manager consulted to assist with further management and possible guardianship.  3. Hypertensive urgency.  blood pressure appears to be stable. We'll continue current regimen.  4. Chronic kidney disease.  Renal function functioning improving. We'll continue to monitor.  5.  Diabetes mellitus with chronic kidney disease. Hemoglobin A1c December 2016 14.7. Chronically noncompliant with medical regimen. sugar here are stable, started on ICU hyperglycemia protocol per diabetes coordinator.  6. Abdominal pain. No acute abnormality in the CT scan. Primarily volume overload. We'll continue to monitor.  7. Anemia of chronic disease. Gradually worsening over prolong period without active bleeding. Patient received 1 PRBC and repeat CBC is augmented appropriately. Will monitor daily.   DVT Prophylaxis: subcutaneous Heparin Nutrition: Cardiac and diabetic diet Advance goals of care discussion: full code  Brief Summary of Hospitalization:  HPI: As per the H and P dictated on admission, "Clayton [name] is a 65 y.o. male with a past medical history significant for IDDM, CKD stage III, and HTN who presents with hypoxic respiratory failure from SNF.  The patient has been admitted to the hospital numerous times in the last 3 months. Most recently he was admitted for HCAP and CHF from 1/7 to 1/15, aggressively diuresed, treated with cefepime for 7 days and then discharged to SNF.  I called the nursing home, who stated that the patient had been there since discharge, alert and responsive, 1 assist for all ADLs, resistant to all cares, propel himself in a wheelchair, and with normal blood pressure and no fever on all recorded vital signs in the last week. When evaluated by nursing staff he was found to be hypoxic in the 70s, and so he was sent to the ER.  In the ED, he was hypoxic in the 70s and breathing rapidly, but refused both BiPAP and intubation.TRH were asked to evaluate for admission."  Daily update, Procedures: 01/10/2016 psychiatric consulted 01/11/2016 critical care consulted. Consultants: Psychiatric Antibiotics: Anti-infectives    Start     Dose/Rate Route Frequency Ordered Stop   01/10/16 1800  vancomycin (VANCOCIN) 1,250 mg in sodium chloride 0.9 % 250 mL  IVPB  Status:  Discontinued     1,250 mg 166.7 mL/hr over 90 Minutes Intravenous Every 24 hours 01/09/16 2245 01/11/16 1129   01/10/16 0400  piperacillin-tazobactam (ZOSYN) IVPB 3.375 g  Status:  Discontinued     3.375 g 12.5 mL/hr over 240 Minutes Intravenous Every 8 hours 01/09/16 2245 01/11/16 1129   01/09/16 1800  vancomycin (VANCOCIN) 1,750 mg in sodium chloride 0.9 % 500 mL IVPB     1,750 mg 250 mL/hr over 120 Minutes Intravenous  Once 01/09/16 1749 01/09/16 2050   01/09/16 1800  piperacillin-tazobactam (ZOSYN) IVPB 3.375 g     3.375 g 100 mL/hr over 30 Minutes Intravenous  Once 01/09/16 1749 01/09/16 2019      Family Communication: no family was present at bedside, at the time of interview.   Disposition:  Barriers to safe discharge: Improvement oxygenation   Intake/Output Summary (Last 24 hours) at 01/12/16 1606 Last data filed at 01/12/16 1200  Gross per 24 hour  Intake   1370 ml  Output   2285 ml  Net   -915 ml   Filed Weights   01/11/16 0300 01/12/16 0400 01/12/16 0459  Weight: 96.1 kg (211 lb 13.8 oz) 95.5 kg (210 lb 8.6 oz) 96.8 kg (213 lb 6.5 oz)   Objective: Physical Exam: Filed Vitals:   01/12/16 0900 01/12/16 1000 01/12/16 1120 01/12/16 1200  BP:  146/67  152/68  Pulse: 89 85 86 84  Temp:    98.6 F (37 C)  TempSrc:    Oral  Resp: 24 25 21  33  Height:      Weight:      SpO2: 92% 99% 95% 96%    General: Appear in severe distress, no Rash; Oral Mucosa moist. Cardiovascular: S1 and S2 Present, no Murmur, difficult to assess JVD Respiratory: Bilateral Air entry present and bilateral rhonchi and Crackles, no wheezes Abdomen: Bowel Sound present, Soft and no tenderness Extremities: Bilateral Pedal edema, no calf tenderness  Data Reviewed: CBC:  Recent Labs Lab 01/09/16 1559 01/09/16 1651 01/10/16 0348 01/11/16 0346 01/12/16 0342 01/12/16 1051  WBC 11.8*  --  10.3 8.3 6.5 6.5  NEUTROABS 10.1*  --   --  6.8 4.8  --   HGB 8.2* 8.5* 7.4* 7.1*  6.7* 7.8*  HCT 25.5* 25.0* 23.0* 21.9* 20.8* 23.5*  MCV 88.5  --  89.5 89.0 88.1 88.0  PLT 304  --  271 257 241 264   Basic Metabolic Panel:  Recent Labs Lab 01/09/16 1559 01/09/16 1651 01/10/16 0348 01/11/16 0346 01/12/16 0342  NA 140 139 139 140 136  K 4.4 4.3 4.5 4.3 4.0  CL 109 108 107 106 103  CO2 22  --  20* 22 23  GLUCOSE 120* 113* 162* 214* 265*  BUN 54* 47* 52* 51* 55*  CREATININE 2.17* 2.00* 2.14* 2.12* 2.40*  CALCIUM 8.7*  --  8.6* 8.6* 8.2*  MG  --   --   --  1.9 2.1   Liver Function Tests:  Recent Labs Lab 01/09/16 1559 01/10/16 0348 01/11/16 0346  AST 49* 44* 33  ALT 50 43 37  ALKPHOS 187* 144* 136*  BILITOT 0.4 0.4 0.9  PROT 6.9 6.0* 6.0*  ALBUMIN 2.7* 2.3* 2.2*   No results for input(s): LIPASE, AMYLASE in the last 168 hours. No results for input(s): AMMONIA in the last 168 hours.  Cardiac Enzymes: No results for input(s): CKTOTAL, CKMB,  CKMBINDEX, TROPONINI in the last 168 hours.  BNP (last 3 results)  Recent Labs  12/04/15 0422 12/16/15 1913 01/09/16 1559  BNP 557.6* 1348.3* 500.3*    CBG:  Recent Labs Lab 01/11/16 1133 01/11/16 1749 01/11/16 2149 01/12/16 0803 01/12/16 1131  GLUCAP 195* 163* 280* 238* 231*    Recent Results (from the past 240 hour(s))  Urine culture     Status: None   Collection Time: 01/09/16  6:00 PM  Result Value Ref Range Status   Specimen Description URINE, RANDOM  Final   Special Requests NONE  Final   Culture   Final    30,000 COLONIES/mL PROTEUS MIRABILIS Performed at [facility]    Report Status 01/12/2016 FINAL  Final   Organism ID, Bacteria PROTEUS MIRABILIS  Final      Susceptibility   Proteus mirabilis - MIC*    AMPICILLIN <=2 SENSITIVE Sensitive     CEFAZOLIN <=4 SENSITIVE Sensitive     CEFTRIAXONE <=1 SENSITIVE Sensitive     CIPROFLOXACIN 2 INTERMEDIATE Intermediate     GENTAMICIN <=1 SENSITIVE Sensitive     IMIPENEM 2 SENSITIVE Sensitive     NITROFURANTOIN 128 RESISTANT  Resistant     TRIMETH/SULFA <=20 SENSITIVE Sensitive     AMPICILLIN/SULBACTAM <=2 SENSITIVE Sensitive     PIP/TAZO <=4 SENSITIVE Sensitive     * 30,000 COLONIES/mL PROTEUS MIRABILIS  Blood culture (routine x 2)     Status: None (Preliminary result)   Collection Time: 01/09/16  6:50 PM  Result Value Ref Range Status   Specimen Description BLOOD LEFT ANTECUBITAL  Final   Special Requests IN PEDIATRIC BOTTLE 3 CC  Final   Culture   Final    NO GROWTH 3 DAYS Performed at [facility]    Report Status PENDING  Incomplete  MRSA PCR Screening     Status: None   Collection Time: 01/09/16  9:47 PM  Result Value Ref Range Status   MRSA by PCR NEGATIVE NEGATIVE Final    Comment:        The GeneXpert MRSA Assay (FDA approved for NASAL specimens only), is one component of a comprehensive MRSA colonization surveillance program. It is not intended to diagnose MRSA infection nor to guide or monitor treatment for MRSA infections.     Studies: No results found.   Scheduled Meds: . diltiazem  120 mg Oral Q12H  . ferrous sulfate  325 mg Oral Q breakfast  . furosemide  80 mg Intravenous Q6H  . guaiFENesin  600 mg Oral BID  . haloperidol  1 mg Oral BID  . hydrALAZINE  25 mg Oral TID  . insulin aspart  2-6 Units Subcutaneous 6 times per day  . sodium chloride flush  3 mL Intravenous Q12H   Continuous Infusions:  PRN Meds: haloperidol lactate, LORazepam  Time spent: 30 minutes  Author: Lynden Oxford, MD [facility] Pager: [phone] 01/12/2016 4:06 PM  If 7PM-7AM, please contact night-coverage at [email or web address], password [facility]

## 2016-01-12 NOTE — Progress Notes (Signed)
eLink Physician-Brief Progress Note Patient Name: Clayton [name] DOB: November 11, 1951 MRN: [id number]   Date of Service  01/12/2016  HPI/Events of Note  Hgb drift down over several days.  Now 6.7 from 7.1  eICU Interventions  Plan: Transfuse 1 unit pRBC Post-transfusion CBC     Intervention Category Intermediate Interventions: Other:  [name],Clayton 01/12/2016, 4:24 AM

## 2016-01-12 NOTE — Progress Notes (Signed)
Inpatient Diabetes Program Recommendations  AACE/ADA: New Consensus Statement on Inpatient Glycemic Control (2015)  Target Ranges:  Prepandial:   less than 140 mg/dL      Peak postprandial:   less than 180 mg/dL (1-2 hours)      Critically ill patients:  140 - 180 mg/dL   Review of Glycemic Control  Results for KAZUKI, INGLE (MRN [id number]) as of 01/12/2016 14:08  Ref. Range 01/11/2016 21:49 01/12/2016 08:03 01/12/2016 11:31  Glucose-Capillary Latest Ref Range: 65-99 mg/dL 409 (H) 811 (H) 914 (H)  Needs insulin adjustment.  Inpatient Diabetes Program Recommendations:    Consider ICU Hyperglycemia Protocol for glycemic control.  Will follow. Thank you. Ailene Ards, RD, LDN, CDE Inpatient Diabetes Coordinator [phone]

## 2016-01-13 LAB — GLUCOSE, CAPILLARY
GLUCOSE-CAPILLARY: 109 mg/dL — AB (ref 65–99)
GLUCOSE-CAPILLARY: 142 mg/dL — AB (ref 65–99)
GLUCOSE-CAPILLARY: 156 mg/dL — AB (ref 65–99)
GLUCOSE-CAPILLARY: 163 mg/dL — AB (ref 65–99)
GLUCOSE-CAPILLARY: 172 mg/dL — AB (ref 65–99)
GLUCOSE-CAPILLARY: 178 mg/dL — AB (ref 65–99)
GLUCOSE-CAPILLARY: 220 mg/dL — AB (ref 65–99)
GLUCOSE-CAPILLARY: 228 mg/dL — AB (ref 65–99)
GLUCOSE-CAPILLARY: 279 mg/dL — AB (ref 65–99)
Glucose-Capillary: 256 mg/dL — ABNORMAL HIGH (ref 65–99)
Glucose-Capillary: 214 mg/dL — ABNORMAL HIGH (ref 65–99)
Glucose-Capillary: 119 mg/dL — ABNORMAL HIGH (ref 65–99)
Glucose-Capillary: 97 mg/dL (ref 65–99)
Glucose-Capillary: 131 mg/dL — ABNORMAL HIGH (ref 65–99)
Glucose-Capillary: 155 mg/dL — ABNORMAL HIGH (ref 65–99)

## 2016-01-13 LAB — BASIC METABOLIC PANEL
Anion gap: 10 (ref 5–15)
BUN: 46 mg/dL — AB (ref 6–20)
CALCIUM: 8.6 mg/dL — AB (ref 8.9–10.3)
CHLORIDE: 106 mmol/L (ref 101–111)
CO2: 24 mmol/L (ref 22–32)
CREATININE: 2.03 mg/dL — AB (ref 0.61–1.24)
GFR calc Af Amer: 39 mL/min — ABNORMAL LOW (ref >60)
GFR calc non Af Amer: 33 mL/min — ABNORMAL LOW (ref >60)
GLUCOSE: 106 mg/dL — AB (ref 65–99)
Potassium: 4 mmol/L (ref 3.5–5.1)
Sodium: 140 mmol/L (ref 135–145)

## 2016-01-13 LAB — CBC
HEMATOCRIT: 24 % — AB (ref 39.0–52.0)
Hemoglobin: 7.7 g/dL — ABNORMAL LOW (ref 13.0–17.0)
MCH: 28.6 pg (ref 26.0–34.0)
MCHC: 32.1 g/dL (ref 30.0–36.0)
MCV: 89.2 fL (ref 78.0–100.0)
PLATELETS: 300 10*3/uL (ref 150–400)
RBC: 2.69 MIL/uL — ABNORMAL LOW (ref 4.22–5.81)
RDW: 15 % (ref 11.5–15.5)
WBC: 6.1 10*3/uL (ref 4.0–10.5)

## 2016-01-13 LAB — MAGNESIUM: Magnesium: 1.9 mg/dL (ref 1.7–2.4)

## 2016-01-13 MED ORDER — FUROSEMIDE 40 MG PO TABS
80.0000 mg | ORAL_TABLET | Freq: Once | ORAL | Status: AC
  Administered 2016-01-13: 80 mg via ORAL
  Filled 2016-01-13: qty 2

## 2016-01-13 MED ORDER — FUROSEMIDE 10 MG/ML IJ SOLN: 80.0000 mg | Freq: Once | INTRAMUSCULAR | Status: DC

## 2016-01-13 MED ORDER — GLYBURIDE 5 MG PO TABS
10.0000 mg | ORAL_TABLET | Freq: Two times a day (BID) | ORAL | Status: DC
  Administered 2016-01-13 – 2016-01-15 (×4): 10 mg via ORAL
  Filled 2016-01-13 (×7): qty 2

## 2016-01-13 MED ORDER — INSULIN ASPART 100 UNIT/ML ~~LOC~~ SOLN
0.0000 [IU] | Freq: Three times a day (TID) | SUBCUTANEOUS | Status: DC
  Administered 2016-01-13: 8 [IU] via SUBCUTANEOUS
  Administered 2016-01-13 – 2016-01-14 (×3): 5 [IU] via SUBCUTANEOUS
  Administered 2016-01-15: 3 [IU] via SUBCUTANEOUS

## 2016-01-13 MED ORDER — INSULIN ASPART 100 UNIT/ML ~~LOC~~ SOLN: 0.0000 [IU] | Freq: Every day | SUBCUTANEOUS | Status: DC

## 2016-01-13 MED ORDER — INSULIN GLARGINE 100 UNIT/ML ~~LOC~~ SOLN
20.0000 [IU] | Freq: Every day | SUBCUTANEOUS | Status: DC
  Administered 2016-01-13: 20 [IU] via SUBCUTANEOUS
  Filled 2016-01-13: qty 0.2

## 2016-01-13 MED ORDER — IPRATROPIUM-ALBUTEROL 0.5-2.5 (3) MG/3ML IN SOLN: 3.0000 mL | RESPIRATORY_TRACT | Status: DC | PRN

## 2016-01-13 MED ORDER — INSULIN ASPART 100 UNIT/ML ~~LOC~~ SOLN
0.0000 [IU] | Freq: Every day | SUBCUTANEOUS | Status: DC
  Administered 2016-01-14: 2 [IU] via SUBCUTANEOUS

## 2016-01-13 MED ORDER — FUROSEMIDE 10 MG/ML IJ SOLN
40.0000 mg | Freq: Two times a day (BID) | INTRAMUSCULAR | Status: DC
  Filled 2016-01-13: qty 4

## 2016-01-13 NOTE — Care Management (Signed)
Reviewed consult for CM. Competency guardianship referred to CSW. Notified Holly CSW. Physicist, medical BSN CCM

## 2016-01-13 NOTE — Progress Notes (Signed)
[facility] Progress Note  Patient: Clayton [name] ZOX:[id number]   PCP: PROVIDER NOT IN SYSTEM DOB: 26-Apr-1951   DOA: 01/09/2016   DOS: 01/13/2016   Date of Service: the patient was seen and examined on 01/13/2016  Subjective: Breathing is better no chest pain and abdominal pain no nausea or vomiting. Patient has removed his IV line in the afternoon and is refusing further IV stick. Nutrition: Able to tolerate oral diet Activity: Bedridden due to distress Last BM: 01/11/2016  Assessment and Plan: 1. 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 Chronic kidney disease. Hypertensive urgency. Possible healthcare associated pneumonia. Patient's dry weight 169 pound as per 11/2015  presents with worsening breathing. Initially Requiring nonrebreather. Later on was high flow nasal cannula and currently on 2 LPM nasal cannula. Due to distress is improved persistent bilateral crackles but improving as well. Continue with IV Lasix, monitor renal function, continue with antibiotics since his pro-calcitonin is elevated from his prior admission.  Patient given one dose of oral Lasix due to lack of IV access. We will avoid PICC line or central line in this patient. Blood culture negative for 4 days, urine culture shows insignificant Proteus mirabilis. No sputum specimen has been collected by nursing. Appreciate input from critical care,   2. Medical noncompliance. As per the documentation the patient has not been compliant with medical treatment even in the nursing home facility. Noncompliant with medical treatment here as well and has recurrent admissions for acute on chronic CHF. Patient continues to remain in significant respiratory distress and wants to go home and mentions that he is feeling better and perfectly fine. Appreciate psychiatric consultation. Patient does not meet criteria for capacity to make his own medical decisions  Social worker and  case manager consulted to assist with further management and possible guardianship.  3. Hypertensive urgency.  blood pressure appears to be stable. We'll continue current regimen.  4. Chronic kidney disease.  Renal function functioning improving. We'll continue to monitor.  5. Diabetes mellitus with chronic kidney disease. Hemoglobin A1c December 2016 14.7. Chronically noncompliant with medical regimen. sugar here are stable, started on ICU hyperglycemia protocol per diabetes coordinator.  6. Abdominal pain. No acute abnormality in the CT scan. Primarily volume overload. We'll continue to monitor.  7. Anemia of chronic disease. Gradually worsening over prolong period without active bleeding. Patient received 1 PRBC and repeat CBC is augmented appropriately. Will monitor daily. H&H remained stable  DVT Prophylaxis: subcutaneous Heparin Nutrition: Cardiac and diabetic diet Advance goals of care discussion: full code  Brief Summary of Hospitalization:  HPI: As per the H and P dictated on admission, "Clayton [name] is a 65 y.o. male with a past medical history significant for IDDM, CKD stage III, and HTN who presents with hypoxic respiratory failure from SNF.  The patient has been admitted to the hospital numerous times in the last 3 months. Most recently he was admitted for HCAP and CHF from 1/7 to 1/15, aggressively diuresed, treated with cefepime for 7 days and then discharged to SNF.  I called the nursing home, who stated that the patient had been there since discharge, alert and responsive, 1 assist for all ADLs, resistant to all cares, propel himself in a wheelchair, and with normal blood pressure and no fever on all recorded vital signs in the last week. When evaluated by nursing staff he was found to be hypoxic in the 70s, and so he was sent to the ER.  In the ED, he was hypoxic in the 70s and breathing rapidly, but refused both BiPAP and intubation.TRH were asked to evaluate  for admission."  Daily update, Procedures: 01/10/2016 psychiatric consulted 01/11/2016 critical care consulted. Consultants: Psychiatric Antibiotics: Anti-infectives    Start     Dose/Rate Route Frequency Ordered Stop   01/10/16 1800  vancomycin (VANCOCIN) 1,250 mg in sodium chloride 0.9 % 250 mL IVPB  Status:  Discontinued     1,250 mg 166.7 mL/hr over 90 Minutes Intravenous Every 24 hours 01/09/16 2245 01/11/16 1129   01/10/16 0400  piperacillin-tazobactam (ZOSYN) IVPB 3.375 g  Status:  Discontinued     3.375 g 12.5 mL/hr over 240 Minutes Intravenous Every 8 hours 01/09/16 2245 01/11/16 1129   01/09/16 1800  vancomycin (VANCOCIN) 1,750 mg in sodium chloride 0.9 % 500 mL IVPB     1,750 mg 250 mL/hr over 120 Minutes Intravenous  Once 01/09/16 1749 01/09/16 2050   01/09/16 1800  piperacillin-tazobactam (ZOSYN) IVPB 3.375 g     3.375 g 100 mL/hr over 30 Minutes Intravenous  Once 01/09/16 1749 01/09/16 2019      Family Communication: no family was present at bedside, at the time of interview.   Disposition:  Barriers to safe discharge: Improvement oxygenation, social worker assistance in further management of his competency issue   Intake/Output Summary (Last 24 hours) at 01/13/16 1729 Last data filed at 01/13/16 1416  Gross per 24 hour  Intake    360 ml  Output   3700 ml  Net  -3340 ml   Filed Weights   01/12/16 0400 01/12/16 0459 01/13/16 1105  Weight: 95.5 kg (210 lb 8.6 oz) 96.8 kg (213 lb 6.5 oz) 94.2 kg (207 lb 10.8 oz)   Objective: Physical Exam: Filed Vitals:   01/13/16 0700 01/13/16 0800 01/13/16 1105 01/13/16 1359  BP:   154/79   Pulse: 84  88   Temp:  98.9 F (37.2 C) 98.2 F (36.8 C)   TempSrc:  Oral Oral   Resp: 26  24   Height:      Weight:   94.2 kg (207 lb 10.8 oz)   SpO2: 95%  98% 88%    General: Appear in mild distress, no Rash; Oral Mucosa moist. Cardiovascular: S1 and S2 Present, no Murmur, difficult to assess JVD Respiratory: Bilateral Air  entry present and bilateral rhonchi and Crackles, no wheezes Abdomen: Bowel Sound present, Soft and no tenderness Extremities: Bilateral Pedal edema, no calf tenderness  Data Reviewed: CBC:  Recent Labs Lab 01/09/16 1559  01/10/16 0348 01/11/16 0346 01/12/16 0342 01/12/16 1051 01/13/16 0349  WBC 11.8*  --  10.3 8.3 6.5 6.5 6.1  NEUTROABS 10.1*  --   --  6.8 4.8  --   --   HGB 8.2*  < > 7.4* 7.1* 6.7* 7.8* 7.7*  HCT 25.5*  < > 23.0* 21.9* 20.8* 23.5* 24.0*  MCV 88.5  --  89.5 89.0 88.1 88.0 89.2  PLT 304  --  271 257 241 264 300  < > = values in this interval not displayed. Basic Metabolic Panel:  Recent Labs Lab 01/09/16 1559 01/09/16 1651 01/10/16 0348 01/11/16 0346 01/12/16 0342 01/13/16 0349  NA 140 139 139 140 136 140  K 4.4 4.3 4.5 4.3 4.0 4.0  CL 109 108 107 106 103 106  CO2 22  --  20* 22 23 24   GLUCOSE 120* 113* 162* 214* 265* 106*  BUN 54* 47* 52* 51* 55* 46*  CREATININE  2.17* 2.00* 2.14* 2.12* 2.40* 2.03*  CALCIUM 8.7*  --  8.6* 8.6* 8.2* 8.6*  MG  --   --   --  1.9 2.1 1.9   Liver Function Tests:  Recent Labs Lab 01/09/16 1559 01/10/16 0348 01/11/16 0346  AST 49* 44* 33  ALT 50 43 37  ALKPHOS 187* 144* 136*  BILITOT 0.4 0.4 0.9  PROT 6.9 6.0* 6.0*  ALBUMIN 2.7* 2.3* 2.2*   No results for input(s): LIPASE, AMYLASE in the last 168 hours. No results for input(s): AMMONIA in the last 168 hours.  Cardiac Enzymes: No results for input(s): CKTOTAL, CKMB, CKMBINDEX, TROPONINI in the last 168 hours.  BNP (last 3 results)  Recent Labs  12/04/15 0422 12/16/15 1913 01/09/16 1559  BNP 557.6* 1348.3* 500.3*    CBG:  Recent Labs Lab 01/13/16 0622 01/13/16 0735 01/13/16 0841 01/13/16 0944 01/13/16 1122  GLUCAP 131* 155* 163* 172* 220*    Recent Results (from the past 240 hour(s))  Urine culture     Status: None   Collection Time: 01/09/16  6:00 PM  Result Value Ref Range Status   Specimen Description URINE, RANDOM  Final   Special  Requests NONE  Final   Culture   Final    30,000 COLONIES/mL PROTEUS MIRABILIS Performed at [facility]    Report Status 01/12/2016 FINAL  Final   Organism ID, Bacteria PROTEUS MIRABILIS  Final      Susceptibility   Proteus mirabilis - MIC*    AMPICILLIN <=2 SENSITIVE Sensitive     CEFAZOLIN <=4 SENSITIVE Sensitive     CEFTRIAXONE <=1 SENSITIVE Sensitive     CIPROFLOXACIN 2 INTERMEDIATE Intermediate     GENTAMICIN <=1 SENSITIVE Sensitive     IMIPENEM 2 SENSITIVE Sensitive     NITROFURANTOIN 128 RESISTANT Resistant     TRIMETH/SULFA <=20 SENSITIVE Sensitive     AMPICILLIN/SULBACTAM <=2 SENSITIVE Sensitive     PIP/TAZO <=4 SENSITIVE Sensitive     * 30,000 COLONIES/mL PROTEUS MIRABILIS  Blood culture (routine x 2)     Status: None (Preliminary result)   Collection Time: 01/09/16  6:50 PM  Result Value Ref Range Status   Specimen Description BLOOD LEFT ANTECUBITAL  Final   Special Requests IN PEDIATRIC BOTTLE 3 CC  Final   Culture   Final    NO GROWTH 4 DAYS Performed at [facility]    Report Status PENDING  Incomplete  MRSA PCR Screening     Status: None   Collection Time: 01/09/16  9:47 PM  Result Value Ref Range Status   MRSA by PCR NEGATIVE NEGATIVE Final    Comment:        The GeneXpert MRSA Assay (FDA approved for NASAL specimens only), is one component of a comprehensive MRSA colonization surveillance program. It is not intended to diagnose MRSA infection nor to guide or monitor treatment for MRSA infections.     Studies: No results found.   Scheduled Meds: . diltiazem  120 mg Oral Q12H  . ferrous sulfate  325 mg Oral Q breakfast  . furosemide  40 mg Intravenous BID  . glyBURIDE  10 mg Oral BID WC  . guaiFENesin  600 mg Oral BID  . haloperidol  1 mg Oral BID  . hydrALAZINE  25 mg Oral TID  . insulin aspart  0-15 Units Subcutaneous TID WC  . insulin aspart  0-5 Units Subcutaneous QHS  . sodium chloride flush  3 mL Intravenous Q12H  Continuous Infusions:  PRN Meds: haloperidol lactate, ipratropium-albuterol, LORazepam  Time spent: 30 minutes  Author: Lynden Oxford, MD [facility] Pager: [phone] 01/13/2016 5:29 PM  If 7PM-7AM, please contact night-coverage at [email or web address], password [facility]

## 2016-01-13 NOTE — Progress Notes (Signed)
Attempted to start a peripheral IV site per MD orders. IV RN had already attempted 2 sticks. Bedside RN attempted 1 stick using vein finder equipment. Pt was being belligerent towards RN and refused another stick. MD made aware. Will have IV team to try again after they change shifts/staff for night shift. Will defer 6pm IV lasix dose to about 8pm for night shift to give if IV site obtained.   Arta Bruce [facility] 01/13/2016 4:12 PM

## 2016-01-13 NOTE — Progress Notes (Signed)
[facility] PCCM  Chart reviewed, hypoxemia improved Belligerent PCCM will sign off  Clayton Driscoll, MD [place] P[facility]M Pager: [phone] Cell: [phone] After 3pm or if no response, call [phone]

## 2016-01-14 LAB — TYPE AND SCREEN
ABO/RH(D): B POS
Antibody Screen: NEGATIVE
Unit division: 0

## 2016-01-14 LAB — CULTURE, BLOOD (ROUTINE X 2): Culture: NO GROWTH

## 2016-01-14 LAB — GLUCOSE, CAPILLARY
GLUCOSE-CAPILLARY: 246 mg/dL — AB (ref 65–99)
GLUCOSE-CAPILLARY: 247 mg/dL — AB (ref 65–99)
Glucose-Capillary: 213 mg/dL — ABNORMAL HIGH (ref 65–99)

## 2016-01-14 MED ORDER — METOLAZONE 2.5 MG PO TABS
2.5000 mg | ORAL_TABLET | Freq: Once | ORAL | Status: AC
  Administered 2016-01-14: 2.5 mg via ORAL
  Filled 2016-01-14: qty 1

## 2016-01-14 MED ORDER — ISOSORBIDE MONONITRATE ER 30 MG PO TB24
15.0000 mg | ORAL_TABLET | Freq: Every day | ORAL | Status: DC
  Administered 2016-01-14: 15 mg via ORAL
  Filled 2016-01-14: qty 1

## 2016-01-14 MED ORDER — ISOSORBIDE MONONITRATE ER 30 MG PO TB24
30.0000 mg | ORAL_TABLET | Freq: Every day | ORAL | Status: DC
  Administered 2016-01-15: 30 mg via ORAL
  Filled 2016-01-14: qty 1

## 2016-01-14 MED ORDER — HYDRALAZINE HCL 50 MG PO TABS
50.0000 mg | ORAL_TABLET | Freq: Three times a day (TID) | ORAL | Status: DC
  Administered 2016-01-14 – 2016-01-15 (×4): 50 mg via ORAL
  Filled 2016-01-14 (×4): qty 1

## 2016-01-14 MED ORDER — FUROSEMIDE 40 MG PO TABS
80.0000 mg | ORAL_TABLET | Freq: Two times a day (BID) | ORAL | Status: DC
  Administered 2016-01-14 – 2016-01-15 (×3): 80 mg via ORAL
  Filled 2016-01-14 (×3): qty 2

## 2016-01-14 NOTE — Progress Notes (Signed)
Pt refuses to wear O2 despite frequent requests.  O2 sat 86% on RA, no SOB noted.  MD aware. Nino Parsley

## 2016-01-14 NOTE — Evaluation (Signed)
Physical Therapy Evaluation Patient Details Name: Clayton [name] MRN: [id number] DOB: 1951/05/01 Today's Date: 01/14/2016   History of Present Illness  65 y.o. male with Hx of CKD, anemia, HTN, DM II, DVT, and depression. Admitted with principle problems of HCAP / Acute respiratory failure with hypoxia, HTN, chronic diastolic CHF, and  Noncompliance with medications/therapies  Clinical Impression  Pt admitted with above diagnosis. Pt currently with functional limitations due to the deficits listed below (see PT Problem List). Pt ambulated 300' with RW and min/guard assist. SaO2 80% on RA walking, pt refused supplemental O2.  Pt will benefit from skilled PT to increase their independence and safety with mobility to allow discharge to the venue listed below.       Follow Up Recommendations SNF;Supervision/Assistance - 24 hour    Equipment Recommendations  None recommended by PT (to be determined at next level of care)    Recommendations for Other Services       Precautions / Restrictions Precautions Precautions: Fall Precaution Comments: Monitor O2 Restrictions Weight Bearing Restrictions: No      Mobility  Bed Mobility               General bed mobility comments: NT- up in recliner  Transfers Overall transfer level: Needs assistance Equipment used: Rolling walker (2 wheeled) Transfers: Sit to/from Stand Sit to Stand: Min guard         General transfer comment: verbal cues for safety, attempted to get up from recliner while legrest was still up  Ambulation/Gait Ambulation/Gait assistance: Min guard Ambulation Distance (Feet): 300 Feet Assistive device: Rolling walker (2 wheeled) Gait Pattern/deviations: Step-through pattern;Decreased step length - right;Decreased step length - left Gait velocity: slow   General Gait Details: SaO2 80% walking on RA, pt refused O2, bumped into 3 obstacles with RW wheels but able to negotiate around them, no LOB  Stairs             Wheelchair Mobility    Modified Rankin (Stroke Patients Only)       Balance                                             Pertinent Vitals/Pain Pain Assessment: No/denies pain    Home Living Family/patient expects to be discharged to:: Skilled nursing facility                      Prior Function Level of Independence: Independent with assistive device(s)         Comments: pt stated he was independent with RW at SNF, did not need assistance for bathing/dressing     Hand Dominance   Dominant Hand: Right    Extremity/Trunk Assessment               Lower Extremity Assessment: Overall WFL for tasks assessed      Cervical / Trunk Assessment: Normal  Communication   Communication: No difficulties  Cognition Arousal/Alertness: Awake/alert Behavior During Therapy: Impulsive Overall Cognitive Status: No family/caregiver present to determine baseline cognitive functioning           Safety/Judgement: Decreased awareness of safety     General Comments: refused supplemental O2, psychiatry deemed pt not competent to make decision; able to follow directions, pt was writing names of countries on his sweatshirt upon entry, spoke frequently of spiritual matters    General Comments  Exercises        Assessment/Plan    PT Assessment Patient needs continued PT services  PT Diagnosis Difficulty walking   PT Problem List Cardiopulmonary status limiting activity;Decreased safety awareness  PT Treatment Interventions Gait training;Functional mobility training;Therapeutic activities;Patient/family education;Therapeutic exercise;Balance training   PT Goals (Current goals can be found in the Care Plan section) Acute Rehab PT Goals Patient Stated Goal: watch TV PT Goal Formulation: With patient Time For Goal Achievement: 12/31/15 Potential to Achieve Goals: Fair    Frequency Min 2X/week   Barriers to discharge         Co-evaluation               End of Session Equipment Utilized During Treatment: Gait belt Activity Tolerance: Patient tolerated treatment well (Self-limiting) Patient left: with call bell/phone within reach;in chair;with chair alarm set Nurse Communication: Mobility status         Time: 4166-0630 PT Time Calculation (min) (ACUTE ONLY): 27 min   Charges:   PT Evaluation $PT Eval Moderate Complexity: 1 Procedure     PT G CodesTamala Ser 01/14/2016, 1:58 PM [phone]

## 2016-01-14 NOTE — Progress Notes (Addendum)
[facility] Progress Note  Patient: Clayton [name] ZOX:[id number]   PCP: PROVIDER NOT IN SYSTEM DOB: December 02, 1951   DOA: 01/09/2016   DOS: 01/14/2016   Date of Service: the patient was seen and examined on 01/14/2016  Subjective: Continues to have improvement in breathing although refuses oxygen refuses IV sticks, does not have chest pain shortness of breath nausea or vomiting. Nutrition: Able to tolerate oral diet Activity: Bed in the chair Last BM: 01/14/2016  Assessment and Plan: 1. 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 Chronic kidney disease. Hypertensive urgency. Possible healthcare associated pneumonia. Patient's dry weight 169 pound as per 11/2015  presents with worsening breathing. Initially Requiring nonrebreather. Later on was high flow nasal cannula and currently on 2 LPM nasal cannula. Respiratory distress is improved persistent bilateral crackles but improving as well. Blood culture negative for 4 days, urine culture shows insignificant Proteus mirabilis. No sputum specimen has been collected by nursing. Patient is not allowing accuret measurements of NSAIDs and, weight improved 22 pounds.  Appreciate input from critical care. Since primary issue appears to be CHF exacerbation critical care has recommended to discontinue antibiotics. We will treat the patient with oral Lasix  80 mg twice a day and give him one dose of Zaroxolyn   2. Medical noncompliance. As per the documentation the patient has not been compliant with medical treatment even in the nursing home facility. Noncompliant with medical treatment here as well and has recurrent admissions for acute on chronic CHF. Patient continues to remain in significant respiratory distress and wants to go home and mentions that he is feeling better and perfectly fine. Appreciate psychiatric consultation. Patient does not meet criteria for capacity to make his own medical  decisions  Discussed with social worker today since the patient was attempting to leave again, social worker mentions that patient's pastor has decided to become his guardian and is agreeing with the current treatment plan.  3. Hypertensive urgency.  blood pressure appears to be stable. Continue increase hydralazine also increase Imdur. On Cardizem 120 mg every 12 hours, switch to 240 mg every 24 hours tomorrow  4. Chronic kidney disease.  Renal function functioning improving. We'll continue to monitor.  5. Diabetes mellitus with chronic kidney disease. Hemoglobin A1c December 2016 14.7. Chronically noncompliant with medical regimen. sugar here are stable, continue sliding scale insulin  6. Abdominal pain. No acute abnormality in the CT scan. Primarily volume overload. We'll continue to monitor.  7. Anemia of chronic disease. Gradually worsening over prolong period without active bleeding. Patient received 1 PRBC and repeat CBC is augmented appropriately. Will monitor daily. H&H remained stable  DVT Prophylaxis: subcutaneous Heparin Nutrition: Cardiac and diabetic diet Advance goals of care discussion: full code  Brief Summary of Hospitalization:  HPI: As per the H and P dictated on admission, "Clayton [name] is a 65 y.o. male with a past medical history significant for IDDM, CKD stage III, and HTN who presents with hypoxic respiratory failure from SNF.  The patient has been admitted to the hospital numerous times in the last 3 months. Most recently he was admitted for HCAP and CHF from 1/7 to 1/15, aggressively diuresed, treated with cefepime for 7 days and then discharged to SNF.  I called the nursing home, who stated that the patient had been there since discharge, alert and responsive, 1 assist for all ADLs, resistant to all cares, propel himself in a wheelchair, and with normal blood pressure and no fever  on all recorded vital signs in the last week. When evaluated by nursing  staff he was found to be hypoxic in the 70s, and so he was sent to the ER.  In the ED, he was hypoxic in the 70s and breathing rapidly, but refused both BiPAP and intubation.TRH were asked to evaluate for admission."  Daily update, Procedures: 01/10/2016 psychiatric consulted 01/11/2016 critical care consulted. Consultants: Psychiatric Antibiotics: Anti-infectives    Start     Dose/Rate Route Frequency Ordered Stop   01/10/16 1800  vancomycin (VANCOCIN) 1,250 mg in sodium chloride 0.9 % 250 mL IVPB  Status:  Discontinued     1,250 mg 166.7 mL/hr over 90 Minutes Intravenous Every 24 hours 01/09/16 2245 01/11/16 1129   01/10/16 0400  piperacillin-tazobactam (ZOSYN) IVPB 3.375 g  Status:  Discontinued     3.375 g 12.5 mL/hr over 240 Minutes Intravenous Every 8 hours 01/09/16 2245 01/11/16 1129   01/09/16 1800  vancomycin (VANCOCIN) 1,750 mg in sodium chloride 0.9 % 500 mL IVPB     1,750 mg 250 mL/hr over 120 Minutes Intravenous  Once 01/09/16 1749 01/09/16 2050   01/09/16 1800  piperacillin-tazobactam (ZOSYN) IVPB 3.375 g     3.375 g 100 mL/hr over 30 Minutes Intravenous  Once 01/09/16 1749 01/09/16 2019      Family Communication: no family was present at bedside, at the time of interview.   Disposition:  Barriers to safe discharge: Improvement oxygenation,   Intake/Output Summary (Last 24 hours) at 01/14/16 1612 Last data filed at 01/14/16 1500  Gross per 24 hour  Intake    360 ml  Output    900 ml  Net   -540 ml   Filed Weights   01/12/16 0459 01/13/16 1105 01/14/16 0500  Weight: 96.8 kg (213 lb 6.5 oz) 94.2 kg (207 lb 10.8 oz) 88 kg (194 lb 0.1 oz)   Objective: Physical Exam: Filed Vitals:   01/14/16 0642 01/14/16 0900 01/14/16 1349 01/14/16 1400  BP: 173/67   168/89  Pulse: 85   91  Temp: 97.8 F (36.6 C)   97.4 F (36.3 C)  TempSrc: Oral   Oral  Resp: 22   22  Height:      Weight:      SpO2: 88% 86% 80% 87%    General: Appear in mild distress, no Rash; Oral  Mucosa moist. Cardiovascular: S1 and S2 Present, no Murmur, difficult to assess JVD Respiratory: Bilateral Air entry present and bilateral rhonchi and Crackles, no wheezes Abdomen: Bowel Sound present, Soft and no tenderness Extremities: Bilateral Pedal edema, no calf tenderness  Data Reviewed: CBC:  Recent Labs Lab 01/09/16 1559  01/10/16 0348 01/11/16 0346 01/12/16 0342 01/12/16 1051 01/13/16 0349  WBC 11.8*  --  10.3 8.3 6.5 6.5 6.1  NEUTROABS 10.1*  --   --  6.8 4.8  --   --   HGB 8.2*  < > 7.4* 7.1* 6.7* 7.8* 7.7*  HCT 25.5*  < > 23.0* 21.9* 20.8* 23.5* 24.0*  MCV 88.5  --  89.5 89.0 88.1 88.0 89.2  PLT 304  --  271 257 241 264 300  < > = values in this interval not displayed. Basic Metabolic Panel:  Recent Labs Lab 01/09/16 1559 01/09/16 1651 01/10/16 0348 01/11/16 0346 01/12/16 0342 01/13/16 0349  NA 140 139 139 140 136 140  K 4.4 4.3 4.5 4.3 4.0 4.0  CL 109 108 107 106 103 106  CO2 22  --  20* 22 23  24  GLUCOSE 120* 113* 162* 214* 265* 106*  BUN 54* 47* 52* 51* 55* 46*  CREATININE 2.17* 2.00* 2.14* 2.12* 2.40* 2.03*  CALCIUM 8.7*  --  8.6* 8.6* 8.2* 8.6*  MG  --   --   --  1.9 2.1 1.9   Liver Function Tests:  Recent Labs Lab 01/09/16 1559 01/10/16 0348 01/11/16 0346  AST 49* 44* 33  ALT 50 43 37  ALKPHOS 187* 144* 136*  BILITOT 0.4 0.4 0.9  PROT 6.9 6.0* 6.0*  ALBUMIN 2.7* 2.3* 2.2*   No results for input(s): LIPASE, AMYLASE in the last 168 hours. No results for input(s): AMMONIA in the last 168 hours.  Cardiac Enzymes: No results for input(s): CKTOTAL, CKMB, CKMBINDEX, TROPONINI in the last 168 hours.  BNP (last 3 results)  Recent Labs  12/04/15 0422 12/16/15 1913 01/09/16 1559  BNP 557.6* 1348.3* 500.3*    CBG:  Recent Labs Lab 01/13/16 0944 01/13/16 1122 01/13/16 1715 01/13/16 2128 01/14/16 0801  GLUCAP 172* 220* 279* 178* 246*    Recent Results (from the past 240 hour(s))  Urine culture     Status: None   Collection  Time: 01/09/16  6:00 PM  Result Value Ref Range Status   Specimen Description URINE, RANDOM  Final   Special Requests NONE  Final   Culture   Final    30,000 COLONIES/mL PROTEUS MIRABILIS Performed at [facility]    Report Status 01/12/2016 FINAL  Final   Organism ID, Bacteria PROTEUS MIRABILIS  Final      Susceptibility   Proteus mirabilis - MIC*    AMPICILLIN <=2 SENSITIVE Sensitive     CEFAZOLIN <=4 SENSITIVE Sensitive     CEFTRIAXONE <=1 SENSITIVE Sensitive     CIPROFLOXACIN 2 INTERMEDIATE Intermediate     GENTAMICIN <=1 SENSITIVE Sensitive     IMIPENEM 2 SENSITIVE Sensitive     NITROFURANTOIN 128 RESISTANT Resistant     TRIMETH/SULFA <=20 SENSITIVE Sensitive     AMPICILLIN/SULBACTAM <=2 SENSITIVE Sensitive     PIP/TAZO <=4 SENSITIVE Sensitive     * 30,000 COLONIES/mL PROTEUS MIRABILIS  Blood culture (routine x 2)     Status: None (Preliminary result)   Collection Time: 01/09/16  6:50 PM  Result Value Ref Range Status   Specimen Description BLOOD LEFT ANTECUBITAL  Final   Special Requests IN PEDIATRIC BOTTLE 3 CC  Final   Culture   Final    NO GROWTH 4 DAYS Performed at [facility]    Report Status PENDING  Incomplete  MRSA PCR Screening     Status: None   Collection Time: 01/09/16  9:47 PM  Result Value Ref Range Status   MRSA by PCR NEGATIVE NEGATIVE Final    Comment:        The GeneXpert MRSA Assay (FDA approved for NASAL specimens only), is one component of a comprehensive MRSA colonization surveillance program. It is not intended to diagnose MRSA infection nor to guide or monitor treatment for MRSA infections.     Studies: No results found.   Scheduled Meds: . diltiazem  120 mg Oral Q12H  . ferrous sulfate  325 mg Oral Q breakfast  . furosemide  80 mg Oral BID  . glyBURIDE  10 mg Oral BID WC  . guaiFENesin  600 mg Oral BID  . haloperidol  1 mg Oral BID  . hydrALAZINE  50 mg Oral TID  . insulin aspart  0-15 Units Subcutaneous TID WC    .  insulin aspart  0-5 Units Subcutaneous QHS  . [START ON 01/15/2016] isosorbide mononitrate  30 mg Oral Daily  . sodium chloride flush  3 mL Intravenous Q12H   Continuous Infusions:  PRN Meds: haloperidol lactate, ipratropium-albuterol, LORazepam  Time spent: 30 minutes  Author: Lynden Oxford, MD [facility] Pager: [phone] 01/14/2016 4:12 PM  If 7PM-7AM, please contact night-coverage at [email or web address], password [facility]

## 2016-01-15 ENCOUNTER — Encounter: Payer: Self-pay | Admitting: Physician Assistant

## 2016-01-15 LAB — GLUCOSE, CAPILLARY: Glucose-Capillary: 193 mg/dL — ABNORMAL HIGH (ref 65–99)

## 2016-01-15 MED ORDER — FUROSEMIDE 80 MG PO TABS: 80.0000 mg | ORAL_TABLET | Freq: Two times a day (BID) | ORAL | Status: DC

## 2016-01-15 MED ORDER — ISOSORBIDE MONONITRATE ER 30 MG PO TB24: 30.0000 mg | ORAL_TABLET | Freq: Every day | ORAL | Status: AC

## 2016-01-15 MED ORDER — DILTIAZEM HCL ER COATED BEADS 240 MG PO CP24
240.0000 mg | ORAL_CAPSULE | Freq: Every day | ORAL | Status: AC
Start: 2016-01-15 — End: ?

## 2016-01-15 MED ORDER — HALOPERIDOL LACTATE 5 MG/ML IJ SOLN: 2.0000 mg | Freq: Once | INTRAMUSCULAR | Status: DC

## 2016-01-15 MED ORDER — HYDRALAZINE HCL 50 MG PO TABS: 50.0000 mg | ORAL_TABLET | Freq: Three times a day (TID) | ORAL | Status: AC

## 2016-01-15 NOTE — Progress Notes (Signed)
Patient is set to discharge back to [facility] SN[facility] today. Patient aware, message left for patient's emergency contact - Pearson Forster (ph#: [phone]). Discharge packet given to RN, Charline Bills. [facility] called for transport.     Lincoln Maxin, LCSW [facility] Clinical Social Worker cell #: [phone]

## 2016-01-15 NOTE — Progress Notes (Signed)
Inpatient Diabetes Program Recommendations  AACE/ADA: New Consensus Statement on Inpatient Glycemic Control (2015)  Target Ranges:  Prepandial:   less than 140 mg/dL      Peak postprandial:   less than 180 mg/dL (1-2 hours)      Critically ill patients:  140 - 180 mg/dL   Results for Clayton [name], Clayton [name] (MRN [id number]) as of 01/15/2016 08:53  Ref. Range 01/14/2016 08:01 01/14/2016 16:43 01/14/2016 21:05  Glucose-Capillary Latest Ref Range: 65-99 mg/dL 409 (H) 811 (H) 914 (H)    Home DM Meds: Levemir 25 units daily       Novolog SSI       Glyburide 10 mg bid  Current Insulin Orders: Novolog Moderate SSI (0-15 units) TID AC + HS      Glyburide 10 mg bid     MD- Please consider starting 80% of patient's home dose of Levemir-  Levemir 20 units daily     --Will follow patient during hospitalization--  Ambrose Finland RN, MSN, CDE Diabetes Coordinator Inpatient Glycemic Control Team Team Pager: [phone] (8a-5p)

## 2016-01-15 NOTE — Progress Notes (Signed)
    Cardiology Office Note  This encounter was created in error - please disregard.

## 2016-01-15 NOTE — Discharge Summary (Signed)
[facility] Discharge Summary   Patient: Clayton [name] RNH:[id number]   PCP: PROVIDER NOT IN SYSTEM DOB: Apr 12, 1951   Date of admission: 01/09/2016   Date of discharge: 01/15/2016    Discharge Diagnoses:  Principal Problem:   Acute respiratory failure with hypoxia ([place]) Active Problems:   Noncompliance with medications   Acute renal failure superimposed on stage 3 chronic kidney disease ([place])   Essential hypertension   Hypertensive urgency   Acute on chronic diastolic CHF (congestive heart failure) ([place])   HCAP (healthcare-associated pneumonia)   CKD (chronic kidney disease)   Diabetes mellitus with complication ([place])   Homeless   Anemia of chronic disease  Recommendations for Outpatient Follow-up:  1. Please follow up with PCP in 1 week as well as establish care with heart failure clinic and nephrology 2. [facility]  Diet recommendation: cardiac and carb modified diet  Activity: The patient is advised to gradually reintroduce usual activities.  Discharge Condition: good  History of present illness: As per the H and P dictated on admission, "Clayton [name] is a 65 y.o. male with a past medical history significant for IDDM, CKD stage III, and HTN who presents with hypoxic respiratory failure from SNF.  The patient has been admitted to the hospital numerous times in the last 3 months. Most recently he was admitted for HCAP and CHF from 1/7 to 1/15, aggressively diuresed, treated with cefepime for 7 days and then discharged to SNF. Of note, the patient was lethargic when I examined him, and only able to provide limited history.   I called the nursing home, who stated that the patient had been there since discharge, alert and responsive, 1 assist for all ADLs, resistant to all cares, propel himself in a wheelchair, and with normal blood pressure and no fever on all recorded vital signs in the last week. Today he he was alert and oriented but had an episode of  incontinence early in the day and subsequently stated that he had stomach pain and malaise. When evaluated by nursing staff he was found to be hypoxic in the 70s, and so he was sent to the ER.  In the ED, he was hypoxic in the 70s and breathing rapidly, but refused both BiPAP and intubation. He had a low-grade fever and tachycardia, a chest x-ray that showed bilateral infiltrates and marked LE swelling and JVD. Cr 2.17, WBC 11.8K, and BNP 500. Lactate normal. AST/Alk phos minimally elevated and patient complaining of diffuse abdominal discomfort. Lasix and broad spectrum antibiotics were administered and TRH were asked to evaluate for admission."  Hospital Course:  Summary of his active problems in the hospital is as following. 1. 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 Chronic kidney disease. Hypertensive urgency. Possible healthcare associated pneumonia. Patient's dry weight 169 pound as per 11/2015  presents with worsening breathing. Initially Requiring nonrebreather. Later on was high flow nasal cannula and currently on room air. Respiratory distress is improved persistent bilateral crackles but improving as well. Blood culture negative for 5 days, urine culture shows insignificant Proteus mirabilis. No sputum specimen has been collected by nursing. Patient is not allowing accurate measurements of ins and out, weight improved 22 pounds.  Appreciate input from critical care. Since primary issue appears to be CHF exacerbation critical care has recommended to discontinue antibiotics. initially was on 80 tid IV lasix and later on also given one dose of oral metolazone, We will treat the patient with oral Lasix  80 mg twice a day on discharge.  2. Medical noncompliance. As per the documentation the patient has not been compliant with medical treatment even in the nursing home facility. Noncompliant with medical treatment here as well and has  recurrent admissions for acute on chronic CHF. While the Patient was in significant respiratory distress, he continued request to go home and mentions that he is feeling better and perfectly fine. Appreciate psychiatric consultation. As per the consultation, Patient does not meet criteria for capacity to make his own medical decisions. Discussed with Education officer, museum, who mentions that patient's pastor has decided to become his guardian and is agreeing with the current treatment plan.  3. Hypertensive urgency.  blood pressure appears to be stable. Continue increase hydralazine also increase Imdur. On Cardizem 120 mg every 12 hours, switch to 240 mg every 24 hours on discharge starting 01/16/2016.  4. Chronic kidney disease.  Renal function functioning improving. Outpatient nephrology referral  5. Diabetes mellitus with chronic kidney disease. Hemoglobin A1c December 2016 14.7. Chronically noncompliant with medical regimen. sugar here are stable, continue sliding scale insulin  6. Abdominal pain. No acute abnormality in the CT scan. Primarily volume overload.  7. Anemia of chronic disease. Gradually worsening over prolong period without active bleeding. Patient received 1 PRBC and repeat CBC is augmented appropriately.   All other chronic medical condition were stable during the hospitalization.  Patient was seen by physical therapy, who recommended SNF, which was arranged by Education officer, museum and case Freight forwarder. On the day of the discharge the patient's oxygenation remained stable, and no other acute medical condition were reported by patient. the patient was felt safe to be discharge at SNF with therapy.  Procedures and Results:  none   Consultations:  PCCM  DISCHARGE MEDICATION: Current Discharge Medication List    START taking these medications   Details  diltiazem (CARTIA XT) 240 MG 24 hr capsule Take 1 capsule (240 mg total) by mouth daily. Qty: 30 capsule, Refills: 0      isosorbide mononitrate (IMDUR) 30 MG 24 hr tablet Take 1 tablet (30 mg total) by mouth daily. Qty: 30 tablet, Refills: 0      CONTINUE these medications which have CHANGED   Details  furosemide (LASIX) 80 MG tablet Take 1 tablet (80 mg total) by mouth 2 (two) times daily. Qty: 30 tablet, Refills: 0    hydrALAZINE (APRESOLINE) 50 MG tablet Take 1 tablet (50 mg total) by mouth 3 (three) times daily.      CONTINUE these medications which have NOT CHANGED   Details  glyBURIDE (DIABETA) 5 MG tablet Take 2 tablets (10 mg total) by mouth 2 (two) times daily with a meal. Qty: 60 tablet, Refills: 0    haloperidol (HALDOL) 1 MG tablet Take 1 mg by mouth 2 (two) times daily.    insulin aspart (NOVOLOG) 100 UNIT/ML injection 0-15 Units, Subcutaneous, 3 times daily with meals CBG < 70: implement hypoglycemia protocol CBG 70 - 120: 0 units CBG 121 - 150: 2 units CBG 151 - 200: 3 units CBG 201 - 250: 5 units CBG 251 - 300: 8 units CBG 301 - 350: 11 units CBG 351 - 400: 15 units CBG > 400: call MD Qty: 10 mL, Refills: 11    insulin detemir (LEVEMIR) 100 UNIT/ML injection Inject 0.25 mLs (25 Units total) into the skin daily.    LORazepam (ATIVAN) 0.5 MG tablet Take 0.5 mg by mouth 2 (two) times daily as needed for anxiety (breakthrough anxiety).  ferrous sulfate 325 (65 FE) MG tablet Take 1 tablet (325 mg total) by mouth daily with breakfast. Qty: 30 tablet, Refills: 0    guaiFENesin (MUCINEX) 600 MG 12 hr tablet Take 1 tablet (600 mg total) by mouth 2 (two) times daily as needed.    ipratropium-albuterol (DUONEB) 0.5-2.5 (3) MG/3ML SOLN Take 3 mLs by nebulization every 4 (four) hours as needed.    potassium chloride (K-DUR) 10 MEQ tablet Take 2 tablets (20 mEq total) by mouth daily.      STOP taking these medications     diltiazem (CARDIZEM SR) 120 MG 12 hr capsule        Allergies  Allergen Reactions  . Latex Rash   Discharge Instructions    Avoid straining    Complete  by:  As directed      Diet - low sodium heart healthy    Complete by:  As directed      Diet Carb Modified    Complete by:  As directed      Heart Failure patients record your daily weight using the same scale at the same time of day    Complete by:  As directed      Increase activity slowly    Complete by:  As directed           Discharge Exam: Filed Weights   01/13/16 1105 01/14/16 0500 01/15/16 0515  Weight: 94.2 kg (207 lb 10.8 oz) 88 kg (194 lb 0.1 oz) 90.81 kg (200 lb 3.2 oz)   Filed Vitals:   01/14/16 2109 01/15/16 0515  BP: 169/82 168/86  Pulse: 89 76  Temp: 97.5 F (36.4 C) 97.5 F (36.4 C)  Resp: 20 20   General: Appear in mild distress, no Rash; Oral Mucosa moist. Cardiovascular: S1 and S2 Present, no Murmur, no JVD Respiratory: Bilateral Air entry present and faint basal Crackles, no wheezes Abdomen: Bowel Sound present, Soft and no tenderness Extremities: trace Pedal edema, no calf tenderness Neurology: Grossly no focal neuro deficit.  The results of significant diagnostics from this hospitalization (including imaging, microbiology, ancillary and laboratory) are listed below for reference.    Significant Diagnostic Studies: Ct Abdomen Pelvis Wo Contrast  01/10/2016  CLINICAL DATA:  Abdominal pain. EXAM: CT ABDOMEN AND PELVIS WITHOUT CONTRAST TECHNIQUE: Multidetector CT imaging of the abdomen and pelvis was performed following the standard protocol without IV contrast. COMPARISON:  CT 09/17/2015 FINDINGS: Lower chest: Small to moderate bilateral pleural effusions, left greater than right. Ground-glass opacities in the included lung bases, left greater than right, suspect pulmonary edema. The heart is enlarged. Liver: No focal lesion allowing for lack contrast. Hepatobiliary: Gallbladder physiologically distended, no calcified stone. No biliary dilatation. Pancreas: Not well-defined.  No evidence pancreatitis. Spleen: Normal in size. Adrenal glands: No nodule. Kidneys:  No hydronephrosis.  No localizing renal abnormality. Stomach/Bowel: Stomach is decompressed. There are no dilated or thickened small bowel loops. Small-moderate volume of stool throughout the colon without colonic wall thickening. The appendix is not seen. Vascular/Lymphatic: No retroperitoneal adenopathy. Shotty inguinal and external iliac nodes. Abdominal aorta is normal in caliber, tortuous in its course. Mild atherosclerosis Reproductive: Prostate gland normal in size. Bladder: Decompressed by Foley catheter, however diffuse wall thickening is seen. Other: Diffuse whole body wall edema. Fat in both inguinal canals. No free air, free fluid, or intra-abdominal fluid collection. Musculoskeletal: There are no acute or suspicious osseous abnormalities. Bilateral L5 pars interarticularis defects without listhesis. IMPRESSION: 1. Findings consistent with fluid overload with  bilateral pleural effusions and whole body wall edema. 2. Diffuse bladder wall thickening, correlation with urinalysis recommended to exclude urinary tract infection. Electronically Signed   By: Jeb Levering M.D.   On: 01/10/2016 02:32   Dg Chest 2 View  12/19/2015  CLINICAL DATA:  Shortness of breath, confusion EXAM: CHEST  2 VIEW COMPARISON:  12/16/2015 FINDINGS: Multifocal patchy opacities, suspicious for multifocal pneumonia, less likely moderate interstitial edema. No definite pleural effusions. No pneumothorax. Cardiomegaly. IMPRESSION: Multifocal patchy opacities, suspicious for multifocal pneumonia, grossly unchanged. Electronically Signed   By: Julian Hy M.D.   On: 12/19/2015 11:14   Ct Head Wo Contrast  12/18/2015  CLINICAL DATA:  [place] and hit back of head today. Unable pupils, weakness. History of chronic kidney disease, hypertension, diabetes, migraine. EXAM: CT HEAD WITHOUT CONTRAST TECHNIQUE: Contiguous axial images were obtained from the base of the skull through the vertex without intravenous contrast. COMPARISON:   CT head October 31, 2015 FINDINGS: The ventricles and sulci are mildly prominent for age. No intraparenchymal hemorrhage, mass effect nor midline shift. Patchy supratentorial white matter hypodensities. No acute large vascular territory infarcts. No abnormal extra-axial fluid collections. Basal cisterns are patent. Moderate calcific atherosclerosis of the carotid siphons. No skull fracture. LEFT occipital scalp thickening was present on prior examination. The included ocular globes and orbital contents are non-suspicious. Old RIGHT medial orbital blowout fracture. Old nondisplaced bilateral nasal bone fractures. Maxillary mucosal retention cysts. Ethmoid mucosal thickening. IMPRESSION: Acute intracranial process. Mild global parenchymal brain volume loss for age. Mild chronic small vessel ischemic disease. Electronically Signed   By: Elon Alas M.D.   On: 12/18/2015 23:00   Dg Chest Port 1 View  01/11/2016  CLINICAL DATA:  Hypoxia and pneumonia. EXAM: PORTABLE CHEST 1 VIEW COMPARISON:  01/09/2016 FINDINGS: Lungs are hypoinflated and demonstrate continued consolidation over the right upper lobe without significant change with patchy opacification over the right midlung central left lung slightly worse compatible patient's known pneumonia. No evidence of effusion. Mild stable cardiomegaly. Remainder of the exam is unchanged. IMPRESSION: Multifocal airspace process without significant change over the right upper lobe and mild worsening over the right mid to lower lung and central left lung compatible with pneumonia. Electronically Signed   By: Marin Olp M.D.   On: 01/11/2016 11:59   Dg Chest Port 1 View  01/09/2016  CLINICAL DATA:  Shortness of breath and headache today. EXAM: PORTABLE CHEST 1 VIEW COMPARISON:  12/22/2015. FINDINGS: The heart is mildly enlarged but stable. The left IJ catheter has been removed. There is a predominantly perihilar pattern of asymmetric airspace disease which could be  asymmetric pulmonary edema or possibly bilateral infiltrates. There is also a left pleural effusion. The bony thorax is intact. IMPRESSION: Asymmetric bilateral airspace process, pulmonary edema versus infiltrates. Small left effusion. Electronically Signed   By: Marijo Sanes M.D.   On: 01/09/2016 16:49   Dg Chest Port 1 View  12/22/2015  CLINICAL DATA:  Chronic CHF, acute respiratory failure with healthcare associated pneumonia, acute and chronic renal failure, diabetes EXAM: PORTABLE CHEST 1 VIEW COMPARISON:  Portable chest x-ray of December 20, 2015 FINDINGS: The lungs are well-expanded. Confluent alveolar opacities persist but have become less conspicuous. The cardiac silhouette remains enlarged. The pulmonary vascularity remains engorged. There is no pleural effusion. The left internal jugular venous catheter tip is stable projecting over the junction of the middle and distal thirds of the SVC. IMPRESSION: Slight interval improvement in the bilateral alveolar infiltrates consistent with improving  pneumonia and CHF. Electronically Signed   By: David  Martinique M.D.   On: 12/22/2015 08:08   Dg Chest Portable 1 View  12/20/2015  CLINICAL DATA:  Central line placement EXAM: PORTABLE CHEST 1 VIEW COMPARISON:  12/19/2015 FINDINGS: Bilateral patchy interstitial and alveolar airspace opacities. No significant pleural effusion. No pneumothorax. Stable cardiomediastinal silhouette. Left jugular central venous catheter with the tip projecting over the cavoatrial junction. No acute osseous abnormality. IMPRESSION: 1. Left jugular central venous catheter with the tip projecting over the cavoatrial junction. No pneumothorax. 2. Bilateral patchy interstitial and alveolar airspace opacities which may reflect pulmonary edema versus multilobar pneumonia. Electronically Signed   By: Kathreen Devoid   On: 12/20/2015 14:12   Dg Chest Portable 1 View  12/16/2015  CLINICAL DATA:  Hypertension.  Hyperglycemia. EXAM: PORTABLE CHEST 1  VIEW COMPARISON:  12/13/2015 FINDINGS: Heart size is mildly enlarged. The lung volumes are low. There are diffuse bilateral lung opacities which may reflect multifocal infection or diffuse pulmonary edema. When compared with previous exam the aeration to lungs have worsened in the interval. IMPRESSION: 1. Worsening bilateral lung opacities compatible with multifocal infection and/or edema. Electronically Signed   By: Kerby Moors M.D.   On: 12/16/2015 16:28    Microbiology: Recent Results (from the past 240 hour(s))  Urine culture     Status: None   Collection Time: 01/09/16  6:00 PM  Result Value Ref Range Status   Specimen Description URINE, RANDOM  Final   Special Requests NONE  Final   Culture   Final    30,000 COLONIES/mL PROTEUS MIRABILIS Performed at [facility]    Report Status 01/12/2016 FINAL  Final   Organism ID, Bacteria PROTEUS MIRABILIS  Final      Susceptibility   Proteus mirabilis - MIC*    AMPICILLIN <=2 SENSITIVE Sensitive     CEFAZOLIN <=4 SENSITIVE Sensitive     CEFTRIAXONE <=1 SENSITIVE Sensitive     CIPROFLOXACIN 2 INTERMEDIATE Intermediate     GENTAMICIN <=1 SENSITIVE Sensitive     IMIPENEM 2 SENSITIVE Sensitive     NITROFURANTOIN 128 RESISTANT Resistant     TRIMETH/SULFA <=20 SENSITIVE Sensitive     AMPICILLIN/SULBACTAM <=2 SENSITIVE Sensitive     PIP/TAZO <=4 SENSITIVE Sensitive     * 30,000 COLONIES/mL PROTEUS MIRABILIS  Blood culture (routine x 2)     Status: None   Collection Time: 01/09/16  6:50 PM  Result Value Ref Range Status   Specimen Description BLOOD LEFT ANTECUBITAL  Final   Special Requests IN PEDIATRIC BOTTLE 3 CC  Final   Culture   Final    NO GROWTH 5 DAYS Performed at [facility]    Report Status 01/14/2016 FINAL  Final  MRSA PCR Screening     Status: None   Collection Time: 01/09/16  9:47 PM  Result Value Ref Range Status   MRSA by PCR NEGATIVE NEGATIVE Final    Comment:        The GeneXpert MRSA Assay  (FDA approved for NASAL specimens only), is one component of a comprehensive MRSA colonization surveillance program. It is not intended to diagnose MRSA infection nor to guide or monitor treatment for MRSA infections.      Labs: CBC:  Recent Labs Lab 01/09/16 1559  01/10/16 0348 01/11/16 0346 01/12/16 0342 01/12/16 1051 01/13/16 0349  WBC 11.8*  --  10.3 8.3 6.5 6.5 6.1  NEUTROABS 10.1*  --   --  6.8 4.8  --   --  HGB 8.2*  < > 7.4* 7.1* 6.7* 7.8* 7.7*  HCT 25.5*  < > 23.0* 21.9* 20.8* 23.5* 24.0*  MCV 88.5  --  89.5 89.0 88.1 88.0 89.2  PLT 304  --  271 257 241 264 300  < > = values in this interval not displayed. Basic Metabolic Panel:  Recent Labs Lab 01/09/16 1559 01/09/16 1651 01/10/16 0348 01/11/16 0346 01/12/16 0342 01/13/16 0349  NA 140 139 139 140 136 140  K 4.4 4.3 4.5 4.3 4.0 4.0  CL 109 108 107 106 103 106  CO2 22  --  20* '22 23 24  ' GLUCOSE 120* 113* 162* 214* 265* 106*  BUN 54* 47* 52* 51* 55* 46*  CREATININE 2.17* 2.00* 2.14* 2.12* 2.40* 2.03*  CALCIUM 8.7*  --  8.6* 8.6* 8.2* 8.6*  MG  --   --   --  1.9 2.1 1.9   Liver Function Tests:  Recent Labs Lab 01/09/16 1559 01/10/16 0348 01/11/16 0346  AST 49* 44* 33  ALT 50 43 37  ALKPHOS 187* 144* 136*  BILITOT 0.4 0.4 0.9  PROT 6.9 6.0* 6.0*  ALBUMIN 2.7* 2.3* 2.2*   No results for input(s): LIPASE, AMYLASE in the last 168 hours. No results for input(s): AMMONIA in the last 168 hours. Cardiac Enzymes: No results for input(s): CKTOTAL, CKMB, CKMBINDEX, TROPONINI in the last 168 hours. BNP (last 3 results)  Recent Labs  12/04/15 0422 12/16/15 1913 01/09/16 1559  BNP 557.6* 1348.3* 500.3*   CBG:  Recent Labs Lab 01/13/16 2128 01/14/16 0801 01/14/16 1643 01/14/16 2105 01/15/16 1011  GLUCAP 178* 246* 247* 213* 193*   Time spent: 30 minutes  Signed:  Favor Hackler  [facility] 01/15/2016, 11:12 AM

## 2016-01-15 NOTE — Progress Notes (Signed)
Pt requested to get over to window seat while sheets were changed and scrubs changed. Then refused to get back to bed or to the chair. Pt is a high fall risk. Educated on fall risk and pt began yelling at NT. Left at window seat. Will check pt frequently. Julio Sicks RN

## 2016-01-15 NOTE — NC FL2 (Signed)
[place] MEDICAID FL2 LEVEL OF CARE SCREENING TOOL     IDENTIFICATION  Patient Name: Clayton [name] Birthdate: [birth year] Sex: male Admission Date (Current Location): 01/09/2016  [facility] and [place] Number:  Producer, television/film/video and Address:  [facility], [address] [place], [place] [address]      Provider Number: [id number]  Attending Physician Name and Address:  Rolly Salter, MD  Relative Name and Phone Number:  Pearson Forster [phone]    Current Level of Care: Hospital Recommended Level of Care: Skilled Nursing Facility Prior Approval Number:    Date Approved/Denied:   PASRR Number: [id number]  Discharge Plan: SNF    Current Diagnoses: Patient Active Problem List   Diagnosis Date Noted  . Acute pulmonary edema (HCC) 01/09/2016  . Type 2 diabetes mellitus with hyperosmolar nonketotic hyperglycemia (HCC) 12/16/2015  . Acute respiratory failure with hypoxia (HCC) 12/16/2015  . Prolonged Q-T interval on ECG 12/16/2015  . CKD (chronic kidney disease) 12/11/2015  . Diabetes mellitus with complication (HCC) 12/11/2015  . Homeless 12/11/2015  . Anemia of chronic disease 12/11/2015  . Abnormal chest x-ray 12/06/2015  . Hyperglycemia 12/04/2015  . Hypertensive urgency 12/04/2015  . Acute on chronic diastolic CHF (congestive heart failure) (HCC) 12/04/2015  . HCAP (healthcare-associated pneumonia)   . Cocaine abuse 10/08/2015  . Acute renal failure superimposed on stage 3 chronic kidney disease (HCC) 08/17/2015  . Essential hypertension   . Noncompliance with medications 12/13/2011  . Alcohol abuse 12/13/2011  . Hyponatremia 12/01/2011    Orientation RESPIRATION BLADDER Height & Weight     Self    Incontinent Weight: 200 lb 3.2 oz (90.81 kg) Height:   (180.3 cm)  BEHAVIORAL SYMPTOMS/MOOD NEUROLOGICAL BOWEL NUTRITION STATUS      Incontinent  (please see discharge summary for updated diet )  AMBULATORY STATUS  COMMUNICATION OF NEEDS Skin   Extensive Assist Verbally Normal                       Personal Care Assistance Level of Assistance  Bathing, Feeding, Dressing Bathing Assistance: Limited assistance Feeding assistance: Independent (supervision) Dressing Assistance: Limited assistance     Functional Limitations Info             SPECIAL CARE FACTORS FREQUENCY  PT (By licensed PT)     PT Frequency: daily              Contractures Contractures Info: Not present    Additional Factors Info  Code Status, Allergies, Insulin Sliding Scale Code Status Info: FULL Allergies Info: latex   Insulin Sliding Scale Info: insuline aspart (novoLOG) injection -15 units 3 times daily       Current Medications (01/15/2016):  This is the current hospital active medication list Current Facility-Administered Medications  Medication Dose Route Frequency Provider Last Rate Last Dose  . diltiazem (CARDIZEM SR) 12 hr capsule 120 mg  120 mg Oral Q12H Alberteen Sam, MD   120 mg at 01/14/16 2144  . ferrous sulfate tablet 325 mg  325 mg Oral Q breakfast Rolly Salter, MD   325 mg at 01/14/16 0843  . furosemide (LASIX) tablet 80 mg  80 mg Oral BID Rolly Salter, MD   80 mg at 01/14/16 1712  . glyBURIDE (DIABETA) tablet 10 mg  10 mg Oral BID WC Rolly Salter, MD   10 mg at 01/14/16 1712  . guaiFENesin (MUCINEX) 12 hr tablet 600  mg  600 mg Oral BID Rolly Salter, MD   600 mg at 01/14/16 2143  . haloperidol (HALDOL) tablet 1 mg  1 mg Oral BID Rolly Salter, MD   1 mg at 01/14/16 2143  . haloperidol lactate (HALDOL) injection 2 mg  2 mg Intravenous Q6H PRN Rolly Salter, MD      . haloperidol lactate (HALDOL) injection 2 mg  2 mg Intramuscular Once Vernona Rieger A [place], PA-C      . hydrALAZINE (APRESOLINE) tablet 50 mg  50 mg Oral TID Rolly Salter, MD   50 mg at 01/14/16 2143  . insulin aspart (novoLOG) injection 0-15 Units  0-15 Units Subcutaneous TID WC Rolly Salter, MD   5 Units at  01/14/16 1711  . insulin aspart (novoLOG) injection 0-5 Units  0-5 Units Subcutaneous QHS Rolly Salter, MD   2 Units at 01/14/16 2144  . ipratropium-albuterol (DUONEB) 0.5-2.5 (3) MG/3ML nebulizer solution 3 mL  3 mL Nebulization Q4H PRN Rolly Salter, MD      . isosorbide mononitrate (IMDUR) 24 hr tablet 30 mg  30 mg Oral Daily Rolly Salter, MD      . LORazepam (ATIVAN) tablet 0.5 mg  0.5 mg Oral BID PRN Rolly Salter, MD   0.5 mg at 01/14/16 2350  . sodium chloride flush (NS) 0.9 % injection 3 mL  3 mL Intravenous Q12H Alberteen Sam, MD   3 mL at 01/13/16 1914     Discharge Medications: Please see discharge summary for a list of discharge medications.  Relevant Imaging Results:  Relevant Lab Results:   Additional Information SSN [id number]  Arlyss Repress, LCSW

## 2016-01-28 ENCOUNTER — Emergency Department (HOSPITAL_COMMUNITY): Payer: Medicaid Other

## 2016-01-28 ENCOUNTER — Encounter (HOSPITAL_COMMUNITY): Payer: Self-pay | Admitting: *Deleted

## 2016-01-28 ENCOUNTER — Emergency Department (HOSPITAL_COMMUNITY)
Admission: EM | Admit: 2016-01-28 | Discharge: 2016-01-29 | Disposition: A | Discharge status: Home / Self Care | Payer: Medicaid Other | Attending: Emergency Medicine | Admitting: Emergency Medicine

## 2016-01-28 DIAGNOSIS — Z8701 Personal history of pneumonia (recurrent): Secondary | ICD-10-CM | POA: Diagnosis not present

## 2016-01-28 DIAGNOSIS — Z8719 Personal history of other diseases of the digestive system: Secondary | ICD-10-CM | POA: Diagnosis not present

## 2016-01-28 DIAGNOSIS — D649 Anemia, unspecified: Secondary | ICD-10-CM | POA: Diagnosis not present

## 2016-01-28 DIAGNOSIS — I129 Hypertensive chronic kidney disease with stage 1 through stage 4 chronic kidney disease, or unspecified chronic kidney disease: Secondary | ICD-10-CM | POA: Insufficient documentation

## 2016-01-28 DIAGNOSIS — R0602 Shortness of breath: Secondary | ICD-10-CM | POA: Insufficient documentation

## 2016-01-28 DIAGNOSIS — Z79899 Other long term (current) drug therapy: Secondary | ICD-10-CM | POA: Insufficient documentation

## 2016-01-28 DIAGNOSIS — N183 Chronic kidney disease, stage 3 (moderate): Secondary | ICD-10-CM | POA: Diagnosis not present

## 2016-01-28 DIAGNOSIS — Z7984 Long term (current) use of oral hypoglycemic drugs: Secondary | ICD-10-CM | POA: Insufficient documentation

## 2016-01-28 DIAGNOSIS — F17210 Nicotine dependence, cigarettes, uncomplicated: Secondary | ICD-10-CM | POA: Insufficient documentation

## 2016-01-28 DIAGNOSIS — Z91040 Latex allergy status: Secondary | ICD-10-CM | POA: Insufficient documentation

## 2016-01-28 DIAGNOSIS — E11649 Type 2 diabetes mellitus with hypoglycemia without coma: Secondary | ICD-10-CM | POA: Diagnosis present

## 2016-01-28 DIAGNOSIS — E162 Hypoglycemia, unspecified: Secondary | ICD-10-CM

## 2016-01-28 DIAGNOSIS — Z794 Long term (current) use of insulin: Secondary | ICD-10-CM | POA: Insufficient documentation

## 2016-01-28 DIAGNOSIS — F329 Major depressive disorder, single episode, unspecified: Secondary | ICD-10-CM | POA: Diagnosis not present

## 2016-01-28 LAB — BASIC METABOLIC PANEL
Anion gap: 9 (ref 5–15)
BUN: 52 mg/dL — AB (ref 6–20)
CHLORIDE: 108 mmol/L (ref 101–111)
CO2: 22 mmol/L (ref 22–32)
CREATININE: 1.91 mg/dL — AB (ref 0.61–1.24)
Calcium: 8.8 mg/dL — ABNORMAL LOW (ref 8.9–10.3)
GFR calc Af Amer: 42 mL/min — ABNORMAL LOW (ref >60)
GFR calc non Af Amer: 36 mL/min — ABNORMAL LOW (ref >60)
Glucose, Bld: 80 mg/dL (ref 65–99)
POTASSIUM: 4.7 mmol/L (ref 3.5–5.1)
SODIUM: 139 mmol/L (ref 135–145)

## 2016-01-28 LAB — CBC WITH DIFFERENTIAL/PLATELET
Basophils Absolute: 0 10*3/uL (ref 0.0–0.1)
Basophils Relative: 0 %
EOS ABS: 0.2 10*3/uL (ref 0.0–0.7)
Eosinophils Relative: 2 %
HEMATOCRIT: 28.7 % — AB (ref 39.0–52.0)
HEMOGLOBIN: 9.2 g/dL — AB (ref 13.0–17.0)
LYMPHS ABS: 1 10*3/uL (ref 0.7–4.0)
LYMPHS PCT: 11 %
MCH: 28.8 pg (ref 26.0–34.0)
MCHC: 32.1 g/dL (ref 30.0–36.0)
MCV: 89.7 fL (ref 78.0–100.0)
MONOS PCT: 3 %
Monocytes Absolute: 0.3 10*3/uL (ref 0.1–1.0)
NEUTROS ABS: 8.1 10*3/uL — AB (ref 1.7–7.7)
NEUTROS PCT: 84 %
Platelets: 312 10*3/uL (ref 150–400)
RBC: 3.2 MIL/uL — AB (ref 4.22–5.81)
RDW: 15.5 % (ref 11.5–15.5)
WBC: 9.7 10*3/uL (ref 4.0–10.5)

## 2016-01-28 LAB — BRAIN NATRIURETIC PEPTIDE: B NATRIURETIC PEPTIDE 5: 457.9 pg/mL — AB (ref 0.0–100.0)

## 2016-01-28 LAB — CBG MONITORING, ED
Glucose-Capillary: 120 mg/dL — ABNORMAL HIGH (ref 65–99)
Glucose-Capillary: 100 mg/dL — ABNORMAL HIGH (ref 65–99)

## 2016-01-28 LAB — I-STAT CG4 LACTIC ACID, ED: LACTIC ACID, VENOUS: 0.54 mmol/L (ref 0.5–2.0)

## 2016-01-28 NOTE — ED Notes (Signed)
Called [facility] to give report. Nurse put on hold for over 5 min.

## 2016-01-28 NOTE — ED Notes (Signed)
Deborah LPN was called at [facility]

## 2016-01-28 NOTE — ED Notes (Signed)
Pt was once again found ambulating in the hallway stating he wants to "go to the front to wait for his cab"  Pt is wanted to go to [facility] and has been informed going to [facility].  Pt attempted to strike staff.  Security called and pt was put back into his room.

## 2016-01-28 NOTE — ED Notes (Signed)
Bed: WA07 Expected date:  Expected time:  Means of arrival:  Comments: EMS 64yo Hypoglycemia

## 2016-01-28 NOTE — ED Notes (Signed)
Pt ambulating in the room and shouting "I need clothes so I can go home"  Pt was informed he is not up for discharge yet and that we need to check his blood sugar.

## 2016-01-28 NOTE — Discharge Instructions (Signed)

## 2016-01-28 NOTE — ED Notes (Signed)
Per EMS report: pt coming from [facility]. Pt had an episode of hypoglycemia.  Staff also reports hypertension.  Initial CBG was 22.  EMS gave pt glucagon, an amp of D10. Pt's recheck CBG for EMS was 129. Pt a/o x 3.

## 2016-01-28 NOTE — ED Provider Notes (Signed)
CSN: [id number]     Arrival date & time 01/28/16  2014 History   First MD Initiated Contact with Patient 01/28/16 2108     Chief Complaint  Patient presents with  . Hypoglycemia     (Consider location/radiation/quality/duration/timing/severity/associated sxs/prior Treatment) HPI Comments: Patient presents to the emergency department with chief complaint of hypoglycemia. He is brought by EMS from [facility] skilled nursing facility. Reportedly, patient had a CBG of 22. He was given glucagon and an amp of D 10. Recheck by EMS showed CBG of 129. Patient states that he has been taking his insulin, but that he did not eat anything since breakfast this morning. Additionally, he states that he has had some cough, and shortness of breath. He was recently admitted to the hospital for respiratory distress. He denies any fevers, but states he has had some chills. He also reports that he has had some vomiting. There are no aggravating or alleviating factors.  The history is provided by the patient. No language interpreter was used.    Past Medical History  Diagnosis Date  . CKD (chronic kidney disease), stage III   . Anemia   . HTN (hypertension)   . Type II diabetes mellitus (HCC)   . DVT (deep venous thrombosis) (HCC)     LLE  . Pneumonia 12/06/2015  . GERD (gastroesophageal reflux disease)   . Migraine     "a few times/month" (12/06/2015)  . Depression    Past Surgical History  Procedure Laterality Date  . No past surgeries     Family History  Problem Relation Age of Onset  . Diabetes Mellitus II Mother   . Diabetes Mellitus II Father   . Stroke Maternal Grandfather    Social History  Substance Use Topics  . Smoking status: Current Every Day Smoker -- 0.25 packs/day for 48 years    Types: Cigarettes  . Smokeless tobacco: Never Used  . Alcohol Use: Yes    Review of Systems  Constitutional: Negative for fever and chills.  Respiratory: Positive for cough and shortness of breath.    Cardiovascular: Negative for chest pain.  Gastrointestinal: Positive for nausea and vomiting. Negative for diarrhea and constipation.  Genitourinary: Negative for dysuria.  All other systems reviewed and are negative.     Allergies  Latex  Home Medications   Prior to Admission medications   Medication Sig Start Date End Date Taking? Authorizing Provider  diltiazem (CARTIA XT) 240 MG 24 hr capsule Take 1 capsule (240 mg total) by mouth daily. 01/15/16  Yes Rolly Salter, MD  ferrous sulfate 325 (65 FE) MG tablet Take 1 tablet (325 mg total) by mouth daily with breakfast. 12/13/15  Yes Rolly Salter, MD  furosemide (LASIX) 80 MG tablet Take 1 tablet (80 mg total) by mouth 2 (two) times daily. 01/15/16  Yes Rolly Salter, MD  glyBURIDE (DIABETA) 5 MG tablet Take 2 tablets (10 mg total) by mouth 2 (two) times daily with a meal. 12/05/15  Yes Alison Murray, MD  guaiFENesin (MUCINEX) 600 MG 12 hr tablet Take 1 tablet (600 mg total) by mouth 2 (two) times daily as needed. Patient taking differently: Take 600 mg by mouth 2 (two) times daily as needed for cough.  12/24/15  Yes Shanker Levora Dredge, MD  haloperidol (HALDOL) 1 MG tablet Take 1 mg by mouth 2 (two) times daily.   Yes Historical Provider, MD  hydrALAZINE (APRESOLINE) 50 MG tablet Take 1 tablet (50 mg total) by mouth  3 (three) times daily. 01/15/16  Yes Rolly Salter, MD  insulin detemir (LEVEMIR) 100 UNIT/ML injection Inject 0.25 mLs (25 Units total) into the skin daily. 12/24/15  Yes Shanker Levora Dredge, MD  insulin lispro (HUMALOG) 100 UNIT/ML injection Inject 2-10 Units into the skin 3 (three) times daily with meals. Per sliding scale: 201-250= 2 units; 251-300= 4 units; 301-350= 6 units; 351-400= 8 units; if above 401= 10 units, recheck in 1 hours if still above 401 call MD.   Yes Historical Provider, MD  ipratropium-albuterol (DUONEB) 0.5-2.5 (3) MG/3ML SOLN Take 3 mLs by nebulization every 4 (four) hours as needed. Patient taking  differently: Take 3 mLs by nebulization every 4 (four) hours as needed (shortness of breath).  12/24/15  Yes Shanker Levora Dredge, MD  isosorbide mononitrate (IMDUR) 30 MG 24 hr tablet Take 1 tablet (30 mg total) by mouth daily. 01/15/16  Yes Rolly Salter, MD  LORazepam (ATIVAN) 0.5 MG tablet Take 0.5 mg by mouth 2 (two) times daily as needed for anxiety (breakthrough anxiety).   Yes Historical Provider, MD  potassium chloride (K-DUR) 10 MEQ tablet Take 2 tablets (20 mEq total) by mouth daily. 12/24/15  Yes Shanker Levora Dredge, MD  insulin aspart (NOVOLOG) 100 UNIT/ML injection 0-15 Units, Subcutaneous, 3 times daily with meals CBG < 70: implement hypoglycemia protocol CBG 70 - 120: 0 units CBG 121 - 150: 2 units CBG 151 - 200: 3 units CBG 201 - 250: 5 units CBG 251 - 300: 8 units CBG 301 - 350: 11 units CBG 351 - 400: 15 units CBG > 400: call MD Patient not taking: Reported on 01/28/2016 12/24/15   Shanker Levora Dredge, MD   BP 166/103 mmHg  Pulse 78  Temp(Src) 98.2 F (36.8 C) (Oral)  Resp 19  Ht 5\' 11"  (1.803 m)  Wt 97.523 kg  BMI 30.00 kg/m2  SpO2 95% Physical Exam  Constitutional: He is oriented to person, place, and time. He appears well-developed and well-nourished.  HENT:  Head: Normocephalic and atraumatic.  Eyes: Conjunctivae and EOM are normal. Pupils are equal, round, and reactive to light. Right eye exhibits no discharge. Left eye exhibits no discharge. No scleral icterus.  Neck: Normal range of motion. Neck supple. No JVD present.  Cardiovascular: Normal rate, regular rhythm and normal heart sounds.  Exam reveals no gallop and no friction rub.   No murmur heard. Pulmonary/Chest: Effort normal and breath sounds normal. No respiratory distress. He has no wheezes. He has no rales. He exhibits no tenderness.  Abdominal: Soft. He exhibits no distension and no mass. There is no tenderness. There is no rebound and no guarding.  No focal abdominal tenderness, no RLQ tenderness or pain at  McBurney's point, no RUQ tenderness or Murphy's sign, no left-sided abdominal tenderness, no fluid wave, or signs of peritonitis   Musculoskeletal: Normal range of motion. He exhibits no edema or tenderness.  Neurological: He is alert and oriented to person, place, and time.  Skin: Skin is warm and dry.  Psychiatric: He has a normal mood and affect. His behavior is normal. Judgment and thought content normal.  Nursing note and vitals reviewed.   ED Course  Procedures (including critical care time) Results for orders placed or performed during the hospital encounter of 01/28/16  CBC with Differential/Platelet  Result Value Ref Range   WBC 9.7 4.0 - 10.5 K/uL   RBC 3.20 (L) 4.22 - 5.81 MIL/uL   Hemoglobin 9.2 (L) 13.0 - 17.0 g/dL  HCT 28.7 (L) 39.0 - 52.0 %   MCV 89.7 78.0 - 100.0 fL   MCH 28.8 26.0 - 34.0 pg   MCHC 32.1 30.0 - 36.0 g/dL   RDW 16.1 09.6 - 04.5 %   Platelets 312 150 - 400 K/uL   Neutrophils Relative % 84 %   Neutro Abs 8.1 (H) 1.7 - 7.7 K/uL   Lymphocytes Relative 11 %   Lymphs Abs 1.0 0.7 - 4.0 K/uL   Monocytes Relative 3 %   Monocytes Absolute 0.3 0.1 - 1.0 K/uL   Eosinophils Relative 2 %   Eosinophils Absolute 0.2 0.0 - 0.7 K/uL   Basophils Relative 0 %   Basophils Absolute 0.0 0.0 - 0.1 K/uL  Basic metabolic panel  Result Value Ref Range   Sodium 139 135 - 145 mmol/L   Potassium 4.7 3.5 - 5.1 mmol/L   Chloride 108 101 - 111 mmol/L   CO2 22 22 - 32 mmol/L   Glucose, Bld 80 65 - 99 mg/dL   BUN 52 (H) 6 - 20 mg/dL   Creatinine, Ser 4.09 (H) 0.61 - 1.24 mg/dL   Calcium 8.8 (L) 8.9 - 10.3 mg/dL   GFR calc non Af Amer 36 (L) >60 mL/min   GFR calc Af Amer 42 (L) >60 mL/min   Anion gap 9 5 - 15  Brain natriuretic peptide  Result Value Ref Range   B Natriuretic Peptide 457.9 (H) 0.0 - 100.0 pg/mL  CBG monitoring, ED  Result Value Ref Range   Glucose-Capillary 120 (H) 65 - 99 mg/dL   Comment 1 Notify RN    Comment 2 Document in Chart   CBG monitoring, ED   Result Value Ref Range   Glucose-Capillary 100 (H) 65 - 99 mg/dL  I-Stat CG4 Lactic Acid, ED  Result Value Ref Range   Lactic Acid, Venous 0.54 0.5 - 2.0 mmol/L   Ct Abdomen Pelvis Wo Contrast  01/10/2016  CLINICAL DATA:  Abdominal pain. EXAM: CT ABDOMEN AND PELVIS WITHOUT CONTRAST TECHNIQUE: Multidetector CT imaging of the abdomen and pelvis was performed following the standard protocol without IV contrast. COMPARISON:  CT 09/17/2015 FINDINGS: Lower chest: Small to moderate bilateral pleural effusions, left greater than right. Ground-glass opacities in the included lung bases, left greater than right, suspect pulmonary edema. The heart is enlarged. Liver: No focal lesion allowing for lack contrast. Hepatobiliary: Gallbladder physiologically distended, no calcified stone. No biliary dilatation. Pancreas: Not well-defined.  No evidence pancreatitis. Spleen: Normal in size. Adrenal glands: No nodule. Kidneys: No hydronephrosis.  No localizing renal abnormality. Stomach/Bowel: Stomach is decompressed. There are no dilated or thickened small bowel loops. Small-moderate volume of stool throughout the colon without colonic wall thickening. The appendix is not seen. Vascular/Lymphatic: No retroperitoneal adenopathy. Shotty inguinal and external iliac nodes. Abdominal aorta is normal in caliber, tortuous in its course. Mild atherosclerosis Reproductive: Prostate gland normal in size. Bladder: Decompressed by Foley catheter, however diffuse wall thickening is seen. Other: Diffuse whole body wall edema. Fat in both inguinal canals. No free air, free fluid, or intra-abdominal fluid collection. Musculoskeletal: There are no acute or suspicious osseous abnormalities. Bilateral L5 pars interarticularis defects without listhesis. IMPRESSION: 1. Findings consistent with fluid overload with bilateral pleural effusions and whole body wall edema. 2. Diffuse bladder wall thickening, correlation with urinalysis recommended to  exclude urinary tract infection. Electronically Signed   By: Rubye Oaks M.D.   On: 01/10/2016 02:32   Dg Chest 2 View  01/28/2016  CLINICAL DATA:  Cough and hypoglycemia.  Smoker. EXAM: CHEST  2 VIEW COMPARISON:  01/11/2016 FINDINGS: Lungs are adequately inflated with hazy bilateral perihilar opacification improved compared to the prior exam. No evidence of effusion. Mild stable cardiomegaly. Remainder of the exam is unchanged. IMPRESSION: Hazy bilateral perihilar opacification improved compared to the previous exam. Findings may be due to interstitial edema or infection. Stable cardiomegaly. Electronically Signed   By: Elberta Fortis M.D.   On: 01/28/2016 22:05   Dg Chest Port 1 View  01/11/2016  CLINICAL DATA:  Hypoxia and pneumonia. EXAM: PORTABLE CHEST 1 VIEW COMPARISON:  01/09/2016 FINDINGS: Lungs are hypoinflated and demonstrate continued consolidation over the right upper lobe without significant change with patchy opacification over the right midlung central left lung slightly worse compatible patient's known pneumonia. No evidence of effusion. Mild stable cardiomegaly. Remainder of the exam is unchanged. IMPRESSION: Multifocal airspace process without significant change over the right upper lobe and mild worsening over the right mid to lower lung and central left lung compatible with pneumonia. Electronically Signed   By: Elberta Fortis M.D.   On: 01/11/2016 11:59   Dg Chest Port 1 View  01/09/2016  CLINICAL DATA:  Shortness of breath and headache today. EXAM: PORTABLE CHEST 1 VIEW COMPARISON:  12/22/2015. FINDINGS: The heart is mildly enlarged but stable. The left IJ catheter has been removed. There is a predominantly perihilar pattern of asymmetric airspace disease which could be asymmetric pulmonary edema or possibly bilateral infiltrates. There is also a left pleural effusion. The bony thorax is intact. IMPRESSION: Asymmetric bilateral airspace process, pulmonary edema versus infiltrates.  Small left effusion. Electronically Signed   By: Rudie Meyer M.D.   On: 01/09/2016 16:49    I have personally reviewed and evaluated these images and lab results as part of my medical decision-making.   EKG Interpretation None      MDM   Final diagnoses:  Hypoglycemia    Patient with hypoglycemia.  Noted to be 22 by SNF earlier today.  Given D10 and improved to 129.  Will recheck here.  Remaining labs look improved from past visit.    Patient discussed with Dr. Preston Fleeting, who recommends recheck of CBG to see that it is trending up with eating.  Per psychiatry note 2 weeks ago, the patient DOES NOT meet criteria to make his own medical decisions.  Long history of non-compliance.  Does not understand his medical problems.  11:38 PM CBG is trending up.  Discharge back to SNF.  Roxy Horseman, PA-C 01/28/16 1610  [name], MD 01/29/16 [phone]

## 2016-01-28 NOTE — ED Notes (Signed)
[facility] called for transport.  

## 2016-01-28 NOTE — ED Notes (Signed)
Patient is discharged. Patient is waiting on ptar.

## 2016-01-28 NOTE — ED Notes (Signed)
Patient refused labs. RN made aware.

## 2016-06-09 ENCOUNTER — Inpatient Hospital Stay (HOSPITAL_COMMUNITY)
Admission: EM | Admit: 2016-06-09 | Discharge: 2016-06-12 | DRG: 684 | Disposition: A | Discharge status: Skilled Nursing Facility | Payer: Medicaid Other | Attending: Internal Medicine | Admitting: Internal Medicine

## 2016-06-09 ENCOUNTER — Encounter (HOSPITAL_COMMUNITY): Payer: Self-pay

## 2016-06-09 DIAGNOSIS — I129 Hypertensive chronic kidney disease with stage 1 through stage 4 chronic kidney disease, or unspecified chronic kidney disease: Secondary | ICD-10-CM | POA: Diagnosis present

## 2016-06-09 DIAGNOSIS — E118 Type 2 diabetes mellitus with unspecified complications: Secondary | ICD-10-CM | POA: Diagnosis present

## 2016-06-09 DIAGNOSIS — N179 Acute kidney failure, unspecified: Secondary | ICD-10-CM | POA: Diagnosis not present

## 2016-06-09 DIAGNOSIS — E1165 Type 2 diabetes mellitus with hyperglycemia: Secondary | ICD-10-CM | POA: Diagnosis present

## 2016-06-09 DIAGNOSIS — Z87891 Personal history of nicotine dependence: Secondary | ICD-10-CM

## 2016-06-09 DIAGNOSIS — N183 Chronic kidney disease, stage 3 (moderate): Secondary | ICD-10-CM | POA: Diagnosis not present

## 2016-06-09 DIAGNOSIS — E1122 Type 2 diabetes mellitus with diabetic chronic kidney disease: Secondary | ICD-10-CM | POA: Diagnosis present

## 2016-06-09 DIAGNOSIS — E1121 Type 2 diabetes mellitus with diabetic nephropathy: Secondary | ICD-10-CM | POA: Diagnosis present

## 2016-06-09 DIAGNOSIS — Z833 Family history of diabetes mellitus: Secondary | ICD-10-CM

## 2016-06-09 DIAGNOSIS — R55 Syncope and collapse: Secondary | ICD-10-CM

## 2016-06-09 DIAGNOSIS — N189 Chronic kidney disease, unspecified: Secondary | ICD-10-CM | POA: Diagnosis present

## 2016-06-09 DIAGNOSIS — I10 Essential (primary) hypertension: Secondary | ICD-10-CM | POA: Diagnosis present

## 2016-06-09 DIAGNOSIS — E860 Dehydration: Secondary | ICD-10-CM | POA: Diagnosis present

## 2016-06-09 DIAGNOSIS — Z823 Family history of stroke: Secondary | ICD-10-CM

## 2016-06-09 DIAGNOSIS — F209 Schizophrenia, unspecified: Secondary | ICD-10-CM | POA: Diagnosis present

## 2016-06-09 DIAGNOSIS — K219 Gastro-esophageal reflux disease without esophagitis: Secondary | ICD-10-CM | POA: Diagnosis present

## 2016-06-09 DIAGNOSIS — F39 Unspecified mood [affective] disorder: Secondary | ICD-10-CM | POA: Diagnosis present

## 2016-06-09 DIAGNOSIS — F329 Major depressive disorder, single episode, unspecified: Secondary | ICD-10-CM | POA: Diagnosis present

## 2016-06-09 DIAGNOSIS — E861 Hypovolemia: Secondary | ICD-10-CM | POA: Diagnosis present

## 2016-06-09 LAB — I-STAT CHEM 8, ED
BUN: 61 mg/dL — AB (ref 6–20)
CALCIUM ION: 1.15 mmol/L (ref 1.12–1.23)
CHLORIDE: 99 mmol/L — AB (ref 101–111)
CREATININE: 3.9 mg/dL — AB (ref 0.61–1.24)
GLUCOSE: 317 mg/dL — AB (ref 65–99)
HCT: 29 % — ABNORMAL LOW (ref 39.0–52.0)
Hemoglobin: 9.9 g/dL — ABNORMAL LOW (ref 13.0–17.0)
POTASSIUM: 4.4 mmol/L (ref 3.5–5.1)
Sodium: 135 mmol/L (ref 135–145)
TCO2: 25 mmol/L (ref 0–100)

## 2016-06-09 LAB — CBC WITH DIFFERENTIAL/PLATELET
BASOS ABS: 0 10*3/uL (ref 0.0–0.1)
Basophils Relative: 0 %
EOS ABS: 0.2 10*3/uL (ref 0.0–0.7)
Eosinophils Relative: 3 %
HCT: 26.7 % — ABNORMAL LOW (ref 39.0–52.0)
HEMOGLOBIN: 9.2 g/dL — AB (ref 13.0–17.0)
LYMPHS ABS: 0.7 10*3/uL (ref 0.7–4.0)
LYMPHS PCT: 15 %
MCH: 30.4 pg (ref 26.0–34.0)
MCHC: 34.5 g/dL (ref 30.0–36.0)
MCV: 88.1 fL (ref 78.0–100.0)
Monocytes Absolute: 0.5 10*3/uL (ref 0.1–1.0)
Monocytes Relative: 10 %
NEUTROS PCT: 72 %
Neutro Abs: 3.4 10*3/uL (ref 1.7–7.7)
Platelets: 190 10*3/uL (ref 150–400)
RBC: 3.03 MIL/uL — AB (ref 4.22–5.81)
RDW: 14.2 % (ref 11.5–15.5)
WBC: 4.8 10*3/uL (ref 4.0–10.5)

## 2016-06-09 LAB — COMPREHENSIVE METABOLIC PANEL
ALK PHOS: 76 U/L (ref 38–126)
ALT: 20 U/L (ref 17–63)
AST: 19 U/L (ref 15–41)
Albumin: 4 g/dL (ref 3.5–5.0)
Anion gap: 10 (ref 5–15)
BUN: 68 mg/dL — AB (ref 6–20)
CALCIUM: 9.1 mg/dL (ref 8.9–10.3)
CO2: 25 mmol/L (ref 22–32)
CREATININE: 3.95 mg/dL — AB (ref 0.61–1.24)
Chloride: 101 mmol/L (ref 101–111)
GFR calc non Af Amer: 15 mL/min — ABNORMAL LOW (ref >60)
GFR, EST AFRICAN AMERICAN: 17 mL/min — AB (ref >60)
GLUCOSE: 314 mg/dL — AB (ref 65–99)
Potassium: 4.5 mmol/L (ref 3.5–5.1)
SODIUM: 136 mmol/L (ref 135–145)
Total Bilirubin: 0.5 mg/dL (ref 0.3–1.2)
Total Protein: 7.1 g/dL (ref 6.5–8.1)

## 2016-06-09 LAB — MRSA PCR SCREENING: MRSA by PCR: NEGATIVE

## 2016-06-09 LAB — GLUCOSE, CAPILLARY
GLUCOSE-CAPILLARY: 295 mg/dL — AB (ref 65–99)
GLUCOSE-CAPILLARY: 348 mg/dL — AB (ref 65–99)
Glucose-Capillary: 298 mg/dL — ABNORMAL HIGH (ref 65–99)

## 2016-06-09 LAB — I-STAT TROPONIN, ED: Troponin i, poc: 0.04 ng/mL (ref 0.00–0.08)

## 2016-06-09 LAB — CBG MONITORING, ED: GLUCOSE-CAPILLARY: 312 mg/dL — AB (ref 65–99)

## 2016-06-09 LAB — I-STAT CG4 LACTIC ACID, ED: Lactic Acid, Venous: 0.86 mmol/L (ref 0.5–1.9)

## 2016-06-09 LAB — TROPONIN I
Troponin I: 0.04 ng/mL (ref <0.03)
Troponin I: 0.04 ng/mL (ref <0.03)

## 2016-06-09 LAB — CK: Total CK: 217 U/L (ref 49–397)

## 2016-06-09 MED ORDER — SODIUM CHLORIDE 0.9 % IV BOLUS (SEPSIS)
1000.0000 mL | Freq: Once | INTRAVENOUS | Status: AC
  Administered 2016-06-09: 1000 mL via INTRAVENOUS

## 2016-06-09 MED ORDER — SODIUM CHLORIDE 0.9 % IV SOLN
INTRAVENOUS | Status: DC
  Administered 2016-06-09 – 2016-06-12 (×5): via INTRAVENOUS

## 2016-06-09 MED ORDER — NEPRO/CARBSTEADY PO LIQD
237.0000 mL | Freq: Two times a day (BID) | ORAL | Status: DC
  Administered 2016-06-10 – 2016-06-11 (×3): 237 mL via ORAL
  Filled 2016-06-09 (×6): qty 237

## 2016-06-09 MED ORDER — ACETAMINOPHEN 325 MG PO TABS: 650.0000 mg | ORAL_TABLET | Freq: Four times a day (QID) | ORAL | Status: DC | PRN

## 2016-06-09 MED ORDER — ONDANSETRON HCL 4 MG/2ML IJ SOLN
4.0000 mg | Freq: Four times a day (QID) | INTRAMUSCULAR | Status: DC | PRN
Start: 2016-06-09 — End: 2016-06-12

## 2016-06-09 MED ORDER — DIVALPROEX SODIUM 125 MG PO CSDR
250.0000 mg | DELAYED_RELEASE_CAPSULE | Freq: Two times a day (BID) | ORAL | Status: DC
  Administered 2016-06-09 – 2016-06-12 (×6): 250 mg via ORAL
  Filled 2016-06-09 (×7): qty 2

## 2016-06-09 MED ORDER — DILTIAZEM HCL ER COATED BEADS 240 MG PO CP24
240.0000 mg | ORAL_CAPSULE | Freq: Every day | ORAL | Status: DC
  Administered 2016-06-09 – 2016-06-12 (×4): 240 mg via ORAL
  Filled 2016-06-09 (×4): qty 1

## 2016-06-09 MED ORDER — DOCUSATE SODIUM 100 MG PO CAPS
100.0000 mg | ORAL_CAPSULE | Freq: Two times a day (BID) | ORAL | Status: DC
  Administered 2016-06-09 – 2016-06-12 (×6): 100 mg via ORAL
  Filled 2016-06-09 (×6): qty 1

## 2016-06-09 MED ORDER — PANTOPRAZOLE SODIUM 40 MG PO TBEC
40.0000 mg | DELAYED_RELEASE_TABLET | Freq: Every day | ORAL | Status: DC
  Administered 2016-06-10 – 2016-06-12 (×3): 40 mg via ORAL
  Filled 2016-06-09 (×3): qty 1

## 2016-06-09 MED ORDER — INSULIN DETEMIR 100 UNIT/ML ~~LOC~~ SOLN
20.0000 [IU] | Freq: Every day | SUBCUTANEOUS | Status: DC
  Administered 2016-06-09 – 2016-06-12 (×4): 20 [IU] via SUBCUTANEOUS
  Filled 2016-06-09 (×4): qty 0.2

## 2016-06-09 MED ORDER — ISOSORBIDE MONONITRATE ER 30 MG PO TB24
30.0000 mg | ORAL_TABLET | Freq: Every day | ORAL | Status: DC
  Administered 2016-06-10 – 2016-06-12 (×3): 30 mg via ORAL
  Filled 2016-06-09 (×3): qty 1

## 2016-06-09 MED ORDER — ACETAMINOPHEN 650 MG RE SUPP: 650.0000 mg | Freq: Four times a day (QID) | RECTAL | Status: DC | PRN

## 2016-06-09 MED ORDER — NITROGLYCERIN 0.4 MG SL SUBL: 0.4000 mg | SUBLINGUAL_TABLET | SUBLINGUAL | Status: DC | PRN

## 2016-06-09 MED ORDER — LORAZEPAM 0.5 MG PO TABS
0.5000 mg | ORAL_TABLET | Freq: Two times a day (BID) | ORAL | Status: DC | PRN
  Administered 2016-06-09 – 2016-06-11 (×2): 0.5 mg via ORAL
  Filled 2016-06-09 (×2): qty 1

## 2016-06-09 MED ORDER — ONDANSETRON HCL 4 MG PO TABS: 4.0000 mg | ORAL_TABLET | Freq: Four times a day (QID) | ORAL | Status: DC | PRN

## 2016-06-09 MED ORDER — FERROUS SULFATE 325 (65 FE) MG PO TABS
325.0000 mg | ORAL_TABLET | Freq: Every day | ORAL | Status: DC
  Administered 2016-06-10 – 2016-06-12 (×3): 325 mg via ORAL
  Filled 2016-06-09 (×3): qty 1

## 2016-06-09 MED ORDER — HEPARIN SODIUM (PORCINE) 5000 UNIT/ML IJ SOLN
5000.0000 [IU] | Freq: Three times a day (TID) | INTRAMUSCULAR | Status: DC
  Administered 2016-06-09 – 2016-06-11 (×5): 5000 [IU] via SUBCUTANEOUS
  Filled 2016-06-09 (×5): qty 1

## 2016-06-09 MED ORDER — INSULIN ASPART 100 UNIT/ML ~~LOC~~ SOLN
0.0000 [IU] | Freq: Three times a day (TID) | SUBCUTANEOUS | Status: DC
  Administered 2016-06-09: 5 [IU] via SUBCUTANEOUS
  Administered 2016-06-10 (×2): 2 [IU] via SUBCUTANEOUS
  Administered 2016-06-11 (×2): 5 [IU] via SUBCUTANEOUS
  Administered 2016-06-11: 1 [IU] via SUBCUTANEOUS
  Administered 2016-06-12: 3 [IU] via SUBCUTANEOUS
  Administered 2016-06-12: 1 [IU] via SUBCUTANEOUS

## 2016-06-09 MED ORDER — IPRATROPIUM-ALBUTEROL 0.5-2.5 (3) MG/3ML IN SOLN: 3.0000 mL | RESPIRATORY_TRACT | Status: DC | PRN

## 2016-06-09 NOTE — ED Provider Notes (Signed)
CSN: [id number]     Arrival date & time 06/09/16  1256 History   First MD Initiated Contact with Patient 06/09/16 1305     Chief Complaint  Patient presents with  . Near Syncope     (Consider location/radiation/quality/duration/timing/severity/associated sxs/prior Treatment) Patient is a 65 y.o. male presenting with general illness. The history is provided by the patient.  Illness Severity:  Mild Onset quality:  Gradual Duration:  1 day Timing:  Constant Progression:  Unchanged Chronicity:  New Associated symptoms: no abdominal pain, no chest pain, no congestion, no diarrhea, no fever, no headaches, no myalgias, no rash, no shortness of breath and no vomiting    65 yo M With a chief complaint of near syncope. Patient is a bit slow to respond on my exam but does not feel that he has any significant complaints. States he's been out in the heat today and felt like he was going to pass out. Denies any chest pain shortness breath abdominal pain.  Past Medical History  Diagnosis Date  . CKD (chronic kidney disease), stage III   . Anemia   . HTN (hypertension)   . Type II diabetes mellitus (HCC)   . DVT (deep venous thrombosis) (HCC)     LLE  . Pneumonia 12/06/2015  . GERD (gastroesophageal reflux disease)   . Migraine     "a few times/month" (12/06/2015)  . Depression    Past Surgical History  Procedure Laterality Date  . No past surgeries     Family History  Problem Relation Age of Onset  . Diabetes Mellitus II Mother   . Diabetes Mellitus II Father   . Stroke Maternal Grandfather    Social History  Substance Use Topics  . Smoking status: Current Every Day Smoker -- 0.25 packs/day for 48 years    Types: Cigarettes  . Smokeless tobacco: Never Used  . Alcohol Use: Yes    Review of Systems  Constitutional: Negative for fever and chills.  HENT: Negative for congestion and facial swelling.   Eyes: Negative for discharge and visual disturbance.  Respiratory: Negative for  shortness of breath.   Cardiovascular: Negative for chest pain and palpitations.  Gastrointestinal: Negative for vomiting, abdominal pain and diarrhea.  Musculoskeletal: Negative for myalgias and arthralgias.  Skin: Negative for color change and rash.  Neurological: Positive for syncope (near) and weakness (generalized). Negative for tremors and headaches.  Psychiatric/Behavioral: Negative for confusion and dysphoric mood.      Allergies  Latex  Home Medications   Prior to Admission medications   Medication Sig Start Date End Date Taking? Authorizing Provider  alum & mag hydroxide-simeth (MAALOX PLUS) 400-400-40 MG/5ML suspension Take 10 mLs by mouth every 6 (six) hours as needed for indigestion.   Yes Historical Provider, MD  diltiazem (CARTIA XT) 240 MG 24 hr capsule Take 1 capsule (240 mg total) by mouth daily. 01/15/16  Yes Rolly Salter, MD  divalproex (DEPAKOTE SPRINKLE) 125 MG capsule Take 250 mg by mouth 2 (two) times daily.   Yes Historical Provider, MD  ferrous sulfate 325 (65 FE) MG tablet Take 1 tablet (325 mg total) by mouth daily with breakfast. 12/13/15  Yes Rolly Salter, MD  furosemide (LASIX) 80 MG tablet Take 1 tablet (80 mg total) by mouth 2 (two) times daily. 01/15/16  Yes Rolly Salter, MD  glipiZIDE (GLUCOTROL) 5 MG tablet Take 5 mg by mouth daily before breakfast.   Yes Historical Provider, MD  guaiFENesin (MUCINEX) 600 MG 12 hr  tablet Take 1 tablet (600 mg total) by mouth 2 (two) times daily as needed. Patient taking differently: Take 600 mg by mouth 2 (two) times daily as needed for cough or to loosen phlegm.  12/24/15  Yes Shanker Levora DredgeM Ghimire, MD  haloperidol (HALDOL) 1 MG tablet Take 1 mg by mouth 2 (two) times daily.   Yes Historical Provider, MD  hydrALAZINE (APRESOLINE) 50 MG tablet Take 1 tablet (50 mg total) by mouth 3 (three) times daily. 01/15/16  Yes Rolly SalterPranav M Patel, MD  insulin detemir (LEVEMIR) 100 UNIT/ML injection Inject 0.25 mLs (25 Units total) into the  skin daily. 12/24/15  Yes Shanker Levora DredgeM Ghimire, MD  insulin lispro (HUMALOG) 100 UNIT/ML injection Inject 2-10 Units into the skin 3 (three) times daily with meals. Per sliding scale: 201-250= 2 units; 251-300= 4 units; 301-350= 6 units; 351-400= 8 units; if above 401= 10 units, recheck in 1 hours if still above 401 call MD.   Yes Historical Provider, MD  ipratropium-albuterol (DUONEB) 0.5-2.5 (3) MG/3ML SOLN Take 3 mLs by nebulization every 4 (four) hours as needed. Patient taking differently: Take 3 mLs by nebulization every 4 (four) hours as needed (shortness of breath).  12/24/15  Yes Shanker Levora DredgeM Ghimire, MD  isosorbide mononitrate (IMDUR) 30 MG 24 hr tablet Take 1 tablet (30 mg total) by mouth daily. 01/15/16  Yes Rolly SalterPranav M Patel, MD  LORazepam (ATIVAN) 0.5 MG tablet Take 0.5 mg by mouth 2 (two) times daily as needed for anxiety (breakthrough anxiety).   Yes Historical Provider, MD  omeprazole (PRILOSEC) 20 MG capsule Take 20 mg by mouth daily.   Yes Historical Provider, MD  potassium chloride (K-DUR) 10 MEQ tablet Take 2 tablets (20 mEq total) by mouth daily. 12/24/15  Yes Shanker Levora DredgeM Ghimire, MD  ranitidine (ZANTAC) 150 MG tablet Take 150 mg by mouth 2 (two) times daily.   Yes Historical Provider, MD  senna (SENOKOT) 8.6 MG tablet Take 2 tablets by mouth at bedtime.   Yes Historical Provider, MD  spironolactone (ALDACTONE) 50 MG tablet Take 50 mg by mouth daily.   Yes Historical Provider, MD  glyBURIDE (DIABETA) 5 MG tablet Take 2 tablets (10 mg total) by mouth 2 (two) times daily with a meal. Patient not taking: Reported on 06/09/2016 12/05/15   Alison MurrayAlma M Devine, MD  insulin aspart (NOVOLOG) 100 UNIT/ML injection 0-15 Units, Subcutaneous, 3 times daily with meals CBG < 70: implement hypoglycemia protocol CBG 70 - 120: 0 units CBG 121 - 150: 2 units CBG 151 - 200: 3 units CBG 201 - 250: 5 units CBG 251 - 300: 8 units CBG 301 - 350: 11 units CBG 351 - 400: 15 units CBG > 400: call MD Patient not taking:  Reported on 01/28/2016 12/24/15   Shanker Levora DredgeM Ghimire, MD   BP 149/86 mmHg  Pulse 91  Temp(Src) 98.2 F (36.8 C) (Oral)  Resp 16  SpO2 98% Physical Exam  Constitutional: He is oriented to person, place, and time. He appears well-developed and well-nourished.  HENT:  Head: Normocephalic and atraumatic.  Eyes: EOM are normal. Pupils are equal, round, and reactive to light.  Neck: Normal range of motion. Neck supple. No JVD present.  Cardiovascular: Normal rate and regular rhythm.  Exam reveals no gallop and no friction rub.   No murmur heard. Pulmonary/Chest: No respiratory distress. He has no wheezes.  Abdominal: He exhibits no distension. There is no tenderness. There is no rebound and no guarding.  Musculoskeletal: Normal range of motion.  Neurological: He is alert and oriented to person, place, and time. GCS eye subscore is 4. GCS verbal subscore is 5. GCS motor subscore is 6.  Slow to respond   Skin: No rash noted. No pallor.  Psychiatric: He has a normal mood and affect. His behavior is normal.  Nursing note and vitals reviewed.   ED Course  Procedures (including critical care time) Labs Review Labs Reviewed  CBC WITH DIFFERENTIAL/PLATELET - Abnormal; Notable for the following:    RBC 3.03 (*)    Hemoglobin 9.2 (*)    HCT 26.7 (*)    All other components within normal limits  COMPREHENSIVE METABOLIC PANEL - Abnormal; Notable for the following:    Glucose, Bld 314 (*)    BUN 68 (*)    Creatinine, Ser 3.95 (*)    GFR calc non Af Amer 15 (*)    GFR calc Af Amer 17 (*)    All other components within normal limits  CBG MONITORING, ED - Abnormal; Notable for the following:    Glucose-Capillary 312 (*)    All other components within normal limits  I-STAT CHEM 8, ED - Abnormal; Notable for the following:    Chloride 99 (*)    BUN 61 (*)    Creatinine, Ser 3.90 (*)    Glucose, Bld 317 (*)    Hemoglobin 9.9 (*)    HCT 29.0 (*)    All other components within normal limits   CK  URINALYSIS, ROUTINE W REFLEX MICROSCOPIC (NOT AT [facility])  I-STAT TROPOININ, ED  I-STAT CG4 LACTIC ACID, ED    Imaging Review No results found. I have personally reviewed and evaluated these images and lab results as part of my medical decision-making.   EKG Interpretation   Date/Time:  Sunday June 09 2016 13:20:20 EDT Ventricular Rate:  91 PR Interval:    QRS Duration: 144 QT Interval:  373 QTC Calculation: 459 R Axis:   75 Text Interpretation:  Sinus rhythm Atrial premature complex Probable left  atrial enlargement Right bundle branch block Baseline wander in lead(s) V1  V2 No significant change since last tracing Confirmed by Mikaiah Stoffer MD, Reuel Boom  ([id number]) on 06/09/2016 1:25:55 PM      Procedure note: Ultrasound Guided Peripheral IV Ultrasound guided peripheral 1.88 inch angiocath IV placement performed by me. Indications: Nursing unable to place IV. Details: The antecubital fossa and upper arm were evaluated with a multifrequency linear probe. Patent brachial veins were noted. 1 attempt was made to cannulate a vein under realtime US guidance with successful cannulation of the vein and catheter placement. There is return of non-pulsatile dark red blood. The patient tolerated the procedure well without complications. Images archived electronically.  CPT codes: 19147 and [phone]  MDM   Final diagnoses:  Acute kidney injury [facility])    65 yo M with a chief complaint of near syncope. I will check labs, ekg, cbg, give a fluid bolus.  EMS concern for heat exposure, not tachycardic, no temp.  Will give fluids, check ck.   Patient found to be in ARF, will start iv fluids, admit.   The patients results and plan were reviewed and discussed.   Any x-rays performed were independently reviewed by myself.   Differential diagnosis were considered with the presenting HPI.  Medications  sodium chloride 0.9 % bolus 1,000 mL (not administered)    Filed Vitals:   06/09/16 1304 06/09/16 1309   BP:  149/86  Pulse:  91  Temp:  98.2 F (36.8  C)  TempSrc:  Oral  Resp:  16  SpO2: 100% 98%    Final diagnoses:  Acute kidney injury [facility](HCC)    Admission/ observation were discussed with the admitting physician, patient and/or family and they are comfortable with the plan.      Melene Planan Marsha Gundlach, DO 06/09/16 [phone]

## 2016-06-09 NOTE — ED Notes (Signed)
Pt refuses rectal temp

## 2016-06-09 NOTE — ED Notes (Signed)
Attempted to give medications- not in ED.

## 2016-06-09 NOTE — ED Notes (Addendum)
Info PT more labs have been order. Refuses labs. Rn have been made aware

## 2016-06-09 NOTE — ED Notes (Signed)
Bed: WA07 Expected date:  Expected time:  Means of arrival:  Comments: 65 yo heat exhaustion

## 2016-06-09 NOTE — ED Notes (Signed)
MD at bedside. ADMISSION MD PRESENT 

## 2016-06-09 NOTE — ED Notes (Signed)
MD at bedside. EDP ATTEMPTED US IV

## 2016-06-09 NOTE — Progress Notes (Signed)
Patient transferred from the 3rd floor, mad and asking for his food, will not respond to questions most of the time, alert, scabs/scars from falls per patient, bil feet dry/scally. Tele applied and confirmed. Reported off to next shift to F/U with plan of care.

## 2016-06-09 NOTE — ED Notes (Signed)
PT would not keep his arm put while needle in arm.This Clinical research associatewriter took out Charity fundraiserneedle info RN unable to collect labs

## 2016-06-09 NOTE — ED Notes (Signed)
CINDY RN AWARE OF PENDING MEDICATIONS

## 2016-06-09 NOTE — H&P (Addendum)
History and Physical    Clayton [name] RUE:[id number] DOB: [birth year] DOA: 06/09/2016  PCP: PROVIDER NOT IN SYSTEM  Patient coming from: unclear from where /   Chief Complaint: Lightheaded.   HPI: Clayton [name] is a 65 y.o. male with medical history significant of CKD stage III cr per records 2.0 to 2.4 ,DM, he was deem with no capacity last admision. He presents to ED with complain of lightheaded and chest pain.  He relates that he lives at a facility. He went to church today. On his way to get food he started to feel lightheaded. He relates it was really hot outside. He was found by a pedestrian inclined to the wall of a building per nurse ED report. EMS was called at that time. Patient relates chest pain at that time, sharp in quality, middle of the chest.   Patient slowly answering questions,unclear if this is his baseline. He denies chest pain, dyspnea at this time.    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   Review of Systems: As per HPI otherwise 10 point review of systems negative.    Past Medical History  Diagnosis Date  . CKD (chronic kidney disease), stage III   . Anemia   . HTN (hypertension)   . Type II diabetes mellitus (HCC)   . DVT (deep venous thrombosis) (HCC)     LLE  . Pneumonia 12/06/2015  . GERD (gastroesophageal reflux disease)   . Migraine     "a few times/month" (12/06/2015)  . Depression     Past Surgical History  Procedure Laterality Date  . No past surgeries       reports that he has been smoking Cigarettes.  He has a 12 pack-year smoking history. He has never used smokeless tobacco. He reports that he drinks alcohol. He reports that he uses illicit drugs ("Crack" cocaine).  Allergies  Allergen Reactions  . Latex Rash    Family History  Problem Relation Age of Onset  . Diabetes Mellitus II Mother   . Diabetes Mellitus II Father   . Stroke Maternal Grandfather     Prior to Admission medications     Medication Sig Start Date End Date Taking? Authorizing Provider  alum & mag hydroxide-simeth (MAALOX PLUS) 400-400-40 MG/5ML suspension Take 10 mLs by mouth every 6 (six) hours as needed for indigestion.   Yes Historical Provider, MD  diltiazem (CARTIA XT) 240 MG 24 hr capsule Take 1 capsule (240 mg total) by mouth daily. 01/15/16  Yes Rolly Salter, MD  divalproex (DEPAKOTE SPRINKLE) 125 MG capsule Take 250 mg by mouth 2 (two) times daily.   Yes Historical Provider, MD  ferrous sulfate 325 (65 FE) MG tablet Take 1 tablet (325 mg total) by mouth daily with breakfast. 12/13/15  Yes Rolly Salter, MD  furosemide (LASIX) 80 MG tablet Take 1 tablet (80 mg total) by mouth 2 (two) times daily. 01/15/16  Yes Rolly Salter, MD  glipiZIDE (GLUCOTROL) 5 MG tablet Take 5 mg by mouth daily before breakfast.   Yes Historical Provider, MD  guaiFENesin (MUCINEX) 600 MG 12 hr tablet Take 1 tablet (600 mg total) by mouth 2 (two) times daily as needed. Patient taking differently: Take 600 mg by mouth 2 (two) times daily as needed for cough or to loosen phlegm.  12/24/15  Yes Shanker Levora Dredge, MD  haloperidol (HALDOL) 1 MG tablet Take 1 mg by mouth 2 (two) times daily.  Yes Historical Provider, MD  hydrALAZINE (APRESOLINE) 50 MG tablet Take 1 tablet (50 mg total) by mouth 3 (three) times daily. 01/15/16  Yes Rolly Salter, MD  insulin detemir (LEVEMIR) 100 UNIT/ML injection Inject 0.25 mLs (25 Units total) into the skin daily. 12/24/15  Yes Shanker Levora Dredge, MD  insulin lispro (HUMALOG) 100 UNIT/ML injection Inject 2-10 Units into the skin 3 (three) times daily with meals. Per sliding scale: 201-250= 2 units; 251-300= 4 units; 301-350= 6 units; 351-400= 8 units; if above 401= 10 units, recheck in 1 hours if still above 401 call MD.   Yes Historical Provider, MD  ipratropium-albuterol (DUONEB) 0.5-2.5 (3) MG/3ML SOLN Take 3 mLs by nebulization every 4 (four) hours as needed. Patient taking differently: Take 3 mLs by  nebulization every 4 (four) hours as needed (shortness of breath).  12/24/15  Yes Shanker Levora Dredge, MD  isosorbide mononitrate (IMDUR) 30 MG 24 hr tablet Take 1 tablet (30 mg total) by mouth daily. 01/15/16  Yes Rolly Salter, MD  LORazepam (ATIVAN) 0.5 MG tablet Take 0.5 mg by mouth 2 (two) times daily as needed for anxiety (breakthrough anxiety).   Yes Historical Provider, MD  omeprazole (PRILOSEC) 20 MG capsule Take 20 mg by mouth daily.   Yes Historical Provider, MD  potassium chloride (K-DUR) 10 MEQ tablet Take 2 tablets (20 mEq total) by mouth daily. 12/24/15  Yes Shanker Levora Dredge, MD  ranitidine (ZANTAC) 150 MG tablet Take 150 mg by mouth 2 (two) times daily.   Yes Historical Provider, MD  senna (SENOKOT) 8.6 MG tablet Take 2 tablets by mouth at bedtime.   Yes Historical Provider, MD  spironolactone (ALDACTONE) 50 MG tablet Take 50 mg by mouth daily.   Yes Historical Provider, MD  glyBURIDE (DIABETA) 5 MG tablet Take 2 tablets (10 mg total) by mouth 2 (two) times daily with a meal. Patient not taking: Reported on 06/09/2016 12/05/15   Alison Murray, MD  insulin aspart (NOVOLOG) 100 UNIT/ML injection 0-15 Units, Subcutaneous, 3 times daily with meals CBG < 70: implement hypoglycemia protocol CBG 70 - 120: 0 units CBG 121 - 150: 2 units CBG 151 - 200: 3 units CBG 201 - 250: 5 units CBG 251 - 300: 8 units CBG 301 - 350: 11 units CBG 351 - 400: 15 units CBG > 400: call MD Patient not taking: Reported on 01/28/2016 12/24/15   Shanker Levora Dredge, MD    Physical Exam: Filed Vitals:   06/09/16 1304 06/09/16 1309  BP:  149/86  Pulse:  91  Temp:  98.2 F (36.8 C)  TempSrc:  Oral  Resp:  16  SpO2: 100% 98%      Constitutional: NAD, calm, comfortable,slwolyanswering questions.  Filed Vitals:   06/09/16 1304 06/09/16 1309  BP:  149/86  Pulse:  91  Temp:  98.2 F (36.8 C)  TempSrc:  Oral  Resp:  16  SpO2: 100% 98%   Eyes: PERRL, lids and conjunctivae normal ENMT: Mucous membranes  are moist. Posterior pharynx clear of any exudate or lesions.Normal dentition.  Neck: normal, supple, no masses, no thyromegaly Respiratory: clear to auscultation bilaterally, no wheezing, no crackles. Normal respiratory effort. No accessory muscle use.  Cardiovascular: Regular rate and rhythm, no murmurs / rubs / gallops. No extremity edema. 2+ pedal pulses. No carotid bruits.  Abdomen: no tenderness, no masses palpated. No hepatosplenomegaly. Bowel sounds positive.  Musculoskeletal: no clubbing / cyanosis. No joint deformity upper and lower extremities. Good ROM, no  contractures. Normal muscle tone.  Skin: no rashes, lesions, ulcers. No induration Neurologic: CN 2-12 grossly intact. Sensation intact, DTR normal. Strength 5/5 in all 4.  Psychiatric: Normal judgment and insight. Alert and oriented x 3. Normal mood.    Labs on Admission: I have personally reviewed following labs and imaging studies  CBC:  Recent Labs Lab 06/09/16 1405 06/09/16 1411  WBC 4.8  --   NEUTROABS 3.4  --   HGB 9.2* 9.9*  HCT 26.7* 29.0*  MCV 88.1  --   PLT 190  --    Basic Metabolic Panel:  Recent Labs Lab 06/09/16 1405 06/09/16 1411  NA 136 135  K 4.5 4.4  CL 101 99*  CO2 25  --   GLUCOSE 314* 317*  BUN 68* 61*  CREATININE 3.95* 3.90*  CALCIUM 9.1  --    GFR: CrCl cannot be calculated (Unknown ideal weight.). Liver Function Tests:  Recent Labs Lab 06/09/16 1405  AST 19  ALT 20  ALKPHOS 76  BILITOT 0.5  PROT 7.1  ALBUMIN 4.0   No results for input(s): LIPASE, AMYLASE in the last 168 hours. No results for input(s): AMMONIA in the last 168 hours. Coagulation Profile: No results for input(s): INR, PROTIME in the last 168 hours. Cardiac Enzymes:  Recent Labs Lab 06/09/16 1405  CKTOTAL 217   BNP (last 3 results) No results for input(s): PROBNP in the last 8760 hours. HbA1C: No results for input(s): HGBA1C in the last 72 hours. CBG:  Recent Labs Lab 06/09/16 1327  GLUCAP  312*   Lipid Profile: No results for input(s): CHOL, HDL, LDLCALC, TRIG, CHOLHDL, LDLDIRECT in the last 72 hours. Thyroid Function Tests: No results for input(s): TSH, T4TOTAL, FREET4, T3FREE, THYROIDAB in the last 72 hours. Anemia Panel: No results for input(s): VITAMINB12, FOLATE, FERRITIN, TIBC, IRON, RETICCTPCT in the last 72 hours. Urine analysis:    Component Value Date/Time   COLORURINE YELLOW 01/09/2016 1800   APPEARANCEUR HAZY* 01/09/2016 1800   LABSPEC 1.016 01/09/2016 1800   PHURINE 5.0 01/09/2016 1800   GLUCOSEU NEGATIVE 01/09/2016 1800   HGBUR NEGATIVE 01/09/2016 1800   BILIRUBINUR NEGATIVE 01/09/2016 1800   KETONESUR NEGATIVE 01/09/2016 1800   PROTEINUR >300* 01/09/2016 1800   UROBILINOGEN 0.2 10/08/2015 0634   NITRITE NEGATIVE 01/09/2016 1800   LEUKOCYTESUR NEGATIVE 01/09/2016 1800   Sepsis Labs: !!!!!!!!!!!!!!!!!!!!!!!!!!!!!!!!!!!!!!!!!!!! @LABRCNTIP (procalcitonin:4,lacticidven:4) )No results found for this or any previous visit (from the past 240 hour(s)).   Radiological Exams on Admission: No results found.  EKG: Independently reviewed. Sinus , Old  RBBB  Assessment/Plan Active Problems:   Acute renal failure superimposed on stage 3 chronic kidney disease (HCC)   Essential hypertension   Diabetes mellitus with complication (HCC)   AKI (acute kidney injury) (HCC)   Near syncope  1-Near syncope; Could be related to hypovolemia.  Admit to telemetry  Orthostatic vitals.  IV fluids.  Cycle cardiac enzymes.   2-Chest pain;  First troponin negative.  Continue to cycle enzymes.  He is chest pain free.  Continue with Imdur.  Nitroglycerin PRN for chest pain.  3-Acute on chronic Renal failure stage III Last cr per records: 1.9---2.4 Cr on admission at 3.9.  Hold diuretics.  IV fluids.  Check Uranalysis.  Repeat labs in am.   4-DM, hyperglycemia;  Continue with levemir ,lower home dose.  SSI>   5-Anemia; hb stable.    Others  medications: Unclear if patient has been on haldo which is on med list. Will hold for now.  Will continue with dialtin for now.    DVT prophylaxis: heparin.  Code Status: presume full code.  Family Communication: unable to reach Emergency contacts.  Disposition Plan: will probably need placement. Unclear where patient resides/  Consults called: none Admission status: Observation,telemetry    Hartley Barefootegalado, Kiylah Loyer A MD [facility] Pager [phone]- [phone]  If 7PM-7AM, please contact night-coverage [email or web address] Password [email or web address]  06/09/2016, 3:35 PM

## 2016-06-09 NOTE — ED Notes (Signed)
Per [facility]- By- stander witnessed "looking like he was going to pass out" Pt did not have seizure, NEG STROKE. Denies NVD and fever. Pt states has not taken insulin for several days. On scene Pt alert and active with care and continues at present.

## 2016-06-10 DIAGNOSIS — E118 Type 2 diabetes mellitus with unspecified complications: Secondary | ICD-10-CM | POA: Diagnosis not present

## 2016-06-10 DIAGNOSIS — I129 Hypertensive chronic kidney disease with stage 1 through stage 4 chronic kidney disease, or unspecified chronic kidney disease: Secondary | ICD-10-CM | POA: Diagnosis present

## 2016-06-10 DIAGNOSIS — R55 Syncope and collapse: Secondary | ICD-10-CM | POA: Diagnosis not present

## 2016-06-10 DIAGNOSIS — E1121 Type 2 diabetes mellitus with diabetic nephropathy: Secondary | ICD-10-CM | POA: Diagnosis present

## 2016-06-10 DIAGNOSIS — K219 Gastro-esophageal reflux disease without esophagitis: Secondary | ICD-10-CM

## 2016-06-10 DIAGNOSIS — Z87891 Personal history of nicotine dependence: Secondary | ICD-10-CM | POA: Diagnosis not present

## 2016-06-10 DIAGNOSIS — Z823 Family history of stroke: Secondary | ICD-10-CM | POA: Diagnosis not present

## 2016-06-10 DIAGNOSIS — E861 Hypovolemia: Secondary | ICD-10-CM | POA: Diagnosis present

## 2016-06-10 DIAGNOSIS — F209 Schizophrenia, unspecified: Secondary | ICD-10-CM | POA: Diagnosis present

## 2016-06-10 DIAGNOSIS — I10 Essential (primary) hypertension: Secondary | ICD-10-CM | POA: Diagnosis not present

## 2016-06-10 DIAGNOSIS — F39 Unspecified mood [affective] disorder: Secondary | ICD-10-CM | POA: Diagnosis present

## 2016-06-10 DIAGNOSIS — F329 Major depressive disorder, single episode, unspecified: Secondary | ICD-10-CM

## 2016-06-10 DIAGNOSIS — N183 Chronic kidney disease, stage 3 (moderate): Secondary | ICD-10-CM | POA: Diagnosis present

## 2016-06-10 DIAGNOSIS — N179 Acute kidney failure, unspecified: Secondary | ICD-10-CM | POA: Diagnosis present

## 2016-06-10 DIAGNOSIS — Z833 Family history of diabetes mellitus: Secondary | ICD-10-CM | POA: Diagnosis not present

## 2016-06-10 DIAGNOSIS — E1165 Type 2 diabetes mellitus with hyperglycemia: Secondary | ICD-10-CM | POA: Diagnosis present

## 2016-06-10 DIAGNOSIS — E1122 Type 2 diabetes mellitus with diabetic chronic kidney disease: Secondary | ICD-10-CM | POA: Diagnosis present

## 2016-06-10 DIAGNOSIS — E860 Dehydration: Secondary | ICD-10-CM | POA: Diagnosis present

## 2016-06-10 LAB — URINALYSIS, ROUTINE W REFLEX MICROSCOPIC
Bilirubin Urine: NEGATIVE
Glucose, UA: 250 mg/dL — AB
HGB URINE DIPSTICK: NEGATIVE
Ketones, ur: NEGATIVE mg/dL
LEUKOCYTES UA: NEGATIVE
NITRITE: NEGATIVE
PROTEIN: 100 mg/dL — AB
Specific Gravity, Urine: 1.016 (ref 1.005–1.030)
pH: 5 (ref 5.0–8.0)

## 2016-06-10 LAB — BASIC METABOLIC PANEL
ANION GAP: 9 (ref 5–15)
BUN: 70 mg/dL — ABNORMAL HIGH (ref 6–20)
CALCIUM: 8.9 mg/dL (ref 8.9–10.3)
CO2: 25 mmol/L (ref 22–32)
CREATININE: 2.89 mg/dL — AB (ref 0.61–1.24)
Chloride: 103 mmol/L (ref 101–111)
GFR, EST AFRICAN AMERICAN: 25 mL/min — AB (ref >60)
GFR, EST NON AFRICAN AMERICAN: 22 mL/min — AB (ref >60)
Glucose, Bld: 231 mg/dL — ABNORMAL HIGH (ref 65–99)
Potassium: 4.5 mmol/L (ref 3.5–5.1)
SODIUM: 137 mmol/L (ref 135–145)

## 2016-06-10 LAB — GLUCOSE, CAPILLARY
GLUCOSE-CAPILLARY: 181 mg/dL — AB (ref 65–99)
GLUCOSE-CAPILLARY: 197 mg/dL — AB (ref 65–99)
GLUCOSE-CAPILLARY: 119 mg/dL — AB (ref 65–99)
Glucose-Capillary: 75 mg/dL (ref 65–99)

## 2016-06-10 LAB — URINE MICROSCOPIC-ADD ON

## 2016-06-10 NOTE — Plan of Care (Signed)
Problem: Pain Managment: Goal: General experience of comfort will improve Outcome: Progressing Pt denies any pain or discomfort, no non verbal cues of pain are noted.

## 2016-06-10 NOTE — Progress Notes (Signed)
Patient became extremely agitated and combative when attempting to give bath. Patient incontinent of urine. RN and tech informed patient that he can not sit in urine due to infection. Patient allowed bath, but remained agitated. Will continue to monitor closely

## 2016-06-10 NOTE — Progress Notes (Signed)
Patient much more lethargic this afternoon compared to am, responding to voice.  VSS.  May be depakote?, no PRN medications given.  Refusing neuro assessment.  MD made aware.  Will continue to monitor closely.

## 2016-06-10 NOTE — [place] FL2 (Signed)
[place] MEDICAID FL2 LEVEL OF CARE SCREENING TOOL     IDENTIFICATION  Patient Name: Clayton [name] Birthdate: 19-Aug-1951 Sex: male Admission Date (Current Location): 06/09/2016  [place]  [place] [id number] Facility and Address:  [facility],  [address], [place] [id number]      Provider Number: [id number]  Attending Physician Name and Address:  Vassie Lollarlos Madera, MD  Relative Name and Phone Number:  Pearson Forsterelson Johnson, Pastor [phone]    Current Level of Care: Hospital Recommended Level of Care: Skilled Nursing Facility Prior Approval Number:    Date Approved/Denied:   PASRR Number: [id number]  Discharge Plan: SNF    Current Diagnoses: Patient Active Problem List   Diagnosis Date Noted  . AKI (acute kidney injury) (HCC) 06/09/2016  . Near syncope 06/09/2016  . Acute pulmonary edema (HCC) 01/09/2016  . Type 2 diabetes mellitus with hyperosmolar nonketotic hyperglycemia (HCC) 12/16/2015  . Acute respiratory failure with hypoxia (HCC) 12/16/2015  . Prolonged Q-T interval on ECG 12/16/2015  . CKD (chronic kidney disease) 12/11/2015  . Diabetes mellitus with complication (HCC) 12/11/2015  . Homeless 12/11/2015  . Anemia of chronic disease 12/11/2015  . Abnormal chest x-ray 12/06/2015  . Hyperglycemia 12/04/2015  . Hypertensive urgency 12/04/2015  . Acute on chronic diastolic CHF (congestive heart failure) (HCC) 12/04/2015  . HCAP (healthcare-associated pneumonia)   . Cocaine abuse 10/08/2015  . Acute renal failure superimposed on stage 3 chronic kidney disease (HCC) 08/17/2015  . Essential hypertension   . Noncompliance with medications 12/13/2011  . Alcohol abuse 12/13/2011  . Hyponatremia 12/01/2011    Orientation RESPIRATION BLADDER Height & Weight     Self, Time, Situation, Place  Normal Incontinent Weight: 193 lb 3.2 oz (87.635 kg) Height:  6' (182.9 cm)  BEHAVIORAL SYMPTOMS/MOOD NEUROLOGICAL BOWEL NUTRITION STATUS   Continent Diet (Renal/Carb Modified)  AMBULATORY STATUS COMMUNICATION OF NEEDS Skin   Extensive Assist Verbally Normal                       Personal Care Assistance Level of Assistance  Bathing, Feeding, Dressing Bathing Assistance: Limited assistance Feeding assistance: Limited assistance Dressing Assistance: Limited assistance     Functional Limitations Info             SPECIAL CARE FACTORS FREQUENCY  PT (By licensed PT)     PT Frequency: 5              Contractures      Additional Factors Info  Code Status, Allergies Code Status Info: Fullcode Allergies Info: Latex           Current Medications (06/10/2016):  This is the current hospital active medication list Current Facility-Administered Medications  Medication Dose Route Frequency Provider Last Rate Last Dose  . 0.9 %  sodium chloride infusion   Intravenous Continuous Vassie Lollarlos Madera, MD 100 mL/hr at 06/10/16 0233    . acetaminophen (TYLENOL) tablet 650 mg  650 mg Oral Q6H PRN Belkys A Regalado, MD       Or  . acetaminophen (TYLENOL) suppository 650 mg  650 mg Rectal Q6H PRN Belkys A Regalado, MD      . diltiazem (CARDIZEM CD) 24 hr capsule 240 mg  240 mg Oral Daily Belkys A Regalado, MD   240 mg at 06/10/16 0921  . divalproex (DEPAKOTE SPRINKLE) capsule 250 mg  250 mg Oral BID Belkys A Regalado, MD   250 mg at 06/10/16 0921  . docusate sodium (COLACE)  capsule 100 mg  100 mg Oral BID Belkys A Regalado, MD   100 mg at 06/10/16 0921  . feeding supplement (NEPRO CARB STEADY) liquid 237 mL  237 mL Oral BID BM Belkys A Regalado, MD      . ferrous sulfate tablet 325 mg  325 mg Oral Q breakfast Belkys A Regalado, MD   325 mg at 06/10/16 0921  . heparin injection 5,000 Units  5,000 Units Subcutaneous Q8H Belkys A Regalado, MD   5,000 Units at 06/09/16 2110  . insulin aspart (novoLOG) injection 0-9 Units  0-9 Units Subcutaneous TID WC Alba CoryBelkys A Regalado, MD   2 Units at 06/10/16 [id number]  . insulin detemir (LEVEMIR)  injection 20 Units  20 Units Subcutaneous Daily Belkys A Regalado, MD   20 Units at 06/10/16 0922  . ipratropium-albuterol (DUONEB) 0.5-2.5 (3) MG/3ML nebulizer solution 3 mL  3 mL Nebulization Q4H PRN Belkys A Regalado, MD      . isosorbide mononitrate (IMDUR) 24 hr tablet 30 mg  30 mg Oral Daily Belkys A Regalado, MD   30 mg at 06/10/16 0921  . LORazepam (ATIVAN) tablet 0.5 mg  0.5 mg Oral BID PRN Belkys A Regalado, MD   0.5 mg at 06/09/16 2330  . nitroGLYCERIN (NITROSTAT) SL tablet 0.4 mg  0.4 mg Sublingual Q5 min PRN Belkys A Regalado, MD      . ondansetron (ZOFRAN) tablet 4 mg  4 mg Oral Q6H PRN Belkys A Regalado, MD       Or  . ondansetron (ZOFRAN) injection 4 mg  4 mg Intravenous Q6H PRN Belkys A Regalado, MD      . pantoprazole (PROTONIX) EC tablet 40 mg  40 mg Oral Daily Belkys A Regalado, MD   40 mg at 06/10/16 [id number]     Discharge Medications: Please see discharge summary for a list of discharge medications.  Relevant Imaging Results:  Relevant Lab Results:   Additional Information SSN [id number]  Arlyss RepressHarrison, Mayra Brahm F, LCSW

## 2016-06-10 NOTE — Progress Notes (Signed)
Patient became agitated and combative when offering urinal to patient.  Will offer urinal at another time.

## 2016-06-10 NOTE — Progress Notes (Signed)
[facility] PROGRESS NOTE  Clayton [name] WUJ:[id number] DOB: 1951/01/18 DOA: 06/09/2016 PCP: PROVIDER NOT IN SYSTEM  Interim summary and history of present illness 65 y.o. male with medical history significant of CKD stage III cr per records 2.0 to 2.4 ,DM, he was deem with no capacity last admision. He presents to ED with complain of lightheaded and chest pain.  He relates that he lives at a facility. He went to church today. On his way to get food he started to feel lightheaded. He relates it was really hot outside. He was found by a pedestrian inclined to the wall of a building per nurse ED report. EMS was called at that time. Patient relates chest pain at that time, sharp in quality, middle of the chest.   Slowly responding, renal function improvement with fluid resuscitation and there is no signs of active infection. Will require nursing home for further care and rehabilitation at discharge.  Assessment/Plan: 1-Near syncope; -Could be related to hypovolemia/dehydration and heat stroke (patient reported to be exposed to hot weather for prolonged period of time).  -Will continue telemetry evaluation for the next 24 hours.  -Repeat orthostatic vitals.  -Continue IV fluids resuscitation  -Follow clinical response.  2-Chest pain;  -No chest pain, no shortness of breath -Troponins with Betty mild elevation (most likely secondary to abnormal kidney function) -Will continue Imdur.  -Nitroglycerin PRN for chest pain.  3-Acute on chronic Renal failure stage III -Baseline 1.9---2.4 -Cr on admission at 3.9.  -UA without signs of infection -Will continue holding nephrotoxic agents -Will continue IV fluids resuscitation -Creatinine properly trending down (commonly 2.89).   4-DM2 with nephropathy and hyperglycemia;  -Will continue sliding scale insulin -Continue Levemir -Follow CBGs and adjust hypoglycemic regimen as needed -Modified carbohydrates diet has been  ordered  5-Anemia;  -Appears to be anemia of chronic disease. -Hemoglobin has remained stable -No signs of overt bleeding -Will follow hemoglobin trend  6-GERD -Will continue PPI  7-mood disorder and schizophrenia -Appears to be a stable -No hallucinations or suicidal ideation -Will continue Depakote  8-essential hypertension -Will continue the use of Inderal and Cardizem -His other antihypertensive agent has been placed on hold secondary to acute on chronic renal failure.  Code Status: Full code Family Communication: No family at bedside Disposition Plan: Anticipate discharge to nursing home once medically stable. Still with elevation of his creatinine and with high BUN.  Consultants:  None  Procedures:  See below for x-ray reports  Antibiotics:  None  HPI/Subjective: Patient is slightly somnolent but oriented 2 (unknown baseline); denies chest pain, shortness of breath, nausea, vomiting, abdominal pain and is afebrile.  Objective: Filed Vitals:   06/10/16 0455 06/10/16 1316  BP: 151/83 136/56  Pulse: 80 79  Temp: 99.3 F (37.4 C) 98.3 F (36.8 C)  Resp: 20 20    Intake/Output Summary (Last 24 hours) at 06/10/16 1701 Last data filed at 06/10/16 1229  Gross per 24 hour  Intake 1718.33 ml  Output    800 ml  Net 918.33 ml   Filed Weights   06/09/16 2035 06/10/16 0455  Weight: 86.274 kg (190 lb 3.2 oz) 87.635 kg (193 lb 3.2 oz)    Exam:   General:  Alert, awake and oriented 2; able to follow commands. No fever, denies chest pain and shortness of breath. Patient reports no nausea, no vomiting and no abdominal pain.  Cardiovascular: S1 and S2, no rubs, no gallops, no JVD on exam  Respiratory: Clear to auscultation bilaterally,  good oxygen saturation on room air and no use of accessory muscles.  Abdomen: Soft, nondistended, no guarding, positive bowel sounds  Musculoskeletal: No edema, no cyanosis or clubbing  Data Reviewed: Basic Metabolic  Panel:  Recent Labs Lab 06/09/16 1405 06/09/16 1411 06/10/16 0906  NA 136 135 137  K 4.5 4.4 4.5  CL 101 99* 103  CO2 25  --  25  GLUCOSE 314* 317* 231*  BUN 68* 61* 70*  CREATININE 3.95* 3.90* 2.89*  CALCIUM 9.1  --  8.9   Liver Function Tests:  Recent Labs Lab 06/09/16 1405  AST 19  ALT 20  ALKPHOS 76  BILITOT 0.5  PROT 7.1  ALBUMIN 4.0   CBC:  Recent Labs Lab 06/09/16 1405 06/09/16 1411  WBC 4.8  --   NEUTROABS 3.4  --   HGB 9.2* 9.9*  HCT 26.7* 29.0*  MCV 88.1  --   PLT 190  --    Cardiac Enzymes:  Recent Labs Lab 06/09/16 1405 06/09/16 1718 06/09/16 2154  CKTOTAL 217  --   --   TROPONINI  --  0.04* 0.04*   BNP (last 3 results)  Recent Labs  12/16/15 1913 01/09/16 1559 01/28/16 2207  BNP 1348.3* 500.3* 457.9*   CBG:  Recent Labs Lab 06/09/16 1726 06/09/16 1909 06/09/16 2100 06/10/16 0740 06/10/16 1159  GLUCAP 295* 298* 348* 181* 197*    Recent Results (from the past 240 hour(s))  MRSA PCR Screening     Status: None   Collection Time: 06/09/16  7:15 PM  Result Value Ref Range Status   MRSA by PCR NEGATIVE NEGATIVE Final    Comment:        The GeneXpert MRSA Assay (FDA approved for NASAL specimens only), is one component of a comprehensive MRSA colonization surveillance program. It is not intended to diagnose MRSA infection nor to guide or monitor treatment for MRSA infections.      Studies: No results found.  Scheduled Meds: . diltiazem  240 mg Oral Daily  . divalproex  250 mg Oral BID  . docusate sodium  100 mg Oral BID  . feeding supplement (NEPRO CARB STEADY)  237 mL Oral BID BM  . ferrous sulfate  325 mg Oral Q breakfast  . heparin  5,000 Units Subcutaneous Q8H  . insulin aspart  0-9 Units Subcutaneous TID WC  . insulin detemir  20 Units Subcutaneous Daily  . isosorbide mononitrate  30 mg Oral Daily  . pantoprazole  40 mg Oral Daily   Continuous Infusions: . sodium chloride 75 mL/hr at 06/10/16 1528     Active Problems:   Acute renal failure superimposed on stage 3 chronic kidney disease (HCC)   Essential hypertension   Diabetes mellitus with complication (HCC)   AKI (acute kidney injury) (HCC)   Near syncope    Time spent: 30 minutes   Vassie LollMadera, Anaysha Andre  [facility] Pager [phone]. If 7PM-7AM, please contact night-coverage at [email or web address], password [email or web address] 06/10/2016, 5:01 PM

## 2016-06-10 NOTE — Progress Notes (Signed)
Initial Nutrition Assessment  DOCUMENTATION CODES:   Not applicable  INTERVENTION:  -RD to continue to monitor for needs -Continue Nepro Shake po bid, each supplement provides 425 kcal and 19 grams protein  NUTRITION DIAGNOSIS:   Inadequate oral intake related to poor appetite as evidenced by per patient/family report.  GOAL:   Patient will meet greater than or equal to 90% of their needs  MONITOR:   PO intake, Labs, I & O's, Weight trends  REASON FOR ASSESSMENT:   Malnutrition Screening Tool    ASSESSMENT:   Clayton [name] is a 65 y.o. male with medical history significant of CKD stage III cr per records 2.0 to 2.4 ,DM, he was deem with no capacity last admision. He presents to ED with complain of lightheaded and chest pain.   Mr. Clayton [name] did not want to speak with me during my visit. Per chart review, he was deemed "with no capacity," last admission. Looking at him, he does not appear to exhibit muscle wasting or fat depletions, but refused NFPE.  His weight exhibits fluctuations from:  183# 10/16 1 168# 12/2015  215# 01/2016  193# on admission.  Documented PO intake thus far 100% Does not seem at risk for poor intake at this time. NeprO shakes BID ordered via ONS protocol.  Will follow-up when appropriate, if patient appears to be verbal ( was non-verbal during visit) or family comes.  Labs and Medications reviewed: CBGs 181-348; Docusate  Diet Order:  Diet renal/carb modified with fluid restriction Diet-HS Snack?: Nothing; Room service appropriate?: Yes; Fluid consistency:: Thin  Skin:  Reviewed, no issues  Last BM:  PTA  Height:   Ht Readings from Last 1 Encounters:  06/09/16 6' (1.829 m)    Weight:   Wt Readings from Last 1 Encounters:  06/10/16 193 lb 3.2 oz (87.635 kg)    Ideal Body Weight:  80.9 kg  BMI:  Body mass index is 26.2 kg/(m^2).  Estimated Nutritional Needs:   Kcal:  1750-2200 calories  Protein:  88-100 grams  Fluid:   >/= 1.75L  EDUCATION NEEDS:   No education needs identified at this time  Dionne AnoWilliam M. Tinslee Klare, MS, RD LDN Inpatient Clinical Dietitian Pager [phone]

## 2016-06-10 NOTE — Progress Notes (Signed)
Patient refused morning labs.

## 2016-06-11 LAB — GLUCOSE, CAPILLARY
GLUCOSE-CAPILLARY: 254 mg/dL — AB (ref 65–99)
GLUCOSE-CAPILLARY: 249 mg/dL — AB (ref 65–99)
Glucose-Capillary: 141 mg/dL — ABNORMAL HIGH (ref 65–99)
Glucose-Capillary: 251 mg/dL — ABNORMAL HIGH (ref 65–99)

## 2016-06-11 NOTE — Progress Notes (Signed)
[facility] PROGRESS NOTE  Clayton [name] ZOX:[id number] DOB: 1951/11/21 DOA: 06/09/2016 PCP: PROVIDER NOT IN SYSTEM  Interim summary and history of present illness 65 y.o. male with medical history significant of CKD stage III cr per records 2.0 to 2.4 ,DM, he was deem with no capacity last admision. He presents to ED with complain of lightheaded and chest pain.  He relates that he lives at a facility. He went to church today. On his way to get food he started to feel lightheaded. He relates it was really hot outside. He was found by a pedestrian inclined to the wall of a building per nurse ED report. EMS was called at that time. Patient relates chest pain at that time, sharp in quality, middle of the chest.   Slowly responding, renal function improvement with fluid resuscitation and there is no signs of active infection. Will require nursing home for further care and rehabilitation at discharge.  Assessment/Plan: 1-Near syncope; -Could be related to hypovolemia/dehydration and heat stroke (patient reported to be exposed to hot weather for prolonged period of time).  -Will discontinue telemetry  -Repeat orthostatic vitals.  -Continue IV fluids resuscitation  -Follow clinical response.  2-Chest pain;  -No chest pain, no shortness of breath -Troponins with Betty mild elevation (most likely secondary to abnormal kidney function) -Will continue Imdur.  -Nitroglycerin PRN for chest pain.  3-Acute on chronic Renal failure stage III -Baseline 1.9---2.4 -Cr on admission at 3.9.  -UA without signs of infection -Will continue holding nephrotoxic agents -Will continue IV fluids resuscitation, but will adjust rate -Cr trending down  4-DM2 with nephropathy and hyperglycemia;  -Will continue sliding scale insulin -Continue Levemir -Follow CBGs and adjust hypoglycemic regimen as needed -Modified carbohydrates diet has been ordered  5-Anemia;  -Appears to be anemia of chronic  disease. -Hemoglobin has remained stable -No signs of overt bleeding -Will follow hemoglobin trend  6-GERD -Will continue PPI  7-mood disorder and schizophrenia -Appears to be a stable -No hallucinations or suicidal ideation -Will continue Depakote  8-essential hypertension -BP slightly elevated -Will continue the use of Inderal and Cardizem -His other antihypertensive agent has been placed on hold secondary to acute on chronic renal failure.  9-physical deconditioning: -will need SNF at discharge  Code Status: Full code Family Communication: No family at bedside Disposition Plan: Anticipate discharge to nursing home once medically stable. Still with elevation of his creatinine and with high BUN. Mentation improving  Consultants:  None  Procedures:  See below for x-ray reports  Antibiotics:  None  HPI/Subjective: Patient is less somnolent today; oriented X2 and with some improvement in his insight. No CP, no SOB, no nausea or vomiting. Reports to be hungry. Patient refused labs and orthostatic VS today.  Objective: Filed Vitals:   06/11/16 0958 06/11/16 1355  BP: 153/82 130/72  Pulse:  73  Temp:  98.7 F (37.1 C)  Resp:  15    Intake/Output Summary (Last 24 hours) at 06/11/16 1606 Last data filed at 06/10/16 2300  Gross per 24 hour  Intake 1363.33 ml  Output      0 ml  Net 1363.33 ml   Filed Weights   06/09/16 2035 06/10/16 0455 06/11/16 0450  Weight: 86.274 kg (190 lb 3.2 oz) 87.635 kg (193 lb 3.2 oz) 88.2 kg (194 lb 7.1 oz)    Exam:   General:  Alert, awake and oriented 2; able to follow commands properly. No fever, denies chest pain and shortness of breath. Patient reports no  nausea, no vomiting and no abdominal pain.  Cardiovascular: S1 and S2, no rubs, no gallops, no JVD on exam  Respiratory: Clear to auscultation bilaterally, good oxygen saturation on room air and no use of accessory muscles.  Abdomen: Soft, nondistended, no guarding,  positive bowel sounds  Musculoskeletal: No edema, no cyanosis or clubbing  Data Reviewed: Basic Metabolic Panel:  Recent Labs Lab 06/09/16 1405 06/09/16 1411 06/10/16 0906  NA 136 135 137  K 4.5 4.4 4.5  CL 101 99* 103  CO2 25  --  25  GLUCOSE 314* 317* 231*  BUN 68* 61* 70*  CREATININE 3.95* 3.90* 2.89*  CALCIUM 9.1  --  8.9   Liver Function Tests:  Recent Labs Lab 06/09/16 1405  AST 19  ALT 20  ALKPHOS 76  BILITOT 0.5  PROT 7.1  ALBUMIN 4.0   CBC:  Recent Labs Lab 06/09/16 1405 06/09/16 1411  WBC 4.8  --   NEUTROABS 3.4  --   HGB 9.2* 9.9*  HCT 26.7* 29.0*  MCV 88.1  --   PLT 190  --    Cardiac Enzymes:  Recent Labs Lab 06/09/16 1405 06/09/16 1718 06/09/16 2154  CKTOTAL 217  --   --   TROPONINI  --  0.04* 0.04*   BNP (last 3 results)  Recent Labs  12/16/15 1913 01/09/16 1559 01/28/16 2207  BNP 1348.3* 500.3* 457.9*   CBG:  Recent Labs Lab 06/10/16 1159 06/10/16 1640 06/10/16 2137 06/11/16 0740 06/11/16 1128  GLUCAP 197* 119* 75 141* 251*    Recent Results (from the past 240 hour(s))  MRSA PCR Screening     Status: None   Collection Time: 06/09/16  7:15 PM  Result Value Ref Range Status   MRSA by PCR NEGATIVE NEGATIVE Final    Comment:        The GeneXpert MRSA Assay (FDA approved for NASAL specimens only), is one component of a comprehensive MRSA colonization surveillance program. It is not intended to diagnose MRSA infection nor to guide or monitor treatment for MRSA infections.      Studies: No results found.  Scheduled Meds: . diltiazem  240 mg Oral Daily  . divalproex  250 mg Oral BID  . docusate sodium  100 mg Oral BID  . feeding supplement (NEPRO CARB STEADY)  237 mL Oral BID BM  . ferrous sulfate  325 mg Oral Q breakfast  . heparin  5,000 Units Subcutaneous Q8H  . insulin aspart  0-9 Units Subcutaneous TID WC  . insulin detemir  20 Units Subcutaneous Daily  . isosorbide mononitrate  30 mg Oral Daily   . pantoprazole  40 mg Oral Daily   Continuous Infusions: . sodium chloride 75 mL/hr at 06/11/16 0348    Active Problems:   Acute renal failure superimposed on stage 3 chronic kidney disease (HCC)   Essential hypertension   Diabetes mellitus with complication (HCC)   AKI (acute kidney injury) (HCC)   Near syncope    Time spent: 30 minutes   Vassie LollMadera, Patton Swisher  [facility] Pager [phone]. If 7PM-7AM, please contact night-coverage at [email or web address], password [email or web address] 06/11/2016, 4:06 PM  LOS: 1 day

## 2016-06-11 NOTE — Progress Notes (Signed)
Patient is refusing neuro checks pt is responsive to voice, an states that " I'm fine" , and upon obtaining vitals signs pt becomes irritated and attempted to strike technician. Pt did agree to take medications.

## 2016-06-11 NOTE — Progress Notes (Signed)
Pt refused to have orthostatics vital signs performed.

## 2016-06-12 DIAGNOSIS — N183 Chronic kidney disease, stage 3 (moderate): Secondary | ICD-10-CM

## 2016-06-12 DIAGNOSIS — N179 Acute kidney failure, unspecified: Principal | ICD-10-CM

## 2016-06-12 DIAGNOSIS — R55 Syncope and collapse: Secondary | ICD-10-CM

## 2016-06-12 DIAGNOSIS — E118 Type 2 diabetes mellitus with unspecified complications: Secondary | ICD-10-CM

## 2016-06-12 DIAGNOSIS — I10 Essential (primary) hypertension: Secondary | ICD-10-CM

## 2016-06-12 LAB — GLUCOSE, CAPILLARY
Glucose-Capillary: 139 mg/dL — ABNORMAL HIGH (ref 65–99)
Glucose-Capillary: 240 mg/dL — ABNORMAL HIGH (ref 65–99)

## 2016-06-12 LAB — CBC
HEMATOCRIT: 24.7 % — AB (ref 39.0–52.0)
Hemoglobin: 8.2 g/dL — ABNORMAL LOW (ref 13.0–17.0)
MCH: 29.9 pg (ref 26.0–34.0)
MCHC: 33.2 g/dL (ref 30.0–36.0)
MCV: 90.1 fL (ref 78.0–100.0)
Platelets: 152 10*3/uL (ref 150–400)
RBC: 2.74 MIL/uL — ABNORMAL LOW (ref 4.22–5.81)
RDW: 14.1 % (ref 11.5–15.5)
WBC: 4.5 10*3/uL (ref 4.0–10.5)

## 2016-06-12 LAB — BASIC METABOLIC PANEL
Anion gap: 7 (ref 5–15)
BUN: 49 mg/dL — AB (ref 6–20)
CALCIUM: 8.5 mg/dL — AB (ref 8.9–10.3)
CO2: 22 mmol/L (ref 22–32)
Chloride: 105 mmol/L (ref 101–111)
Creatinine, Ser: 1.87 mg/dL — ABNORMAL HIGH (ref 0.61–1.24)
GFR calc Af Amer: 43 mL/min — ABNORMAL LOW (ref >60)
GFR, EST NON AFRICAN AMERICAN: 37 mL/min — AB (ref >60)
GLUCOSE: 194 mg/dL — AB (ref 65–99)
Potassium: 4.5 mmol/L (ref 3.5–5.1)
Sodium: 134 mmol/L — ABNORMAL LOW (ref 135–145)

## 2016-06-12 MED ORDER — FUROSEMIDE 80 MG PO TABS: 40.0000 mg | ORAL_TABLET | Freq: Two times a day (BID) | ORAL | Status: DC

## 2016-06-12 NOTE — Discharge Summary (Signed)
Clayton [name], is a 65 y.o. male  DOB [birth year]  MRN [id number].  Admission date:  06/09/2016  Admitting Physician  Alba Cory, MD  Discharge Date:  06/12/2016   Primary MD  PROVIDER NOT IN SYSTEM  Recommendations for primary care physician for things to follow:   Patient is discharged home with instructions to follow-up with a primary care physician within 7 days, patient will need a close follow-up on his kidney function and electrolytes. His medications have been modified to prevent worsening kidney function and electrolytes disturbances. His furosemide has been decreased from 80 to 40 mg twice daily, his Aldactone and potassium supplements were discontinued. Patient will continue insulin therapy but he is oral hypoglycemic agents were discontinued to avoid hypoglycemia.   Admission Diagnosis  Acute kidney injury (HCC) [N17.9]   Discharge Diagnosis  Acute kidney injury (HCC) [N17.9]                                          Dehydration                                        Anemia of chronic disease                                        GERD                                        Mood disorder with schizophrenia                                        T2DM                                         Stage III chronic kidney disease                                         Active Problems:   Acute renal failure superimposed on stage 3 chronic kidney disease (HCC)   Essential hypertension   Diabetes mellitus with complication (HCC)   AKI (acute kidney injury) (HCC)   Near syncope      Past Medical History  Diagnosis Date  . CKD (chronic kidney disease), stage III   . Anemia   . HTN (hypertension)   . Type II diabetes mellitus (HCC)   .  DVT (deep venous thrombosis) (HCC)     LLE  . Pneumonia 12/06/2015  . GERD (gastroesophageal reflux disease)   . Migraine     "a few times/month"  (12/06/2015)  . Depression     Past Surgical History  Procedure Laterality Date  . No past surgeries         HPI  from the history and physical done on the day of admission:    This is a 64 year old gentleman who presents to hospital with the chief complaint of lightheadedness and chest pain, apparently he had spent a significant amount time in the hot outside environment, he was found by a pedestrian inclined to the wall of a building and he was transferred to the emergency room for evaluation. On his initial physical examination his blood pressure was 149 systolic, heart rate 91, temperature 98.2, respiratory 16, oxygen saturation 98%. His mucous membranes were found to be moist, neck supple, lungs clear to auscultation, heart S1-S2 present and rhythmic, abdomen soft, nontender, extremities no edema, neurologically patient was grossly nonfocal. His initial creatinine was 3.95, potassium 4.5, sodium 136, chloride 25. His hemoglobin was 9.2 with hematocrit 26.7. His urinalysis was negative for infection. His toxicology was negative. His EKG showed a sinus rhythm with premature atrial complexes and a right bundle branch block.  Patient was admitted to hospital with the working diagnosis of acute kidney injury and chronic kidney disease related to dehydration and hypovolemia complicated by near syncope episode.     Hospital Course:    1. Cardiovascular. Near syncope. Patient's symptoms were presumptively related to hypovolemic dehydration. Patient tolerated well IV fluids. His troponins remain at 0.04, mild elevation were presumptively related to chronic kidney disease. Hypertension. Blood pressure remained stable with a systolic values between 130 and 180. Patient was continued on diltiazem, isosorbide, hydralazine.  2. Pulmonary. No signs of volume overload or pulmonary infection. Patient had oximetry monitor.  3. Nephrology. Acute kidney injury chronic kidney disease. Patient responded  well to IV fluids. His discharge creatinine is 1.87 with a potassium 4.5. While he was in hospital he is diuretics were held. By the time of discharge his furosemide will be resumed at half of his initial dose, 40 mg twice daily, his Aldactone and potassium supplements were discontinued.  4. Endocrinology. Type 2 diabetes mellitus. Patient had insulin therapy continued while he was in hospital, serum glucose range between 254 and 139, due to his significant decrease glomerular filtration rate, and kidney disease stage III, we'll hold on further hypoglycemic agents and will continue on insulin therapy with a close follow-up.  5. Neurology. Mood disorder and schizophrenia. Patient will continue on Depakote and thoracic spine without major complications. Patient will be seen by physical therapy for discharge.   Discharge Condition: Stable  Follow UP primary care physician with 7 days     Consults obtained - non-  Diet and Activity recommendation: See Discharge Instructions below  Discharge Instructions     Discharge Instructions    Diet - low sodium heart healthy    Complete by:  As directed      Discharge instructions    Complete by:  As directed   Please follow with primary care provider in 7 days     Increase activity slowly    Complete by:  As directed              Discharge Medications       Medication List    STOP taking these medications  alum & mag hydroxide-simeth 400-400-40 MG/5ML suspension  Commonly known as:  MAALOX PLUS     glipiZIDE 5 MG tablet  Commonly known as:  GLUCOTROL     glyBURIDE 5 MG tablet  Commonly known as:  DIABETA     potassium chloride 10 MEQ tablet  Commonly known as:  K-DUR     spironolactone 50 MG tablet  Commonly known as:  ALDACTONE      TAKE these medications        diltiazem 240 MG 24 hr capsule  Commonly known as:  CARTIA XT  Take 1 capsule (240 mg total) by mouth daily.     divalproex 125 MG capsule  Commonly  known as:  DEPAKOTE SPRINKLE  Take 250 mg by mouth 2 (two) times daily.     ferrous sulfate 325 (65 FE) MG tablet  Take 1 tablet (325 mg total) by mouth daily with breakfast.     furosemide 80 MG tablet  Commonly known as:  LASIX  Take 0.5 tablets (40 mg total) by mouth 2 (two) times daily.     guaiFENesin 600 MG 12 hr tablet  Commonly known as:  MUCINEX  Take 1 tablet (600 mg total) by mouth 2 (two) times daily as needed.     haloperidol 1 MG tablet  Commonly known as:  HALDOL  Take 1 mg by mouth 2 (two) times daily.     hydrALAZINE 50 MG tablet  Commonly known as:  APRESOLINE  Take 1 tablet (50 mg total) by mouth 3 (three) times daily.     insulin aspart 100 UNIT/ML injection  Commonly known as:  novoLOG  0-15 Units, Subcutaneous, 3 times daily with meals CBG < 70: implement hypoglycemia protocol CBG 70 - 120: 0 units CBG 121 - 150: 2 units CBG 151 - 200: 3 units CBG 201 - 250: 5 units CBG 251 - 300: 8 units CBG 301 - 350: 11 units CBG 351 - 400: 15 units CBG > 400: call MD     insulin detemir 100 UNIT/ML injection  Commonly known as:  LEVEMIR  Inject 0.25 mLs (25 Units total) into the skin daily.     insulin lispro 100 UNIT/ML injection  Commonly known as:  HUMALOG  Inject 2-10 Units into the skin 3 (three) times daily with meals. Per sliding scale: 201-250= 2 units; 251-300= 4 units; 301-350= 6 units; 351-400= 8 units; if above 401= 10 units, recheck in 1 hours if still above 401 call MD.     ipratropium-albuterol 0.5-2.5 (3) MG/3ML Soln  Commonly known as:  DUONEB  Take 3 mLs by nebulization every 4 (four) hours as needed.     isosorbide mononitrate 30 MG 24 hr tablet  Commonly known as:  IMDUR  Take 1 tablet (30 mg total) by mouth daily.     LORazepam 0.5 MG tablet  Commonly known as:  ATIVAN  Take 0.5 mg by mouth 2 (two) times daily as needed for anxiety (breakthrough anxiety).     omeprazole 20 MG capsule  Commonly known as:  PRILOSEC  Take 20 mg by mouth  daily.     ranitidine 150 MG tablet  Commonly known as:  ZANTAC  Take 150 mg by mouth 2 (two) times daily.     senna 8.6 MG tablet  Commonly known as:  SENOKOT  Take 2 tablets by mouth at bedtime.        Major procedures and Radiology Reports - None  No results found.  Micro  Results     Recent Results (from the past 240 hour(s))  MRSA PCR Screening     Status: None   Collection Time: 06/09/16  7:15 PM  Result Value Ref Range Status   MRSA by PCR NEGATIVE NEGATIVE Final    Comment:        The GeneXpert MRSA Assay (FDA approved for NASAL specimens only), is one component of a comprehensive MRSA colonization surveillance program. It is not intended to diagnose MRSA infection nor to guide or monitor treatment for MRSA infections.        Today   Subjective    Clayton [name] is feeling much better, anxious to be released, denies any chest pain or shortness of breath. Patient will be seen by physical therapy for discharge. Objective   Blood pressure 181/89, pulse 79, temperature 98 F (36.7 C), temperature source Oral, resp. rate 16, height 6' (1.829 m), weight 93.26 kg (205 lb 9.6 oz), SpO2 100 %.   Intake/Output Summary (Last 24 hours) at 06/12/16 1103 Last data filed at 06/12/16 0707  Gross per 24 hour  Intake   1100 ml  Output   1000 ml  Net    100 ml    Exam Gen. Awake and alert Oral mucosa moist Neck supple Lungs are clear to pulsation bilaterally, no wheezing, rales, or rhonchi. Heart S1-S2 present rhythmic no gallops or murmurs. Abdomen soft, nontender, nondistended. Extremities no edema Exam nonfocal Skin no rashes   Data Review   CBC w Diff: Lab Results  Component Value Date   WBC 4.5 06/12/2016   HGB 8.2* 06/12/2016   HCT 24.7* 06/12/2016   PLT 152 06/12/2016   LYMPHOPCT 15 06/09/2016   MONOPCT 10 06/09/2016   EOSPCT 3 06/09/2016   BASOPCT 0 06/09/2016    CMP: Lab Results  Component Value Date   NA 134* 06/12/2016   K 4.5  06/12/2016   CL 105 06/12/2016   CO2 22 06/12/2016   BUN 49* 06/12/2016   CREATININE 1.87* 06/12/2016   PROT 7.1 06/09/2016   ALBUMIN 4.0 06/09/2016   BILITOT 0.5 06/09/2016   ALKPHOS 76 06/09/2016   AST 19 06/09/2016   ALT 20 06/09/2016  .   Total Time in preparing paper work, data evaluation and todays exam - 45 minutes  Coralie KeensMauricio Daniel Arrien M.D on 06/12/2016 at 11:03 AM  [facility]   Office  [phone]

## 2016-06-12 NOTE — Progress Notes (Signed)
PT Cancellation Note / Screen  Patient Details Name: Clayton [name] MRN: [id number] DOB: 1951-09-07   Cancelled Treatment:    Reason Eval/Treat Not Completed: PT screened, no needs identified, will sign off Pt to d/c to SNF today.  Per CSW, no PT evaluation required prior to d/c.  Will defer therapy needs to SNF.   Austin Herd,KATHrine E 06/12/2016, 12:57 PM Zenovia JarredKati Waldemar Siegel, PT, DPT 06/12/2016 Pager: [phone]

## 2016-06-12 NOTE — Clinical Social Work Placement (Addendum)
Patient is set to [facility] back [facility] today. Patient made aware & message left for [employer] [facility]. Discharge packet given to RN, Annabelle Harmanana. [facility] called for transport to pickup 2:00pm.   CSW confirmed with Silvio PateShelia at [facility] that they are in the process of getting guardianship for patient.     Lincoln MaxinKelly Amrit Erck, LCSW [facility] Clinical Social Worker cell #: [phone]    CLINICAL SOCIAL WORK PLACEMENT  NOTE  Date:  06/12/2016  Patient Details  Name: Clayton [name] MRN: [id number] Date of Birth: 1951/06/23  Clinical Social Work is seeking post-discharge placement for this patient at the Skilled  Nursing Facility level of care (*CSW will initial, date and re-position this form in  chart as items are completed):  Yes   Patient/family provided with [facility] Clinical Social Work Department's list of facilities offering this level of care within the geographic area requested by the patient (or if unable, by the patient's family).  Yes   Patient/family informed of their freedom to choose among providers that offer the needed level of care, that participate in Medicare, Medicaid or managed care program needed by the patient, have an available bed and are willing to accept the patient.  Yes   Patient/family informed of [place]'s ownership interest in [facility] and [facility], as well as of the fact that they are under no obligation to receive care at these facilities.  PASRR submitted to EDS on 06/12/16     PASRR number received on 06/12/16     Existing PASRR number confirmed on       FL2 transmitted to all facilities in geographic area requested by pt/family on 06/12/16     FL2 transmitted to all facilities within larger geographic area on       Patient informed that his/her managed care company has contracts with or will negotiate with certain facilities, including the following:        Yes   Patient/family informed of bed  offers received.  [facility]     Physician recommends and patient chooses bed at      [facility].  Patient to be transferred to facility by [facility]     Patient family notified on 06/12/16 of transfer.  Name of family member notified:  [facility]     PHYSICIAN       Additional Comment:    _______________________________________________ Arlyss RepressHarrison, Reymundo Winship F, LCSW 06/12/2016, 12:07 PM

## 2016-06-18 ENCOUNTER — Inpatient Hospital Stay (HOSPITAL_COMMUNITY)
Admission: EM | Admit: 2016-06-18 | Discharge: 2016-06-21 | DRG: 682 | Disposition: A | Discharge status: Skilled Nursing Facility | Payer: Medicaid Other | Attending: Internal Medicine | Admitting: Internal Medicine

## 2016-06-18 ENCOUNTER — Emergency Department (HOSPITAL_COMMUNITY): Payer: Medicaid Other

## 2016-06-18 ENCOUNTER — Encounter (HOSPITAL_COMMUNITY): Payer: Self-pay | Admitting: Emergency Medicine

## 2016-06-18 DIAGNOSIS — Z91040 Latex allergy status: Secondary | ICD-10-CM

## 2016-06-18 DIAGNOSIS — I10 Essential (primary) hypertension: Secondary | ICD-10-CM | POA: Diagnosis not present

## 2016-06-18 DIAGNOSIS — D638 Anemia in other chronic diseases classified elsewhere: Secondary | ICD-10-CM | POA: Diagnosis not present

## 2016-06-18 DIAGNOSIS — N189 Chronic kidney disease, unspecified: Secondary | ICD-10-CM

## 2016-06-18 DIAGNOSIS — N179 Acute kidney failure, unspecified: Secondary | ICD-10-CM | POA: Diagnosis not present

## 2016-06-18 DIAGNOSIS — R739 Hyperglycemia, unspecified: Secondary | ICD-10-CM

## 2016-06-18 DIAGNOSIS — Z9114 Patient's other noncompliance with medication regimen: Secondary | ICD-10-CM

## 2016-06-18 DIAGNOSIS — R1110 Vomiting, unspecified: Secondary | ICD-10-CM

## 2016-06-18 DIAGNOSIS — E860 Dehydration: Secondary | ICD-10-CM | POA: Diagnosis not present

## 2016-06-18 DIAGNOSIS — Z794 Long term (current) use of insulin: Secondary | ICD-10-CM

## 2016-06-18 DIAGNOSIS — Z79899 Other long term (current) drug therapy: Secondary | ICD-10-CM

## 2016-06-18 DIAGNOSIS — Z823 Family history of stroke: Secondary | ICD-10-CM

## 2016-06-18 DIAGNOSIS — K219 Gastro-esophageal reflux disease without esophagitis: Secondary | ICD-10-CM | POA: Diagnosis present

## 2016-06-18 DIAGNOSIS — F329 Major depressive disorder, single episode, unspecified: Secondary | ICD-10-CM | POA: Diagnosis present

## 2016-06-18 DIAGNOSIS — I130 Hypertensive heart and chronic kidney disease with heart failure and stage 1 through stage 4 chronic kidney disease, or unspecified chronic kidney disease: Secondary | ICD-10-CM | POA: Diagnosis present

## 2016-06-18 DIAGNOSIS — N289 Disorder of kidney and ureter, unspecified: Secondary | ICD-10-CM

## 2016-06-18 DIAGNOSIS — R9431 Abnormal electrocardiogram [ECG] [EKG]: Secondary | ICD-10-CM | POA: Diagnosis present

## 2016-06-18 DIAGNOSIS — N184 Chronic kidney disease, stage 4 (severe): Secondary | ICD-10-CM | POA: Diagnosis present

## 2016-06-18 DIAGNOSIS — E118 Type 2 diabetes mellitus with unspecified complications: Secondary | ICD-10-CM

## 2016-06-18 DIAGNOSIS — I4510 Unspecified right bundle-branch block: Secondary | ICD-10-CM | POA: Diagnosis present

## 2016-06-18 DIAGNOSIS — Z833 Family history of diabetes mellitus: Secondary | ICD-10-CM

## 2016-06-18 DIAGNOSIS — F209 Schizophrenia, unspecified: Secondary | ICD-10-CM | POA: Diagnosis present

## 2016-06-18 DIAGNOSIS — R112 Nausea with vomiting, unspecified: Secondary | ICD-10-CM | POA: Diagnosis present

## 2016-06-18 DIAGNOSIS — F1910 Other psychoactive substance abuse, uncomplicated: Secondary | ICD-10-CM | POA: Diagnosis present

## 2016-06-18 DIAGNOSIS — E1122 Type 2 diabetes mellitus with diabetic chronic kidney disease: Secondary | ICD-10-CM | POA: Diagnosis present

## 2016-06-18 DIAGNOSIS — I5032 Chronic diastolic (congestive) heart failure: Secondary | ICD-10-CM | POA: Diagnosis present

## 2016-06-18 DIAGNOSIS — E1100 Type 2 diabetes mellitus with hyperosmolarity without nonketotic hyperglycemic-hyperosmolar coma (NKHHC): Secondary | ICD-10-CM | POA: Diagnosis present

## 2016-06-18 HISTORY — DX: Patient's noncompliance with other medical treatment and regimen: Z91.19

## 2016-06-18 HISTORY — DX: Other psychoactive substance abuse, uncomplicated: F19.10

## 2016-06-18 HISTORY — DX: Patient's noncompliance with other medical treatment and regimen due to unspecified reason: Z91.199

## 2016-06-18 HISTORY — DX: Heart failure, unspecified: I50.9

## 2016-06-18 LAB — CBC WITH DIFFERENTIAL/PLATELET
BASOS ABS: 0 10*3/uL (ref 0.0–0.1)
Basophils Relative: 0 %
EOS PCT: 0 %
Eosinophils Absolute: 0 10*3/uL (ref 0.0–0.7)
HCT: 29 % — ABNORMAL LOW (ref 39.0–52.0)
Hemoglobin: 9.5 g/dL — ABNORMAL LOW (ref 13.0–17.0)
LYMPHS PCT: 11 %
Lymphs Abs: 0.8 10*3/uL (ref 0.7–4.0)
MCH: 30.1 pg (ref 26.0–34.0)
MCHC: 32.8 g/dL (ref 30.0–36.0)
MCV: 91.8 fL (ref 78.0–100.0)
Monocytes Absolute: 0.6 10*3/uL (ref 0.1–1.0)
Monocytes Relative: 8 %
NEUTROS ABS: 5.9 10*3/uL (ref 1.7–7.7)
NEUTROS PCT: 81 %
PLATELETS: 207 10*3/uL (ref 150–400)
RBC: 3.16 MIL/uL — AB (ref 4.22–5.81)
RDW: 14.2 % (ref 11.5–15.5)
WBC: 7.3 10*3/uL (ref 4.0–10.5)

## 2016-06-18 LAB — COMPREHENSIVE METABOLIC PANEL
ALT: 22 U/L (ref 17–63)
AST: 26 U/L (ref 15–41)
Albumin: 3.5 g/dL (ref 3.5–5.0)
Alkaline Phosphatase: 57 U/L (ref 38–126)
Anion gap: 12 (ref 5–15)
BUN: 51 mg/dL — ABNORMAL HIGH (ref 6–20)
CHLORIDE: 98 mmol/L — AB (ref 101–111)
CO2: 29 mmol/L (ref 22–32)
Calcium: 9.7 mg/dL (ref 8.9–10.3)
Creatinine, Ser: 2.46 mg/dL — ABNORMAL HIGH (ref 0.61–1.24)
GFR, EST AFRICAN AMERICAN: 31 mL/min — AB (ref >60)
GFR, EST NON AFRICAN AMERICAN: 26 mL/min — AB (ref >60)
Glucose, Bld: 336 mg/dL — ABNORMAL HIGH (ref 65–99)
POTASSIUM: 4 mmol/L (ref 3.5–5.1)
Sodium: 139 mmol/L (ref 135–145)
Total Bilirubin: 0.8 mg/dL (ref 0.3–1.2)
Total Protein: 6.9 g/dL (ref 6.5–8.1)

## 2016-06-18 LAB — GLUCOSE, CAPILLARY
Glucose-Capillary: 246 mg/dL — ABNORMAL HIGH (ref 65–99)
Glucose-Capillary: 246 mg/dL — ABNORMAL HIGH (ref 65–99)

## 2016-06-18 LAB — I-STAT VENOUS BLOOD GAS, ED
ACID-BASE EXCESS: 11 mmol/L — AB (ref 0.0–2.0)
BICARBONATE: 36.6 meq/L — AB (ref 20.0–24.0)
O2 Saturation: 89 %
PH VEN: 7.507 — AB (ref 7.250–7.300)
PO2 VEN: 52 mmHg — AB (ref 31.0–45.0)
TCO2: 38 mmol/L (ref 0–100)
pCO2, Ven: 46.1 mmHg (ref 45.0–50.0)

## 2016-06-18 LAB — LIPASE, BLOOD: LIPASE: 15 U/L (ref 11–51)

## 2016-06-18 LAB — MAGNESIUM: Magnesium: 1.9 mg/dL (ref 1.7–2.4)

## 2016-06-18 LAB — VALPROIC ACID LEVEL: Valproic Acid Lvl: <10 ug/mL — ABNORMAL LOW (ref 50.0–100.0)

## 2016-06-18 MED ORDER — FOLIC ACID 5 MG/ML IJ SOLN
1.0000 mg | Freq: Every day | INTRAMUSCULAR | Status: DC
  Administered 2016-06-18 – 2016-06-21 (×4): 1 mg via INTRAVENOUS
  Filled 2016-06-18 (×4): qty 0.2

## 2016-06-18 MED ORDER — PROCHLORPERAZINE EDISYLATE 5 MG/ML IJ SOLN
10.0000 mg | INTRAMUSCULAR | Status: DC | PRN
  Administered 2016-06-18 (×2): 10 mg via INTRAVENOUS
  Filled 2016-06-18 (×2): qty 2

## 2016-06-18 MED ORDER — IPRATROPIUM-ALBUTEROL 0.5-2.5 (3) MG/3ML IN SOLN: 3.0000 mL | RESPIRATORY_TRACT | Status: DC | PRN

## 2016-06-18 MED ORDER — METOPROLOL TARTRATE 5 MG/5ML IV SOLN
2.5000 mg | Freq: Four times a day (QID) | INTRAVENOUS | Status: DC | PRN
Start: 2016-06-18 — End: 2016-06-21
  Administered 2016-06-18: 2.5 mg via INTRAVENOUS
  Filled 2016-06-18: qty 5

## 2016-06-18 MED ORDER — ACETAMINOPHEN 650 MG RE SUPP: 650.0000 mg | Freq: Four times a day (QID) | RECTAL | Status: DC | PRN

## 2016-06-18 MED ORDER — PROMETHAZINE HCL 25 MG/ML IJ SOLN
12.5000 mg | Freq: Once | INTRAMUSCULAR | Status: AC
  Administered 2016-06-18: 12.5 mg via INTRAVENOUS
  Filled 2016-06-18: qty 1

## 2016-06-18 MED ORDER — SODIUM CHLORIDE 0.9 % IV SOLN
INTRAVENOUS | Status: DC
Start: 2016-06-18 — End: 2016-06-21
  Administered 2016-06-18 – 2016-06-20 (×6): via INTRAVENOUS

## 2016-06-18 MED ORDER — INSULIN ASPART 100 UNIT/ML ~~LOC~~ SOLN
0.0000 [IU] | SUBCUTANEOUS | Status: DC
  Administered 2016-06-18 (×2): 5 [IU] via SUBCUTANEOUS
  Administered 2016-06-19 (×2): 3 [IU] via SUBCUTANEOUS
  Administered 2016-06-20: 5 [IU] via SUBCUTANEOUS
  Administered 2016-06-20: 2 [IU] via SUBCUTANEOUS
  Administered 2016-06-20: 5 [IU] via SUBCUTANEOUS

## 2016-06-18 MED ORDER — METOPROLOL TARTRATE 5 MG/5ML IV SOLN: 5.0000 mg | Freq: Four times a day (QID) | INTRAVENOUS | Status: DC

## 2016-06-18 MED ORDER — ACETAMINOPHEN 325 MG PO TABS
650.0000 mg | ORAL_TABLET | Freq: Four times a day (QID) | ORAL | Status: DC | PRN
  Administered 2016-06-18 – 2016-06-20 (×2): 650 mg via ORAL
  Filled 2016-06-18 (×2): qty 2

## 2016-06-18 MED ORDER — HYDRALAZINE HCL 20 MG/ML IJ SOLN: 20.0000 mg | INTRAMUSCULAR | Status: DC

## 2016-06-18 MED ORDER — FERROUS SULFATE 325 (65 FE) MG PO TABS
325.0000 mg | ORAL_TABLET | Freq: Every day | ORAL | Status: DC
  Administered 2016-06-19 – 2016-06-21 (×3): 325 mg via ORAL
  Filled 2016-06-18 (×3): qty 1

## 2016-06-18 MED ORDER — HYDRALAZINE HCL 20 MG/ML IJ SOLN
5.0000 mg | Freq: Four times a day (QID) | INTRAMUSCULAR | Status: DC | PRN
  Administered 2016-06-18: 5 mg via INTRAVENOUS
  Filled 2016-06-18: qty 1

## 2016-06-18 MED ORDER — LABETALOL HCL 5 MG/ML IV SOLN
10.0000 mg | Freq: Once | INTRAVENOUS | Status: AC
  Administered 2016-06-18: 10 mg via INTRAVENOUS
  Filled 2016-06-18: qty 4

## 2016-06-18 MED ORDER — INSULIN ASPART 100 UNIT/ML ~~LOC~~ SOLN
6.0000 [IU] | Freq: Once | SUBCUTANEOUS | Status: AC
  Administered 2016-06-18: 6 [IU] via INTRAVENOUS
  Filled 2016-06-18: qty 1

## 2016-06-18 MED ORDER — SODIUM CHLORIDE 0.9% FLUSH
3.0000 mL | Freq: Two times a day (BID) | INTRAVENOUS | Status: DC
  Administered 2016-06-18 – 2016-06-20 (×4): 3 mL via INTRAVENOUS

## 2016-06-18 MED ORDER — HALOPERIDOL 2 MG PO TABS
2.0000 mg | ORAL_TABLET | Freq: Two times a day (BID) | ORAL | Status: DC
  Administered 2016-06-18 – 2016-06-21 (×6): 2 mg via ORAL
  Filled 2016-06-18 (×7): qty 1

## 2016-06-18 MED ORDER — LORAZEPAM 0.5 MG PO TABS
0.5000 mg | ORAL_TABLET | Freq: Two times a day (BID) | ORAL | Status: DC | PRN
  Administered 2016-06-18: 0.5 mg via ORAL
  Filled 2016-06-18: qty 1

## 2016-06-18 MED ORDER — SODIUM CHLORIDE 0.9 % IV BOLUS (SEPSIS)
1000.0000 mL | Freq: Once | INTRAVENOUS | Status: AC
  Administered 2016-06-18: 1000 mL via INTRAVENOUS

## 2016-06-18 MED ORDER — THIAMINE HCL 100 MG/ML IJ SOLN
100.0000 mg | Freq: Every day | INTRAMUSCULAR | Status: DC
  Administered 2016-06-18 – 2016-06-21 (×4): 100 mg via INTRAVENOUS
  Filled 2016-06-18 (×4): qty 2

## 2016-06-18 MED ORDER — INSULIN DETEMIR 100 UNIT/ML ~~LOC~~ SOLN
25.0000 [IU] | Freq: Every day | SUBCUTANEOUS | Status: DC
  Administered 2016-06-18 – 2016-06-21 (×4): 25 [IU] via SUBCUTANEOUS
  Filled 2016-06-18 (×4): qty 0.25

## 2016-06-18 MED ORDER — PROCHLORPERAZINE EDISYLATE 5 MG/ML IJ SOLN
10.0000 mg | Freq: Once | INTRAMUSCULAR | Status: AC
  Administered 2016-06-18: 10 mg via INTRAVENOUS
  Filled 2016-06-18: qty 2

## 2016-06-18 MED ORDER — SODIUM CHLORIDE 0.9 % IV BOLUS (SEPSIS): 1000.0000 mL | Freq: Once | INTRAVENOUS | Status: DC

## 2016-06-18 MED ORDER — SENNA 8.6 MG PO TABS
2.0000 | ORAL_TABLET | Freq: Every day | ORAL | Status: DC
  Administered 2016-06-18 – 2016-06-20 (×3): 17.2 mg via ORAL
  Filled 2016-06-18 (×4): qty 2

## 2016-06-18 MED ORDER — DIVALPROEX SODIUM 125 MG PO CSDR
250.0000 mg | DELAYED_RELEASE_CAPSULE | Freq: Two times a day (BID) | ORAL | Status: DC
  Administered 2016-06-18 – 2016-06-21 (×6): 250 mg via ORAL
  Filled 2016-06-18 (×7): qty 2

## 2016-06-18 MED ORDER — ENOXAPARIN SODIUM 40 MG/0.4ML ~~LOC~~ SOLN
40.0000 mg | SUBCUTANEOUS | Status: DC
  Administered 2016-06-18 – 2016-06-20 (×3): 40 mg via SUBCUTANEOUS
  Filled 2016-06-18 (×3): qty 0.4

## 2016-06-18 NOTE — ED Notes (Addendum)
Pt is [facility] [place] for a week he has had somed vomiting [facility] and  Today he had coffee ground vomit, c/o abd pain pt is diabetic has vomited x 4-5 today none during ride to hospital

## 2016-06-18 NOTE — H&P (Signed)
History and Physical    Clayton SafeVincent L Seltzer ZOX:[id number] DOB: 05-29-1951 DOA: 06/18/2016   PCP: PROVIDER NOT IN SYSTEM   Patient coming from/Resides with: Skilled nursing facility  Chief Complaint: Nausea and vomiting  HPI: Clayton SafeVincent L Espina is a 65 y.o. male with medical history significant for diabetes on insulin, schizophrenia, per tension, grade 1 diastolic dysfunction, prior polysubstance abuse to include alcohol and cocaine, chronic kidney disease, chronic prolonged QT interval ECG, history of medication noncompliance and homelessness currently residing in skilled nursing facility. Patient was recently discharged on 7/5 after an admission for acute kidney injury related to dehydration. Recent with near syncope at that time. Home at discharge his Lasix was decreased from 80 mg twice a day to 40 mg twice a day and it was documented that his Aldactone and potassium supplements were discontinued.  Patient returns to the ER today with reports of nausea and vomiting. She was found have acute kidney injury and hyperglycemia with a glucose greater than 300. Patient denies abdominal pain or diarrhea. History is somewhat limited due to patient's underlying psychiatric disorders and limited documentation accompanies the patient and the nursing facility. Of note, patient's medication reconciliation sheet demonstrates he is taking potassium supplementation as well as Aldactone noting the Aldactone was documented as being discontinued at time of last discharge   ED Course:  Vital signs: PO temp 99.2-BP 95/100-pulse 108-respirations 20-room air saturations 95% AAS: No acute abnormalities on abdominal films or chest x-ray Lab data: Sodium 139, potassium 4.0, chloride 98, CO2 29, BUN 51, creatinine 2.46, glucose 336, LFTs normal, CBC 7300 with neutrophils 81% but normal absolute neutrophils, hemoglobin 9.5, platelets 207,000 Medications and treatments: Normal saline bolus 2 L, Compazine 10 mg IV 1 dose,  regular insulin 6 units IV 1 dose, labetalol 10 mg IV 1, Phenergan 12.5 mg IV 1  Review of Systems:  In addition to the HPI above,  No Fever-chills, myalgias or other constitutional symptoms No Headache, changes with Vision or hearing, new weakness, tingling, numbness in any extremity No problems swallowing food or Liquids, indigestion/reflux No Chest pain, Cough or Shortness of Breath, palpitations, orthopnea or DOE No Abdominal pain, melena or hematochezia, no dark tarry stools No dysuria, hematuria or flank pain No new skin rashes, lesions, masses or bruises, No new joints pains-aches No recent weight gain or loss No polyuria, polydypsia or polyphagia,   Past Medical History  Diagnosis Date  . CKD (chronic kidney disease), stage III   . Anemia   . HTN (hypertension)   . Type II diabetes mellitus (HCC)   . DVT (deep venous thrombosis) (HCC)     LLE  . Pneumonia 12/06/2015  . GERD (gastroesophageal reflux disease)   . Migraine     "a few times/month" (12/06/2015)  . Depression     Past Surgical History  Procedure Laterality Date  . No past surgeries      Social History   Social History  . Marital Status: Single    Spouse Name: N/A  . Number of Children: N/A  . Years of Education: N/A   Occupational History  . Not on file.   Social History Main Topics  . Smoking status: Current Every Day Smoker -- 0.25 packs/day for 48 years    Types: Cigarettes  . Smokeless tobacco: Never Used  . Alcohol Use: Yes  . Drug Use: Yes    Special: "Crack" cocaine, Marijuana     Comment: 12/06/2015 "last used crack 3-4 months ago"  . Sexual Activity:  Not on file   Other Topics Concern  . Not on file   Social History Narrative   Lives at [facility], shelter.     Mobility: Rolling walker Work history: Unemployed prior to placement in nursing facility   Allergies  Allergen Reactions  . Latex Rash    Family History  Problem Relation Age of Onset  .  Diabetes Mellitus II Mother   . Diabetes Mellitus II Father   . Stroke Maternal Grandfather      Prior to Admission medications   Medication Sig Start Date End Date Taking? Authorizing Provider  alum & mag hydroxide-simeth (MAALOX/MYLANTA) 200-200-20 MG/5ML suspension Take 10 mLs by mouth every 6 (six) hours as needed for indigestion or heartburn.   Yes Historical Provider, MD  diltiazem (CARTIA XT) 240 MG 24 hr capsule Take 1 capsule (240 mg total) by mouth daily. 01/15/16  Yes Rolly Salter, MD  divalproex (DEPAKOTE SPRINKLE) 125 MG capsule Take 250 mg by mouth 2 (two) times daily.   Yes Historical Provider, MD  ferrous sulfate 325 (65 FE) MG tablet Take 1 tablet (325 mg total) by mouth daily with breakfast. 12/13/15  Yes Rolly Salter, MD  furosemide (LASIX) 80 MG tablet Take 0.5 tablets (40 mg total) by mouth 2 (two) times daily. Patient taking differently: Take 80 mg by mouth daily.  06/12/16  Yes Mauricio Annett Gula, MD  guaiFENesin (MUCINEX) 600 MG 12 hr tablet Take 1 tablet (600 mg total) by mouth 2 (two) times daily as needed. Patient taking differently: Take 600 mg by mouth 2 (two) times daily as needed for cough or to loosen phlegm.  12/24/15  Yes Shanker Levora Dredge, MD  haloperidol (HALDOL) 2 MG tablet Take 2 mg by mouth 2 (two) times daily.   Yes Historical Provider, MD  hydrALAZINE (APRESOLINE) 50 MG tablet Take 1 tablet (50 mg total) by mouth 3 (three) times daily. 01/15/16  Yes Rolly Salter, MD  insulin detemir (LEVEMIR) 100 UNIT/ML injection Inject 0.25 mLs (25 Units total) into the skin daily. 12/24/15  Yes Shanker Levora Dredge, MD  insulin lispro (HUMALOG) 100 UNIT/ML injection Inject 2-10 Units into the skin 3 (three) times daily with meals. Per sliding scale: 201-250= 2 units; 251-300= 4 units; 301-350= 6 units; 351-400= 8 units; if above 401= 10 units, recheck in 1 hours if still above 401 call MD.   Yes Historical Provider, MD  ipratropium-albuterol (DUONEB) 0.5-2.5 (3) MG/3ML  SOLN Take 3 mLs by nebulization every 4 (four) hours as needed. Patient taking differently: Take 3 mLs by nebulization every 4 (four) hours as needed (shortness of breath).  12/24/15  Yes Shanker Levora Dredge, MD  isosorbide mononitrate (IMDUR) 30 MG 24 hr tablet Take 1 tablet (30 mg total) by mouth daily. 01/15/16  Yes Rolly Salter, MD  LORazepam (ATIVAN) 0.5 MG tablet Take 0.5 mg by mouth 2 (two) times daily as needed for anxiety (breakthrough anxiety).   Yes Historical Provider, MD  omeprazole (PRILOSEC) 20 MG capsule Take 20 mg by mouth daily.   Yes Historical Provider, MD  potassium chloride (K-DUR) 10 MEQ tablet Take 20 mEq by mouth daily.   Yes Historical Provider, MD  ranitidine (ZANTAC) 150 MG tablet Take 150 mg by mouth 2 (two) times daily.   Yes Historical Provider, MD  senna (SENOKOT) 8.6 MG tablet Take 2 tablets by mouth at bedtime.   Yes Historical Provider, MD  spironolactone (ALDACTONE) 50 MG tablet Take 50 mg by mouth  daily.   Yes Historical Provider, MD  insulin aspart (NOVOLOG) 100 UNIT/ML injection 0-15 Units, Subcutaneous, 3 times daily with meals CBG < 70: implement hypoglycemia protocol CBG 70 - 120: 0 units CBG 121 - 150: 2 units CBG 151 - 200: 3 units CBG 201 - 250: 5 units CBG 251 - 300: 8 units CBG 301 - 350: 11 units CBG 351 - 400: 15 units CBG > 400: call MD Patient not taking: Reported on 01/28/2016 12/24/15   Shanker Levora Dredge, MD    Physical Exam: Filed Vitals:   06/18/16 1415 06/18/16 1430 06/18/16 1445 06/18/16 1500  BP: 182/94 168/82 162/79 112/70  Pulse: 109 101 98 97  Temp:      TempSrc:      Resp: 18 19 18 18   SpO2: 95% 95% 95% 95%      Constitutional: NAD, Restless and complaining of feeling cold Eyes: PERRL, lids and conjunctivae normal ENMT: Mucous membranes are dry. Posterior pharynx clear of any exudate or lesions. Poor dentition.  Neck: normal, supple, no masses, no thyromegaly Respiratory: clear to auscultation bilaterally, no wheezing, no  crackles. Normal respiratory effort. No accessory muscle use.  Cardiovascular: Regular rate and rhythm, no murmurs / rubs / gallops. No extremity edema. 2+ pedal pulses. No carotid bruits.  Abdomen: no tenderness, no masses palpated. No hepatosplenomegaly. Bowel sounds positive.  Musculoskeletal: no clubbing / cyanosis. No joint deformity upper and lower extremities. Good ROM, no contractures. Normal muscle tone.  Skin: no rashes, lesions, ulcers. No induration-skin cool to touch especially in extremities Neurologic: CN 2-12 grossly intact. Sensation intact, DTR normal. Strength 5/5 x all 4 extremities.  Psychiatric: Normal judgment and insight. Alert and oriented x 3. Normal mood.    Labs on Admission: I have personally reviewed following labs and imaging studies  CBC:  Recent Labs Lab 06/12/16 0427 06/18/16 1139  WBC 4.5 7.3  NEUTROABS  --  5.9  HGB 8.2* 9.5*  HCT 24.7* 29.0*  MCV 90.1 91.8  PLT 152 207   Basic Metabolic Panel:  Recent Labs Lab 06/12/16 0427 06/18/16 1139  NA 134* 139  K 4.5 4.0  CL 105 98*  CO2 22 29  GLUCOSE 194* 336*  BUN 49* 51*  CREATININE 1.87* 2.46*  CALCIUM 8.5* 9.7   GFR: Estimated Creatinine Clearance: 36.9 mL/min (by C-G formula based on Cr of 2.46). Liver Function Tests:  Recent Labs Lab 06/18/16 1139  AST 26  ALT 22  ALKPHOS 57  BILITOT 0.8  PROT 6.9  ALBUMIN 3.5    Recent Labs Lab 06/18/16 1139  LIPASE 15   No results for input(s): AMMONIA in the last 168 hours. Coagulation Profile: No results for input(s): INR, PROTIME in the last 168 hours. Cardiac Enzymes: No results for input(s): CKTOTAL, CKMB, CKMBINDEX, TROPONINI in the last 168 hours. BNP (last 3 results) No results for input(s): PROBNP in the last 8760 hours. HbA1C: No results for input(s): HGBA1C in the last 72 hours. CBG:  Recent Labs Lab 06/11/16 1626 06/11/16 2213 06/12/16 0757 06/12/16 1135  GLUCAP 254* 249* 139* 240*   Lipid Profile: No  results for input(s): CHOL, HDL, LDLCALC, TRIG, CHOLHDL, LDLDIRECT in the last 72 hours. Thyroid Function Tests: No results for input(s): TSH, T4TOTAL, FREET4, T3FREE, THYROIDAB in the last 72 hours. Anemia Panel: No results for input(s): VITAMINB12, FOLATE, FERRITIN, TIBC, IRON, RETICCTPCT in the last 72 hours. Urine analysis:    Component Value Date/Time   COLORURINE YELLOW 06/09/2016 2335   APPEARANCEUR  CLEAR 06/09/2016 2335   LABSPEC 1.016 06/09/2016 2335   PHURINE 5.0 06/09/2016 2335   GLUCOSEU 250* 06/09/2016 2335   HGBUR NEGATIVE 06/09/2016 2335   BILIRUBINUR NEGATIVE 06/09/2016 2335   KETONESUR NEGATIVE 06/09/2016 2335   PROTEINUR 100* 06/09/2016 2335   UROBILINOGEN 0.2 10/08/2015 0634   NITRITE NEGATIVE 06/09/2016 2335   LEUKOCYTESUR NEGATIVE 06/09/2016 2335   Sepsis Labs: @LABRCNTIP (procalcitonin:4,lacticidven:4) ) Recent Results (from the past 240 hour(s))  MRSA PCR Screening     Status: [name]   Collection Time: 06/09/16  7:15 PM  Result Value Ref Range Status   MRSA by PCR NEGATIVE NEGATIVE Final    Comment:        The GeneXpert MRSA Assay (FDA approved for NASAL specimens only), is one component of a comprehensive MRSA colonization surveillance program. It is not intended to diagnose MRSA infection nor to guide or monitor treatment for MRSA infections.      Radiological Exams on Admission: Dg Abd Acute W/chest  06/18/2016  CLINICAL DATA:  Abdominal pain, vomiting and weakness for 2 weeks. EXAM: DG ABDOMEN ACUTE W/ 1V CHEST COMPARISON:  PA and lateral chest 01/28/2016. CT abdomen and pelvis 01/10/2016. FINDINGS: Single-view of the chest demonstrates clear lungs. No pneumothorax or pleural effusion. Heart size is upper normal. Two views of the abdomen show no free intraperitoneal air. The bowel gas pattern is normal. No unexpected abdominal calcification is seen. No focal bony abnormality is identified. IMPRESSION: Negative exam. Electronically Signed   By:  Drusilla Kanner M.D.   On: 06/18/2016 13:36    EKG: (Independently reviewed) Sinus tachycardia with chronic right bundle-branch block, QTC 512 ms  Assessment/Plan Principal Problem:   Acute kidney injury superimposed on chronic kidney disease  -Patient presents with nausea and vomiting and associated find depletion and now has acute kidney injury -Patient apparently was continued on spironolactone by nursing facility after discharge; he was also taking Lasix with potassium -Hold Lasix-I have discontinued spironolactone and potassium while performing medication reconciliation -IV fluid hydration; he has received 2 L of fluid via bolus in the ER -Follow electrolytes -Baseline renal function: 49/1.87 -Current renal function 51/2.46  Active Problems:   Type 2 diabetes mellitus with hyperosmolar nonketotic hyperglycemia  -Anion gap normal so not in DKA -Patient has a history of refusing medications and injections in the past and unclear patient received Levemir this morning -Check CBGs every 4 hours and provide SSI -I have reordered preadmission Levemir    Dehydration/ Nausea & vomiting -Patient presents with nausea and vomiting that could be related to infectious process (viral gastroenteritis) vs functional (diabetic gastroparesis) vs metabolic (AKI) -Treat underlying causes -Hydration as above -Cautious use of anti-emetics in setting of chronic prolonged QT interval especially since patient needs to continue to take Haldol if can tolerate by mouth -Trial clear liquids -No diarrhea reported but if has stool checked gastrointestinal PCR panel    Accelerated hypertension -Likely related to an ability to keep down medications secondary to nausea and vomiting -I have ordered scheduled IV hydralazine and IV Lopressor prn;  blood pressure decreased to 112/70 after Labetelol given by EDP    Anemia of chronic disease -Hgb and stable and around baseline of 9.9    Chronic diastolic heart  failure, NYHA class 1  -Appears compensated given the fact the patient is volume depleted -During previous admission patient's Lasix dosage was decreased from 80 mg twice a day to 40 mg twice a day; spironolactone was to have been discontinued as was potassium  but these were continued after presenting to nursing facility -Current Lasix on hold and as documented above spironolactone and potassium have been discontinued -Patient now residing at nursing facility with appropriate diet and monitoring so may benefit from prn Lasix based on weight gain as opposed to scheduled Lasix    History of Polysubstance abuse -Currently residing at skilled nursing facility and no indication to suspect he has been abusing substances    Prolonged Q-T interval on ECG -Chronic problem in setting of right bundle-branch block -Has been tolerating Haldol long-term -Cautious use of medications that can prolong QT such as fluoroquinolones and anti-emetics    Schizophrenia  -Continue Haldol by mouth if patient can tolerate with sip of fluids      DVT prophylaxis: Lovenox  Code Status: Full code  Family Communication: No family bedside at time of admission  Disposition Plan: Anticipate discharge back to skilled nursing facility once medically stable [facility]) Consults called: [name] Admission status: Observation/telemetry    ELLIS,ALLISON L. ANP-BC [facility] Pager [phone]   If 7PM-7AM, please contact night-coverage [email or web address] Password [facility]  06/18/2016, 3:33 PM

## 2016-06-18 NOTE — ED Notes (Signed)
Attempted to call report x 1  

## 2016-06-18 NOTE — Progress Notes (Signed)
Called and tried to get report from ED x 2

## 2016-06-18 NOTE — ED Provider Notes (Signed)
CSN: [id number]     Arrival date & time 06/18/16  1054 History   First MD Initiated Contact with Patient 06/18/16 1058     Chief Complaint  Patient presents with  . Emesis   HPI Patient presents to the emergency room for evaluation of nausea and vomiting.  Patient is a resident of a nursing home. He has been having recurrent episodes of nausea and vomiting. Patient states symptoms started yesterday. He has had multiple episodes of vomiting. He denies any trouble with chest pain. He denies any abdominal pain. He denies any shortness of breath. Patient has not been able to take his medications because of the vomiting.  Patient has a history of recurrent episodes of nausea and vomiting as well as hyperglycemia. He was last seen in the emergency room on July 2. Patient was noted to be in acute renal failure. He was started on IV fluids and admitted to the hospital. Past Medical History  Diagnosis Date  . CKD (chronic kidney disease), stage III   . Anemia   . HTN (hypertension)   . Type II diabetes mellitus (HCC)   . DVT (deep venous thrombosis) (HCC)     LLE  . Pneumonia 12/06/2015  . GERD (gastroesophageal reflux disease)   . Migraine     "a few times/month" (12/06/2015)  . Depression    Past Surgical History  Procedure Laterality Date  . No past surgeries     Family History  Problem Relation Age of Onset  . Diabetes Mellitus II Mother   . Diabetes Mellitus II Father   . Stroke Maternal Grandfather    Social History  Substance Use Topics  . Smoking status: Current Every Day Smoker -- 0.25 packs/day for 48 years    Types: Cigarettes  . Smokeless tobacco: Never Used  . Alcohol Use: Yes    Review of Systems  All other systems reviewed and are negative.     Allergies  Latex  Home Medications   Prior to Admission medications   Medication Sig Start Date End Date Taking? Authorizing Provider  alum & mag hydroxide-simeth (MAALOX/MYLANTA) 200-200-20 MG/5ML suspension Take  10 mLs by mouth every 6 (six) hours as needed for indigestion or heartburn.   Yes Historical Provider, MD  diltiazem (CARTIA XT) 240 MG 24 hr capsule Take 1 capsule (240 mg total) by mouth daily. 01/15/16  Yes Rolly Salter, MD  divalproex (DEPAKOTE SPRINKLE) 125 MG capsule Take 250 mg by mouth 2 (two) times daily.   Yes Historical Provider, MD  ferrous sulfate 325 (65 FE) MG tablet Take 1 tablet (325 mg total) by mouth daily with breakfast. 12/13/15  Yes Rolly Salter, MD  furosemide (LASIX) 80 MG tablet Take 0.5 tablets (40 mg total) by mouth 2 (two) times daily. Patient taking differently: Take 80 mg by mouth daily.  06/12/16  Yes Mauricio Annett Gula, MD  guaiFENesin (MUCINEX) 600 MG 12 hr tablet Take 1 tablet (600 mg total) by mouth 2 (two) times daily as needed. Patient taking differently: Take 600 mg by mouth 2 (two) times daily as needed for cough or to loosen phlegm.  12/24/15  Yes Shanker Levora Dredge, MD  haloperidol (HALDOL) 2 MG tablet Take 2 mg by mouth 2 (two) times daily.   Yes Historical Provider, MD  hydrALAZINE (APRESOLINE) 50 MG tablet Take 1 tablet (50 mg total) by mouth 3 (three) times daily. 01/15/16  Yes Rolly Salter, MD  insulin detemir (LEVEMIR) 100 UNIT/ML injection Inject 0.25  mLs (25 Units total) into the skin daily. 12/24/15  Yes Shanker Levora Dredge, MD  insulin lispro (HUMALOG) 100 UNIT/ML injection Inject 2-10 Units into the skin 3 (three) times daily with meals. Per sliding scale: 201-250= 2 units; 251-300= 4 units; 301-350= 6 units; 351-400= 8 units; if above 401= 10 units, recheck in 1 hours if still above 401 call MD.   Yes Historical Provider, MD  ipratropium-albuterol (DUONEB) 0.5-2.5 (3) MG/3ML SOLN Take 3 mLs by nebulization every 4 (four) hours as needed. Patient taking differently: Take 3 mLs by nebulization every 4 (four) hours as needed (shortness of breath).  12/24/15  Yes Shanker Levora Dredge, MD  isosorbide mononitrate (IMDUR) 30 MG 24 hr tablet Take 1 tablet (30 mg  total) by mouth daily. 01/15/16  Yes Rolly Salter, MD  LORazepam (ATIVAN) 0.5 MG tablet Take 0.5 mg by mouth 2 (two) times daily as needed for anxiety (breakthrough anxiety).   Yes Historical Provider, MD  omeprazole (PRILOSEC) 20 MG capsule Take 20 mg by mouth daily.   Yes Historical Provider, MD  potassium chloride (K-DUR) 10 MEQ tablet Take 20 mEq by mouth daily.   Yes Historical Provider, MD  ranitidine (ZANTAC) 150 MG tablet Take 150 mg by mouth 2 (two) times daily.   Yes Historical Provider, MD  senna (SENOKOT) 8.6 MG tablet Take 2 tablets by mouth at bedtime.   Yes Historical Provider, MD  spironolactone (ALDACTONE) 50 MG tablet Take 50 mg by mouth daily.   Yes Historical Provider, MD  insulin aspart (NOVOLOG) 100 UNIT/ML injection 0-15 Units, Subcutaneous, 3 times daily with meals CBG < 70: implement hypoglycemia protocol CBG 70 - 120: 0 units CBG 121 - 150: 2 units CBG 151 - 200: 3 units CBG 201 - 250: 5 units CBG 251 - 300: 8 units CBG 301 - 350: 11 units CBG 351 - 400: 15 units CBG > 400: call MD Patient not taking: Reported on 01/28/2016 12/24/15   Shanker Levora Dredge, MD   BP 196/103 mmHg  Pulse 108  Temp(Src) 99.2 F (37.3 C) (Oral)  Resp 23  SpO2 96% Physical Exam  Constitutional: No distress.  HENT:  Head: Normocephalic and atraumatic.  Right Ear: External ear normal.  Left Ear: External ear normal.  Eyes: Conjunctivae are normal. Right eye exhibits no discharge. Left eye exhibits no discharge. No scleral icterus.  Neck: Neck supple. No tracheal deviation present.  Cardiovascular: Normal rate, regular rhythm and intact distal pulses.   Pulmonary/Chest: Effort normal and breath sounds normal. No stridor. No respiratory distress. He has no wheezes. He has no rales.  Abdominal: Soft. Bowel sounds are normal. He exhibits no distension. There is no tenderness. There is no rebound and no guarding.  Musculoskeletal: He exhibits no edema or tenderness.  Neurological: He is  alert. He displays tremor. No cranial nerve deficit (no facial droop, extraocular movements intact, no slurred speech) or sensory deficit. He exhibits normal muscle tone. He displays no seizure activity. Coordination normal.  Generalized weakness, no focal deficits  Skin: Skin is warm and dry. No rash noted. He is not diaphoretic.  Psychiatric: He has a normal mood and affect.  Nursing note and vitals reviewed.   ED Course  Procedures (including critical care time) Labs Review Labs Reviewed  COMPREHENSIVE METABOLIC PANEL - Abnormal; Notable for the following:    Chloride 98 (*)    Glucose, Bld 336 (*)    BUN 51 (*)    Creatinine, Ser 2.46 (*)  GFR calc non Af Amer 26 (*)    GFR calc Af Amer 31 (*)    All other components within normal limits  CBC WITH DIFFERENTIAL/PLATELET - Abnormal; Notable for the following:    RBC 3.16 (*)    Hemoglobin 9.5 (*)    HCT 29.0 (*)    All other components within normal limits  I-STAT VENOUS BLOOD GAS, ED - Abnormal; Notable for the following:    pH, Ven 7.507 (*)    pO2, Ven 52.0 (*)    Bicarbonate 36.6 (*)    Acid-Base Excess 11.0 (*)    All other components within normal limits  LIPASE, BLOOD  URINALYSIS, ROUTINE W REFLEX MICROSCOPIC (NOT AT [facility])  MAGNESIUM    Imaging Review Dg Abd Acute W/chest  06/18/2016  CLINICAL DATA:  Abdominal pain, vomiting and weakness for 2 weeks. EXAM: DG ABDOMEN ACUTE W/ 1V CHEST COMPARISON:  PA and lateral chest 01/28/2016. CT abdomen and pelvis 01/10/2016. FINDINGS: Single-view of the chest demonstrates clear lungs. No pneumothorax or pleural effusion. Heart size is upper normal. Two views of the abdomen show no free intraperitoneal air. The bowel gas pattern is normal. No unexpected abdominal calcification is seen. No focal bony abnormality is identified. IMPRESSION: Negative exam. Electronically Signed   By: Drusilla Kannerhomas  Dalessio M.D.   On: 06/18/2016 13:36   I have personally reviewed and evaluated these images  and lab results as part of my medical decision-making.   EKG Interpretation   Date/Time:  Tuesday June 18 2016 11:06:04 EDT Ventricular Rate:  106 PR Interval:    QRS Duration: 155 QT Interval:  385 QTC Calculation: 512 R Axis:   95 Text Interpretation:  sinus tachycardia Right bundle branch block Artifact  in lead(s) I II III aVR aVL aVF V1 V2 No significant change since last  tracing Confirmed by Maziah Smola  MD-J, Shizuo Biskup ([id number]) on 06/18/2016 11:14:31 AM      MDM   Final diagnoses:  Hyperglycemia  Intractable vomiting with nausea, vomiting of unspecified type  Acute renal insufficiency  Essential hypertension    The patient presented to the emergency room with recurrent episodes of nausea and vomiting. Patient has had several episodes of vomiting in the emergency room. He continues to feel nauseated despite Compazine. I will give phenergan.  We will need to monitor his qt interval.  Potassium is therapeutic.  I will order a magnesium level.   the patient's laboratory tests show worsening renal insufficiency associated with hyperglycemia. He has been treated with IV fluids. I ordered a dose of labetalol for his elevated blood pressure.  No obstruction on xray.  He denies pain.  I doubt acute infection.  I'll consult the medical service for admission concerning his persistent symptoms    Linwood DibblesJon Mahsa Hanser, MD 06/18/16 1406

## 2016-06-18 NOTE — ED Notes (Signed)
Pt refused rectal temp.

## 2016-06-19 ENCOUNTER — Inpatient Hospital Stay (HOSPITAL_COMMUNITY): Payer: Medicaid Other

## 2016-06-19 DIAGNOSIS — E1100 Type 2 diabetes mellitus with hyperosmolarity without nonketotic hyperglycemic-hyperosmolar coma (NKHHC): Secondary | ICD-10-CM | POA: Diagnosis present

## 2016-06-19 DIAGNOSIS — N184 Chronic kidney disease, stage 4 (severe): Secondary | ICD-10-CM | POA: Diagnosis present

## 2016-06-19 DIAGNOSIS — D638 Anemia in other chronic diseases classified elsewhere: Secondary | ICD-10-CM | POA: Diagnosis present

## 2016-06-19 DIAGNOSIS — N189 Chronic kidney disease, unspecified: Secondary | ICD-10-CM | POA: Diagnosis not present

## 2016-06-19 DIAGNOSIS — I5032 Chronic diastolic (congestive) heart failure: Secondary | ICD-10-CM | POA: Diagnosis present

## 2016-06-19 DIAGNOSIS — Z91040 Latex allergy status: Secondary | ICD-10-CM | POA: Diagnosis not present

## 2016-06-19 DIAGNOSIS — Z79899 Other long term (current) drug therapy: Secondary | ICD-10-CM | POA: Diagnosis not present

## 2016-06-19 DIAGNOSIS — I10 Essential (primary) hypertension: Secondary | ICD-10-CM | POA: Diagnosis not present

## 2016-06-19 DIAGNOSIS — Z823 Family history of stroke: Secondary | ICD-10-CM | POA: Diagnosis not present

## 2016-06-19 DIAGNOSIS — E1122 Type 2 diabetes mellitus with diabetic chronic kidney disease: Secondary | ICD-10-CM | POA: Diagnosis present

## 2016-06-19 DIAGNOSIS — N179 Acute kidney failure, unspecified: Secondary | ICD-10-CM | POA: Diagnosis not present

## 2016-06-19 DIAGNOSIS — F209 Schizophrenia, unspecified: Secondary | ICD-10-CM | POA: Diagnosis present

## 2016-06-19 DIAGNOSIS — N289 Disorder of kidney and ureter, unspecified: Secondary | ICD-10-CM | POA: Diagnosis not present

## 2016-06-19 DIAGNOSIS — K219 Gastro-esophageal reflux disease without esophagitis: Secondary | ICD-10-CM | POA: Diagnosis present

## 2016-06-19 DIAGNOSIS — E860 Dehydration: Secondary | ICD-10-CM | POA: Diagnosis present

## 2016-06-19 DIAGNOSIS — I4510 Unspecified right bundle-branch block: Secondary | ICD-10-CM | POA: Diagnosis present

## 2016-06-19 DIAGNOSIS — R112 Nausea with vomiting, unspecified: Secondary | ICD-10-CM | POA: Diagnosis not present

## 2016-06-19 DIAGNOSIS — Z9114 Patient's other noncompliance with medication regimen: Secondary | ICD-10-CM | POA: Diagnosis not present

## 2016-06-19 DIAGNOSIS — Z794 Long term (current) use of insulin: Secondary | ICD-10-CM | POA: Diagnosis not present

## 2016-06-19 DIAGNOSIS — Z833 Family history of diabetes mellitus: Secondary | ICD-10-CM | POA: Diagnosis not present

## 2016-06-19 DIAGNOSIS — F329 Major depressive disorder, single episode, unspecified: Secondary | ICD-10-CM | POA: Diagnosis present

## 2016-06-19 DIAGNOSIS — E869 Volume depletion, unspecified: Secondary | ICD-10-CM | POA: Diagnosis not present

## 2016-06-19 DIAGNOSIS — I130 Hypertensive heart and chronic kidney disease with heart failure and stage 1 through stage 4 chronic kidney disease, or unspecified chronic kidney disease: Secondary | ICD-10-CM | POA: Diagnosis present

## 2016-06-19 LAB — COMPREHENSIVE METABOLIC PANEL
ALK PHOS: 48 U/L (ref 38–126)
ALT: 18 U/L (ref 17–63)
ANION GAP: 7 (ref 5–15)
AST: 25 U/L (ref 15–41)
Albumin: 3.1 g/dL — ABNORMAL LOW (ref 3.5–5.0)
BILIRUBIN TOTAL: 0.6 mg/dL (ref 0.3–1.2)
BUN: 51 mg/dL — ABNORMAL HIGH (ref 6–20)
CALCIUM: 9 mg/dL (ref 8.9–10.3)
CO2: 32 mmol/L (ref 22–32)
Chloride: 106 mmol/L (ref 101–111)
Creatinine, Ser: 2.38 mg/dL — ABNORMAL HIGH (ref 0.61–1.24)
GFR calc non Af Amer: 28 mL/min — ABNORMAL LOW (ref >60)
GFR, EST AFRICAN AMERICAN: 32 mL/min — AB (ref >60)
Glucose, Bld: 119 mg/dL — ABNORMAL HIGH (ref 65–99)
POTASSIUM: 3.6 mmol/L (ref 3.5–5.1)
SODIUM: 145 mmol/L (ref 135–145)
TOTAL PROTEIN: 6.2 g/dL — AB (ref 6.5–8.1)

## 2016-06-19 LAB — GLUCOSE, CAPILLARY
GLUCOSE-CAPILLARY: 118 mg/dL — AB (ref 65–99)
GLUCOSE-CAPILLARY: 125 mg/dL — AB (ref 65–99)
GLUCOSE-CAPILLARY: 170 mg/dL — AB (ref 65–99)
GLUCOSE-CAPILLARY: 99 mg/dL (ref 65–99)
Glucose-Capillary: 110 mg/dL — ABNORMAL HIGH (ref 65–99)
Glucose-Capillary: 167 mg/dL — ABNORMAL HIGH (ref 65–99)

## 2016-06-19 LAB — CBC
HCT: 27.4 % — ABNORMAL LOW (ref 39.0–52.0)
HEMOGLOBIN: 8.6 g/dL — AB (ref 13.0–17.0)
MCH: 29.5 pg (ref 26.0–34.0)
MCHC: 31.4 g/dL (ref 30.0–36.0)
MCV: 93.8 fL (ref 78.0–100.0)
Platelets: 217 10*3/uL (ref 150–400)
RBC: 2.92 MIL/uL — AB (ref 4.22–5.81)
RDW: 14.5 % (ref 11.5–15.5)
WBC: 8 10*3/uL (ref 4.0–10.5)

## 2016-06-19 MED ORDER — ISOSORBIDE MONONITRATE ER 30 MG PO TB24
30.0000 mg | ORAL_TABLET | Freq: Every day | ORAL | Status: DC
  Administered 2016-06-19 – 2016-06-21 (×3): 30 mg via ORAL
  Filled 2016-06-19 (×3): qty 1

## 2016-06-19 MED ORDER — DILTIAZEM HCL ER COATED BEADS 120 MG PO CP24
240.0000 mg | ORAL_CAPSULE | Freq: Every day | ORAL | Status: DC
  Administered 2016-06-19 – 2016-06-21 (×3): 240 mg via ORAL
  Filled 2016-06-19 (×3): qty 2

## 2016-06-19 NOTE — NC FL2 (Signed)
[place] MEDICAID FL2 LEVEL OF CARE SCREENING TOOL     IDENTIFICATION  Patient Name: Clayton [name] Birthdate: Apr 19, 1951 Sex: male Admission Date (Current Location): 06/18/2016  [place] and [place] Number:  [address] Bast [id number] N Facility and Address:  [facility], [address] [place], [place] [address]      Provider Number: [id number]  Attending Physician Name and Address:  Maretta Bees, MD  Relative Name and Phone Number:  Delton See Johnson-friend-[phone]; Cletis Bronson-[phone]; Ben Brown-[phone]    Current Level of Care: Hospital Recommended Level of Care: Nursing Facility (Patient from [facility]) Prior Approval Number:    Date Approved/Denied:   PASRR Number: [id number]  Discharge Plan: SNF (Patient from [facility])    Current Diagnoses: Patient Active Problem List   Diagnosis Date Noted  . Dehydration 06/18/2016  . Nausea & vomiting 06/18/2016  . Schizophrenia (HCC) 06/18/2016  . Chronic diastolic heart failure, NYHA class 1 (HCC) 06/18/2016  . Acute kidney injury (HCC) 06/18/2016  . Essential hypertension   . AKI (acute kidney injury) (HCC) 06/09/2016  . Near syncope 06/09/2016  . Acute pulmonary edema (HCC) 01/09/2016  . Type 2 diabetes mellitus with hyperosmolar nonketotic hyperglycemia (HCC) 12/16/2015  . Prolonged Q-T interval on ECG 12/16/2015  . CKD (chronic kidney disease) 12/11/2015  . Diabetes mellitus with complication (HCC) 12/11/2015  . Homeless 12/11/2015  . Anemia of chronic disease 12/11/2015  . Abnormal chest x-ray 12/06/2015  . Hyperglycemia 12/04/2015  . Hypertensive urgency 12/04/2015  . Cocaine abuse 10/08/2015  . Acute kidney injury superimposed on chronic kidney disease (HCC) 08/17/2015  . Accelerated hypertension   . History of Polysubstance abuse 12/13/2011  . Hyponatremia 12/01/2011    Orientation RESPIRATION BLADDER Height & Weight     Self, Place  Normal  Incontinent (Urinary catheter placed 06/19/16) Weight: 185 lb 10 oz (84.2 kg) Height:   (177.8 cm)  BEHAVIORAL SYMPTOMS/MOOD NEUROLOGICAL BOWEL NUTRITION STATUS      Incontinent  (Heart healthy-carb modified)  AMBULATORY STATUS COMMUNICATION OF NEEDS Skin   Limited Assist Verbally Normal                       Personal Care Assistance Level of Assistance    Bathing Assistance: Limited assistance Feeding assistance: Independent Dressing Assistance: Limited assistance     Functional Limitations Info  Sight, Hearing, Speech Sight Info: Impaired Hearing Info: Adequate Speech Info: Adequate    SPECIAL CARE FACTORS FREQUENCY                       Contractures Contractures Info: Not present    Additional Factors Info  Code Status, Allergies, Insulin Sliding Scale, Isolation Precautions Code Status Info: Full Allergies Info: Latex   Insulin Sliding Scale Info: Insulin 0-15 Units every 4 hours Isolation Precautions Info: MRSA     Current Medications (06/19/2016):  This is the current hospital active medication list Current Facility-Administered Medications  Medication Dose Route Frequency Provider Last Rate Last Dose  . 0.9 %  sodium chloride infusion   Intravenous Continuous Linwood Dibbles, MD 125 mL/hr at 06/19/16 0934    . acetaminophen (TYLENOL) tablet 650 mg  650 mg Oral Q6H PRN Russella Dar, NP   650 mg at 06/18/16 2030   Or  . acetaminophen (TYLENOL) suppository 650 mg  650 mg Rectal Q6H PRN Russella Dar, NP      . divalproex (DEPAKOTE SPRINKLE) capsule 250 mg  250  mg Oral BID Russella DarAllison L Ellis, NP   250 mg at 06/19/16 [id number]  . enoxaparin (LOVENOX) injection 40 mg  40 mg Subcutaneous Q24H Russella DarAllison L Ellis, NP   40 mg at 06/18/16 1802  . ferrous sulfate tablet 325 mg  325 mg Oral Q breakfast Russella DarAllison L Ellis, NP   325 mg at 06/19/16 [id number]  . folic acid injection 1 mg  1 mg Intravenous Daily Russella DarAllison L Ellis, NP   1 mg at 06/19/16 [id number]  . haloperidol (HALDOL)  tablet 2 mg  2 mg Oral BID Russella DarAllison L Ellis, NP   2 mg at 06/19/16 [id number]  . hydrALAZINE (APRESOLINE) injection 5 mg  5 mg Intravenous Q6H PRN Russella DarAllison L Ellis, NP   5 mg at 06/18/16 1834  . insulin aspart (novoLOG) injection 0-15 Units  0-15 Units Subcutaneous Q4H Russella DarAllison L Ellis, NP   3 Units at 06/19/16 1237  . insulin detemir (LEVEMIR) injection 25 Units  25 Units Subcutaneous Daily Russella DarAllison L Ellis, NP   25 Units at 06/19/16 0929  . ipratropium-albuterol (DUONEB) 0.5-2.5 (3) MG/3ML nebulizer solution 3 mL  3 mL Nebulization Q4H PRN Russella DarAllison L Ellis, NP      . LORazepam (ATIVAN) tablet 0.5 mg  0.5 mg Oral BID PRN Russella DarAllison L Ellis, NP   0.5 mg at 06/18/16 2030  . metoprolol (LOPRESSOR) injection 2.5 mg  2.5 mg Intravenous Q6H PRN Russella DarAllison L Ellis, NP   2.5 mg at 06/18/16 1742  . prochlorperazine (COMPAZINE) injection 10 mg  10 mg Intravenous Q4H PRN Russella DarAllison L Ellis, NP   10 mg at 06/18/16 2330  . senna (SENOKOT) tablet 17.2 mg  2 tablet Oral QHS Russella DarAllison L Ellis, NP   17.2 mg at 06/18/16 2300  . sodium chloride flush (NS) 0.9 % injection 3 mL  3 mL Intravenous Q12H Russella DarAllison L Ellis, NP   3 mL at 06/19/16 0929  . thiamine (B-1) injection 100 mg  100 mg Intravenous Daily Russella DarAllison L Ellis, NP   100 mg at 06/19/16 [id number]     Discharge Medications: Please see discharge summary for a list of discharge medications.  Relevant Imaging Results:  Relevant Lab Results:   Additional Information    Okey DupreCrawford, Lazaro ArmsVanessa Bradley, LCSW

## 2016-06-19 NOTE — Progress Notes (Signed)
   06/19/16 1122  Clinical Encounter Type  Visited With Patient  Visit Type Initial  Referral From Nurse  Consult/Referral To Chaplain  Spiritual Encounters  Spiritual Needs Prayer;Emotional  Stress Factors  Patient Stress Factors Exhausted;Health changes;Lack of knowledge  Chaplain rounding visit made, provided emotional support, spiritual presence, scripture reading and prayer.  Advised that further support services are available upon request 24/7.

## 2016-06-19 NOTE — Progress Notes (Addendum)
PROGRESS NOTE        PATIENT DETAILS Name: Clayton [name] Age: 65 y.o. Sex: male Date of Birth: 28-Jun-1951 Admit Date: 06/18/2016 Admitting Physician Ozella Rocksavid J Merrell, MD ZOX:[id number]:PROVIDER NOT IN SYSTEM   Brief Narrative: Patient is a 65 y.o. male with past medical history of hypertension, schizophrenia, currently residing in a skilled nursing facility admitted with nausea and vomiting, found to have acute on chronic kidney failure. Admitted for further evaluation and treatment  Subjective: No further vomiting since last night, tolerating clear liquids well.  Assessment/Plan: Principal Problem: Acute kidney injury superimposed on chronic kidney disease: Acute kidney injury likely secondary to prerenal azotemia-in a setting of vomiting and continued use of diuretics. Creatinine downtrending with the fluids and other supportive measures. Check renal ultrasound-although no bladder felt on exam.  Active Problems: Nausea with vomiting:? Etiology-viral gastroenteritis. Seems to have resolved, tolerating advancement in diet. Abdomen not distended and very benign on exam. Abdominal x-ray and lipase levels were normal on 06/18/16. Plan is to continue to provide supportive care and advance diet as tolerated.  Uncontrolled type 2 diabetes: CBGs much better this morning, continue 25 units of Levemir and SSI. Follow.  Hypertension: Better controlled than on admission-however still elevated-we will resume Cardizem and Imdur. Follow and adjust accordingly.  Anemia of chronic disease: Hemoglobin relatively stable, continue periodic monitoring. No evidence of overt blood loss.  Chronic diastolic heart failure: Euvolemic on exam, diuretics on hold.Agree with not resume Aldactone on discharge.  History of schizophrenia: Appears stable currently-continue with Haldol, Depakote. Follow.  History of polysubstance abuse: Residing in a skilled nursing facility prior to this admission-no  indication of ongoing substance abuse currently.  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   DVT Prophylaxis: Prophylactic Lovenox   Code Status: Full code  Family Communication: None at bedside  Disposition Plan: Remain inpatient-SNF on discharge 7/13  Antimicrobial agents: None  Procedures: None  CONSULTS:  None  Time spent: 25 minutes-Greater than 50% of this time was spent in counseling, explanation of diagnosis, planning of further management, and coordination of care.  MEDICATIONS: Anti-infectives    None      Scheduled Meds: . divalproex  250 mg Oral BID  . enoxaparin (LOVENOX) injection  40 mg Subcutaneous Q24H  . ferrous sulfate  325 mg Oral Q breakfast  . folic acid  1 mg Intravenous Daily  . haloperidol  2 mg Oral BID  . insulin aspart  0-15 Units Subcutaneous Q4H  . insulin detemir  25 Units Subcutaneous Daily  . senna  2 tablet Oral QHS  . sodium chloride flush  3 mL Intravenous Q12H  . thiamine  100 mg Intravenous Daily   Continuous Infusions: . sodium chloride 125 mL/hr at 06/19/16 0934   PRN Meds:.acetaminophen **OR** acetaminophen, hydrALAZINE, ipratropium-albuterol, LORazepam, metoprolol, prochlorperazine   PHYSICAL EXAM: Vital signs: Filed Vitals:   06/18/16 1627 06/18/16 2024 06/19/16 0406 06/19/16 0827  BP: 199/95 155/77 173/95 162/89  Pulse: 104 99 99 102  Temp: 99 F (37.2 C) 98.3 F (36.8 C) 97.9 F (36.6 C) 98 F (36.7 C)  TempSrc: Oral Oral Oral Oral  Resp: 18 19 20 20   Height: 5\' 10"  (1.778 m)     Weight: 84.142 kg (185 lb 8 oz) 84.2 kg (185 lb 10 oz)    SpO2: 97% 94% 95% 95%  Filed Weights   06/18/16 1627 06/18/16 2024  Weight: 84.142 kg (185 lb 8 oz) 84.2 kg (185 lb 10 oz)   Body mass index is 26.63 kg/(m^2).   Gen Exam: Awake and alert with clear speech. Not in any distress  Neck: Supple, No JVD.   Chest: B/L Clear.   CVS: S1 S2 Regular, no murmurs.  Abdomen: soft, BS  +, non tender, non distended.  Extremities: no edema, lower extremities warm to touch. Neurologic: Non Focal.   Skin: No Rash or lesions   Wounds: N/A.   LABORATORY DATA: CBC:  Recent Labs Lab 06/18/16 1139 06/19/16 0409  WBC 7.3 8.0  NEUTROABS 5.9  --   HGB 9.5* 8.6*  HCT 29.0* 27.4*  MCV 91.8 93.8  PLT 207 217    Basic Metabolic Panel:  Recent Labs Lab 06/18/16 1139 06/19/16 0409  NA 139 145  K 4.0 3.6  CL 98* 106  CO2 29 32  GLUCOSE 336* 119*  BUN 51* 51*  CREATININE 2.46* 2.38*  CALCIUM 9.7 9.0  MG 1.9  --     GFR: Estimated Creatinine Clearance: 33.2 mL/min (by C-G formula based on Cr of 2.38).  Liver Function Tests:  Recent Labs Lab 06/18/16 1139 06/19/16 0409  AST 26 25  ALT 22 18  ALKPHOS 57 48  BILITOT 0.8 0.6  PROT 6.9 6.2*  ALBUMIN 3.5 3.1*    Recent Labs Lab 06/18/16 1139  LIPASE 15   No results for input(s): AMMONIA in the last 168 hours.  Coagulation Profile: No results for input(s): INR, PROTIME in the last 168 hours.  Cardiac Enzymes: No results for input(s): CKTOTAL, CKMB, CKMBINDEX, TROPONINI in the last 168 hours.  BNP (last 3 results) No results for input(s): PROBNP in the last 8760 hours.  HbA1C: No results for input(s): HGBA1C in the last 72 hours.  CBG:  Recent Labs Lab 06/18/16 2015 06/19/16 06/19/16 0352 06/19/16 0751 06/19/16 1153  GLUCAP 246* 170* 125* 110* 167*    Lipid Profile: No results for input(s): CHOL, HDL, LDLCALC, TRIG, CHOLHDL, LDLDIRECT in the last 72 hours.  Thyroid Function Tests: No results for input(s): TSH, T4TOTAL, FREET4, T3FREE, THYROIDAB in the last 72 hours.  Anemia Panel: No results for input(s): VITAMINB12, FOLATE, FERRITIN, TIBC, IRON, RETICCTPCT in the last 72 hours.  Urine analysis:    Component Value Date/Time   COLORURINE YELLOW 06/09/2016 2335   APPEARANCEUR CLEAR 06/09/2016 2335   LABSPEC 1.016 06/09/2016 2335   PHURINE 5.0 06/09/2016 2335   GLUCOSEU 250*  06/09/2016 2335   HGBUR NEGATIVE 06/09/2016 2335   BILIRUBINUR NEGATIVE 06/09/2016 2335   KETONESUR NEGATIVE 06/09/2016 2335   PROTEINUR 100* 06/09/2016 2335   UROBILINOGEN 0.2 10/08/2015 0634   NITRITE NEGATIVE 06/09/2016 2335   LEUKOCYTESUR NEGATIVE 06/09/2016 2335    Sepsis Labs: Lactic Acid, Venous    Component Value Date/Time   LATICACIDVEN 0.86 06/09/2016 1412    MICROBIOLOGY: Recent Results (from the past 240 hour(s))  MRSA PCR Screening     Status: None   Collection Time: 06/09/16  7:15 PM  Result Value Ref Range Status   MRSA by PCR NEGATIVE NEGATIVE Final    Comment:        The GeneXpert MRSA Assay (FDA approved for NASAL specimens only), is one component of a comprehensive MRSA colonization surveillance program. It is not intended to diagnose MRSA infection nor to guide or monitor treatment for MRSA infections.     RADIOLOGY STUDIES/RESULTS: Dg Abd Acute W/chest  06/18/2016  CLINICAL DATA:  Abdominal pain, vomiting and weakness for 2 weeks. EXAM: DG ABDOMEN ACUTE W/ 1V CHEST COMPARISON:  PA and lateral chest 01/28/2016. CT abdomen and pelvis 01/10/2016. FINDINGS: Single-view of the chest demonstrates clear lungs. No pneumothorax or pleural effusion. Heart size is upper normal. Two views of the abdomen show no free intraperitoneal air. The bowel gas pattern is normal. No unexpected abdominal calcification is seen. No focal bony abnormality is identified. IMPRESSION: Negative exam. Electronically Signed   By: Drusilla Kanner M.D.   On: 06/18/2016 13:36       Clayton Massed, MD  [facility] Pager:[phone]  If 7PM-7AM, please contact night-coverage [email or web address] Password [facility] 06/19/2016, 2:04 PM

## 2016-06-20 DIAGNOSIS — N289 Disorder of kidney and ureter, unspecified: Secondary | ICD-10-CM

## 2016-06-20 LAB — URINALYSIS, ROUTINE W REFLEX MICROSCOPIC
BILIRUBIN URINE: NEGATIVE
Glucose, UA: 250 mg/dL — AB
HGB URINE DIPSTICK: NEGATIVE
KETONES UR: NEGATIVE mg/dL
Nitrite: NEGATIVE
PH: 6.5 (ref 5.0–8.0)
Protein, ur: >300 mg/dL — AB
SPECIFIC GRAVITY, URINE: 1.017 (ref 1.005–1.030)

## 2016-06-20 LAB — GLUCOSE, CAPILLARY
GLUCOSE-CAPILLARY: 144 mg/dL — AB (ref 65–99)
GLUCOSE-CAPILLARY: 250 mg/dL — AB (ref 65–99)
GLUCOSE-CAPILLARY: 92 mg/dL (ref 65–99)
Glucose-Capillary: 83 mg/dL (ref 65–99)
Glucose-Capillary: 248 mg/dL — ABNORMAL HIGH (ref 65–99)

## 2016-06-20 LAB — BASIC METABOLIC PANEL
ANION GAP: 7 (ref 5–15)
BUN: 52 mg/dL — AB (ref 6–20)
CHLORIDE: 108 mmol/L (ref 101–111)
CO2: 28 mmol/L (ref 22–32)
Calcium: 8.6 mg/dL — ABNORMAL LOW (ref 8.9–10.3)
Creatinine, Ser: 2.15 mg/dL — ABNORMAL HIGH (ref 0.61–1.24)
GFR calc Af Amer: 36 mL/min — ABNORMAL LOW (ref >60)
GFR calc non Af Amer: 31 mL/min — ABNORMAL LOW (ref >60)
GLUCOSE: 80 mg/dL (ref 65–99)
POTASSIUM: 3.5 mmol/L (ref 3.5–5.1)
Sodium: 143 mmol/L (ref 135–145)

## 2016-06-20 LAB — URINE MICROSCOPIC-ADD ON

## 2016-06-20 MED ORDER — INSULIN ASPART 100 UNIT/ML ~~LOC~~ SOLN
0.0000 [IU] | Freq: Three times a day (TID) | SUBCUTANEOUS | Status: DC
  Administered 2016-06-21: 2 [IU] via SUBCUTANEOUS

## 2016-06-20 NOTE — Evaluation (Signed)
Physical Therapy Evaluation Patient Details Name: Clayton [name] MRN: [id number] DOB: [birth year] Today's Date: 06/20/2016   History of Present Illness  65 y/o male admitted with nausea and vomiting and diagnosed with chronic kidney disease. PMH includes HTN, schizophrenia  Clinical Impression  Pt admitted with above diagnosis. Pt currently with functional limitations due to the deficits listed below (see PT Problem List). Pt will benefit from skilled PT to increase their independence and safety with mobility to allow discharge to the venue listed below.  Recommend returning to SNF.     Follow Up Recommendations SNF    Equipment Recommendations  None recommended by PT    Recommendations for Other Services       Precautions / Restrictions Precautions Precautions: Fall      Mobility  Bed Mobility Overal bed mobility: Needs Assistance Bed Mobility: Supine to Sit     Supine to sit: Min assist     General bed mobility comments: Pt not following instructions to use UE on mattress, pulling on PT's hands  Transfers Overall transfer level: Needs assistance Equipment used: Rolling walker (2 wheeled) Transfers: Sit to/from Stand Sit to Stand: Min assist;Mod assist         General transfer comment: MIN from standard surface and MOD from low surface. Cues for hand placement  Ambulation/Gait Ambulation/Gait assistance: Min guard Ambulation Distance (Feet): 95 Feet Assistive device: Rolling walker (2 wheeled) Gait Pattern/deviations: Decreased step length - right;Decreased step length - left;Shuffle;Trunk flexed Gait velocity: decreased   General Gait Details: Shuffeled gait pattern, but no physical A needed  Stairs            Wheelchair Mobility    Modified Rankin (Stroke Patients Only)       Balance Overall balance assessment: Needs assistance Sitting-balance support: Feet supported Sitting balance-Leahy Scale: Fair     Standing balance support:  Bilateral upper extremity supported Standing balance-Leahy Scale: Poor Standing balance comment: requires UE support                             Pertinent Vitals/Pain Pain Assessment: Faces Faces Pain Scale: Hurts a little bit Pain Location: stomach Pain Descriptors / Indicators: Aching Pain Intervention(s): Monitored during session    Home Living Family/patient expects to be discharged to:: Skilled nursing facility                      Prior Function Level of Independence: Independent with assistive device(s)         Comments: Amb with RW     Hand Dominance   Dominant Hand: Right    Extremity/Trunk Assessment   Upper Extremity Assessment: Overall WFL for tasks assessed (limited hand strength/grip)           Lower Extremity Assessment: Generalized weakness;Overall [facility] for tasks assessed      Cervical / Trunk Assessment: Normal  Communication   Communication: No difficulties  Cognition Arousal/Alertness: Awake/alert Behavior During Therapy: Flat affect Overall Cognitive Status: Within Functional Limits for tasks assessed                      General Comments      Exercises        Assessment/Plan    PT Assessment Patient needs continued PT services  PT Diagnosis Difficulty walking;Generalized weakness   PT Problem List Decreased activity tolerance;Decreased balance;Decreased mobility;Decreased strength  PT Treatment Interventions Gait training;DME instruction;Functional mobility training;Therapeutic  activities   PT Goals (Current goals can be found in the Care Plan section) Acute Rehab PT Goals Patient Stated Goal: [facility] Goal Formulation: With patient Time For Goal Achievement: 06/27/16 Potential to Achieve Goals: Good    Frequency Min 3X/week   Barriers to discharge        Co-evaluation               End of Session Equipment Utilized During Treatment: Gait belt Activity Tolerance:  Patient tolerated treatment well Patient left: in chair;with chair alarm set;with nursing/sitter in room Nurse Communication: Mobility status         Time: [id number] PT Time Calculation (min) (ACUTE ONLY): 22 min   Charges:   PT Evaluation $PT Eval Low Complexity: 1 Procedure     PT G Codes:        Kymir Coles LUBECK 06/20/2016, 11:27 AM

## 2016-06-20 NOTE — Progress Notes (Signed)
PROGRESS NOTE        PATIENT DETAILS Name: Clayton [name] Age: 65 y.o. Sex: male Date of Birth: December 01, 1951 Admit Date: 06/18/2016 Admitting Physician Ozella Rocksavid J Merrell, MD XBJ:[id number]:PROVIDER NOT IN SYSTEM   Brief Narrative: Patient is a 65 y.o. male with past medical history of hypertension, schizophrenia, currently residing in a skilled nursing facility admitted with nausea and vomiting, found to have acute on chronic kidney failure. Admitted for further evaluation and treatment  Subjective: Patient continues to feel nauseous, dry heaving, according to the nurse very poor oral intake  Assessment/Plan:   Acute kidney injury superimposed on chronic kidney disease stage IV: Baseline creatinine around 2  Acute kidney injury likely secondary to prerenal azotemia-in a setting of vomiting and continued use of diuretics. Creatinine improving with the fluids and other supportive measures.   renal ultrasound- no obstructive uropathy. Continue IV fluids at a lower rate, anticipate discharge tomorrow, pending improvement in renal function back to baseline, improvement in by mouth intake    Nausea with vomiting:? Etiology-viral gastroenteritis. Continues to be slightly nauseous with poor oral intake today. Abdomen not distended and very benign on exam. Abdominal x-ray and lipase levels were normal on 06/18/16. Plan is to continue to provide supportive care and advance diet as tolerated. Lipase within normal limits on admission. UA is still pending  Uncontrolled type 2 diabetes: CBGs much better this morning, continue 25 units of Levemir and SSI. Follow.  Hypertension: Better controlled than on admission-however still elevated-we will resume Cardizem and Imdur. Follow and adjust accordingly.  Anemia of chronic disease: Hemoglobin relatively stable, continue periodic monitoring. No evidence of overt blood loss.  Chronic diastolic heart failure: Euvolemic on exam, diuretics on hold  .Agree with not resume Aldactone on discharge. Patient's Lasix was decreased from 80 mg twice a day to 40 mg twice a day, his Aldactone was discontinued during last admission . Most recent 2-D echo 12/22/15, EF 55-60%  History of schizophrenia: Appears stable currently-continue with Haldol, Depakote. Follow.  History of polysubstance abuse: Residing in a skilled nursing facility prior to this admission-no indication of ongoing substance abuse currently.  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   DVT Prophylaxis: Prophylactic Lovenox   Code Status: Full code  Family Communication: None at bedside  Disposition Plan: Remain inpatient-SNF on discharge 7/14  Antimicrobial agents: None  Procedures: None  CONSULTS:  None  Time spent: 25 minutes-Greater than 50% of this time was spent in counseling, explanation of diagnosis, planning of further management, and coordination of care.  MEDICATIONS: Anti-infectives    None      Scheduled Meds: . diltiazem  240 mg Oral Daily  . divalproex  250 mg Oral BID  . enoxaparin (LOVENOX) injection  40 mg Subcutaneous Q24H  . ferrous sulfate  325 mg Oral Q breakfast  . folic acid  1 mg Intravenous Daily  . haloperidol  2 mg Oral BID  . insulin aspart  0-15 Units Subcutaneous Q4H  . insulin detemir  25 Units Subcutaneous Daily  . isosorbide mononitrate  30 mg Oral Daily  . senna  2 tablet Oral QHS  . sodium chloride flush  3 mL Intravenous Q12H  . thiamine  100 mg Intravenous Daily   Continuous Infusions: . sodium chloride 125 mL/hr at 06/20/16 0412   PRN Meds:.acetaminophen **OR** acetaminophen, hydrALAZINE, ipratropium-albuterol,  LORazepam, metoprolol, prochlorperazine   PHYSICAL EXAM: Vital signs: Filed Vitals:   06/19/16 0827 06/19/16 1731 06/19/16 2020 06/20/16 0451  BP: 162/89 134/69 115/70 123/85  Pulse: 102 94 92 87  Temp: 98 F (36.7 C) 98.6 F (37 C) 98.7 F (37.1 C) 98.5  F (36.9 C)  TempSrc: Oral Oral Oral Oral  Resp: 20 18 19 20   Height:      Weight:   84.5 kg (186 lb 4.6 oz)   SpO2: 95% 97% 99% 100%   Filed Weights   06/18/16 1627 06/18/16 2024 06/19/16 2020  Weight: 84.142 kg (185 lb 8 oz) 84.2 kg (185 lb 10 oz) 84.5 kg (186 lb 4.6 oz)   Body mass index is 26.73 kg/(m^2).   Gen Exam: Awake and alert with clear speech. Not in any distress  Neck: Supple, No JVD.   Chest: B/L Clear.   CVS: S1 S2 Regular, no murmurs.  Abdomen: soft, BS +, non tender, non distended.  Extremities: no edema, lower extremities warm to touch. Neurologic: Non Focal.   Skin: No Rash or lesions   Wounds: N/A.   LABORATORY DATA: CBC:  Recent Labs Lab 06/18/16 1139 06/19/16 0409  WBC 7.3 8.0  NEUTROABS 5.9  --   HGB 9.5* 8.6*  HCT 29.0* 27.4*  MCV 91.8 93.8  PLT 207 217    Basic Metabolic Panel:  Recent Labs Lab 06/18/16 1139 06/19/16 0409 06/20/16 0509  NA 139 145 143  K 4.0 3.6 3.5  CL 98* 106 108  CO2 29 32 28  GLUCOSE 336* 119* 80  BUN 51* 51* 52*  CREATININE 2.46* 2.38* 2.15*  CALCIUM 9.7 9.0 8.6*  MG 1.9  --   --     GFR: Estimated Creatinine Clearance: 36.8 mL/min (by C-G formula based on Cr of 2.15).  Liver Function Tests:  Recent Labs Lab 06/18/16 1139 06/19/16 0409  AST 26 25  ALT 22 18  ALKPHOS 57 48  BILITOT 0.8 0.6  PROT 6.9 6.2*  ALBUMIN 3.5 3.1*    Recent Labs Lab 06/18/16 1139  LIPASE 15   No results for input(s): AMMONIA in the last 168 hours.  Coagulation Profile: No results for input(s): INR, PROTIME in the last 168 hours.  Cardiac Enzymes: No results for input(s): CKTOTAL, CKMB, CKMBINDEX, TROPONINI in the last 168 hours.  BNP (last 3 results) No results for input(s): PROBNP in the last 8760 hours.  HbA1C: No results for input(s): HGBA1C in the last 72 hours.  CBG:  Recent Labs Lab 06/19/16 1728 06/19/16 2019 06/20/16 0021 06/20/16 0415 06/20/16 0742  GLUCAP 118* 99 144* 83 92    Lipid  Profile: No results for input(s): CHOL, HDL, LDLCALC, TRIG, CHOLHDL, LDLDIRECT in the last 72 hours.  Thyroid Function Tests: No results for input(s): TSH, T4TOTAL, FREET4, T3FREE, THYROIDAB in the last 72 hours.  Anemia Panel: No results for input(s): VITAMINB12, FOLATE, FERRITIN, TIBC, IRON, RETICCTPCT in the last 72 hours.  Urine analysis:    Component Value Date/Time   COLORURINE YELLOW 06/09/2016 2335   APPEARANCEUR CLEAR 06/09/2016 2335   LABSPEC 1.016 06/09/2016 2335   PHURINE 5.0 06/09/2016 2335   GLUCOSEU 250* 06/09/2016 2335   HGBUR NEGATIVE 06/09/2016 2335   BILIRUBINUR NEGATIVE 06/09/2016 2335   KETONESUR NEGATIVE 06/09/2016 2335   PROTEINUR 100* 06/09/2016 2335   UROBILINOGEN 0.2 10/08/2015 0634   NITRITE NEGATIVE 06/09/2016 2335   LEUKOCYTESUR NEGATIVE 06/09/2016 2335    Sepsis Labs: Lactic Acid, Venous  Component Value Date/Time   LATICACIDVEN 0.86 06/09/2016 1412    MICROBIOLOGY: No results found for this or any previous visit (from the past 240 hour(s)).  RADIOLOGY STUDIES/RESULTS: US Renal  06/19/2016  CLINICAL DATA:  Acute renal failure EXAM: RENAL / URINARY TRACT ULTRASOUND COMPLETE COMPARISON:  None. FINDINGS: Right Kidney: Length: 9.1 cm. Increased renal cortical echogenicity. Echogenicity within normal limits. No mass or hydronephrosis visualized. Left Kidney: Length: 10.9 cm. Increased renal cortical echogenicity. Echogenicity within normal limits. No mass or hydronephrosis visualized. Bladder: Appears normal for degree of bladder distention. IMPRESSION: 1. No obstructive uropathy. 2. Increased renal cortical echogenicity as can be seen with medical renal disease or acute renal injury. Electronically Signed   By: Elige Ko   On: 06/19/2016 19:36   Dg Abd Acute W/chest  06/18/2016  CLINICAL DATA:  Abdominal pain, vomiting and weakness for 2 weeks. EXAM: DG ABDOMEN ACUTE W/ 1V CHEST COMPARISON:  PA and lateral chest 01/28/2016. CT abdomen and pelvis  01/10/2016. FINDINGS: Single-view of the chest demonstrates clear lungs. No pneumothorax or pleural effusion. Heart size is upper normal. Two views of the abdomen show no free intraperitoneal air. The bowel gas pattern is normal. No unexpected abdominal calcification is seen. No focal bony abnormality is identified. IMPRESSION: Negative exam. Electronically Signed   By: Drusilla Kanner M.D.   On: 06/18/2016 13:36     LOS: 1 day   Richarda Overlie, MD  [facility] Pager:[phone]  [phone]  If 7PM-7AM, please contact night-coverage [email or web address] Password [facility] 06/20/2016, 9:42 AM

## 2016-06-21 DIAGNOSIS — N179 Acute kidney failure, unspecified: Principal | ICD-10-CM

## 2016-06-21 DIAGNOSIS — N189 Chronic kidney disease, unspecified: Secondary | ICD-10-CM

## 2016-06-21 DIAGNOSIS — E869 Volume depletion, unspecified: Secondary | ICD-10-CM

## 2016-06-21 DIAGNOSIS — R112 Nausea with vomiting, unspecified: Secondary | ICD-10-CM

## 2016-06-21 LAB — CBC
HCT: 23.5 % — ABNORMAL LOW (ref 39.0–52.0)
Hemoglobin: 7.6 g/dL — ABNORMAL LOW (ref 13.0–17.0)
MCH: 30.5 pg (ref 26.0–34.0)
MCHC: 32.3 g/dL (ref 30.0–36.0)
MCV: 94.4 fL (ref 78.0–100.0)
PLATELETS: 187 10*3/uL (ref 150–400)
RBC: 2.49 MIL/uL — ABNORMAL LOW (ref 4.22–5.81)
RDW: 14.7 % (ref 11.5–15.5)
WBC: 5 10*3/uL (ref 4.0–10.5)

## 2016-06-21 LAB — COMPREHENSIVE METABOLIC PANEL
ALK PHOS: 42 U/L (ref 38–126)
ALT: 16 U/L — AB (ref 17–63)
AST: 16 U/L (ref 15–41)
Albumin: 2.6 g/dL — ABNORMAL LOW (ref 3.5–5.0)
Anion gap: 8 (ref 5–15)
BUN: 46 mg/dL — AB (ref 6–20)
CALCIUM: 8.5 mg/dL — AB (ref 8.9–10.3)
CHLORIDE: 106 mmol/L (ref 101–111)
CO2: 27 mmol/L (ref 22–32)
CREATININE: 1.94 mg/dL — AB (ref 0.61–1.24)
GFR calc Af Amer: 41 mL/min — ABNORMAL LOW (ref >60)
GFR, EST NON AFRICAN AMERICAN: 35 mL/min — AB (ref >60)
Glucose, Bld: 106 mg/dL — ABNORMAL HIGH (ref 65–99)
Potassium: 3.5 mmol/L (ref 3.5–5.1)
Sodium: 141 mmol/L (ref 135–145)
Total Bilirubin: 0.5 mg/dL (ref 0.3–1.2)
Total Protein: 5.7 g/dL — ABNORMAL LOW (ref 6.5–8.1)

## 2016-06-21 LAB — GLUCOSE, CAPILLARY
Glucose-Capillary: 104 mg/dL — ABNORMAL HIGH (ref 65–99)
Glucose-Capillary: 123 mg/dL — ABNORMAL HIGH (ref 65–99)

## 2016-06-21 MED ORDER — FUROSEMIDE 40 MG PO TABS: 40.0000 mg | ORAL_TABLET | Freq: Two times a day (BID) | ORAL | Status: AC

## 2016-06-21 NOTE — Clinical Social Work Note (Signed)
Clinical Social Work Assessment  Patient Details  Name: Clayton [name] L Shirk MRN: [id number] Date of Birth: [birth year]  Date of referral:  06/20/16               Reason for consult:  Facility Placement                Permission sought to share information with:  Magazine features editoracility Contact Representative (CSW unable to reach any of the contacts listed) Permission granted to share information::  No  Name::        Agency::     Relationship::     Contact Information:     Housing/Transportation Living arrangements for the past 2 months:  [facility] Nursing [facility] Source of Information:  Patient, Other (Comment Required) (Patient Chart, contact with facility) Patient Interpreter Needed:  None Criminal Activity/Legal Involvement Pertinent to Current Situation/Hospitalization:  No - Comment as needed Significant Relationships:  Friend (Listed in chart: [name](Pastor) Lynden OxfordNelson Johnson, friend Cletis Bronson and Erin HearingKeon Butcher.) Lives with:  Facility Resident [facility] Do you feel safe going back to the place where you live?  Yes (Patient agreeable to [facility]) Need for family participation in patient care:  No (Coment)  Care giving concerns:  None expressed by patient. [name] is from a skilled facility and will return there at discharge.   Social Worker assessment / plan:  CSW talked with patient regarding his readiness for discharge and [facility] and patient in agreement. He immediately expressed that he was ready to go and the nurse needed to get him ready.  CSW attempted to reach listed contacts: [name](Pastor) Davina PokeNelson Johnson-the number in chart is his church and he is not at the church today (7/13); Cletis Bronson - left voice mail and Erin HearingKeon Butcher -- [phone], wrong number.  Employment status:  Disabled (Comment on whether or not currently receiving Disability) Insurance information:  [place] PT Recommendations:  Skilled Nursing  Facility Information / Referral to community resources:  Other (Comment Required) (None needed or requested as patient from facility)  Patient/Family's Response to care:  No concerns expressed regarding the care received during hospitalization.  Patient/Family's Understanding of and Emotional Response to Diagnosis, Current Treatment, and Prognosis:  Not discussed.  Emotional Assessment Appearance:  Appears stated age Attitude/Demeanor/Rapport:  Other (Patient was a bit irritable but not combative) Affect (typically observed):  Irritable Orientation:  Oriented to Self, Oriented to Place Alcohol / Substance use:  Tobacco Use, Alcohol Use, Illicit Drugs (Patient reports that he smokes cigarettes. He has a history of crack cocaine and marijuana use. On 12/06/15 patient reported that he last used crack 3/4 months ago.) Psych involvement (Current and /or in the community):  No (Comment)  Discharge Needs  Concerns to be addressed:  Discharge Planning Concerns Readmission within the last 30 days:  Yes Current discharge risk:  None Barriers to Discharge:  No Barriers Identified   Cristobal GoldmannCrawford, Jullien Granquist Bradley, LCSW 06/21/2016, 3:25 PM

## 2016-06-21 NOTE — Progress Notes (Signed)
Patient refused pm CBG, temperature check, and weight. Will continue to monitor the patient closely.   Feliciana RossettiLaura Carrel Leather, RN, BSN

## 2016-06-21 NOTE — Discharge Summary (Signed)
Physician Discharge Summary  Clayton [name] ZOX:[id number] DOB: Jun 23, 1951 DOA: 06/18/2016  PCP: PROVIDER NOT IN SYSTEM  Admit date: 06/18/2016 Discharge date: 06/21/2016  Time spent: Greater than 30 minutes  Recommendations for Outpatient Follow-up:  1. Follow up with PCP within one week 2. Check BMP in 3 days 3. Discharge back to SNF   Discharge Diagnoses:  Principal Problem:   Acute kidney injury superimposed on chronic kidney disease (HCC) Active Problems:   History of Polysubstance abuse   Accelerated hypertension   Anemia of chronic disease   Type 2 diabetes mellitus with hyperosmolar nonketotic hyperglycemia (HCC)   Prolonged Q-T interval on ECG   Dehydration   Nausea & vomiting   Schizophrenia (HCC)   Chronic diastolic heart failure, NYHA class 1 (HCC)   Acute kidney injury (HCC)   Essential hypertension   Acute renal insufficiency   Discharge Condition: Stable  Diet recommendation: Diabetic/Renal diet  Filed Weights   06/18/16 1627 06/18/16 2024 06/19/16 2020  Weight: 84.142 kg (185 lb 8 oz) 84.2 kg (185 lb 10 oz) 84.5 kg (186 lb 4.6 oz)    History of present illness: 65 year old male with medical history significant for diabetes on insulin, schizophrenia, per tension, grade 1 diastolic dysfunction, prior polysubstance abuse to include alcohol and cocaine, chronic kidney disease, chronic prolonged QT interval ECG, history of medication noncompliance and homelessness currently residing in skilled nursing facility. Patient was recently discharged on 7/5 after an admission for acute kidney injury related to dehydration. Recent with near syncope at that time. At discharge, Lasix was decreased from 80 mg twice a day to 40 mg twice a day and it was documented that his Aldactone and potassium supplements were discontinued. Patient returned to the ER with reports of nausea and vomiting. He was found to have acute kidney injury and hyperglycemia with a glucose greater than  300. Patient denied abdominal pain or diarrhea. History was somewhat limited due to patient's underlying psychiatric disorders and limited documentation accompanied the patient and the nursing facility. Of note, patient's medication reconciliation sheet demonstrated he was taking potassium supplementation as well as Aldactone.   Hospital Course: Patient was admitted for further assessment and management. Nausea and vomiting were managed supportively. Lasix dose was decreased. Aldactone was not restarted. Patient was volume resuscitated. Patient's renal function is back to baseline and the nausea and vomiting have resolved. Patient is eager to be discharged back to the SNF. The PCP will kindly repeat BMP in 72 hours.   Consultations:  None  Discharge Exam: Filed Vitals:   06/21/16 0519 06/21/16 1000  BP: 147/80 182/82  Pulse: 93   Temp: 98.2 F (36.8 C) 98.6 F (37 C)  Resp: 22 22    General: Not in distress. Bad eye sight. Cardiovascular: S1S2 Respiratory: Clear to auscultation  Discharge Instructions   Discharge Instructions    Diet - low sodium heart healthy    Complete by:  As directed      Diet Carb Modified    Complete by:  As directed      Discharge instructions    Complete by:  As directed   Follow up with PCP within one week     Increase activity slowly    Complete by:  As directed           Current Discharge Medication List    CONTINUE these medications which have CHANGED   Details  furosemide (LASIX) 40 MG tablet Take 1 tablet (40 mg total) by  mouth 2 (two) times daily. Qty: 60 tablet, Refills: 0      CONTINUE these medications which have NOT CHANGED   Details  diltiazem (CARTIA XT) 240 MG 24 hr capsule Take 1 capsule (240 mg total) by mouth daily. Qty: 30 capsule, Refills: 0    divalproex (DEPAKOTE SPRINKLE) 125 MG capsule Take 250 mg by mouth 2 (two) times daily.    ferrous sulfate 325 (65 FE) MG tablet Take 1 tablet (325 mg total) by mouth daily with  breakfast. Qty: 30 tablet, Refills: 0    haloperidol (HALDOL) 2 MG tablet Take 2 mg by mouth 2 (two) times daily.    hydrALAZINE (APRESOLINE) 50 MG tablet Take 1 tablet (50 mg total) by mouth 3 (three) times daily.    insulin detemir (LEVEMIR) 100 UNIT/ML injection Inject 0.25 mLs (25 Units total) into the skin daily.    insulin lispro (HUMALOG) 100 UNIT/ML injection Inject 2-10 Units into the skin 3 (three) times daily with meals. Per sliding scale: 201-250= 2 units; 251-300= 4 units; 301-350= 6 units; 351-400= 8 units; if above 401= 10 units, recheck in 1 hours if still above 401 call MD.    ipratropium-albuterol (DUONEB) 0.5-2.5 (3) MG/3ML SOLN Take 3 mLs by nebulization every 4 (four) hours as needed.    isosorbide mononitrate (IMDUR) 30 MG 24 hr tablet Take 1 tablet (30 mg total) by mouth daily. Qty: 30 tablet, Refills: 0    LORazepam (ATIVAN) 0.5 MG tablet Take 0.5 mg by mouth 2 (two) times daily as needed for anxiety (breakthrough anxiety).    omeprazole (PRILOSEC) 20 MG capsule Take 20 mg by mouth daily.    ranitidine (ZANTAC) 150 MG tablet Take 150 mg by mouth 2 (two) times daily.    senna (SENOKOT) 8.6 MG tablet Take 2 tablets by mouth at bedtime.      STOP taking these medications     alum & mag hydroxide-simeth (MAALOX/MYLANTA) 200-200-20 MG/5ML suspension      guaiFENesin (MUCINEX) 600 MG 12 hr tablet      insulin aspart (NOVOLOG) 100 UNIT/ML injection        Allergies  Allergen Reactions  . Latex Rash      The results of significant diagnostics from this hospitalization (including imaging, microbiology, ancillary and laboratory) are listed below for reference.    Significant Diagnostic Studies: [place] Renal  06/19/2016  CLINICAL DATA:  Acute renal failure EXAM: RENAL / URINARY TRACT ULTRASOUND COMPLETE COMPARISON:  None. FINDINGS: Right Kidney: Length: 9.1 cm. Increased renal cortical echogenicity. Echogenicity within normal limits. No mass or hydronephrosis  visualized. Left Kidney: Length: 10.9 cm. Increased renal cortical echogenicity. Echogenicity within normal limits. No mass or hydronephrosis visualized. Bladder: Appears normal for degree of bladder distention. IMPRESSION: 1. No obstructive uropathy. 2. Increased renal cortical echogenicity as can be seen with medical renal disease or acute renal injury. Electronically Signed   By: Elige KoHetal  Patel   On: 06/19/2016 19:36   Dg Abd Acute W/chest  06/18/2016  CLINICAL DATA:  Abdominal pain, vomiting and weakness for 2 weeks. EXAM: DG ABDOMEN ACUTE W/ 1V CHEST COMPARISON:  PA and lateral chest 01/28/2016. CT abdomen and pelvis 01/10/2016. FINDINGS: Single-view of the chest demonstrates clear lungs. No pneumothorax or pleural effusion. Heart size is upper normal. Two views of the abdomen show no free intraperitoneal air. The bowel gas pattern is normal. No unexpected abdominal calcification is seen. No focal bony abnormality is identified. IMPRESSION: Negative exam. Electronically Signed   By: Maisie Fushomas  Dalessio M.D.   On: 06/18/2016 13:36    Microbiology: No results found for this or any previous visit (from the past 240 hour(s)).   Labs: Basic Metabolic Panel:  Recent Labs Lab 06/18/16 1139 06/19/16 0409 06/20/16 0509 06/21/16 0400  NA 139 145 143 141  K 4.0 3.6 3.5 3.5  CL 98* 106 108 106  CO2 29 32 28 27  GLUCOSE 336* 119* 80 106*  BUN 51* 51* 52* 46*  CREATININE 2.46* 2.38* 2.15* 1.94*  CALCIUM 9.7 9.0 8.6* 8.5*  MG 1.9  --   --   --    Liver Function Tests:  Recent Labs Lab 06/18/16 1139 06/19/16 0409 06/21/16 0400  AST ALT 22 18 16*  ALKPHOS 57 48 42  BILITOT 0.8 0.6 0.5  PROT 6.9 6.2* 5.7*  ALBUMIN 3.5 3.1* 2.6*    Recent Labs Lab 06/18/16 1139  LIPASE 15   No results for input(s): AMMONIA in the last 168 hours. CBC:  Recent Labs Lab 06/18/16 1139 06/19/16 0409 06/21/16 0400  WBC 7.3 8.0 5.0  NEUTROABS 5.9  --   --   HGB 9.5* 8.6* 7.6*  HCT 29.0*  27.4* 23.5*  MCV 91.8 93.8 94.4  PLT 207 217 187   Cardiac Enzymes: No results for input(s): CKTOTAL, CKMB, CKMBINDEX, TROPONINI in the last 168 hours. BNP: BNP (last 3 results)  Recent Labs  12/16/15 1913 01/09/16 1559 01/28/16 2207  BNP 1348.3* 500.3* 457.9*    ProBNP (last 3 results) No results for input(s): PROBNP in the last 8760 hours.  CBG:  Recent Labs Lab 06/20/16 0742 06/20/16 1155 06/20/16 1631 06/21/16 0729 06/21/16 1143  GLUCAP 92 250* 248* 104* 123*       Signed:  Berton Mount, MD  [facility] Pager #: [phone] 7PM-7AM contact night coverage as above

## 2016-06-21 NOTE — Clinical Social Work Note (Signed)
[name] medically stable for [facility] today. Discharge clinicals transmitted to facility and patient will be transported by ambulance. CSW attempted to reach contacts listed but not successful (see assessment note).   Genelle BalVanessa Caylen Kuwahara, MSW, LCSW Licensed Clinical Social Worker Clinical Social Work Department [facility] [phone]

## 2016-06-21 NOTE — Progress Notes (Signed)
[facility] called to [facility] spoke with Nurse. Pts IV dc'd. And pt ready for transportation.

## 2016-07-22 ENCOUNTER — Encounter (HOSPITAL_COMMUNITY): Payer: Self-pay | Admitting: Emergency Medicine

## 2016-07-22 ENCOUNTER — Emergency Department (HOSPITAL_COMMUNITY)
Admission: EM | Admit: 2016-07-22 | Discharge: 2016-07-22 | Disposition: A | Discharge status: Home / Self Care | Payer: Medicaid Other | Attending: Emergency Medicine | Admitting: Emergency Medicine

## 2016-07-22 DIAGNOSIS — N183 Chronic kidney disease, stage 3 (moderate): Secondary | ICD-10-CM | POA: Insufficient documentation

## 2016-07-22 DIAGNOSIS — I5032 Chronic diastolic (congestive) heart failure: Secondary | ICD-10-CM | POA: Diagnosis not present

## 2016-07-22 DIAGNOSIS — Z79899 Other long term (current) drug therapy: Secondary | ICD-10-CM | POA: Insufficient documentation

## 2016-07-22 DIAGNOSIS — F0390 Unspecified dementia without behavioral disturbance: Secondary | ICD-10-CM | POA: Diagnosis not present

## 2016-07-22 DIAGNOSIS — R112 Nausea with vomiting, unspecified: Secondary | ICD-10-CM | POA: Diagnosis present

## 2016-07-22 DIAGNOSIS — Z794 Long term (current) use of insulin: Secondary | ICD-10-CM | POA: Diagnosis not present

## 2016-07-22 DIAGNOSIS — I130 Hypertensive heart and chronic kidney disease with heart failure and stage 1 through stage 4 chronic kidney disease, or unspecified chronic kidney disease: Secondary | ICD-10-CM | POA: Insufficient documentation

## 2016-07-22 DIAGNOSIS — F17210 Nicotine dependence, cigarettes, uncomplicated: Secondary | ICD-10-CM | POA: Insufficient documentation

## 2016-07-22 HISTORY — DX: Schizophrenia, unspecified: F20.9

## 2016-07-22 LAB — COMPREHENSIVE METABOLIC PANEL
ALBUMIN: 3.2 g/dL — AB (ref 3.5–5.0)
ALT: 23 U/L (ref 17–63)
AST: 37 U/L (ref 15–41)
Alkaline Phosphatase: 65 U/L (ref 38–126)
Anion gap: 10 (ref 5–15)
BILIRUBIN TOTAL: 0.7 mg/dL (ref 0.3–1.2)
BUN: 51 mg/dL — AB (ref 6–20)
CALCIUM: 8.7 mg/dL — AB (ref 8.9–10.3)
CO2: 27 mmol/L (ref 22–32)
CREATININE: 2.49 mg/dL — AB (ref 0.61–1.24)
Chloride: 102 mmol/L (ref 101–111)
GFR calc Af Amer: 30 mL/min — ABNORMAL LOW (ref >60)
GFR, EST NON AFRICAN AMERICAN: 26 mL/min — AB (ref >60)
GLUCOSE: 215 mg/dL — AB (ref 65–99)
POTASSIUM: 4.3 mmol/L (ref 3.5–5.1)
Sodium: 139 mmol/L (ref 135–145)
TOTAL PROTEIN: 6.3 g/dL — AB (ref 6.5–8.1)

## 2016-07-22 LAB — CBC WITH DIFFERENTIAL/PLATELET
Basophils Absolute: 0 K/uL (ref 0.0–0.1)
Basophils Relative: 0 %
Eosinophils Absolute: 0.1 K/uL (ref 0.0–0.7)
Eosinophils Relative: 2 %
HCT: 25.5 % — ABNORMAL LOW (ref 39.0–52.0)
Hemoglobin: 8.4 g/dL — ABNORMAL LOW (ref 13.0–17.0)
Lymphocytes Relative: 14 %
Lymphs Abs: 0.8 K/uL (ref 0.7–4.0)
MCH: 30.3 pg (ref 26.0–34.0)
MCHC: 32.9 g/dL (ref 30.0–36.0)
MCV: 92.1 fL (ref 78.0–100.0)
Monocytes Absolute: 0.4 K/uL (ref 0.1–1.0)
Monocytes Relative: 7 %
Neutro Abs: 4 K/uL (ref 1.7–7.7)
Neutrophils Relative %: 77 %
Platelets: 327 K/uL (ref 150–400)
RBC: 2.77 MIL/uL — ABNORMAL LOW (ref 4.22–5.81)
RDW: 14.8 % (ref 11.5–15.5)
WBC: 5.3 K/uL (ref 4.0–10.5)

## 2016-07-22 LAB — LIPASE, BLOOD: Lipase: 19 U/L (ref 11–51)

## 2016-07-22 MED ORDER — SODIUM CHLORIDE 0.9 % IV BOLUS (SEPSIS)
500.0000 mL | Freq: Once | INTRAVENOUS | Status: AC
  Administered 2016-07-22: 500 mL via INTRAVENOUS

## 2016-07-22 NOTE — ED Provider Notes (Signed)
[facility]-EMERGENCY DEPT Provider Note   CSN: [id number] Arrival date & time: 07/22/16  [id number]  First Provider Contact:  None       History   Chief Complaint Chief Complaint  Patient presents with  . GI Bleeding    HPI Clayton [name] is a 65 y.o. male.  Patient is a 65 year old male with history of diabetes, chronic renal insufficiency, polysubstance abuse who was sent from an extended care facility for evaluation of vomiting. I am told he had one episode of what appeared to be a "coffee-ground emesis" to the nursing home staff. The patient is a difficult historian due to his history of dementia and psychiatric illness. He denies to me he is experiencing any significant discomfort.  A Level 5 caveat secondary to Dementia.   The history is provided by the patient.    Past Medical History:  Diagnosis Date  . Anemia   . CHF (congestive heart failure) (HCC)   . CKD (chronic kidney disease), stage III   . Depression   . DVT (deep venous thrombosis) (HCC)    LLE  . GERD (gastroesophageal reflux disease)   . HTN (hypertension)   . Medical non-compliance   . Migraine    "a few times/month" (12/06/2015)  . Pneumonia 12/06/2015  . Polysubstance abuse    cocaine  . Schizophrenia (HCC)   . Type II diabetes mellitus [facility](HCC)     Patient Active Problem List   Diagnosis Date Noted  . Acute renal insufficiency   . Dehydration 06/18/2016  . Nausea & vomiting 06/18/2016  . Schizophrenia (HCC) 06/18/2016  . Chronic diastolic heart failure, NYHA class 1 (HCC) 06/18/2016  . Acute kidney injury (HCC) 06/18/2016  . Essential hypertension   . AKI (acute kidney injury) (HCC) 06/09/2016  . Near syncope 06/09/2016  . Acute pulmonary edema (HCC) 01/09/2016  . Type 2 diabetes mellitus with hyperosmolar nonketotic hyperglycemia (HCC) 12/16/2015  . Prolonged Q-T interval on ECG 12/16/2015  . CKD (chronic kidney disease) 12/11/2015  . Diabetes mellitus with complication (HCC) 12/11/2015  .  Homeless 12/11/2015  . Anemia of chronic disease 12/11/2015  . Abnormal chest x-ray 12/06/2015  . Hyperglycemia 12/04/2015  . Hypertensive urgency 12/04/2015  . Cocaine abuse 10/08/2015  . Acute kidney injury superimposed on chronic kidney disease (HCC) 08/17/2015  . Accelerated hypertension   . History of Polysubstance abuse 12/13/2011  . Hyponatremia 12/01/2011    Past Surgical History:  Procedure Laterality Date  . NO PAST SURGERIES         Home Medications    Prior to Admission medications   Medication Sig Start Date End Date Taking? Authorizing Provider  diltiazem (CARTIA XT) 240 MG 24 hr capsule Take 1 capsule (240 mg total) by mouth daily. 01/15/16  Yes Rolly SalterPranav M Patel, MD  divalproex (DEPAKOTE SPRINKLE) 125 MG capsule Take 250 mg by mouth 2 (two) times daily.   Yes Historical Provider, MD  ferrous sulfate 325 (65 FE) MG tablet Take 1 tablet (325 mg total) by mouth daily with breakfast. 12/13/15  Yes Rolly SalterPranav M Patel, MD  furosemide (LASIX) 40 MG tablet Take 1 tablet (40 mg total) by mouth 2 (two) times daily. 06/21/16  Yes Barnetta ChapelSylvester I Ogbata, MD  guaiFENesin (MUCINEX) 600 MG 12 hr tablet Take 600 mg by mouth 2 (two) times daily as needed for cough or to loosen phlegm.   Yes Historical Provider, MD  haloperidol (HALDOL) 1 MG tablet Take 1 mg by mouth 2 (two) times daily.  Yes Historical Provider, MD  haloperidol lactate (HALDOL) 5 MG/ML injection Inject 1 mg into the muscle once.   Yes Historical Provider, MD  hydrALAZINE (APRESOLINE) 50 MG tablet Take 1 tablet (50 mg total) by mouth 3 (three) times daily. 01/15/16  Yes Rolly Salter, MD  insulin detemir (LEVEMIR) 100 UNIT/ML injection Inject 0.25 mLs (25 Units total) into the skin daily. 12/24/15  Yes Shanker Levora Dredge, MD  insulin lispro (HUMALOG) 100 UNIT/ML injection Inject 2-10 Units into the skin 3 (three) times daily with meals. Per sliding scale: 201-250= 2 units; 251-300= 4 units; 301-350= 6 units; 351-400= 8 units; if above  401= 10 units, recheck in 1 hours if still above 401 call MD.   Yes Historical Provider, MD  ipratropium-albuterol (DUONEB) 0.5-2.5 (3) MG/3ML SOLN Take 3 mLs by nebulization every 4 (four) hours as needed. Patient taking differently: Take 3 mLs by nebulization every 4 (four) hours as needed (shortness of breath).  12/24/15  Yes Shanker Levora Dredge, MD  isosorbide mononitrate (IMDUR) 30 MG 24 hr tablet Take 1 tablet (30 mg total) by mouth daily. 01/15/16  Yes Rolly Salter, MD  LORazepam (ATIVAN) 0.5 MG tablet Take 0.5 mg by mouth 2 (two) times daily as needed for anxiety (breakthrough anxiety).   Yes Historical Provider, MD  omeprazole (PRILOSEC) 20 MG capsule Take 20 mg by mouth daily at 6 (six) AM.    Yes Historical Provider, MD  promethazine (PHENERGAN) 25 MG/ML injection Inject 25 mg into the muscle once.   Yes Historical Provider, MD  ranitidine (ZANTAC) 150 MG tablet Take 150 mg by mouth 2 (two) times daily.   Yes Historical Provider, MD  senna (SENOKOT) 8.6 MG tablet Take 2 tablets by mouth at bedtime.   Yes Historical Provider, MD    Family History Family History  Problem Relation Age of Onset  . Diabetes Mellitus II Mother   . Diabetes Mellitus II Father   . Stroke Maternal Grandfather     Social History Social History  Substance Use Topics  . Smoking status: Current Every Day Smoker    Packs/day: 0.25    Years: 48.00    Types: Cigarettes  . Smokeless tobacco: Never Used  . Alcohol use Yes     Allergies   Latex   Review of Systems Review of Systems  Unable to perform ROS: Dementia  Endocrine: Positive for cold intolerance.     Physical Exam Updated Vital Signs BP 176/91   Pulse 102   Temp 99.8 F (37.7 C)   Resp 14   Ht 6\' 2"  (1.88 m)   Wt 190 lb (86.2 kg)   SpO2 95%   BMI 24.39 kg/m   Physical Exam  Constitutional: He appears well-developed and well-nourished. No distress.  HENT:  Head: Normocephalic and atraumatic.  Mouth/Throat: Oropharynx is clear  and moist.  Neck: Normal range of motion. Neck supple.  Cardiovascular: Normal rate and regular rhythm.  Exam reveals no friction rub.   No murmur heard. Pulmonary/Chest: Effort normal and breath sounds normal. No respiratory distress. He has no wheezes. He has no rales.  Abdominal: Soft. Bowel sounds are normal. He exhibits distension. There is no tenderness. There is no rebound and no guarding.  There is mild tenderness to palpation in the epigastric region. There is no rebound and no guarding.  Genitourinary: Rectum normal. Rectal exam shows guaiac negative stool.  Musculoskeletal: Normal range of motion. He exhibits no edema.  Neurological: He is alert. Coordination normal.  Neurologic exam is difficult secondary to dementia, however he does move all extremities and responds to commands.  Skin: Skin is warm and dry. He is not diaphoretic.  Nursing note and vitals reviewed.    ED Treatments / Results  Labs (all labs ordered are listed, but only abnormal results are displayed) Labs Reviewed  CBC WITH DIFFERENTIAL/PLATELET - Abnormal; Notable for the following:       Result Value   RBC 2.77 (*)    Hemoglobin 8.4 (*)    HCT 25.5 (*)    All other components within normal limits  COMPREHENSIVE METABOLIC PANEL  LIPASE, BLOOD  POC OCCULT BLOOD, ED    EKG  EKG Interpretation None       Radiology No results found.  Procedures Procedures (including critical care time)  Medications Ordered in ED Medications  sodium chloride 0.9 % bolus 500 mL (500 mLs Intravenous New Bag/Given 07/22/16 0536)     Initial Impression / Assessment and Plan / ED Course  I have reviewed the triage vital signs and the nursing notes.  Pertinent labs & imaging results that were available during my care of the patient were reviewed by me and considered in my medical decision making (see chart for details).  Clinical Course   Final Clinical Impressions(s) / ED Diagnoses   Final diagnoses:    None   Patient sent from nursing home for evaluation of coffee-ground emesis. The patient has had no further emesis in the emergency department and has no complaints. He is afebrile, vitals are stable, hemoglobin is 1 g higher than 1 month ago, and his stools are heme negative. I see no indication for further workup, unless his condition changes. I feel as though he can be safely returned to the nursing home and sent back to the ER should this happen.  New Prescriptions New Prescriptions   No medications on file     Geoffery Lyonsouglas Shawndra Clute, MD 07/22/16 [id number]

## 2016-07-22 NOTE — ED Triage Notes (Signed)
Per EMS [facility] sent [facility] of [facility]d pain and possible GI Bleed. EMS reports pt had emesis of coffee ground consistency and nursing home staff administered 25mg  Phenergan for nausea/vomiting. Pt alert at baseline per EMS.

## 2016-07-22 NOTE — ED Notes (Signed)
Dr.Delo at bedside to perform stool occult blood sample

## 2016-07-22 NOTE — ED Notes (Signed)
No active vomiting at this time from pt. IV access attempted x 3 all attempts unsuccessful.

## 2016-07-22 NOTE — Discharge Instructions (Signed)
Return to the emergency department for high fever, bloody vomit or stool, severe abdominal pain, or other new and concerning symptoms.

## 2016-07-22 NOTE — ED Notes (Signed)
Bed: [id number] Expected date:  Expected time:  Means of arrival:  Comments: EMS 65yo M abd pain / N/V

## 2016-07-22 NOTE — ED Notes (Signed)
[facility] [employer] notified of need for transport of pt back to residence.  

## 2016-08-02 IMAGING — CR DG CHEST 2V
2 series · 2 of 2 positions shown · non-contrast
Comparison: August 16, 2015

CLINICAL DATA: Hyperglycemia.  Hypertension.  Shortness of Breath

EXAM:
CHEST  2 VIEW

[chest lat]
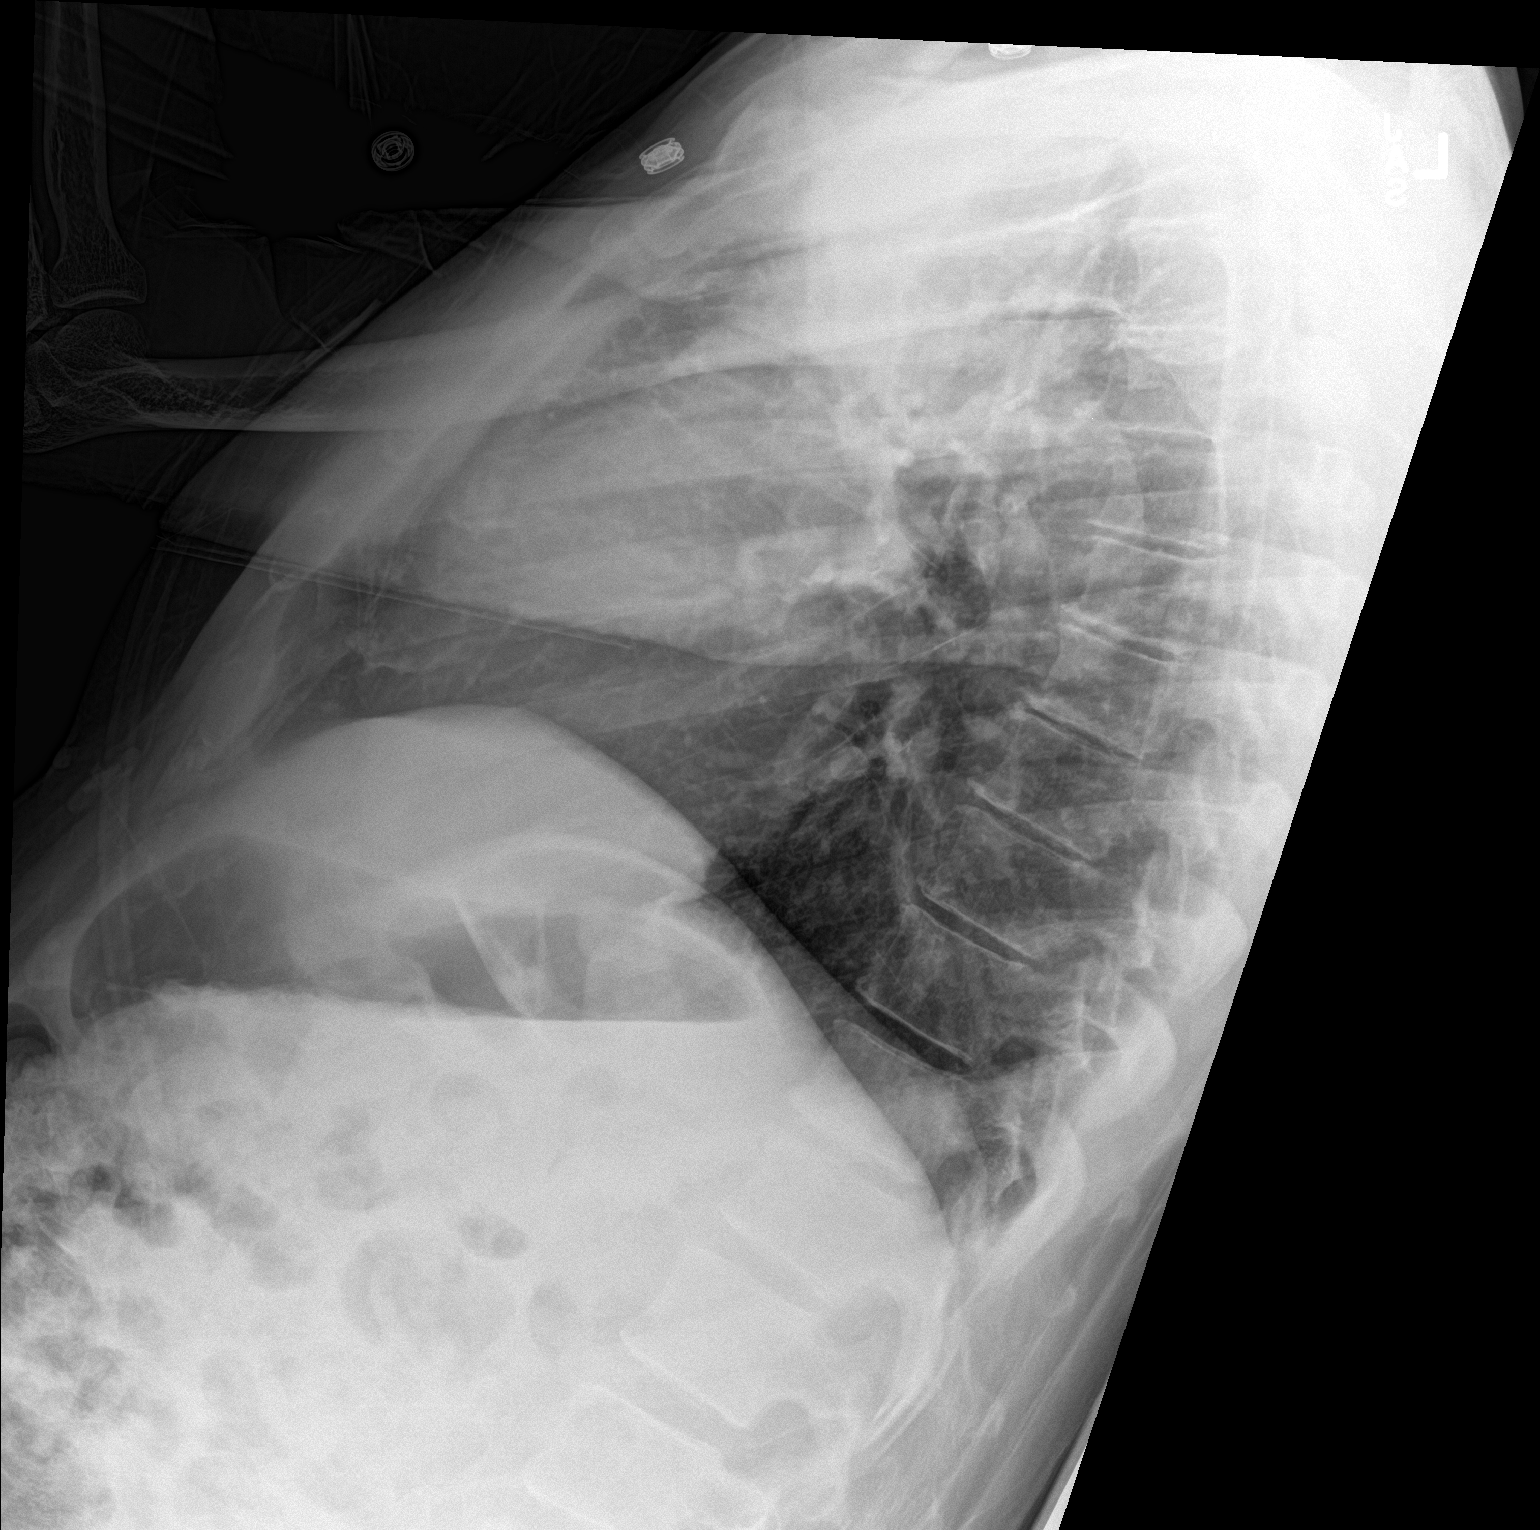

[chest ap]
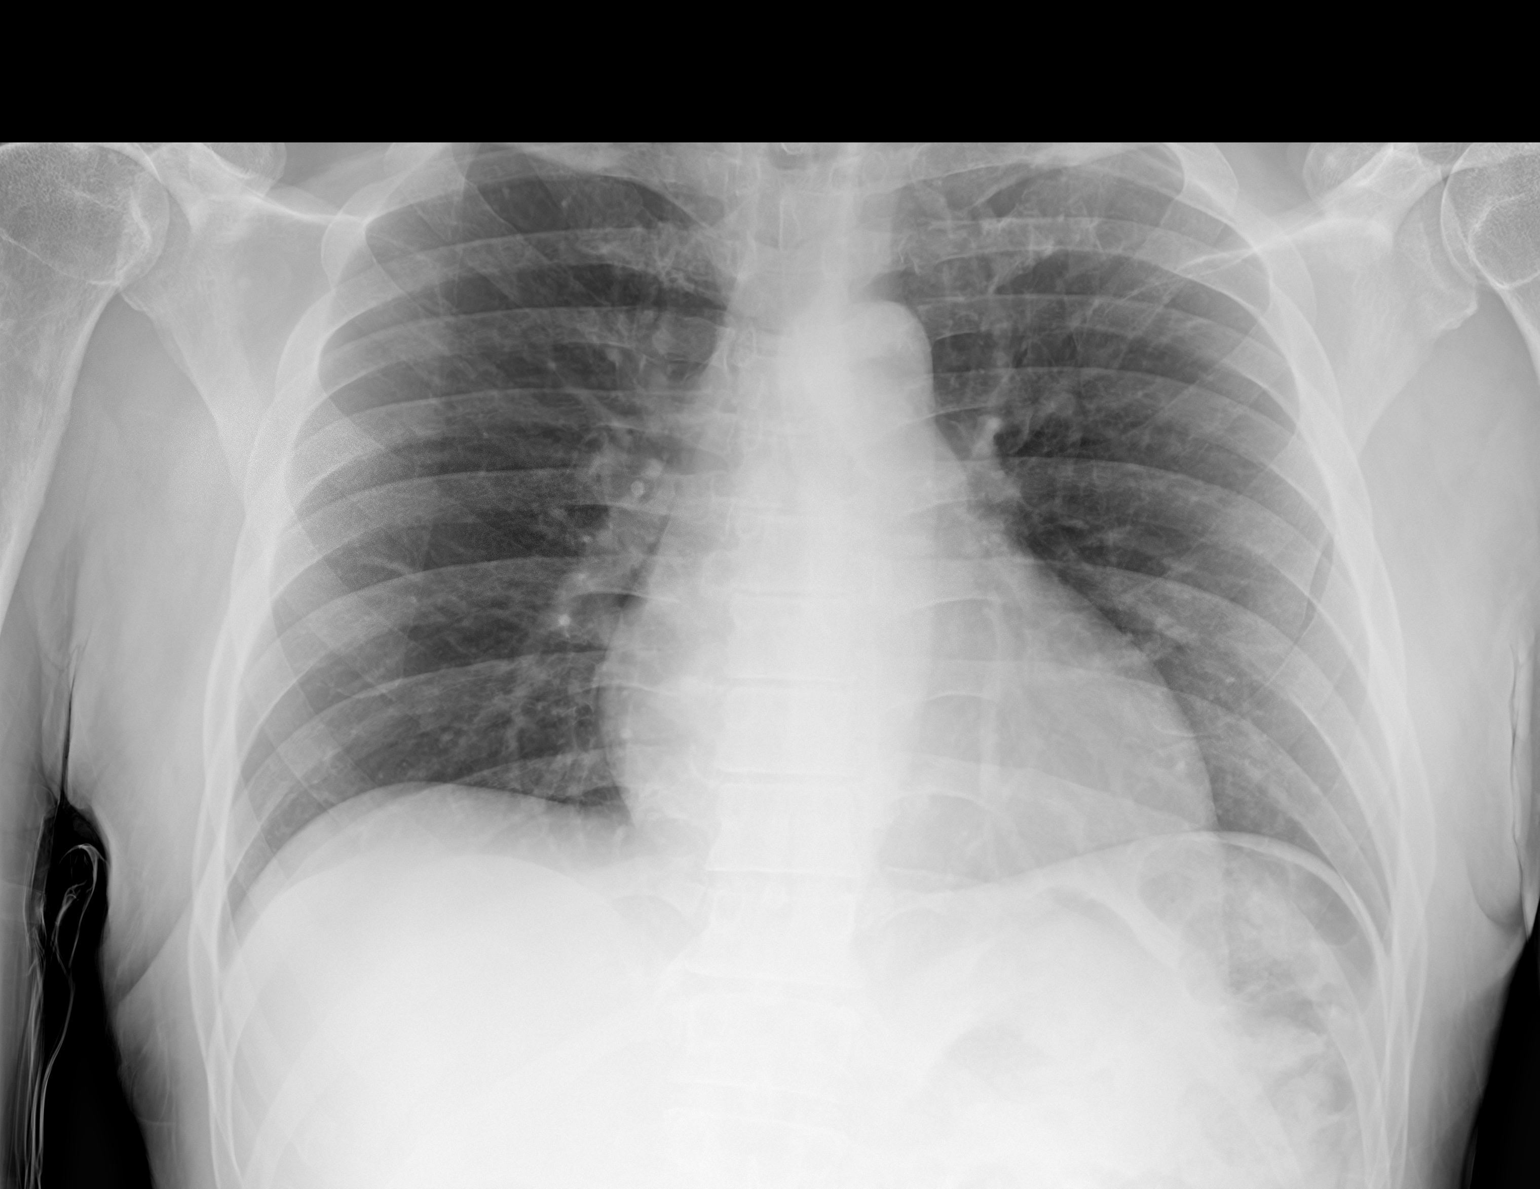

[2 of 2 positions shown; findings below may reference images not displayed]

FINDINGS: There is no edema or consolidation. Heart is upper normal in size
with pulmonary vascularity within normal limits. No adenopathy. No
bone lesions.
IMPRESSION: No edema or consolidation.

## 2016-08-21 ENCOUNTER — Emergency Department (HOSPITAL_COMMUNITY): Payer: Medicaid Other

## 2016-08-21 ENCOUNTER — Inpatient Hospital Stay (HOSPITAL_COMMUNITY)
Admission: EM | Admit: 2016-08-21 | Discharge: 2016-08-24 | DRG: 291 | Disposition: A | Discharge status: Skilled Nursing Facility | Payer: Medicaid Other | Attending: Family Medicine | Admitting: Family Medicine

## 2016-08-21 ENCOUNTER — Encounter (HOSPITAL_COMMUNITY): Payer: Self-pay

## 2016-08-21 DIAGNOSIS — J189 Pneumonia, unspecified organism: Secondary | ICD-10-CM | POA: Diagnosis present

## 2016-08-21 DIAGNOSIS — N189 Chronic kidney disease, unspecified: Secondary | ICD-10-CM

## 2016-08-21 DIAGNOSIS — D638 Anemia in other chronic diseases classified elsewhere: Secondary | ICD-10-CM | POA: Diagnosis present

## 2016-08-21 DIAGNOSIS — L89613 Pressure ulcer of right heel, stage 3: Secondary | ICD-10-CM | POA: Diagnosis not present

## 2016-08-21 DIAGNOSIS — Z9114 Patient's other noncompliance with medication regimen: Secondary | ICD-10-CM | POA: Diagnosis not present

## 2016-08-21 DIAGNOSIS — Z833 Family history of diabetes mellitus: Secondary | ICD-10-CM | POA: Diagnosis not present

## 2016-08-21 DIAGNOSIS — E1100 Type 2 diabetes mellitus with hyperosmolarity without nonketotic hyperglycemic-hyperosmolar coma (NKHHC): Secondary | ICD-10-CM | POA: Diagnosis not present

## 2016-08-21 DIAGNOSIS — F17210 Nicotine dependence, cigarettes, uncomplicated: Secondary | ICD-10-CM | POA: Diagnosis present

## 2016-08-21 DIAGNOSIS — Z823 Family history of stroke: Secondary | ICD-10-CM | POA: Diagnosis not present

## 2016-08-21 DIAGNOSIS — R0602 Shortness of breath: Secondary | ICD-10-CM | POA: Diagnosis present

## 2016-08-21 DIAGNOSIS — I5043 Acute on chronic combined systolic (congestive) and diastolic (congestive) heart failure: Secondary | ICD-10-CM | POA: Diagnosis present

## 2016-08-21 DIAGNOSIS — N179 Acute kidney failure, unspecified: Secondary | ICD-10-CM | POA: Diagnosis not present

## 2016-08-21 DIAGNOSIS — L89623 Pressure ulcer of left heel, stage 3: Secondary | ICD-10-CM | POA: Diagnosis present

## 2016-08-21 DIAGNOSIS — I509 Heart failure, unspecified: Secondary | ICD-10-CM

## 2016-08-21 DIAGNOSIS — Z794 Long term (current) use of insulin: Secondary | ICD-10-CM | POA: Diagnosis not present

## 2016-08-21 DIAGNOSIS — E11649 Type 2 diabetes mellitus with hypoglycemia without coma: Secondary | ICD-10-CM | POA: Diagnosis not present

## 2016-08-21 DIAGNOSIS — Z86718 Personal history of other venous thrombosis and embolism: Secondary | ICD-10-CM | POA: Diagnosis not present

## 2016-08-21 DIAGNOSIS — I5032 Chronic diastolic (congestive) heart failure: Secondary | ICD-10-CM | POA: Diagnosis not present

## 2016-08-21 DIAGNOSIS — E1122 Type 2 diabetes mellitus with diabetic chronic kidney disease: Secondary | ICD-10-CM | POA: Diagnosis not present

## 2016-08-21 DIAGNOSIS — I5021 Acute systolic (congestive) heart failure: Secondary | ICD-10-CM | POA: Diagnosis not present

## 2016-08-21 DIAGNOSIS — E118 Type 2 diabetes mellitus with unspecified complications: Secondary | ICD-10-CM | POA: Diagnosis not present

## 2016-08-21 DIAGNOSIS — F329 Major depressive disorder, single episode, unspecified: Secondary | ICD-10-CM | POA: Diagnosis present

## 2016-08-21 DIAGNOSIS — N183 Chronic kidney disease, stage 3 (moderate): Secondary | ICD-10-CM | POA: Diagnosis present

## 2016-08-21 DIAGNOSIS — E46 Unspecified protein-calorie malnutrition: Secondary | ICD-10-CM | POA: Diagnosis present

## 2016-08-21 DIAGNOSIS — I10 Essential (primary) hypertension: Secondary | ICD-10-CM | POA: Diagnosis present

## 2016-08-21 DIAGNOSIS — K219 Gastro-esophageal reflux disease without esophagitis: Secondary | ICD-10-CM | POA: Diagnosis present

## 2016-08-21 DIAGNOSIS — Y95 Nosocomial condition: Secondary | ICD-10-CM | POA: Diagnosis present

## 2016-08-21 DIAGNOSIS — F209 Schizophrenia, unspecified: Secondary | ICD-10-CM | POA: Diagnosis present

## 2016-08-21 DIAGNOSIS — I130 Hypertensive heart and chronic kidney disease with heart failure and stage 1 through stage 4 chronic kidney disease, or unspecified chronic kidney disease: Principal | ICD-10-CM | POA: Diagnosis present

## 2016-08-21 DIAGNOSIS — L8990 Pressure ulcer of unspecified site, unspecified stage: Secondary | ICD-10-CM | POA: Insufficient documentation

## 2016-08-21 DIAGNOSIS — R9431 Abnormal electrocardiogram [ECG] [EKG]: Secondary | ICD-10-CM | POA: Diagnosis present

## 2016-08-21 DIAGNOSIS — R0989 Other specified symptoms and signs involving the circulatory and respiratory systems: Secondary | ICD-10-CM

## 2016-08-21 DIAGNOSIS — S37009A Unspecified injury of unspecified kidney, initial encounter: Secondary | ICD-10-CM

## 2016-08-21 DIAGNOSIS — R9389 Abnormal findings on diagnostic imaging of other specified body structures: Secondary | ICD-10-CM | POA: Diagnosis present

## 2016-08-21 DIAGNOSIS — E785 Hyperlipidemia, unspecified: Secondary | ICD-10-CM | POA: Diagnosis present

## 2016-08-21 LAB — BASIC METABOLIC PANEL
ANION GAP: 7 (ref 5–15)
BUN: 46 mg/dL — ABNORMAL HIGH (ref 6–20)
CHLORIDE: 107 mmol/L (ref 101–111)
CO2: 24 mmol/L (ref 22–32)
Calcium: 9 mg/dL (ref 8.9–10.3)
Creatinine, Ser: 2.35 mg/dL — ABNORMAL HIGH (ref 0.61–1.24)
GFR calc Af Amer: 32 mL/min — ABNORMAL LOW (ref >60)
GFR, EST NON AFRICAN AMERICAN: 28 mL/min — AB (ref >60)
Glucose, Bld: 61 mg/dL — ABNORMAL LOW (ref 65–99)
POTASSIUM: 4.6 mmol/L (ref 3.5–5.1)
SODIUM: 138 mmol/L (ref 135–145)

## 2016-08-21 LAB — GLUCOSE, CAPILLARY
GLUCOSE-CAPILLARY: 158 mg/dL — AB (ref 65–99)
Glucose-Capillary: 367 mg/dL — ABNORMAL HIGH (ref 65–99)

## 2016-08-21 LAB — PROCALCITONIN: Procalcitonin: 3.07 ng/mL

## 2016-08-21 LAB — CBC
HCT: 23.9 % — ABNORMAL LOW (ref 39.0–52.0)
HEMOGLOBIN: 7.8 g/dL — AB (ref 13.0–17.0)
MCH: 30.1 pg (ref 26.0–34.0)
MCHC: 32.6 g/dL (ref 30.0–36.0)
MCV: 92.3 fL (ref 78.0–100.0)
PLATELETS: 210 10*3/uL (ref 150–400)
RBC: 2.59 MIL/uL — AB (ref 4.22–5.81)
RDW: 14.4 % (ref 11.5–15.5)
WBC: 9.3 10*3/uL (ref 4.0–10.5)

## 2016-08-21 LAB — CBG MONITORING, ED
GLUCOSE-CAPILLARY: 58 mg/dL — AB (ref 65–99)
GLUCOSE-CAPILLARY: 108 mg/dL — AB (ref 65–99)
Glucose-Capillary: 82 mg/dL (ref 65–99)

## 2016-08-21 LAB — INFLUENZA PANEL BY PCR (TYPE A & B)
H1N1 flu by pcr: NOT DETECTED
INFLAPCR: NEGATIVE
INFLBPCR: NEGATIVE

## 2016-08-21 LAB — BRAIN NATRIURETIC PEPTIDE: B NATRIURETIC PEPTIDE 5: 338 pg/mL — AB (ref 0.0–100.0)

## 2016-08-21 MED ORDER — FUROSEMIDE 10 MG/ML IJ SOLN
80.0000 mg | Freq: Once | INTRAMUSCULAR | Status: DC
  Filled 2016-08-21: qty 8

## 2016-08-21 MED ORDER — INSULIN ASPART 100 UNIT/ML ~~LOC~~ SOLN
0.0000 [IU] | Freq: Three times a day (TID) | SUBCUTANEOUS | Status: DC
  Administered 2016-08-21: 2 [IU] via SUBCUTANEOUS
  Administered 2016-08-22 (×2): 3 [IU] via SUBCUTANEOUS
  Administered 2016-08-22: 5 [IU] via SUBCUTANEOUS

## 2016-08-21 MED ORDER — PANTOPRAZOLE SODIUM 40 MG PO TBEC
40.0000 mg | DELAYED_RELEASE_TABLET | Freq: Every day | ORAL | Status: DC
Start: 2016-08-21 — End: 2016-08-24
  Administered 2016-08-21 – 2016-08-24 (×4): 40 mg via ORAL
  Filled 2016-08-21 (×4): qty 1

## 2016-08-21 MED ORDER — VANCOMYCIN HCL 10 G IV SOLR
1250.0000 mg | INTRAVENOUS | Status: DC
  Administered 2016-08-22 – 2016-08-24 (×3): 1250 mg via INTRAVENOUS
  Filled 2016-08-21 (×4): qty 1250

## 2016-08-21 MED ORDER — ISOSORBIDE MONONITRATE ER 30 MG PO TB24
30.0000 mg | ORAL_TABLET | Freq: Every day | ORAL | Status: DC
  Administered 2016-08-21 – 2016-08-24 (×4): 30 mg via ORAL
  Filled 2016-08-21 (×5): qty 1

## 2016-08-21 MED ORDER — FAMOTIDINE 20 MG PO TABS
10.0000 mg | ORAL_TABLET | Freq: Every day | ORAL | Status: DC
  Administered 2016-08-21 – 2016-08-24 (×4): 10 mg via ORAL
  Filled 2016-08-21 (×4): qty 1

## 2016-08-21 MED ORDER — FUROSEMIDE 10 MG/ML IJ SOLN
40.0000 mg | Freq: Once | INTRAMUSCULAR | Status: AC
  Administered 2016-08-21: 40 mg via INTRAVENOUS

## 2016-08-21 MED ORDER — FERROUS SULFATE 325 (65 FE) MG PO TABS
325.0000 mg | ORAL_TABLET | Freq: Every day | ORAL | Status: DC
  Administered 2016-08-22 – 2016-08-24 (×3): 325 mg via ORAL
  Filled 2016-08-21 (×4): qty 1

## 2016-08-21 MED ORDER — DEXTROSE 50 % IV SOLN
1.0000 | INTRAVENOUS | Status: DC | PRN
  Filled 2016-08-21: qty 50

## 2016-08-21 MED ORDER — LORAZEPAM 0.5 MG PO TABS
0.5000 mg | ORAL_TABLET | Freq: Two times a day (BID) | ORAL | Status: DC | PRN
  Administered 2016-08-22: 0.5 mg via ORAL
  Filled 2016-08-21: qty 1

## 2016-08-21 MED ORDER — DIVALPROEX SODIUM 125 MG PO CSDR
250.0000 mg | DELAYED_RELEASE_CAPSULE | Freq: Three times a day (TID) | ORAL | Status: DC
  Administered 2016-08-21 – 2016-08-24 (×10): 250 mg via ORAL
  Filled 2016-08-21 (×10): qty 2

## 2016-08-21 MED ORDER — DILTIAZEM HCL ER COATED BEADS 240 MG PO CP24
240.0000 mg | ORAL_CAPSULE | Freq: Every day | ORAL | Status: DC
  Administered 2016-08-21 – 2016-08-24 (×4): 240 mg via ORAL
  Filled 2016-08-21 (×4): qty 1

## 2016-08-21 MED ORDER — HEPARIN SODIUM (PORCINE) 5000 UNIT/ML IJ SOLN
5000.0000 [IU] | Freq: Three times a day (TID) | INTRAMUSCULAR | Status: DC
  Administered 2016-08-21 – 2016-08-24 (×8): 5000 [IU] via SUBCUTANEOUS
  Filled 2016-08-21 (×9): qty 1

## 2016-08-21 MED ORDER — VANCOMYCIN HCL IN DEXTROSE 1-5 GM/200ML-% IV SOLN
1000.0000 mg | Freq: Once | INTRAVENOUS | Status: AC
  Administered 2016-08-21: 1000 mg via INTRAVENOUS
  Filled 2016-08-21: qty 200

## 2016-08-21 MED ORDER — FUROSEMIDE 40 MG PO TABS
40.0000 mg | ORAL_TABLET | Freq: Two times a day (BID) | ORAL | Status: DC
  Administered 2016-08-21: 40 mg via ORAL
  Filled 2016-08-21: qty 1

## 2016-08-21 MED ORDER — DEXTROSE 5 % IV SOLN
1.0000 g | Freq: Once | INTRAVENOUS | Status: AC
  Administered 2016-08-21: 1 g via INTRAVENOUS
  Filled 2016-08-21: qty 1

## 2016-08-21 MED ORDER — HYDRALAZINE HCL 50 MG PO TABS
50.0000 mg | ORAL_TABLET | Freq: Three times a day (TID) | ORAL | Status: DC
  Administered 2016-08-21 – 2016-08-24 (×10): 50 mg via ORAL
  Filled 2016-08-21 (×10): qty 1

## 2016-08-21 MED ORDER — DEXTROSE 5 % IV SOLN: 1.0000 g | Freq: Three times a day (TID) | INTRAVENOUS | Status: DC

## 2016-08-21 MED ORDER — DEXTROSE 5 % IV SOLN
2.0000 g | INTRAVENOUS | Status: DC
  Administered 2016-08-21 – 2016-08-23 (×3): 2 g via INTRAVENOUS
  Filled 2016-08-21 (×4): qty 2

## 2016-08-21 MED ORDER — IPRATROPIUM-ALBUTEROL 0.5-2.5 (3) MG/3ML IN SOLN
3.0000 mL | RESPIRATORY_TRACT | Status: DC | PRN
Start: 2016-08-21 — End: 2016-08-24

## 2016-08-21 MED ORDER — HALOPERIDOL 1 MG PO TABS
1.0000 mg | ORAL_TABLET | Freq: Two times a day (BID) | ORAL | Status: DC
  Administered 2016-08-21 – 2016-08-24 (×6): 1 mg via ORAL
  Filled 2016-08-21 (×7): qty 1

## 2016-08-21 MED ORDER — HALOPERIDOL 1 MG PO TABS: 1.0000 mg | ORAL_TABLET | Freq: Two times a day (BID) | ORAL | Status: DC

## 2016-08-21 MED ORDER — GUAIFENESIN ER 600 MG PO TB12: 600.0000 mg | ORAL_TABLET | Freq: Two times a day (BID) | ORAL | Status: DC | PRN

## 2016-08-21 NOTE — ED Notes (Signed)
Phlebotomy at bedside.

## 2016-08-21 NOTE — ED Notes (Signed)
Dr. Liu at bedside 

## 2016-08-21 NOTE — ED Notes (Signed)
Pt given [place]

## 2016-08-21 NOTE — ED Notes (Signed)
RN attempt to get labs stuck once. Pt did not tolerate and told RN to get out of his room.

## 2016-08-21 NOTE — ED Notes (Signed)
Spoke with Silvio PateShelia from the facility. She states that she will call back later with the pts updated MAR.

## 2016-08-21 NOTE — ED Triage Notes (Signed)
Per [facility]: [facility] is [facility]. Pt has CHF hx and per pt he has not been taking his lasix, had chest x-ray yesterday and the facility believes that he has pneumonia. Pt has some swelling in arms and legs. 12 lead unremarkable. Staff member reported that the pts CBG was 28, they treated the pt with glucagon. With EMS the pts CBG was 83. Pts only complaint is of being tired. Pt was answering questions appropriately with EMS. With EMS lung sounds were clear. Per [facility] the staff stated that they wanted the pt transferred due to possible pneumonia due to his x-ray.

## 2016-08-21 NOTE — ED Provider Notes (Signed)
[facility]-EMERGENCY DEPT Provider Note   CSN: [id number] Arrival date & time: 08/21/16  9629  History   Chief Complaint Chief Complaint  Patient presents with  . Hypoglycemia  . Shortness of Breath    per facilty     HPI Clayton [name] is a 65 y.o. male.  HPI  65 y.o. male with a hx of CHF, CKD Stage III, HTN, DM2, presents to the Emergency Department today via EMS from [facility] nursing facility due to possible PNA on CXR done yesterday. Pt denies CP/SOB/ABD. No fevers. No N/V/D. Pt notes hx of CHF and he has not been taking his Lasix medication at the facility, due to forgetting to take it. Notes swelling in BUE and BLE. En route via EMS he was found to have a CBG around 58. Pt did not eat breakfast this morning. Pt notes being tired recently. No headaches. No blurred vision. No other symptoms noted.   Past Medical History:  Diagnosis Date  . Anemia   . CHF (congestive heart failure) (HCC)   . CKD (chronic kidney disease), stage III   . Depression   . DVT (deep venous thrombosis) (HCC)    LLE  . GERD (gastroesophageal reflux disease)   . HTN (hypertension)   . Medical non-compliance   . Migraine    "a few times/month" (12/06/2015)  . Pneumonia 12/06/2015  . Polysubstance abuse    cocaine  . Schizophrenia (HCC)   . Type II diabetes mellitus [facility])     Patient Active Problem List   Diagnosis Date Noted  . Acute renal insufficiency   . Dehydration 06/18/2016  . Nausea & vomiting 06/18/2016  . Schizophrenia (HCC) 06/18/2016  . Chronic diastolic heart failure, NYHA class 1 (HCC) 06/18/2016  . Acute kidney injury (HCC) 06/18/2016  . Essential hypertension   . AKI (acute kidney injury) (HCC) 06/09/2016  . Near syncope 06/09/2016  . Acute pulmonary edema (HCC) 01/09/2016  . Type 2 diabetes mellitus with hyperosmolar nonketotic hyperglycemia (HCC) 12/16/2015  . Prolonged Q-T interval on ECG 12/16/2015  . CKD (chronic kidney disease) 12/11/2015  . Diabetes mellitus with  complication (HCC) 12/11/2015  . Homeless 12/11/2015  . Anemia of chronic disease 12/11/2015  . Abnormal chest x-ray 12/06/2015  . Hyperglycemia 12/04/2015  . Hypertensive urgency 12/04/2015  . Cocaine abuse 10/08/2015  . Acute kidney injury superimposed on chronic kidney disease (HCC) 08/17/2015  . Accelerated hypertension   . History of Polysubstance abuse 12/13/2011  . Hyponatremia 12/01/2011    Past Surgical History:  Procedure Laterality Date  . NO PAST SURGERIES         Home Medications    Prior to Admission medications   Medication Sig Start Date End Date Taking? Authorizing Provider  diltiazem (CARTIA XT) 240 MG 24 hr capsule Take 1 capsule (240 mg total) by mouth daily. 01/15/16   Rolly Salter, MD  divalproex (DEPAKOTE SPRINKLE) 125 MG capsule Take 250 mg by mouth 2 (two) times daily.    Historical Provider, MD  ferrous sulfate 325 (65 FE) MG tablet Take 1 tablet (325 mg total) by mouth daily with breakfast. 12/13/15   Rolly Salter, MD  furosemide (LASIX) 40 MG tablet Take 1 tablet (40 mg total) by mouth 2 (two) times daily. 06/21/16   Barnetta Chapel, MD  guaiFENesin (MUCINEX) 600 MG 12 hr tablet Take 600 mg by mouth 2 (two) times daily as needed for cough or to loosen phlegm.    Historical Provider, MD  haloperidol (HALDOL) 1 MG tablet Take 1 mg by mouth 2 (two) times daily.    Historical Provider, MD  haloperidol lactate (HALDOL) 5 MG/ML injection Inject 1 mg into the muscle once.    Historical Provider, MD  hydrALAZINE (APRESOLINE) 50 MG tablet Take 1 tablet (50 mg total) by mouth 3 (three) times daily. 01/15/16   Rolly Salter, MD  insulin detemir (LEVEMIR) 100 UNIT/ML injection Inject 0.25 mLs (25 Units total) into the skin daily. 12/24/15   Shanker Levora Dredge, MD  insulin lispro (HUMALOG) 100 UNIT/ML injection Inject 2-10 Units into the skin 3 (three) times daily with meals. Per sliding scale: 201-250= 2 units; 251-300= 4 units; 301-350= 6 units; 351-400= 8 units; if  above 401= 10 units, recheck in 1 hours if still above 401 call MD.    Historical Provider, MD  ipratropium-albuterol (DUONEB) 0.5-2.5 (3) MG/3ML SOLN Take 3 mLs by nebulization every 4 (four) hours as needed. Patient taking differently: Take 3 mLs by nebulization every 4 (four) hours as needed (shortness of breath).  12/24/15   Shanker Levora Dredge, MD  isosorbide mononitrate (IMDUR) 30 MG 24 hr tablet Take 1 tablet (30 mg total) by mouth daily. 01/15/16   Rolly Salter, MD  LORazepam (ATIVAN) 0.5 MG tablet Take 0.5 mg by mouth 2 (two) times daily as needed for anxiety (breakthrough anxiety).    Historical Provider, MD  omeprazole (PRILOSEC) 20 MG capsule Take 20 mg by mouth daily at 6 (six) AM.     Historical Provider, MD  promethazine (PHENERGAN) 25 MG/ML injection Inject 25 mg into the muscle once.    Historical Provider, MD  ranitidine (ZANTAC) 150 MG tablet Take 150 mg by mouth 2 (two) times daily.    Historical Provider, MD  senna (SENOKOT) 8.6 MG tablet Take 2 tablets by mouth at bedtime.    Historical Provider, MD    Family History Family History  Problem Relation Age of Onset  . Diabetes Mellitus II Mother   . Diabetes Mellitus II Father   . Stroke Maternal Grandfather     Social History Social History  Substance Use Topics  . Smoking status: Current Every Day Smoker    Packs/day: 0.25    Years: 48.00    Types: Cigarettes  . Smokeless tobacco: Never Used  . Alcohol use Yes     Allergies   Latex   Review of Systems Review of Systems ROS reviewed and all are negative for acute change except as noted in the HPI.  Physical Exam Updated Vital Signs BP 148/85 (BP Location: Right Arm)   Pulse 78   Temp 97.3 F (36.3 C) (Oral)   Resp (!) 8   Ht 6\' 2"  (1.88 m)   Wt 86.2 kg   SpO2 98%   BMI 24.39 kg/m   Physical Exam  Constitutional: He is oriented to person, place, and time. Vital signs are normal. He appears well-developed and well-nourished.  HENT:  Head:  Normocephalic.  Right Ear: Hearing normal.  Left Ear: Hearing normal.  Eyes: Conjunctivae and EOM are normal. Pupils are equal, round, and reactive to light.  Neck: Normal range of motion. Neck supple. No tracheal deviation present.  Cardiovascular: Normal rate, regular rhythm, normal heart sounds and intact distal pulses.   Pulmonary/Chest: Effort normal and breath sounds normal. No respiratory distress. He has no wheezes. He has no rales. He exhibits no tenderness.  Abdominal: Soft. Bowel sounds are normal.  Musculoskeletal: Normal range of motion.  2+ pitting  edema noted BLE up to proximal tibia. 1+ pitting edema noted on BUE, but isolated on hands. NVI x 4 with distal pulses appreciated.   Neurological: He is alert and oriented to person, place, and time.  Skin: Skin is warm and dry.  Psychiatric: He has a normal mood and affect. His speech is normal and behavior is normal. Thought content normal.  Nursing note and vitals reviewed.  ED Treatments / Results  Labs (all labs ordered are listed, but only abnormal results are displayed) Labs Reviewed  CBC - Abnormal; Notable for the following:       Result Value   RBC 2.59 (*)    Hemoglobin 7.8 (*)    HCT 23.9 (*)    All other components within normal limits  BASIC METABOLIC PANEL - Abnormal; Notable for the following:    Glucose, Bld 61 (*)    BUN 46 (*)    Creatinine, Ser 2.35 (*)    GFR calc non Af Amer 28 (*)    GFR calc Af Amer 32 (*)    All other components within normal limits  CBG MONITORING, ED - Abnormal; Notable for the following:    Glucose-Capillary 58 (*)    All other components within normal limits  CULTURE, BLOOD (ROUTINE X 2)  CULTURE, BLOOD (ROUTINE X 2)  BRAIN NATRIURETIC PEPTIDE  CBG MONITORING, ED   EKG  EKG Interpretation  Date/Time:  Wednesday August 21 2016 09:39:11 EDT Ventricular Rate:  80 PR Interval:    QRS Duration: 157 QT Interval:  432 QTC Calculation: 499 R Axis:   130 Text  Interpretation:  Sinus rhythm IVCD, consider atypical RBBB Probable lateral infarct, age indeterminate Baseline wander in lead(s) V1 Similar to prior EKG  Confirmed by LIU MD, DANA [phone]) on 08/21/2016 9:43:09 AM Also confirmed by Verdie Mosher MD, DANA [phone]), editor [place] CT, Jola Babinski [phone])  on 08/21/2016 9:43:42 AM      Radiology Dg Chest 2 View  Result Date: 08/21/2016 CLINICAL DATA:  Cough and shortness of breath for 1 week, history hypertension, type II diabetes mellitus, chronic diastolic heart failure, GERD EXAM: CHEST  2 VIEW COMPARISON:  06/18/2016 FINDINGS: Enlargement of cardiac silhouette with pulmonary vascular congestion. RIGHT upper and RIGHT basilar infiltrates compatible with pneumonia. Minimal atelectasis versus scarring at medial LEFT lower lobe. No pneumothorax or gross pleural effusion. Bones unremarkable. IMPRESSION: Enlargement of cardiac silhouette with pulmonary vascular congestion. RIGHT upper lobe and RIGHT basilar infiltrates compatible with pneumonia. Atelectasis versus scarring at medial LEFT lower lobe unchanged. Electronically Signed   By: Ulyses Southward M.D.   On: 08/21/2016 10:21    Procedures Procedures (including critical care time)  Medications Ordered in ED Medications - No data to display   Initial Impression / Assessment and Plan / ED Course  I have reviewed the triage vital signs and the nursing notes.  Pertinent labs & imaging results that were available during my care of the patient were reviewed by me and considered in my medical decision making (see chart for details).  Clinical Course    Final Clinical Impressions(s) / ED Diagnoses  I have reviewed and evaluated the relevant laboratory values I have reviewed and evaluated the relevant imaging studies. I have reviewed the relevant previous healthcare records. I have reviewed EMS Documentation.I obtained HPI from historian. Patient discussed with supervising physician  ED Course:  Assessment: Pt is a  64yM with hx CHF, CKD Stage III, HTN, DM2 who presents with possible PNA. Sent from nursing facility.  On exam, pt in NAD. Nontoxic/nonseptic appearing. VSS. Oxygen sat 99% Afebrile. Lungs CTA. Heart RRR. Abdomen nontender soft. Pitting edema noted on BUE/BLE. No CP/SOB. Noted non compliance with Lasix. CBC baseline. No luekocytosis. BMP unremarkable. BNP pending. CXR with right sided PNA. Will treat for HCAP due to pt being in nursing facility. POC CBG found to be 58 on arrival. Given Orange Juice with repeat CBG in 80s. Given cefepime and Vanc in ED for HCAP. Given 80 IV lasix in ED.  Plan is to Admit to medicine.   Disposition/Plan:  Admit to Medicine  Supervising Physician Lavera Guiseana Duo Liu, MD   Final diagnoses:  HCAP (healthcare-associated pneumonia)  Pulmonary vascular congestion    New Prescriptions New Prescriptions   No medications on file     Audry Piliyler Kymani Laursen, PA-C 08/21/16 1237    Lavera Guiseana Duo Liu, MD 08/21/16 1753

## 2016-08-21 NOTE — ED Notes (Signed)
No lab draw, pt enroute to inpatient floor

## 2016-08-21 NOTE — ED Notes (Addendum)
Delay in administration of abx due to poor IV access and difficulty obtaining blood from pt.  Vancomycin and Maxipime are not compatible with each other.

## 2016-08-21 NOTE — ED Notes (Signed)
Patient transported to X-ray 

## 2016-08-21 NOTE — ED Notes (Signed)
cbg was 82

## 2016-08-21 NOTE — ED Notes (Signed)
Dr. Konrad DoloresMerrell gave pt another soda.

## 2016-08-21 NOTE — ED Notes (Signed)
Pt given 2 containers of orange juice.

## 2016-08-21 NOTE — ED Notes (Signed)
Admitting at bedside 

## 2016-08-21 NOTE — ED Provider Notes (Signed)
Medical screening examination/treatment/procedure(s) were conducted as a shared visit with non-physician practitioner(s) and myself.  I personally evaluated the patient during the encounter.   EKG Interpretation  Date/Time:  Wednesday August 21 2016 09:39:11 EDT Ventricular Rate:  80 PR Interval:    QRS Duration: 157 QT Interval:  432 QTC Calculation: 499 R Axis:   130 Text Interpretation:  Sinus rhythm IVCD, consider atypical RBBB Probable lateral infarct, age indeterminate Baseline wander in lead(s) V1 Similar to prior EKG  Confirmed by LIU MD, DANA [phone]) on 08/21/2016 9:43:09 AM Also confirmed by Verdie MosherLIU MD, DANA [phone]), editor Valentina LucksStout CT, Jola BabinskiMarilyn [phone])  on 08/21/2016 9:43:4742 AM      65 year old male who presents with cough and hypoglycemia. Patient is poor historian and unable to provide most of the history. History of CHF, stage III CKD, HTN, and DM. [facility] infiltrate on CXR yesterday. He endorses cough and sputum but no fever, chest pain or dyspnea. Missed doses of lasix recently as well, and facility notes some increased LE edema; although, he thinks this is stable. Afebrile and hemodynamoically stable, on room air with normal oxygenation. LE edema and appears mildly fluid overloaded. BNP mildly elevated. No significant leukocytosis. STable anemia. CXR with infiltrate c/o PNA and fluid with? Of edema. Admit to hospitalist service for HCAP. STarted on vancomycin and cefepime.    Lavera Guiseana Duo Liu, MD 08/21/16 1745

## 2016-08-21 NOTE — Progress Notes (Addendum)
Pharmacy Antibiotic Note  Clayton [name] is a 65 y.o. male admitted on 08/21/2016 with pneumonia.  Pharmacy has been consulted for vancomycin dosing. Patient presents with SOB.   Patient received cefepime 1g and vancomycin 1g IV once in the ED.  Plan: Vancomycin 1250mg  IV every 24 hours.  Goal trough 15-20 mcg/mL.  Cefepime 2g IV q24h Monitor culture data, renal function and clinical course VT at SS prn  Height: 6\' 2"  (188 cm) Weight: 190 lb (86.2 kg) IBW/kg (Calculated) : 82.2  Temp (24hrs), Avg:97.3 F (36.3 C), Min:97.3 F (36.3 C), Max:97.3 F (36.3 C)  No results for input(s): WBC, CREATININE, LATICACIDVEN, VANCOTROUGH, VANCOPEAK, VANCORANDOM, GENTTROUGH, GENTPEAK, GENTRANDOM, TOBRATROUGH, TOBRAPEAK, TOBRARND, AMIKACINPEAK, AMIKACINTROU, AMIKACIN in the last 168 hours.  CrCl cannot be calculated (Patient's most recent lab result is older than the maximum 21 days allowed.).    Allergies  Allergen Reactions  . Latex Rash    Antimicrobials this admission: Cefepime 9/13 >>  Vanc 9/13 >>   Dose adjustments this admission: n/a  Microbiology results:  BCx:   UCx:    Sputum:    MRSA PCR:    Arlean Hoppingorey M. Newman PiesBall, PharmD, BCPS Clinical Pharmacist Pager [phone] 08/21/2016 11:01 AM

## 2016-08-21 NOTE — ED Notes (Signed)
cbg was 58

## 2016-08-21 NOTE — ED Notes (Signed)
Dr. Merrell at bedside. 

## 2016-08-21 NOTE — H&P (Signed)
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  Patient is a poor historian and is minimally participatory on exam, unwilling to provide information and easily irritable     ED Course:  BP 173/93 (BP Location: Right Arm)   Pulse 79   Temp 97.3 F (36.3 C) (Oral)   Resp 13   Ht 6\' 2"  (1.88 m)   Wt 86.2 kg (190 lb)   SpO2 98%   BMI 24.39 kg/m   Received Vanco and Cefepime x1   sodium 139 potassium 4.3 Bicarb 27 creatinine 2.49 EGF R 26  Anion Gap 10 glucose 215 WBC 9.3 Hb 7.8, MCV 92.3, plts 210  BNP pending    Review of Systems: Unable to perform due to  decreased level of cooperation   Past Medical History:  Diagnosis Date  . Anemia   . CHF (congestive heart failure) (HCC)   . CKD (chronic kidney disease), stage III   . Depression   . DVT (deep venous thrombosis) (HCC)    LLE  . GERD (gastroesophageal reflux disease)   . HTN (hypertension)   . Medical non-compliance   . Migraine    "a few times/month" (12/06/2015)  . Pneumonia 12/06/2015  . Polysubstance abuse    cocaine  . Schizophrenia (HCC)   . Type II diabetes mellitus (HCC)     Past Surgical History:  Procedure Laterality Date  . NO PAST SURGERIES      Social History Social History   Social History  . Marital status: Single    Spouse name: N/A  . Number of children: N/A  . Years of education: N/A   Occupational History  . Not on file.   Social  History Main Topics  . Smoking status: Current Every Day Smoker    Packs/day: 0.25    Years: 48.00    Types: Cigarettes  . Smokeless tobacco: Never Used  . Alcohol use Yes  . Drug use:     Types: "Crack" cocaine, Marijuana     Comment: 12/06/2015 "last used crack 3-4 months ago"  . Sexual activity: Not on file   Other Topics Concern  . Not on file   Social History Narrative   Lives at [facility], shelter.      Allergies  Allergen Reactions  . Latex Rash    Family History  Problem Relation Age of Onset  . Diabetes Mellitus II Mother   . Diabetes Mellitus II Father   . Stroke Maternal Grandfather       Prior to Admission medications   Medication Sig Start Date End Date Taking? Authorizing Provider  diltiazem (CARTIA XT) 240 MG 24 hr capsule Take 1 capsule (240 mg total) by mouth daily. 01/15/16  Yes Rolly Salter, MD  divalproex (DEPAKOTE SPRINKLE) 125 MG capsule Take 250 mg by mouth 3 (three) times daily.    Yes Historical Provider, MD  furosemide (LASIX) 40 MG tablet Take 1 tablet (  40 mg total) by mouth 2 (two) times daily. 06/21/16  Yes Barnetta Chapel, MD  guaiFENesin (MUCINEX) 600 MG 12 hr tablet Take 600 mg by mouth 2 (two) times daily as needed for cough or to loosen phlegm.   Yes Historical Provider, MD  haloperidol (HALDOL) 1 MG tablet Take 1 mg by mouth 2 (two) times daily.   Yes Historical Provider, MD  haloperidol lactate (HALDOL) 5 MG/ML injection Inject 2 mg into the muscle once.    Yes Historical Provider, MD  hydrALAZINE (APRESOLINE) 50 MG tablet Take 1 tablet (50 mg total) by mouth 3 (three) times daily. 01/15/16  Yes Rolly Salter, MD  insulin detemir (LEVEMIR) 100 UNIT/ML injection Inject 0.25 mLs (25 Units total) into the skin daily. 12/24/15  Yes Shanker Levora Dredge, MD  insulin lispro (HUMALOG) 100 UNIT/ML injection Inject 2-10 Units into the skin 3 (three) times daily with meals. Per sliding scale: 201-250= 2 units; 251-300= 4 units;  301-350= 6 units; 351-400= 8 units; if above 401= 10 units, recheck in 1 hours if still above 401 call MD.   Yes Historical Provider, MD  ipratropium-albuterol (DUONEB) 0.5-2.5 (3) MG/3ML SOLN Take 3 mLs by nebulization every 4 (four) hours as needed. Patient taking differently: Take 3 mLs by nebulization every 4 (four) hours as needed (shortness of breath).  12/24/15  Yes Shanker Levora Dredge, MD  isosorbide mononitrate (IMDUR) 30 MG 24 hr tablet Take 1 tablet (30 mg total) by mouth daily. 01/15/16  Yes Rolly Salter, MD  LORazepam (ATIVAN) 0.5 MG tablet Take 0.5 mg by mouth 2 (two) times daily as needed for anxiety (breakthrough anxiety).   Yes Historical Provider, MD  omeprazole (PRILOSEC) 20 MG capsule Take 20 mg by mouth daily at 6 (six) AM.    Yes Historical Provider, MD  promethazine (PHENERGAN) 25 MG/ML injection Inject 25 mg into the muscle once.   Yes Historical Provider, MD  ranitidine (ZANTAC) 150 MG tablet Take 150 mg by mouth 2 (two) times daily.   Yes Historical Provider, MD  senna (SENOKOT) 8.6 MG tablet Take 2 tablets by mouth at bedtime.   Yes Historical Provider, MD  ferrous sulfate 325 (65 FE) MG tablet Take 1 tablet (325 mg total) by mouth daily with breakfast. 12/13/15   Rolly Salter, MD    Physical Exam:    Vitals:   08/21/16 1022 08/21/16 1023 08/21/16 1128 08/21/16 1218  BP: 163/94 163/94 153/90 173/93  Pulse: 79 80 81 79  Resp:  17 17 13   Temp:      TempSrc:      SpO2: 96% 95% 97% 98%  Weight:      Height:           Constitutional: NAD,easily irritable Vitals:   08/21/16 1022 08/21/16 1023 08/21/16 1128 08/21/16 1218  BP: 163/94 163/94 153/90 173/93  Pulse: 79 80 81 79  Resp:  17 17 13   Temp:      TempSrc:      SpO2: 96% 95% 97% 98%  Weight:      Height:       Eyes: PERRL, lids and conjunctivae normal ENMT: Mucous membranes are moist. Posterior pharynx clear of any exudate or lesions.Normal dentition.  Neck: normal, supple, no masses, no  thyromegaly Respiratory:  Decreased breath sounds on the right  no wheezing, no crackles. Normal respiratory effort. No accessory muscle use.  Cardiovascular: Regular rate and rhythm, no murmurs / rubs / gallops. No extremity edema. 2+ pedal  pulses. No carotid bruits.  Abdomen: no tenderness, no masses palpated. No hepatosplenomegaly. Bowel sounds positive.  Musculoskeletal: no clubbing / cyanosis. No joint deformity upper and lower extremities. Good ROM, no contractures. Normal muscle tone.  Skin: no rashes, lesions, ulcers. chrinic venous stasis changes in both lower extremities Neurologic: CN 2-12 grossly intact. Sensation intact, DTR normal. Strength 5/5 in all 4.      Labs on Admission: I have personally reviewed following labs and imaging studies  CBC:  Recent Labs Lab 08/21/16 1132  WBC 9.3  HGB 7.8*  HCT 23.9*  MCV 92.3  PLT 210    Basic Metabolic Panel:  Recent Labs Lab 08/21/16 1132  NA 138  K 4.6  CL 107  CO2 24  GLUCOSE 61*  BUN 46*  CREATININE 2.35*  CALCIUM 9.0    GFR: Estimated Creatinine Clearance: 37.9 mL/min (by C-G formula based on SCr of 2.35 mg/dL (H)).  Liver Function Tests: No results for input(s): AST, ALT, ALKPHOS, BILITOT, PROT, ALBUMIN in the last 168 hours. No results for input(s): LIPASE, AMYLASE in the last 168 hours. No results for input(s): AMMONIA in the last 168 hours.  Coagulation Profile: No results for input(s): INR, PROTIME in the last 168 hours.  Cardiac Enzymes: No results for input(s): CKTOTAL, CKMB, CKMBINDEX, TROPONINI in the last 168 hours.  BNP (last 3 results) No results for input(s): PROBNP in the last 8760 hours.  HbA1C: No results for input(s): HGBA1C in the last 72 hours.  CBG:  Recent Labs Lab 08/21/16 0915 08/21/16 1021  GLUCAP 58* 82    Lipid Profile: No results for input(s): CHOL, HDL, LDLCALC, TRIG, CHOLHDL, LDLDIRECT in the last 72 hours.  Thyroid Function Tests: No results for input(s):  TSH, T4TOTAL, FREET4, T3FREE, THYROIDAB in the last 72 hours.  Anemia Panel: No results for input(s): VITAMINB12, FOLATE, FERRITIN, TIBC, IRON, RETICCTPCT in the last 72 hours.  Urine analysis:    Component Value Date/Time   COLORURINE YELLOW 06/20/2016 1539   APPEARANCEUR CLEAR 06/20/2016 1539   LABSPEC 1.017 06/20/2016 1539   PHURINE 6.5 06/20/2016 1539   GLUCOSEU 250 (A) 06/20/2016 1539   HGBUR NEGATIVE 06/20/2016 1539   BILIRUBINUR NEGATIVE 06/20/2016 1539   KETONESUR NEGATIVE 06/20/2016 1539   PROTEINUR >300 (A) 06/20/2016 1539   UROBILINOGEN 0.2 10/08/2015 0634   NITRITE NEGATIVE 06/20/2016 1539   LEUKOCYTESUR TRACE (A) 06/20/2016 1539    Sepsis Labs: @LABRCNTIP (procalcitonin:4,lacticidven:4) )No results found for this or any previous visit (from the past 240 hour(s)).   Radiological Exams on Admission: Dg Chest 2 View  Result Date: 08/21/2016 CLINICAL DATA:  Cough and shortness of breath for 1 week, history hypertension, type II diabetes mellitus, chronic diastolic heart failure, GERD EXAM: CHEST  2 VIEW COMPARISON:  06/18/2016 FINDINGS: Enlargement of cardiac silhouette with pulmonary vascular congestion. RIGHT upper and RIGHT basilar infiltrates compatible with pneumonia. Minimal atelectasis versus scarring at medial LEFT lower lobe. No pneumothorax or gross pleural effusion. Bones unremarkable. IMPRESSION: Enlargement of cardiac silhouette with pulmonary vascular congestion. RIGHT upper lobe and RIGHT basilar infiltrates compatible with pneumonia. Atelectasis versus scarring at medial LEFT lower lobe unchanged. Electronically Signed   By: Ulyses Southward M.D.   On: 08/21/2016 10:21    EKG: Independently reviewed.  Assessment/Plan Active Problems:   Chronic diastolic heart failure, NYHA class 1 (HCC)   HCAP (healthcare-associated pneumonia)   Acute kidney injury superimposed on chronic kidney disease (HCC)   Abnormal chest x-ray   CKD (chronic kidney disease)  Diabetes  mellitus with complication (HCC)   Anemia of chronic disease   Type 2 diabetes mellitus with hyperosmolar nonketotic hyperglycemia (HCC)   Prolonged Q-T interval on ECG   Schizophrenia (HCC)   Essential hypertension    RLL HCAP found in CXR at his nursing home,  No respiratory distress  At this time Afebrile, VSS,. Received Vanco and Cefepime IV x1 at the ED  WBC 9.8  Admit to IP telemetry  -Await blood cultures, legionella antigen, strep antigen procalcitonin Sputum culture -Continue oxygen supplementation -Duonebs every 4 hours prn -Antibiotics per protocol Mucinex Respiratory therapy consult   Fluid overload in a patient with Chronic diastolic heart failure   Patient admits non-adherence to diuretic therapy.  last echocardiogram with normal LVF, EF 55-60Gr 1 DD.  Received Lasix 40 mg x1 and 40 mg orally at the ER Current weight 86.2 kg   EKG SR without sign changes from prior  Continue his home dose of LAsix at 40 mg bid and will continue to monitor - Daily weights, strict I/O   Hypertension BP 153/90 ( Pulse 81, poor compliance  Continue home anti-hypertensive medications      Acute on Chronic kidney disease stage 3 baseline creatinine  1.9-2 Lab Results  Component Value Date   CREATININE 2.35 (H) 08/21/2016   CREATININE 2.49 (H) 07/22/2016   CREATININE 1.94 (H) 06/21/2016   IVF Repeat CMET in am  Type II Diabetes Current blood sugar level is 58 (did not eat at nursing home prior to arrival), received some orange juice, slowly improving.  Lab Results  Component Value Date   HGBA1C 14.7 (H) 12/04/2015  Check A1C  SSI Heart healthy carb modified diet, and will continue to monitor with CBG q 2 h    Anemia, Hb, BL 10-11, suspect nutritional and of chronic disease. Current Hb 7.8, fluctuates around that value  No acutely bleeding, but will T+C, if symptomatic or changes in his Hb, will transfuse 2 units  Repeat CBC n am  GERD, no acute symptoms: Continue  PPI  Schizophrenia Continue Depakote  Deconditioning Consult PT/OT/Nutrition  DVT prophylaxis: Heparin  Code Status:   Full     Family Communication:  Discussed with patient   Disposition Plan: Expect patient to be discharged to nursing  home after condition improves. Will obtain a Social Work consult, as the patient is schizophrenic, in a nursing home, and he is orbital mental capacity is questionable at this time, unclear what is his level of decision making, and he is currently full gold. Needs to investigate, if there is any power of attorney any family member who can make decisions for him in the case  that this is needed. Consults called:   Social Work  Admission status: Tele obs  Marlowe KaysWERTMAN,Arionna Hoggard E, PA-C [facility]   If 7PM-7AM, please contact night-coverage [email or web address] Password [email or web address]  08/21/2016, 12:42 PM

## 2016-08-22 ENCOUNTER — Inpatient Hospital Stay (HOSPITAL_COMMUNITY): Payer: Medicaid Other

## 2016-08-22 DIAGNOSIS — I5021 Acute systolic (congestive) heart failure: Secondary | ICD-10-CM

## 2016-08-22 DIAGNOSIS — D638 Anemia in other chronic diseases classified elsewhere: Secondary | ICD-10-CM

## 2016-08-22 DIAGNOSIS — I5032 Chronic diastolic (congestive) heart failure: Secondary | ICD-10-CM

## 2016-08-22 LAB — CBC
HCT: 21 % — ABNORMAL LOW (ref 39.0–52.0)
Hemoglobin: 6.7 g/dL — CL (ref 13.0–17.0)
MCH: 29.4 pg (ref 26.0–34.0)
MCHC: 31.9 g/dL (ref 30.0–36.0)
MCV: 92.1 fL (ref 78.0–100.0)
PLATELETS: 199 10*3/uL (ref 150–400)
RBC: 2.28 MIL/uL — ABNORMAL LOW (ref 4.22–5.81)
RDW: 14.2 % (ref 11.5–15.5)
WBC: 6 10*3/uL (ref 4.0–10.5)

## 2016-08-22 LAB — GLUCOSE, CAPILLARY
GLUCOSE-CAPILLARY: 294 mg/dL — AB (ref 65–99)
GLUCOSE-CAPILLARY: 331 mg/dL — AB (ref 65–99)
Glucose-Capillary: 238 mg/dL — ABNORMAL HIGH (ref 65–99)
Glucose-Capillary: 216 mg/dL — ABNORMAL HIGH (ref 65–99)
Glucose-Capillary: 288 mg/dL — ABNORMAL HIGH (ref 65–99)
Glucose-Capillary: 343 mg/dL — ABNORMAL HIGH (ref 65–99)

## 2016-08-22 LAB — COMPREHENSIVE METABOLIC PANEL
ALBUMIN: 2.3 g/dL — AB (ref 3.5–5.0)
ALK PHOS: 61 U/L (ref 38–126)
ALT: 16 U/L — AB (ref 17–63)
AST: 14 U/L — AB (ref 15–41)
Anion gap: 7 (ref 5–15)
BUN: 47 mg/dL — AB (ref 6–20)
CALCIUM: 8.5 mg/dL — AB (ref 8.9–10.3)
CHLORIDE: 105 mmol/L (ref 101–111)
CO2: 25 mmol/L (ref 22–32)
CREATININE: 2.44 mg/dL — AB (ref 0.61–1.24)
GFR calc non Af Amer: 27 mL/min — ABNORMAL LOW (ref >60)
GFR, EST AFRICAN AMERICAN: 31 mL/min — AB (ref >60)
GLUCOSE: 263 mg/dL — AB (ref 65–99)
Potassium: 4.3 mmol/L (ref 3.5–5.1)
SODIUM: 137 mmol/L (ref 135–145)
Total Bilirubin: 0.5 mg/dL (ref 0.3–1.2)
Total Protein: 5.4 g/dL — ABNORMAL LOW (ref 6.5–8.1)

## 2016-08-22 LAB — RESPIRATORY PANEL BY PCR
Adenovirus: NOT DETECTED
Bordetella pertussis: NOT DETECTED
CHLAMYDOPHILA PNEUMONIAE-RVPPCR: NOT DETECTED
CORONAVIRUS OC43-RVPPCR: NOT DETECTED
Coronavirus 229E: NOT DETECTED
Coronavirus HKU1: NOT DETECTED
Coronavirus NL63: NOT DETECTED
INFLUENZA A-RVPPCR: NOT DETECTED
INFLUENZA B-RVPPCR: NOT DETECTED
MYCOPLASMA PNEUMONIAE-RVPPCR: NOT DETECTED
Metapneumovirus: NOT DETECTED
PARAINFLUENZA VIRUS 1-RVPPCR: NOT DETECTED
PARAINFLUENZA VIRUS 3-RVPPCR: NOT DETECTED
PARAINFLUENZA VIRUS 4-RVPPCR: NOT DETECTED
Parainfluenza Virus 2: NOT DETECTED
RESPIRATORY SYNCYTIAL VIRUS-RVPPCR: NOT DETECTED
RHINOVIRUS / ENTEROVIRUS - RVPPCR: NOT DETECTED

## 2016-08-22 LAB — HEMOGLOBIN AND HEMATOCRIT, BLOOD
HCT: 21.3 % — ABNORMAL LOW (ref 39.0–52.0)
Hemoglobin: 6.8 g/dL — CL (ref 13.0–17.0)

## 2016-08-22 LAB — MRSA PCR SCREENING: MRSA by PCR: NEGATIVE

## 2016-08-22 LAB — STREP PNEUMONIAE URINARY ANTIGEN: Strep Pneumo Urinary Antigen: NEGATIVE

## 2016-08-22 LAB — PREPARE RBC (CROSSMATCH)

## 2016-08-22 MED ORDER — HALOPERIDOL LACTATE 5 MG/ML IJ SOLN: 1.0000 mg | Freq: Once | INTRAMUSCULAR | Status: AC | PRN

## 2016-08-22 MED ORDER — FUROSEMIDE 10 MG/ML IJ SOLN
40.0000 mg | Freq: Two times a day (BID) | INTRAMUSCULAR | Status: DC
Start: 2016-08-22 — End: 2016-08-24
  Administered 2016-08-22 – 2016-08-24 (×5): 40 mg via INTRAVENOUS
  Filled 2016-08-22 (×5): qty 4

## 2016-08-22 MED ORDER — INSULIN ASPART 100 UNIT/ML ~~LOC~~ SOLN
0.0000 [IU] | Freq: Every day | SUBCUTANEOUS | Status: DC
  Administered 2016-08-22: 4 [IU] via SUBCUTANEOUS
  Administered 2016-08-23: 3 [IU] via SUBCUTANEOUS

## 2016-08-22 MED ORDER — INSULIN ASPART 100 UNIT/ML ~~LOC~~ SOLN
0.0000 [IU] | Freq: Three times a day (TID) | SUBCUTANEOUS | Status: DC
  Administered 2016-08-23: 5 [IU] via SUBCUTANEOUS
  Administered 2016-08-23: 3 [IU] via SUBCUTANEOUS
  Administered 2016-08-23: 5 [IU] via SUBCUTANEOUS
  Administered 2016-08-24: 3 [IU] via SUBCUTANEOUS
  Administered 2016-08-24: 7 [IU] via SUBCUTANEOUS

## 2016-08-22 MED ORDER — SODIUM CHLORIDE 0.9 % IV SOLN
Freq: Once | INTRAVENOUS | Status: AC
  Administered 2016-08-22: 16:00:00 via INTRAVENOUS

## 2016-08-22 NOTE — Evaluation (Signed)
Physical Therapy Evaluation Patient Details Name: Clayton [name] MRN: [id number] DOB: [birth year] Today's Date: 08/22/2016   History of Present Illness  Clayton [name] is a 65 y.o. male with a Past Medical History of anemia, chf, ckd, schizophrenia, dvt, GERD, HTN, HA, DM,  who presents with likely HCAP and CHF exacerbation.   Clinical Impression  Pt admitted with above diagnosis. Pt currently with functional limitations due to the deficits listed below (see PT Problem List).  Pt will benefit from skilled PT to increase their independence and safety with mobility to allow discharge to the venue listed below.  Pt with low Hgb, but no reports of dizziness or lightheadedness.  Pt with flat affect and staring into space.  Recommend rehab when he returns to SNF as on last admission, pt was ambulating 100' and today and required TOT A of 2 to transfer to chair.     Follow Up Recommendations SNF    Equipment Recommendations  None recommended by PT    Recommendations for Other Services       Precautions / Restrictions Precautions Precautions: Fall Restrictions Weight Bearing Restrictions: No      Mobility  Bed Mobility Overal bed mobility: Needs Assistance;+2 for physical assistance Bed Mobility: Supine to Sit     Supine to sit: +2 for physical assistance;Max assist     General bed mobility comments: Pt A with moving legs to side, but needed MAX of 2 to achieve sitting  Transfers Overall transfer level: Needs assistance   Transfers: Lateral/Scoot Transfers          Lateral/Scoot Transfers: Total assist;+2 physical assistance General transfer comment: Lateral scoot transfer with TOT of 2.  Pt not attempting to A in any way, despite cues to push up and A with scooting with UE.   Ambulation/Gait                Stairs            Wheelchair Mobility    Modified Rankin (Stroke Patients Only)       Balance Overall balance assessment: Needs assistance    Sitting balance-Leahy Scale: Fair       Standing balance-Leahy Scale: Zero                               Pertinent Vitals/Pain Pain Assessment: No/denies pain    Home Living Family/patient expects to be discharged to:: Skilled nursing facility                      Prior Function Level of Independence: Independent with assistive device(s)         Comments: Amb with RW. Reports he is I with transfers     Hand Dominance   Dominant Hand: Right    Extremity/Trunk Assessment   Upper Extremity Assessment: Defer to OT evaluation;Generalized weakness (tends to just hold his UE in slightly flexed position and not use with any mobility)           Lower Extremity Assessment: Generalized weakness;RLE deficits/detail;LLE deficits/detail RLE Deficits / Details: Swelling noted with open R heel sore- nursing informed LLE Deficits / Details: swelling with dressing on L heel     Communication   Communication: No difficulties  Cognition Arousal/Alertness: Lethargic Behavior During Therapy: Flat affect Overall Cognitive Status: No family/caregiver present to determine baseline cognitive functioning (nurse reports he was more conversant yesterday)  General Comments General comments (skin integrity, edema, etc.): open wound R heel, dressing on L heel, swelling noted throughout UE and LE    Exercises        Assessment/Plan    PT Assessment Patient needs continued PT services  PT Diagnosis Generalized weakness   PT Problem List Decreased strength;Decreased activity tolerance;Decreased balance;Decreased mobility;Decreased knowledge of use of DME;Decreased safety awareness;Decreased cognition  PT Treatment Interventions DME instruction;Gait training;Functional mobility training;Balance training;Therapeutic exercise;Therapeutic activities;Patient/family education   PT Goals (Current goals can be found in the Care Plan section)  Acute Rehab PT Goals Patient Stated Goal: pt unable to state goals.  Staring into space. PT Goal Formulation: Patient unable to participate in goal setting Time For Goal Achievement: 09/05/16 Potential to Achieve Goals: Fair    Frequency Min 2X/week   Barriers to discharge        Co-evaluation               End of Session Equipment Utilized During Treatment: Gait belt Activity Tolerance: Patient limited by fatigue;Patient limited by lethargy Patient left: in chair;with call bell/phone within reach;with bed alarm set;with nursing/sitter in room Nurse Communication: Mobility status;Need for lift equipment         Time: 1132-1159 PT Time Calculation (min) (ACUTE ONLY): 27 min   Charges:   PT Evaluation $PT Eval Moderate Complexity: 1 Procedure PT Treatments $Therapeutic Activity: 8-22 mins   PT G Codes:        Rosemond Lyttle LUBECK 08/22/2016, 12:13 PM

## 2016-08-22 NOTE — Progress Notes (Signed)
CRITICAL VALUE ALERT  Critical value received:  Hgb  Date of notification:  08/22/16  Time of notification:  0815  Critical value read back:Yes.    Nurse who received alert:  Wynona NeatJessica Jase Reep  MD notified (1st page):  Nettey  Time of first page:  [phone]  MD notified (2nd page):  Time of second page:  Responding MD:  Mal MistyNetty  Time MD responded:  [phone]

## 2016-08-22 NOTE — Progress Notes (Signed)
PROGRESS NOTE    Clayton [name] L Baylock  ZOX:[id number] DOB: Sep 10, 1951 DOA: 08/21/2016 PCP: Georgann HousekeeperHUSAIN,KARRAR, MD   Brief Narrative: Clayton [name] L [name] is a 65 y.o. with a history of hypertension, hyperlipidemia, CKD, combined systolic and diastolic heart failure, diabetes mellitus type 2, depression, schizophrenia, GERD, recurrent pneumonia. He presented to the emergency department with dyspnea and was found to have a right-sided pneumonia.   Assessment & Plan:   Active Problems:   Acute kidney injury superimposed on chronic kidney disease (HCC)   Abnormal chest x-ray   CKD (chronic kidney disease)   Diabetes mellitus with complication (HCC)   Anemia of chronic disease   Type 2 diabetes mellitus with hyperosmolar nonketotic hyperglycemia (HCC)   Prolonged Q-T interval on ECG   Schizophrenia (HCC)   Chronic diastolic heart failure, NYHA class 1 (HCC)   Essential hypertension   HCAP (healthcare-associated pneumonia)   Acute systolic congestive heart failure (HCC)   Right upper and basilar healthcare associated pneumonia Patient is stable. Afebrile. Urine Strep is negative. Blood cultures pending. Procalcitonin elevated at 3.07. Patient on room air -Continue vancomycin IV and cefepime IV -Follow-up Legionella -Follow-up blood cultures  Acute on chronic diastolic heart failure Electrocardiogram significant for EF of 55-60% with diastolic dysfunction -Daily weights -Strict I/O -Continue Lasix 40 mg twice a day by mouth  Hypertension -Continue diltiazem 240 mg daily -Continue Imdur 30 mg daily  Acute on chronic kidney disease, stage III Possibly secondary to acute heart failure exacerbation -Repeat BMP in the morning  Anemia Baseline appears to be 8 and 9. He was 7.8 on admission and has dropped down to 6.7 and is stable at 6.8. With his heart failure, would like to be about 8 -Transfuse 1 unit of blood, repeat H&H after transfusion -CBC in the morning  Type 2 diabetes -Follow  up A1c -Continue sliding scale insulin   GERD -Continue PPI  Schizophrenia -Continue Depakote  Deconditioning Malnutrition Follow-up PT, OT, nutrition recommendations  DVT prophylaxis: Heparin Code Status: Full code Family Communication: None at bedside Disposition Plan: Plans for discharge to SNF when medically stable in 1-2 days   Consultants:   Physical therapy   Occupational therapy  Nutrition  Procedures:  None   Antimicrobials:  Vancomycin (9/14>>  Cefepime (9/14>>   Subjective: Patient reports no problems overnight.   Objective: Vitals:   08/22/16 0754 08/22/16 0901 08/22/16 1003 08/22/16 1150  BP: 131/63 (!) 143/75 (!) 143/75 139/72  Pulse: 92 86 92 87  Resp: 18     Temp: 98.9 F (37.2 C)   98.5 F (36.9 C)  TempSrc: Oral   Oral  SpO2: 91% 95% 96% 94%  Weight:      Height:        Intake/Output Summary (Last 24 hours) at 08/22/16 1424 Last data filed at 08/22/16 1358  Gross per 24 hour  Intake              526 ml  Output             1300 ml  Net             -774 ml   Filed Weights   08/21/16 0915 08/22/16 0408  Weight: 86.2 kg (190 lb) 88.3 kg (194 lb 11.2 oz)    Examination:  General exam: Appears calm and comfortable Respiratory system: Clear to auscultation. Respiratory effort normal. Cardiovascular system: S1 & S2 heard, RRR. No murmurs, rubs, gallops or clicks. Gastrointestinal system: Abdomen is nondistended, soft and nontender. Normal  bowel sounds heard. Central nervous system: Alert and oriented. No focal neurological deficits. Extremities: No edema. No calf tenderness Skin: No cyanosis. No rashes Psychiatry: Judgement and insight appear normal. Mood & affect appropriate.     Data Reviewed: I have personally reviewed following labs and imaging studies  CBC:  Recent Labs Lab 08/21/16 1132 08/22/16 0728 08/22/16 0846  WBC 9.3 6.0  --   HGB 7.8* 6.7* 6.8*  HCT 23.9* 21.0* 21.3*  MCV 92.3 92.1  --   PLT 210 199   --    Basic Metabolic Panel:  Recent Labs Lab 08/21/16 1132 08/22/16 0728  NA 138 137  K 4.6 4.3  CL 107 105  CO2 24 25  GLUCOSE 61* 263*  BUN 46* 47*  CREATININE 2.35* 2.44*  CALCIUM 9.0 8.5*   GFR: Estimated Creatinine Clearance: 36.5 mL/min (by C-G formula based on SCr of 2.44 mg/dL (H)). Liver Function Tests:  Recent Labs Lab 08/22/16 0728  AST 14*  ALT 16*  ALKPHOS 61  BILITOT 0.5  PROT 5.4*  ALBUMIN 2.3*   No results for input(s): LIPASE, AMYLASE in the last 168 hours. No results for input(s): AMMONIA in the last 168 hours. Coagulation Profile: No results for input(s): INR, PROTIME in the last 168 hours. Cardiac Enzymes: No results for input(s): CKTOTAL, CKMB, CKMBINDEX, TROPONINI in the last 168 hours. BNP (last 3 results) No results for input(s): PROBNP in the last 8760 hours. HbA1C: No results for input(s): HGBA1C in the last 72 hours. CBG:  Recent Labs Lab 08/21/16 2155 08/22/16 0045 08/22/16 0423 08/22/16 0754 08/22/16 1140  GLUCAP 367* 331* 294* 238* 216*   Lipid Profile: No results for input(s): CHOL, HDL, LDLCALC, TRIG, CHOLHDL, LDLDIRECT in the last 72 hours. Thyroid Function Tests: No results for input(s): TSH, T4TOTAL, FREET4, T3FREE, THYROIDAB in the last 72 hours. Anemia Panel: No results for input(s): VITAMINB12, FOLATE, FERRITIN, TIBC, IRON, RETICCTPCT in the last 72 hours. Sepsis Labs:  Recent Labs Lab 08/21/16 1921  PROCALCITON 3.07    Recent Results (from the past 240 hour(s))  MRSA PCR Screening     Status: None   Collection Time: 08/22/16  7:53 AM  Result Value Ref Range Status   MRSA by PCR NEGATIVE NEGATIVE Final    Comment:        The GeneXpert MRSA Assay (FDA approved for NASAL specimens only), is one component of a comprehensive MRSA colonization surveillance program. It is not intended to diagnose MRSA infection nor to guide or monitor treatment for MRSA infections.          Radiology Studies: Dg  Chest 2 View  Result Date: 08/22/2016 CLINICAL DATA:  Shortness of breath cough, healthcare associated pneumonia. Acute on chronic renal insufficiency, diabetes, acute and. Chronic CHF EXAM: CHEST  2 VIEW COMPARISON:  PA and lateral chest x-ray of August 21, 2016 FINDINGS: The lungs are borderline hypoinflated. Alveolar opacity persists in the right upper and lower lobe. Interstitial prominence persists elsewhere in the right lung and throughout the left lung. There is no significant pleural effusion. The cardiac silhouette is enlarged. The pulmonary vascularity is mildly prominent. IMPRESSION: Bilateral pneumonia most conspicuous on the right, not greatly changed since yesterday's study. Stable cardiomegaly with mild central pulmonary vascular congestion. Electronically Signed   By: David  [place] M.D.   On: 08/22/2016 07:43   Dg Chest 2 View  Result Date: 08/21/2016 CLINICAL DATA:  Cough and shortness of breath for 1 week, history hypertension, type II diabetes mellitus,  chronic diastolic heart failure, GERD EXAM: CHEST  2 VIEW COMPARISON:  06/18/2016 FINDINGS: Enlargement of cardiac silhouette with pulmonary vascular congestion. RIGHT upper and RIGHT basilar infiltrates compatible with pneumonia. Minimal atelectasis versus scarring at medial LEFT lower lobe. No pneumothorax or gross pleural effusion. Bones unremarkable. IMPRESSION: Enlargement of cardiac silhouette with pulmonary vascular congestion. RIGHT upper lobe and RIGHT basilar infiltrates compatible with pneumonia. Atelectasis versus scarring at medial LEFT lower lobe unchanged. Electronically Signed   By: Ulyses Southward M.D.   On: 08/21/2016 10:21        Scheduled Meds: . sodium chloride   Intravenous Once  . ceFEPime (MAXIPIME) IV  2 g Intravenous Q24H  . diltiazem  240 mg Oral Daily  . divalproex  250 mg Oral TID  . famotidine  10 mg Oral Daily  . ferrous sulfate  325 mg Oral Q breakfast  . furosemide  40 mg Intravenous BID  .  haloperidol  1 mg Oral BID  . heparin  5,000 Units Subcutaneous Q8H  . hydrALAZINE  50 mg Oral TID  . insulin aspart  0-9 Units Subcutaneous TID WC  . isosorbide mononitrate  30 mg Oral Daily  . pantoprazole  40 mg Oral Daily  . vancomycin  1,250 mg Intravenous Q24H   Continuous Infusions:    LOS: 1 day     Jacquelin Hawking [facility] 08/22/2016, 2:24 PM Pager: [phone]  If 7PM-7AM, please contact night-coverage [email or web address] Password [facility] 08/22/2016, 2:24 PM

## 2016-08-22 NOTE — NC FL2 (Signed)
[place] MEDICAID FL2 LEVEL OF CARE SCREENING TOOL     IDENTIFICATION  Patient Name: Clayton [name] Birthdate: [birth year] Sex: male Admission Date (Current Location): 08/21/2016  [facility] and [place] Number:  Producer, television/film/video and Address:  [facility] [place], [address], [place], [place] [address]      Provider Number: [id number]  Attending Physician Name and Address:  Narda Bonds, MD  Relative Name and Phone Number:       Current Level of Care: SNF Recommended Level of Care: Skilled Nursing Facility Prior Approval Number:    Date Approved/Denied:   PASRR Number: [id number]  Discharge Plan: SNF    Current Diagnoses: Patient Active Problem List   Diagnosis Date Noted  . Acute systolic congestive heart failure (HCC)   . HCAP (healthcare-associated pneumonia) 08/21/2016  . Acute renal insufficiency   . Dehydration 06/18/2016  . Nausea & vomiting 06/18/2016  . Schizophrenia (HCC) 06/18/2016  . Chronic diastolic heart failure, NYHA class 1 (HCC) 06/18/2016  . Acute kidney injury (HCC) 06/18/2016  . Essential hypertension   . AKI (acute kidney injury) (HCC) 06/09/2016  . Near syncope 06/09/2016  . Acute pulmonary edema (HCC) 01/09/2016  . Type 2 diabetes mellitus with hyperosmolar nonketotic hyperglycemia (HCC) 12/16/2015  . Prolonged Q-T interval on ECG 12/16/2015  . CKD (chronic kidney disease) 12/11/2015  . Diabetes mellitus with complication (HCC) 12/11/2015  . Homeless 12/11/2015  . Anemia of chronic disease 12/11/2015  . Abnormal chest x-ray 12/06/2015  . Hyperglycemia 12/04/2015  . Hypertensive urgency 12/04/2015  . Cocaine abuse 10/08/2015  . Acute kidney injury superimposed on chronic kidney disease (HCC) 08/17/2015  . Accelerated hypertension   . History of Polysubstance abuse 12/13/2011  . Hyponatremia 12/01/2011    Orientation RESPIRATION BLADDER Height & Weight     Self  Normal Incontinent Weight: 194 lb 11.2 oz  (88.3 kg) Height:  6\' 2"  (188 cm)  BEHAVIORAL SYMPTOMS/MOOD NEUROLOGICAL BOWEL NUTRITION STATUS   (None)  (None) Continent Diet (Heart healthy/carb modified)  AMBULATORY STATUS COMMUNICATION OF NEEDS Skin   Extensive Assist Verbally PU Stage and Appropriate Care     PU Stage 3 Dressing:  (Right heel)                 Personal Care Assistance Level of Assistance  Bathing, Feeding, Dressing Bathing Assistance: Limited assistance Feeding assistance: Independent Dressing Assistance: Limited assistance     Functional Limitations Info  Hearing, Sight, Speech Sight Info: Adequate Hearing Info: Adequate Speech Info: Adequate    SPECIAL CARE FACTORS FREQUENCY  Blood pressure, Diabetic urine testing, PT (By licensed PT), OT (By licensed OT)     PT Frequency: 5 x week OT Frequency: 5 x week            Contractures Contractures Info: Not present    Additional Factors Info  Code Status, Allergies, Psychotropic, Isolation Precautions Code Status Info: Full Allergies Info: Latex Psychotropic Info: Schizophrenia: Depakote sprinkle 250 mg PO TID, Haldol 1 mg PO BID   Isolation Precautions Info: Droplet     Current Medications (08/22/2016):  This is the current hospital active medication list Current Facility-Administered Medications  Medication Dose Route Frequency Provider Last Rate Last Dose  . 0.9 %  sodium chloride infusion   Intravenous Once Narda Bonds, MD      . ceFEPIme (MAXIPIME) 2 g in dextrose 5 % 50 mL IVPB  2 g Intravenous Q24H Marquita Palms, [facility]   2  g at 08/21/16 2351  . dextrose 50 % solution 50 mL  1 ampule Intravenous PRN Ozella Rocksavid J Merrell, MD      . diltiazem [facility](CARDIZEM CD) 24 hr capsule 240 mg  240 mg Oral Daily Marcos EkeSara E Wertman, PA-C   240 mg at 08/22/16 1015  . divalproex (DEPAKOTE SPRINKLE) capsule 250 mg  250 mg Oral TID Marcos EkeSara E Wertman, PA-C   250 mg at 08/22/16 1019  . famotidine (PEPCID) tablet 10 mg  10 mg Oral Daily Marcos EkeSara E Wertman, PA-C   10 mg at 08/22/16  1017  . ferrous sulfate tablet 325 mg  325 mg Oral Q breakfast Marcos EkeSara E Wertman, PA-C   325 mg at 08/22/16 1019  . furosemide (LASIX) injection 40 mg  40 mg Intravenous BID Ozella Rocksavid J Merrell, MD   40 mg at 08/22/16 1015  . guaiFENesin (MUCINEX) 12 hr tablet 600 mg  600 mg Oral BID PRN Marcos EkeSara E Wertman, PA-C      . haloperidol (HALDOL) tablet 1 mg  1 mg Oral BID Marcos EkeSara E Wertman, PA-C   1 mg at 08/22/16 1019  . heparin injection 5,000 Units  5,000 Units Subcutaneous Q8H Marcos EkeSara E Wertman, PA-C   5,000 Units at 08/22/16 1403  . hydrALAZINE (APRESOLINE) tablet 50 mg  50 mg Oral TID Marcos EkeSara E Wertman, PA-C   50 mg at 08/22/16 1019  . insulin aspart (novoLOG) injection 0-9 Units  0-9 Units Subcutaneous TID WC Marcos EkeSara E Wertman, PA-C   3 Units at 08/22/16 1219  . ipratropium-albuterol (DUONEB) 0.5-2.5 (3) MG/3ML nebulizer solution 3 mL  3 mL Nebulization Q4H PRN Marcos EkeSara E Wertman, PA-C      . isosorbide mononitrate (IMDUR) 24 hr tablet 30 mg  30 mg Oral Daily Marcos EkeSara E Wertman, PA-C   30 mg at 08/22/16 1019  . LORazepam (ATIVAN) tablet 0.5 mg  0.5 mg Oral BID PRN Marcos EkeSara E Wertman, PA-C      . pantoprazole (PROTONIX) EC tablet 40 mg  40 mg Oral Daily Marcos EkeSara E Wertman, PA-C   40 mg at 08/22/16 1018  . vancomycin (VANCOCIN) 1,250 mg in sodium chloride 0.9 % 250 mL IVPB  1,250 mg Intravenous Q24H Marquita PalmsCorey M Ball, RPH   1,250 mg at 08/22/16 0730     Discharge Medications: Please see discharge summary for a list of discharge medications.  Relevant Imaging Results:  Relevant Lab Results:   Additional Information SS#: [id number]  Margarito LinerSarah C Darrious Youman, LCSW

## 2016-08-22 NOTE — Progress Notes (Addendum)
Pt has been agitated and refused VS until 1850. Blood was stopped at 1812. VS documented in epic.

## 2016-08-22 NOTE — Progress Notes (Signed)
Results for [name], [name] (MRN [id number]) as of 08/22/2016 11:11  Ref. Range 08/21/2016 16:37 08/21/2016 21:55 08/22/2016 00:45 08/22/2016 04:23 08/22/2016 07:54  Glucose-Capillary Latest Ref Range: 65 - 99 mg/dL 409158 (H) 811367 (H) 914331 (H) 294 (H) 238 (H)  CBGs have been greater than 300 mg/dl.  Recommend starting Levemir 15 units daily.  Patient takes Levemir 25 units daily at home, but came in with hypoglycemia. Continue Novolog SENSITIVE correction scale TID as ordered. Will continue to monitor blood sugars while in the hospital. Smith MinceKendra Miguel Medal RN BSN CDE

## 2016-08-22 NOTE — Consult Note (Addendum)
WOC Nurse wound consult note Reason for Consult: Consult requested for right heel wound. Wound type: Stage 3 wound to center, 1X3X.2cm surrounded by 4X4cm white macerated  Skin where previous blister which has ruptured. Pressure Ulcer POA: Yes Wound bed: inner wound bed beefy red Drainage (amount, consistency, odor) small amt yellow drainage Periwound: dried scabbed edges surrounding wound Dressing procedure/placement/frequency: Foam dressing to protect and promote healing. Float heels to reduce pressure. Please re-consult if further assistance is needed.  Thank-you,  Cammie Mcgeeawn Kashawna Manzer MSN, RN, CWOCN, HazelWCN-AP, CNS [phone]

## 2016-08-22 NOTE — Progress Notes (Deleted)
Patient not alert and oriented to sign blood transfusion consent. There is no family, HCPOA, or legal guardian. Per MD patient needs transfusion. [facility] if no one is able to sign consent, and MD deems it necessary, proceed with transfusion.Will continue to monitor patient.

## 2016-08-22 NOTE — Clinical Social Work Note (Addendum)
CSW called and [facility]ic[facility] [place]  Charlynn CourtSarah Dammon Makarewicz, CSW [phone]  11:58 am Received call back from [place]. She stated that patient does not have a POA to her knowledge and that the facility had been trying to apply for guardianship. She will speak with the administrator and find out if that was finalized or not. Patient is a long-term resident.  Charlynn CourtSarah Leontine Radman, CSW [phone]

## 2016-08-23 DIAGNOSIS — F209 Schizophrenia, unspecified: Secondary | ICD-10-CM

## 2016-08-23 DIAGNOSIS — I4581 Long QT syndrome: Secondary | ICD-10-CM

## 2016-08-23 DIAGNOSIS — E1101 Type 2 diabetes mellitus with hyperosmolarity with coma: Secondary | ICD-10-CM

## 2016-08-23 LAB — URINE CULTURE: Culture: NO GROWTH

## 2016-08-23 LAB — PROCALCITONIN: Procalcitonin: 4.13 ng/mL

## 2016-08-23 LAB — CBC
HCT: 23.4 % — ABNORMAL LOW (ref 39.0–52.0)
Hemoglobin: 7.5 g/dL — ABNORMAL LOW (ref 13.0–17.0)
MCH: 29.1 pg (ref 26.0–34.0)
MCHC: 32.1 g/dL (ref 30.0–36.0)
MCV: 90.7 fL (ref 78.0–100.0)
PLATELETS: 196 10*3/uL (ref 150–400)
RBC: 2.58 MIL/uL — AB (ref 4.22–5.81)
RDW: 14 % (ref 11.5–15.5)
WBC: 5.8 10*3/uL (ref 4.0–10.5)

## 2016-08-23 LAB — GLUCOSE, CAPILLARY
GLUCOSE-CAPILLARY: 218 mg/dL — AB (ref 65–99)
GLUCOSE-CAPILLARY: 254 mg/dL — AB (ref 65–99)
GLUCOSE-CAPILLARY: 284 mg/dL — AB (ref 65–99)

## 2016-08-23 LAB — BASIC METABOLIC PANEL
ANION GAP: 10 (ref 5–15)
BUN: 49 mg/dL — ABNORMAL HIGH (ref 6–20)
CALCIUM: 8.4 mg/dL — AB (ref 8.9–10.3)
CO2: 23 mmol/L (ref 22–32)
CREATININE: 2.53 mg/dL — AB (ref 0.61–1.24)
Chloride: 101 mmol/L (ref 101–111)
GFR, EST AFRICAN AMERICAN: 30 mL/min — AB (ref >60)
GFR, EST NON AFRICAN AMERICAN: 26 mL/min — AB (ref >60)
Glucose, Bld: 235 mg/dL — ABNORMAL HIGH (ref 65–99)
Potassium: 4.5 mmol/L (ref 3.5–5.1)
SODIUM: 134 mmol/L — AB (ref 135–145)

## 2016-08-23 LAB — TYPE AND SCREEN
ABO/RH(D): B POS
ANTIBODY SCREEN: NEGATIVE
Unit division: 0

## 2016-08-23 LAB — HEMOGLOBIN A1C
HEMOGLOBIN A1C: 8.7 % — AB (ref 4.8–5.6)
Mean Plasma Glucose: 203 mg/dL

## 2016-08-23 LAB — LEGIONELLA PNEUMOPHILA SEROGP 1 UR AG: L. pneumophila Serogp 1 Ur Ag: NEGATIVE

## 2016-08-23 NOTE — Consult Note (Signed)
[facility] Consult   Reason for Consult:  History of schizophrenia and depression  Referring Physician:  Dr. Lonny Prude Patient Identification: Clayton [name] MRN:  [id number] Principal Diagnosis: Pneumonia Diagnosis:   Patient Active Problem List   Diagnosis Date Noted  . Acute systolic congestive heart failure ([place]) [I50.21]   . HCAP (healthcare-associated pneumonia) [J18.9] 08/21/2016  . Acute renal insufficiency [N28.9]   . Dehydration [E86.0] 06/18/2016  . Nausea & vomiting [R11.2] 06/18/2016  . Schizophrenia ([place]) [F20.9] 06/18/2016  . Chronic diastolic heart failure, NYHA class 1 ([place]) [I50.32] 06/18/2016  . Acute kidney injury ([place]) [N17.9] 06/18/2016  . Essential hypertension [I10]   . AKI (acute kidney injury) ([place]) [N17.9] 06/09/2016  . Near syncope [R55] 06/09/2016  . Acute pulmonary edema ([place]) [J81.0] 01/09/2016  . Type 2 diabetes mellitus with hyperosmolar nonketotic hyperglycemia ([place]) [E11.01] 12/16/2015  . Prolonged Q-T interval on ECG [I45.81] 12/16/2015  . CKD (chronic kidney disease) [N18.9] 12/11/2015  . Diabetes mellitus with complication ([place]) [L45.6] 12/11/2015  . Homeless [Z59.0] 12/11/2015  . Anemia of chronic disease [D63.8] 12/11/2015  . Abnormal chest x-ray [R93.8] 12/06/2015  . Hyperglycemia [R73.9] 12/04/2015  . Hypertensive urgency [I16.0] 12/04/2015  . Cocaine abuse [F14.10] 10/08/2015  . Acute kidney injury superimposed on chronic kidney disease ([place]) [N17.9, N18.9] 08/17/2015  . Accelerated hypertension [I10]   . History of Polysubstance abuse [F19.10] 12/13/2011  . Hyponatremia [E87.1] 12/01/2011    Total Time spent with patient: 30 minutes  Subjective:   Clayton [name] is a 65 y.o. male patient admitted with pneumonia .  HPI:  Patient is a 65 year old male, [name], presented due to complaints of feeling more tired than usual and  a chest X ray suspicious for pneumonia . Patient  Diagnosed with Pneumonia  and with Acute on Chronic Diastolic HF .   As per chart patient has a history of Schizophrenia , and also endorses depression .  Patient does endorse feeling sad, which he attributes partly  to boredom and limited things to look forward to /enjoy at [facility]. He describes low energy level , associated with recent medical illness, but states appetite and sleep have been stable . He denies any suicidal ideations .  Patient states " I have been told I have paranoid schizophrenia " (  in the past,not recently ) but  has difficulty describing symptoms he had experienced.  He  denies history of inpatient psychiatric admissions . He does report occasional and brief visual hallucinations in the past, but cannot describe, denies history of auditory hallucinations, and states he has not experienced any hallucinations in " a long time". Of note, patient reports a history of intermittent cocaine abuse, states last used 2 months ago .  At this time he is not presenting with any symptoms of psychosis- his thought process is linear , well organized, he is denying hallucinations and does not appear internally preoccupied, no delusions are expressed .   EKG- QTc 499- (  prior EKGJuly/17 QTc 512)      Past Psychiatric History:  As above, patient reports history of paranoid schizophrenia diagnosis, but does not endorse any clear history of psychotic decompensations or any history of psychiatric admissions . Denies any history of suicide attempts or of violent behaviors .Of note, patient does not endorse any clear history of mania, hypomania .  As per chart PTA medication list, he was on  Depakote 250 mgrs TID, and on Haldol 1 mgr BID  Risk  to Self: Is patient at risk for suicide?: No Risk to Others:   Prior Inpatient Therapy:   Prior Outpatient Therapy:    Past Medical History:  Past Medical History:  Diagnosis Date  . Anemia   . CHF (congestive heart failure) ([place])   . CKD (chronic kidney disease), stage III    . Depression   . DVT (deep venous thrombosis) ([facility])    LLE  . GERD (gastroesophageal reflux disease)   . HTN (hypertension)   . Medical non-compliance   . Migraine    "a few times/month" (12/06/2015)  . Pneumonia 12/06/2015  . Polysubstance abuse    cocaine  . Schizophrenia ([place])   . Type II diabetes mellitus ([place])     Past Surgical History:  Procedure Laterality Date  . NO PAST SURGERIES     Family History:  Family History  Problem Relation Age of Onset  . Diabetes Mellitus II Mother   . Diabetes Mellitus II Father   . Stroke Maternal Grandfather    Family Psychiatric  History: denies history of family mental illness  Social History: single, has an adult daughter from whom he is estranged, [place] resident [facility] ) - states he has been there for 2 years, states " it is OK" . History  Alcohol Use  . Yes     History  Drug Use  . Types: "Crack" cocaine, Marijuana    Comment: 12/06/2015 "last used crack 3-4 months ago"    Social History   Social History  . Marital status: Single    Spouse name: N/A  . Number of children: N/A  . Years of education: N/A   Social History Main Topics  . Smoking status: Current Every Day Smoker    Packs/day: 0.25    Years: 48.00    Types: Cigarettes  . Smokeless tobacco: Never Used  . Alcohol use Yes  . Drug use:     Types: "Crack" cocaine, Marijuana     Comment: 12/06/2015 "last used crack 3-4 months ago"  . Sexual activity: Not Asked   Other Topics Concern  . None   Social History Narrative   Lives at [facility], shelter.    Additional Social History:    Allergies:   Allergies  Allergen Reactions  . Latex Rash    Labs:  Results for orders placed or performed during the hospital encounter of 08/21/16 (from the past 48 hour(s))  Procalcitonin - Baseline     Status: None   Collection Time: 08/21/16  7:21 PM  Result Value Ref Range   Procalcitonin 3.07 ng/mL    Comment:        Interpretation: PCT  > 2 ng/mL: Systemic infection (sepsis) is likely, unless other causes are known. (NOTE)         ICU PCT Algorithm               Non ICU PCT Algorithm    ----------------------------     ------------------------------         PCT < 0.25 ng/mL                 PCT < 0.1 ng/mL     Stopping of antibiotics            Stopping of antibiotics       strongly encouraged.               strongly encouraged.    ----------------------------     ------------------------------  PCT level decrease by               PCT < 0.25 ng/mL       >= 80% from peak PCT       OR PCT 0.25 - 0.5 ng/mL          Stopping of antibiotics                                             encouraged.     Stopping of antibiotics           encouraged.    ----------------------------     ------------------------------       PCT level decrease by              PCT >= 0.25 ng/mL       < 80% from peak PCT        AND PCT >= 0.5 ng/mL            Continuing antibiotics                                               encouraged.       Continuing antibiotics            encouraged.    ----------------------------     ------------------------------     PCT level increase compared          PCT > 0.5 ng/mL         with peak PCT AND          PCT >= 0.5 ng/mL             Escalation of antibiotics                                          strongly encouraged.      Escalation of antibiotics        strongly encouraged.   Type and screen [place]     Status: None   Collection Time: 08/21/16  7:30 PM  Result Value Ref Range   ABO/RH(D) B POS    Antibody Screen NEG    Sample Expiration 08/24/2016    Unit Number [id number]    Blood Component Type RED CELLS,LR    Unit division 00    Status of Unit ISSUED,FINAL    Transfusion Status OK TO TRANSFUSE    Crossmatch Result Compatible   Glucose, capillary     Status: Abnormal   Collection Time: 08/21/16  9:55 PM  Result Value Ref Range   Glucose-Capillary 367 (H) 65 -  99 mg/dL   Comment 1 Notify RN   Glucose, capillary     Status: Abnormal   Collection Time: 08/22/16 12:45 AM  Result Value Ref Range   Glucose-Capillary 331 (H) 65 - 99 mg/dL  Glucose, capillary     Status: Abnormal   Collection Time: 08/22/16  4:23 AM  Result Value Ref Range   Glucose-Capillary 294 (H) 65 - 99 mg/dL  Urine culture     Status: None   Collection Time: 08/22/16  5:17 AM  Result Value Ref Range  Specimen Description URINE, RANDOM    Special Requests NONE    Culture NO GROWTH    Report Status 08/23/2016 FINAL   Strep pneumoniae urinary antigen     Status: None   Collection Time: 08/22/16  5:17 AM  Result Value Ref Range   Strep Pneumo Urinary Antigen NEGATIVE NEGATIVE    Comment:        Infection due to S. pneumoniae cannot be absolutely ruled out since the antigen present may be below the detection limit of the test.   Comprehensive metabolic panel     Status: Abnormal   Collection Time: 08/22/16  7:28 AM  Result Value Ref Range   Sodium 137 135 - 145 mmol/L   Potassium 4.3 3.5 - 5.1 mmol/L   Chloride 105 101 - 111 mmol/L   CO2 25 22 - 32 mmol/L   Glucose, Bld 263 (H) 65 - 99 mg/dL   BUN 47 (H) 6 - 20 mg/dL   Creatinine, Ser 2.44 (H) 0.61 - 1.24 mg/dL   Calcium 8.5 (L) 8.9 - 10.3 mg/dL   Total Protein 5.4 (L) 6.5 - 8.1 g/dL   Albumin 2.3 (L) 3.5 - 5.0 g/dL   AST 14 (L) 15 - 41 U/L   ALT 16 (L) 17 - 63 U/L   Alkaline Phosphatase 61 38 - 126 U/L   Total Bilirubin 0.5 0.3 - 1.2 mg/dL   GFR calc non Af Amer 27 (L) >60 mL/min   GFR calc Af Amer 31 (L) >60 mL/min    Comment: (NOTE) The eGFR has been calculated using the CKD EPI equation. This calculation has not been validated in all clinical situations. eGFR's persistently <60 mL/min signify possible Chronic Kidney Disease.    Anion gap 7 5 - 15  CBC     Status: Abnormal   Collection Time: 08/22/16  7:28 AM  Result Value Ref Range   WBC 6.0 4.0 - 10.5 K/uL   RBC 2.28 (L) 4.22 - 5.81 MIL/uL    Hemoglobin 6.7 (LL) 13.0 - 17.0 g/dL    Comment: REPEATED TO VERIFY CRITICAL RESULT CALLED TO, READ BACK BY AND VERIFIED WITH: Dalene Seltzer JESSICA RN 0815 08/22/2016 BY MACEDA, J    HCT 21.0 (L) 39.0 - 52.0 %   MCV 92.1 78.0 - 100.0 fL   MCH 29.4 26.0 - 34.0 pg   MCHC 31.9 30.0 - 36.0 g/dL   RDW 14.2 11.5 - 15.5 %   Platelets 199 150 - 400 K/uL    Comment: REPEATED TO VERIFY  Hemoglobin A1c     Status: Abnormal   Collection Time: 08/22/16  7:30 AM  Result Value Ref Range   Hgb A1c MFr Bld 8.7 (H) 4.8 - 5.6 %    Comment: (NOTE)         Pre-diabetes: 5.7 - 6.4         Diabetes: >6.4         Glycemic control for adults with diabetes: <7.0    Mean Plasma Glucose 203 mg/dL    Comment: (NOTE) Performed At: [facility] [place], [place] [id number] Lindon Romp MD XL:[id number]   MRSA PCR Screening     Status: None   Collection Time: 08/22/16  7:53 AM  Result Value Ref Range   MRSA by PCR NEGATIVE NEGATIVE    Comment:        The GeneXpert MRSA Assay (FDA approved for NASAL specimens only), is one component of a comprehensive MRSA colonization surveillance program.  It is not intended to diagnose MRSA infection nor to guide or monitor treatment for MRSA infections.   Glucose, capillary     Status: Abnormal   Collection Time: 08/22/16  7:54 AM  Result Value Ref Range   Glucose-Capillary 238 (H) 65 - 99 mg/dL  Hemoglobin and hematocrit, blood     Status: Abnormal   Collection Time: 08/22/16  8:46 AM  Result Value Ref Range   Hemoglobin 6.8 (LL) 13.0 - 17.0 g/dL    Comment: REPEATED TO VERIFY CRITICAL VALUE NOTED.  VALUE IS CONSISTENT WITH PREVIOUSLY REPORTED AND CALLED VALUE.    HCT 21.3 (L) 39.0 - 52.0 %  Prepare RBC     Status: None   Collection Time: 08/22/16 11:37 AM  Result Value Ref Range   Order Confirmation ORDER PROCESSED BY BLOOD BANK   Glucose, capillary     Status: Abnormal   Collection Time: 08/22/16 11:40 AM  Result Value Ref Range    Glucose-Capillary 216 (H) 65 - 99 mg/dL  Glucose, capillary     Status: Abnormal   Collection Time: 08/22/16  5:08 PM  Result Value Ref Range   Glucose-Capillary 288 (H) 65 - 99 mg/dL   Comment 1 Notify RN   Glucose, capillary     Status: Abnormal   Collection Time: 08/22/16 10:00 PM  Result Value Ref Range   Glucose-Capillary 343 (H) 65 - 99 mg/dL   Comment 1 Notify RN    Comment 2 Document in Chart   Basic metabolic panel     Status: Abnormal   Collection Time: 08/23/16  5:05 AM  Result Value Ref Range   Sodium 134 (L) 135 - 145 mmol/L   Potassium 4.5 3.5 - 5.1 mmol/L   Chloride 101 101 - 111 mmol/L   CO2 23 22 - 32 mmol/L   Glucose, Bld 235 (H) 65 - 99 mg/dL   BUN 49 (H) 6 - 20 mg/dL   Creatinine, Ser 2.53 (H) 0.61 - 1.24 mg/dL   Calcium 8.4 (L) 8.9 - 10.3 mg/dL   GFR calc non Af Amer 26 (L) >60 mL/min   GFR calc Af Amer 30 (L) >60 mL/min    Comment: (NOTE) The eGFR has been calculated using the CKD EPI equation. This calculation has not been validated in all clinical situations. eGFR's persistently <60 mL/min signify possible Chronic Kidney Disease.    Anion gap 10 5 - 15  Procalcitonin     Status: None   Collection Time: 08/23/16  5:05 AM  Result Value Ref Range   Procalcitonin 4.13 ng/mL    Comment:        Interpretation: PCT > 2 ng/mL: Systemic infection (sepsis) is likely, unless other causes are known. (NOTE)         ICU PCT Algorithm               Non ICU PCT Algorithm    ----------------------------     ------------------------------         PCT < 0.25 ng/mL                 PCT < 0.1 ng/mL     Stopping of antibiotics            Stopping of antibiotics       strongly encouraged.               strongly encouraged.    ----------------------------     ------------------------------       PCT level  decrease by               PCT < 0.25 ng/mL       >= 80% from peak PCT       OR PCT 0.25 - 0.5 ng/mL          Stopping of antibiotics                                              encouraged.     Stopping of antibiotics           encouraged.    ----------------------------     ------------------------------       PCT level decrease by              PCT >= 0.25 ng/mL       < 80% from peak PCT        AND PCT >= 0.5 ng/mL            Continuing antibiotics                                               encouraged.       Continuing antibiotics            encouraged.    ----------------------------     ------------------------------     PCT level increase compared          PCT > 0.5 ng/mL         with peak PCT AND          PCT >= 0.5 ng/mL             Escalation of antibiotics                                          strongly encouraged.      Escalation of antibiotics        strongly encouraged.   CBC     Status: Abnormal   Collection Time: 08/23/16  5:05 AM  Result Value Ref Range   WBC 5.8 4.0 - 10.5 K/uL   RBC 2.58 (L) 4.22 - 5.81 MIL/uL   Hemoglobin 7.5 (L) 13.0 - 17.0 g/dL   HCT 23.4 (L) 39.0 - 52.0 %   MCV 90.7 78.0 - 100.0 fL   MCH 29.1 26.0 - 34.0 pg   MCHC 32.1 30.0 - 36.0 g/dL   RDW 14.0 11.5 - 15.5 %   Platelets 196 150 - 400 K/uL  Glucose, capillary     Status: Abnormal   Collection Time: 08/23/16  6:59 AM  Result Value Ref Range   Glucose-Capillary 218 (H) 65 - 99 mg/dL  Glucose, capillary     Status: Abnormal   Collection Time: 08/23/16 11:38 AM  Result Value Ref Range   Glucose-Capillary 254 (H) 65 - 99 mg/dL   Comment 1 Notify RN   Glucose, capillary     Status: Abnormal   Collection Time: 08/23/16  3:48 PM  Result Value Ref Range   Glucose-Capillary 284 (H) 65 - 99 mg/dL   Comment 1 Notify RN    Comment 2 Document in Chart  Current Facility-Administered Medications  Medication Dose Route Frequency Provider Last Rate Last Dose  . ceFEPIme (MAXIPIME) 2 g in dextrose 5 % 50 mL IVPB  2 g Intravenous Q24H Rebecka Apley, RPH   2 g at 08/22/16 2157  . dextrose 50 % solution 50 mL  1 ampule Intravenous PRN Waldemar Dickens,  MD      . diltiazem [facility] CD) 24 hr capsule 240 mg  240 mg Oral Daily Rondel Jumbo, PA-C   240 mg at 08/23/16 1103  . divalproex (DEPAKOTE SPRINKLE) capsule 250 mg  250 mg Oral TID Rondel Jumbo, PA-C   250 mg at 08/23/16 1104  . famotidine (PEPCID) tablet 10 mg  10 mg Oral Daily Rondel Jumbo, PA-C   10 mg at 08/23/16 1103  . ferrous sulfate tablet 325 mg  325 mg Oral Q breakfast Rondel Jumbo, PA-C   325 mg at 08/23/16 1105  . furosemide (LASIX) injection 40 mg  40 mg Intravenous BID Waldemar Dickens, MD   40 mg at 08/23/16 1105  . guaiFENesin (MUCINEX) 12 hr tablet 600 mg  600 mg Oral BID PRN Rondel Jumbo, PA-C      . haloperidol (HALDOL) tablet 1 mg  1 mg Oral BID Rondel Jumbo, PA-C   1 mg at 08/23/16 1223  . heparin injection 5,000 Units  5,000 Units Subcutaneous Q8H Rondel Jumbo, PA-C   5,000 Units at 08/23/16 2440  . hydrALAZINE (APRESOLINE) tablet 50 mg  50 mg Oral TID Rondel Jumbo, PA-C   50 mg at 08/23/16 1103  . insulin aspart (novoLOG) injection 0-5 Units  0-5 Units Subcutaneous QHS Jani Gravel, MD   4 Units at 08/22/16 2231  . insulin aspart (novoLOG) injection 0-9 Units  0-9 Units Subcutaneous TID WC Jani Gravel, MD   5 Units at 08/23/16 1224  . ipratropium-albuterol (DUONEB) 0.5-2.5 (3) MG/3ML nebulizer solution 3 mL  3 mL Nebulization Q4H PRN Rondel Jumbo, PA-C      . isosorbide mononitrate (IMDUR) 24 hr tablet 30 mg  30 mg Oral Daily Rondel Jumbo, PA-C   30 mg at 08/23/16 1103  . LORazepam (ATIVAN) tablet 0.5 mg  0.5 mg Oral BID PRN Rondel Jumbo, PA-C   0.5 mg at 08/22/16 1707  . pantoprazole (PROTONIX) EC tablet 40 mg  40 mg Oral Daily Rondel Jumbo, PA-C   40 mg at 08/23/16 1103  . vancomycin (VANCOCIN) 1,250 mg in sodium chloride 0.9 % 250 mL IVPB  1,250 mg Intravenous Q24H Rebecka Apley, RPH   1,250 mg at 08/23/16 1027    Musculoskeletal: Strength & Muscle Tone: within normal limits Gait & Station: normal- gait not examined  Patient leans:  N/A  Psychiatric Specialty Exam: Physical Exam  ROS states he has mild headache, denies chest pain at this time , describes dyspnea on exertion, denies fever or chills   Blood pressure (!) 145/76, pulse 90, temperature 98.2 F (36.8 C), temperature source Oral, resp. rate 16, height '6\' 2"'  (1.88 m), weight 211 lb (95.7 kg), SpO2 92 %.Body mass index is 27.09 kg/m.  General Appearance: Fairly Groomed- in hospital garb   Eye Contact:  Fair  Speech:  Normal Rate  Volume:  Decreased, somewhat monotone   Mood:  reports mild depression, but states he is feeling better as his medical condition improves .  Affect:  blunted   Thought Process:  Linear- no disorganized or tangential thought process noted  Orientation:  Other:  oriented to [facility], [place], September, 2017, could not identify day of week or date   Thought Content:  denies hallucinations, does not appear internally preoccupied, no delusions expressed   Suicidal Thoughts:  No denies any suicidal ideations, denies any self injurious ideations, denies any violent or homicidal ideations  Homicidal Thoughts:  No  Memory:  recent and remote fair   Judgement:  Fair  Insight:  Fair  Psychomotor Activity:  Decreased- no agitation or restlessness  Concentration:  Concentration: Good and Attention Span: Good  Recall:  Good  Fund of Knowledge:  Good  Language:  Good  Akathisia:  Negative  Handed:  Right  AIMS (if indicated):     Assets:  Desire for Improvement Resilience  ADL's:   Fair   Cognition:  Impaired,  Mild- fully alert , attentive, oriented, except partially to time   Sleep:      Assessment - patient and chart report history of Schizophrenia, At this time patient is not presenting with any symptoms of psychosis and does not present guarded or paranoid . He does report mild depression, which he attributes to medical illness, boredom, but states he is feeling better as he improves clinically . He is not endorsing any history  of mania or bipolar spectrum disorder at this time .  He denies any suicidal ideations. He denies any side effects on medications ( low dose Haldol and Depakote) .       Treatment Plan Summary: see below   Disposition: No evidence of imminent risk to self or others at present.   Patient does not meet criteria for psychiatric inpatient admission. -would continue low dose Haldol ( 1 mgr BID)  , based on history of mental illness and good tolerance.  ( Of note, however,  haloperidol should be used with caution as could contribute to increased QTc-  continue monitoring EKG and if QTc increases would consider D/C ing  haloperidol .)   - would continue Depakote at this time , currently at 750 mgrs total daily dose .  (although he does not endorse any clear history of bipolar disorder or schizoaffective disorder , he does report that he has been psychiatrically stable on the medications he has been taking , and is tolerating well without  side effects. )  - would order valproic acid serum level to determine if dose needs to be adjusted.  - consider Zoloft trial for depression - would start at low dose initially ( 25 mgrs QDAY )   - patient will benefit from outpatient psychiatric follow up after discharge in order to continue to monitor and to  reassess psychiatric  medications as needed - please include this recommendation in disposition instructions .    Neita Garnet, MD 08/23/2016 4:39 PM

## 2016-08-23 NOTE — Progress Notes (Signed)
OT Cancellation Note  Patient Details Name: Clayton [name] L Lesko MRN: [id number] DOB: [birth year]   Cancelled Treatment:    Reason Eval/Treat Not Completed: Other (comment) -- [facility]. Will defer all OT needs to SNF. Thank you.  Laron Angelini A 08/23/2016, 8:38 AM

## 2016-08-23 NOTE — Progress Notes (Signed)
PROGRESS NOTE    Clayton [name]  WUJ:[id number] DOB: Jan 08, 1951 DOA: 08/21/2016 PCP: Georgann HousekeeperHUSAIN,KARRAR, MD   Brief Narrative: Clayton [name] is a 65 y.o. with a history of hypertension, hyperlipidemia, CKD, combined systolic and diastolic heart failure, diabetes mellitus type 2, depression, schizophrenia, GERD, recurrent pneumonia. He presented to the emergency department with dyspnea and was found to have a right-sided pneumonia.   Assessment & Plan:   Active Problems:   Acute kidney injury superimposed on chronic kidney disease (HCC)   Abnormal chest x-ray   CKD (chronic kidney disease)   Diabetes mellitus with complication (HCC)   Anemia of chronic disease   Type 2 diabetes mellitus with hyperosmolar nonketotic hyperglycemia (HCC)   Prolonged Q-T interval on ECG   Schizophrenia (HCC)   Chronic diastolic heart failure, NYHA class 1 (HCC)   Essential hypertension   HCAP (healthcare-associated pneumonia)   Acute systolic congestive heart failure (HCC)   Right upper and basilar healthcare associated pneumonia Patient is stable. Afebrile. Urine Strep is negative. Blood cultures pending. Procalcitonin trended up. Patient improving. Blood culture no growth x1 day. Respiratory virus panel negative. -Continue vancomycin IV and cefepime IV. Anticipate transition to oral tomorrow. -Follow-up Legionella -Follow-up blood cultures  Acute on chronic diastolic heart failure Electrocardiogram significant for EF of 55-60% with diastolic dysfunction. Weight up 15 lbs. Clinically, he does not look fluid up. On room air. No distress. Blood pressure is high, however. -Daily weights -Strict I/O -Continue Lasix 40 mg twice a day by mouth. May need to switch to IV therapy if worsens  Hypertension Elevated -Continue diltiazem 240 mg daily -Continue Imdur 30 mg daily  Acute on chronic kidney disease, stage III Possibly secondary to acute heart failure exacerbation.  -Repeat BMP in the  morning  Anemia Baseline appears to be 8 and 9. He was 7.8 on admission and has dropped down to 6.7 and is stable at 6.8. With his heart failure, would like to be about. S/p 1u PRBC. Hemoglobin up to 7.5 -repeat CBC in the morning  Type 2 diabetes A1C 8.7 (improved) -Continue sliding scale insulin   GERD -Continue PPI  Schizophrenia -Continue Depakote -Psychiatry consult  Deconditioning Malnutrition SNF. -follow-up nutrition  DVT prophylaxis: Heparin subq Code Status: Full code Family Communication: None at bedside Disposition Plan: Plans for discharge to SNF when medically stable in 1-2 days   Consultants:   Physical therapy   Occupational therapy  Nutrition  Procedures:  None   Antimicrobials:  Vancomycin (9/14>>  Cefepime (9/14>>   Subjective: Patient was agitated today and did not want me to touch him. He reports no issues overnight. He started to get annoyed with repeated questioning.   Objective: Vitals:   08/22/16 2156 08/23/16 0441 08/23/16 0737 08/23/16 1131  BP: (!) 146/72 (!) 152/73 (!) 149/74 (!) 145/76  Pulse: 87 90 95 90  Resp: 18 18  16   Temp: 97.7 F (36.5 C) 98.5 F (36.9 C) 99.1 F (37.3 C) 98.2 F (36.8 C)  TempSrc: Oral Oral Oral Oral  SpO2: 93% 95% 93% 92%  Weight:  95.7 kg (211 lb)    Height:        Intake/Output Summary (Last 24 hours) at 08/23/16 1549 Last data filed at 08/23/16 1300  Gross per 24 hour  Intake             1175 ml  Output             1850 ml  Net             -  675 ml   Filed Weights   08/21/16 0915 08/22/16 0408 08/23/16 0441  Weight: 86.2 kg (190 lb) 88.3 kg (194 lb 11.2 oz) 95.7 kg (211 lb)    Examination:  General exam: Appears calm and comfortable Respiratory system: Clear to auscultation. Respiratory effort normal. Cardiovascular system: S1 & S2 heard, RRR. No murmurs, rubs, gallops or clicks. Gastrointestinal system: Abdomen is nondistended, soft and nontender. Normal bowel sounds  heard. Central nervous system: Alert and oriented. No focal neurological deficits. Extremities: No edema. No calf tenderness Skin: No cyanosis. No rashes Psychiatry: Judgement and insight appear poor. Mood & affect flat. Patient seemed apprehensive    Data Reviewed: I have personally reviewed following labs and imaging studies  CBC:  Recent Labs Lab 08/21/16 1132 08/22/16 0728 08/22/16 0846 08/23/16 0505  WBC 9.3 6.0  --  5.8  HGB 7.8* 6.7* 6.8* 7.5*  HCT 23.9* 21.0* 21.3* 23.4*  MCV 92.3 92.1  --  90.7  PLT 210 199  --  196   Basic Metabolic Panel:  Recent Labs Lab 08/21/16 1132 08/22/16 0728 08/23/16 0505  NA 138 137 134*  K 4.6 4.3 4.5  CL 107 105 101  CO2 24 25 23   GLUCOSE 61* 263* 235*  BUN 46* 47* 49*  CREATININE 2.35* 2.44* 2.53*  CALCIUM 9.0 8.5* 8.4*   GFR: Estimated Creatinine Clearance: 35.2 mL/min (by C-G formula based on SCr of 2.53 mg/dL (H)). Liver Function Tests:  Recent Labs Lab 08/22/16 0728  AST 14*  ALT 16*  ALKPHOS 61  BILITOT 0.5  PROT 5.4*  ALBUMIN 2.3*   No results for input(s): LIPASE, AMYLASE in the last 168 hours. No results for input(s): AMMONIA in the last 168 hours. Coagulation Profile: No results for input(s): INR, PROTIME in the last 168 hours. Cardiac Enzymes: No results for input(s): CKTOTAL, CKMB, CKMBINDEX, TROPONINI in the last 168 hours. BNP (last 3 results) No results for input(s): PROBNP in the last 8760 hours. HbA1C:  Recent Labs  08/22/16 0730  HGBA1C 8.7*   CBG:  Recent Labs Lab 08/22/16 1140 08/22/16 1708 08/22/16 2200 08/23/16 0659 08/23/16 1138  GLUCAP 216* 288* 343* 218* 254*   Lipid Profile: No results for input(s): CHOL, HDL, LDLCALC, TRIG, CHOLHDL, LDLDIRECT in the last 72 hours. Thyroid Function Tests: No results for input(s): TSH, T4TOTAL, FREET4, T3FREE, THYROIDAB in the last 72 hours. Anemia Panel: No results for input(s): VITAMINB12, FOLATE, FERRITIN, TIBC, IRON, RETICCTPCT in  the last 72 hours. Sepsis Labs:  Recent Labs Lab 08/21/16 1921 08/23/16 0505  PROCALCITON 3.07 4.13    Recent Results (from the past 240 hour(s))  Blood culture (routine x 2)     Status: None (Preliminary result)   Collection Time: 08/21/16 11:20 AM  Result Value Ref Range Status   Specimen Description BLOOD LEFT HAND  Final   Special Requests BOTTLES DRAWN AEROBIC AND ANAEROBIC 5CC  Final   Culture NO GROWTH 1 DAY  Final   Report Status PENDING  Incomplete  Blood culture (routine x 2)     Status: None (Preliminary result)   Collection Time: 08/21/16 11:35 AM  Result Value Ref Range Status   Specimen Description BLOOD RIGHT HAND  Final   Special Requests BOTTLES DRAWN AEROBIC AND ANAEROBIC 5CC  Final   Culture NO GROWTH 1 DAY  Final   Report Status PENDING  Incomplete  Respiratory Panel by PCR     Status: None   Collection Time: 08/21/16  2:17 PM  Result Value  Ref Range Status   Adenovirus NOT DETECTED NOT DETECTED Final   Coronavirus 229E NOT DETECTED NOT DETECTED Final   Coronavirus HKU1 NOT DETECTED NOT DETECTED Final   Coronavirus NL63 NOT DETECTED NOT DETECTED Final   Coronavirus OC43 NOT DETECTED NOT DETECTED Final   Metapneumovirus NOT DETECTED NOT DETECTED Final   Rhinovirus / Enterovirus NOT DETECTED NOT DETECTED Final   Influenza A NOT DETECTED NOT DETECTED Final   Influenza B NOT DETECTED NOT DETECTED Final   Parainfluenza Virus 1 NOT DETECTED NOT DETECTED Final   Parainfluenza Virus 2 NOT DETECTED NOT DETECTED Final   Parainfluenza Virus 3 NOT DETECTED NOT DETECTED Final   Parainfluenza Virus 4 NOT DETECTED NOT DETECTED Final   Respiratory Syncytial Virus NOT DETECTED NOT DETECTED Final   Bordetella pertussis NOT DETECTED NOT DETECTED Final   Chlamydophila pneumoniae NOT DETECTED NOT DETECTED Final   Mycoplasma pneumoniae NOT DETECTED NOT DETECTED Final  Urine culture     Status: None   Collection Time: 08/22/16  5:17 AM  Result Value Ref Range Status    Specimen Description URINE, RANDOM  Final   Special Requests NONE  Final   Culture NO GROWTH  Final   Report Status 08/23/2016 FINAL  Final  MRSA PCR Screening     Status: None   Collection Time: 08/22/16  7:53 AM  Result Value Ref Range Status   MRSA by PCR NEGATIVE NEGATIVE Final    Comment:        The GeneXpert MRSA Assay (FDA approved for NASAL specimens only), is one component of a comprehensive MRSA colonization surveillance program. It is not intended to diagnose MRSA infection nor to guide or monitor treatment for MRSA infections.          Radiology Studies: Dg Chest 2 View  Result Date: 08/22/2016 CLINICAL DATA:  Shortness of breath cough, healthcare associated pneumonia. Acute on chronic renal insufficiency, diabetes, acute and. Chronic CHF EXAM: CHEST  2 VIEW COMPARISON:  PA and lateral chest x-ray of August 21, 2016 FINDINGS: The lungs are borderline hypoinflated. Alveolar opacity persists in the right upper and lower lobe. Interstitial prominence persists elsewhere in the right lung and throughout the left lung. There is no significant pleural effusion. The cardiac silhouette is enlarged. The pulmonary vascularity is mildly prominent. IMPRESSION: Bilateral pneumonia most conspicuous on the right, not greatly changed since yesterday's study. Stable cardiomegaly with mild central pulmonary vascular congestion. Electronically Signed   By: David  [place] M.D.   On: 08/22/2016 07:43        Scheduled Meds: . ceFEPime (MAXIPIME) IV  2 g Intravenous Q24H  . diltiazem  240 mg Oral Daily  . divalproex  250 mg Oral TID  . famotidine  10 mg Oral Daily  . ferrous sulfate  325 mg Oral Q breakfast  . furosemide  40 mg Intravenous BID  . haloperidol  1 mg Oral BID  . heparin  5,000 Units Subcutaneous Q8H  . hydrALAZINE  50 mg Oral TID  . insulin aspart  0-5 Units Subcutaneous QHS  . insulin aspart  0-9 Units Subcutaneous TID WC  . isosorbide mononitrate  30 mg Oral Daily   . pantoprazole  40 mg Oral Daily  . vancomycin  1,250 mg Intravenous Q24H   Continuous Infusions:    LOS: 2 days     Jacquelin Hawking [facility] 08/23/2016, 3:49 PM Pager: [phone]  If 7PM-7AM, please contact night-coverage [email or web address] Password [facility] 08/23/2016, 3:49 PM

## 2016-08-23 NOTE — Progress Notes (Signed)
Inpatient Diabetes Program Recommendations  AACE/ADA: New Consensus Statement on Inpatient Glycemic Control (2015)  Target Ranges:  Prepandial:   less than 140 mg/dL      Peak postprandial:   less than 180 mg/dL (1-2 hours)      Critically ill patients:  140 - 180 mg/dL   Lab Results  Component Value Date   GLUCAP 218 (H) 08/23/2016   HGBA1C 8.7 (H) 08/22/2016   Review of Glycemic Control  Diabetes history: DM 2 Outpatient Diabetes medications: Levemir 25 Daily, Humalog 2-10 units TID Current orders for Inpatient glycemic control: Novolog Sensitive + HS scale  Inpatient Diabetes Program Recommendations:   Glucose trends in the 200-300 range. Patient takes basal insulin at home. Please consider ordering a portion of patient's home dose of basal insulin, Levemir 15 units Daily.  Thanks,  Christena DeemShannon Esley Brooking RN, MSN, [facility] Inpatient Diabetes Coordinator Team Pager [phone] (8a-5p)

## 2016-08-23 NOTE — Clinical Social Work Placement (Signed)
   CLINICAL SOCIAL WORK PLACEMENT  NOTE  Date:  08/23/2016  Patient Details  Name: Clayton [name] L Amacher MRN: [id number] Date of Birth: [birth year]  Clinical Social Work is seeking post-discharge placement for this patient at the Skilled  Nursing Facility level of care (*CSW will initial, date and re-position this form in  chart as items are completed):  Yes   Patient/family provided with [facility] Clinical Social Work Department's list of facilities offering this level of care within the geographic area requested by the patient (or if unable, by the patient's family).  Yes   Patient/family informed of their freedom to choose among providers that offer the needed level of care, that participate in Medicare, Medicaid or managed care program needed by the patient, have an available bed and are willing to accept the patient.  Yes   Patient/family informed of [facility]'s ownership interest in [facility] and [facility], as well as of the fact that they are under no obligation to receive care at these facilities.  PASRR submitted to EDS on 08/22/16     PASRR number received on       Existing PASRR number confirmed on 08/22/16     FL2 transmitted to all facilities in geographic area requested by pt/family on 08/22/16     FL2 transmitted to all facilities within larger geographic area on       Patient informed that his/her managed care company has contracts with or will negotiate with certain facilities, including the following:            Patient/family informed of bed offers received.  [facility]     Physician recommends and patient chooses bed at      [facility]  .  Patient to be transferred to facility by       Patient family notified on   of transfer.  Name of family member notified:        PHYSICIAN       Additional Comment:    _______________________________________________ Margarito LinerSarah C Oseas Detty,  LCSW 08/23/2016, 4:06 PM

## 2016-08-24 DIAGNOSIS — L8990 Pressure ulcer of unspecified site, unspecified stage: Secondary | ICD-10-CM | POA: Insufficient documentation

## 2016-08-24 DIAGNOSIS — N183 Chronic kidney disease, stage 3 (moderate): Secondary | ICD-10-CM

## 2016-08-24 LAB — BASIC METABOLIC PANEL
Anion gap: 7 (ref 5–15)
BUN: 47 mg/dL — ABNORMAL HIGH (ref 6–20)
CALCIUM: 8.2 mg/dL — AB (ref 8.9–10.3)
CO2: 25 mmol/L (ref 22–32)
CREATININE: 2.7 mg/dL — AB (ref 0.61–1.24)
Chloride: 101 mmol/L (ref 101–111)
GFR calc non Af Amer: 24 mL/min — ABNORMAL LOW (ref >60)
GFR, EST AFRICAN AMERICAN: 27 mL/min — AB (ref >60)
GLUCOSE: 239 mg/dL — AB (ref 65–99)
Potassium: 4.5 mmol/L (ref 3.5–5.1)
SODIUM: 133 mmol/L — AB (ref 135–145)

## 2016-08-24 LAB — GLUCOSE, CAPILLARY
GLUCOSE-CAPILLARY: 207 mg/dL — AB (ref 65–99)
Glucose-Capillary: 307 mg/dL — ABNORMAL HIGH (ref 65–99)

## 2016-08-24 MED ORDER — LORAZEPAM 0.5 MG PO TABS: 0.5000 mg | ORAL_TABLET | Freq: Two times a day (BID) | ORAL | 0 refills | Status: AC | PRN

## 2016-08-24 MED ORDER — DIVALPROEX SODIUM 125 MG PO CSDR: 500.0000 mg | DELAYED_RELEASE_CAPSULE | Freq: Three times a day (TID) | ORAL | Status: AC

## 2016-08-24 MED ORDER — AMOXICILLIN-POT CLAVULANATE 875-125 MG PO TABS: 1.0000 | ORAL_TABLET | Freq: Two times a day (BID) | ORAL | 0 refills | Status: AC

## 2016-08-24 MED ORDER — HALOPERIDOL 1 MG PO TABS: 1.0000 mg | ORAL_TABLET | Freq: Two times a day (BID) | ORAL | 0 refills | Status: AC

## 2016-08-24 MED ORDER — SERTRALINE HCL 25 MG PO TABS: 25.0000 mg | ORAL_TABLET | Freq: Every day | ORAL | 0 refills | Status: AC

## 2016-08-24 NOTE — Clinical Social Work Note (Signed)
Per MD patient ready to [facility]. RN, patient/family Clayton [name](Ben Brown per CSW handoff), and facility notified of patient's DC. RN given number for report. DC packet on patient's chart. Ambulance transport requested for patient. CSW signing off at this time.   Roddie McBryant Aaronmichael Brumbaugh MSW, [place]CS[place], [place], [phone]

## 2016-08-24 NOTE — Progress Notes (Signed)
Report called to [facility] nurse. Patient is discharged via Ambulance. Patient is stable IV Dc'd.  Petra Kubarica Madex Seals Rn 08/24/2016 4:18 PM

## 2016-08-24 NOTE — Discharge Summary (Signed)
Physician Discharge Summary  [name]:[id number] DOB: [birth year] DOA: 08/21/2016  PCP: Georgann Housekeeper, MD  Admit date: 08/21/2016 Discharge date: 08/24/2016  Time spent: 32 minutes  Recommendations for Outpatient Follow-up:  1. Patient to be discharged to [facility] 2. Follow blood cultures x 2 as outpatient 3. Check BMP in one week to assess renal function 4. Check CBC in one week 5. Started on Augmentin 875/125 1 tab po BID x 7 more days 6. Depakote changed to 500 mg po TID 7. Follow up outpatient psychiatric services 8. Start taking Zoloft 25 mg po daily 9. Check EKG in one week for QTc prolongation   Discharge Diagnoses:  Active Problems:   Acute kidney injury superimposed on chronic kidney disease (HCC)   CKD (chronic kidney disease)   Diabetes mellitus with complication (HCC)   Anemia of chronic disease   Type 2 diabetes mellitus with hyperosmolar nonketotic hyperglycemia (HCC)   Prolonged Q-T interval on ECG   Schizophrenia (HCC)   Chronic diastolic heart failure, NYHA class 1 (HCC)   Essential hypertension   HCAP (healthcare-associated pneumonia)    Discharge Condition: Stable  Diet recommendation: Low salt diet  Filed Weights   08/22/16 0408 08/23/16 0441 08/24/16 0458  Weight: 88.3 kg (194 lb 11.2 oz) 95.7 kg (211 lb) 109.9 kg (242 lb 4.8 oz)    History of present illness:  Clayton [name] is a 65 y.o. with a history of hypertension, hyperlipidemia, CKD, combined systolic and diastolic heart failure, diabetes mellitus type 2, depression, schizophrenia, GERD, recurrent pneumonia. He presented to the emergency department with dyspnea and was found to have a right-sided pneumonia.  Hospital Course:   Right upper and basilar healthcare associated pneumonia  Patient is stable. Afebrile. Urine Strep is negative.Urine for Legionella is negative. Blood cultures x 2 are negative till date. Marland Kitchen  He was started on vancomycin IV and cefepime IV  will  discharge on Augmentin 1 tab po bid x 7 more days     Acute on chronic diastolic heart failure  Electrocardiogram significant for EF of 55-60% with diastolic dysfunction  Improved with Lasix 40 mg twice a day by mouth daily  Clinically at baseline  Continue lasix 40 mg po daily.  Hypertension  Continue diltiazem 240 mg daily  Continue Imdur 30 mg daily  Acute on chronic kidney disease, stage III  Patient baseline creatinine is around 2  Today creatinine is 2.70, will need to repeat BMP in one week  Anemia  Baseline appears to be 8 and 9. He was 7.8 on admission and has dropped down to 6.7   -Transfused 1 unit of blood,   - hemoglobin was 7.5 on 08/23/16  Type 2 diabetes  -continue Levemir and sliding scale insulin.  GERD  -Continue PPI  Schizophrenia  Patient was seen by psychiatry  Depakote level was less than 10.   Changed the dose of Depakote to 500 mg po TID  Continue haldol 1 mg po bid.  Started on zoloft 25 mg po daily  Prolonged QTc  Patient on haldol, EKG showed QTc of 499  Check EKG  In one week  Procedures:  None  Consultations:  Psychiatry   Discharge Exam: Vitals:   08/24/16 0458 08/24/16 1100  BP: (!) 149/80 (!) 175/84  Pulse: 87   Resp: 18   Temp: 99.1 F (37.3 C)     General: Appears in no acute distress Cardiovascular: RRR, no MRG. Trace pedal edema Respiratory: Clear bilaterally, normal respiratory  effort  Discharge Instructions   Discharge Instructions    Diet - low sodium heart healthy    Complete by:  As directed    Increase activity slowly    Complete by:  As directed      Current Discharge Medication List    START taking these medications   Details  amoxicillin-clavulanate (AUGMENTIN) 875-125 MG tablet Take 1 tablet by mouth 2 (two) times daily. Qty: 14 tablet, Refills: 0    Zoloft 25 mg po daily   CONTINUE these medications which have CHANGED   Details  divalproex (DEPAKOTE SPRINKLE)  125 MG capsule Take 4 capsules (500 mg total) by mouth 3 (three) times daily.    haloperidol (HALDOL) 1 MG tablet Take 1 tablet (1 mg total) by mouth 2 (two) times daily. Qty: 10 tablet, Refills: 0    LORazepam (ATIVAN) 0.5 MG tablet Take 1 tablet (0.5 mg total) by mouth 2 (two) times daily as needed for anxiety (breakthrough anxiety). Qty: 30 tablet, Refills: 0      CONTINUE these medications which have NOT CHANGED   Details  diltiazem (CARTIA XT) 240 MG 24 hr capsule Take 1 capsule (240 mg total) by mouth daily. Qty: 30 capsule, Refills: 0    furosemide (LASIX) 40 MG tablet Take 1 tablet (40 mg total) by mouth 2 (two) times daily. Qty: 60 tablet, Refills: 0    guaiFENesin (MUCINEX) 600 MG 12 hr tablet Take 600 mg by mouth 2 (two) times daily as needed for cough or to loosen phlegm.    hydrALAZINE (APRESOLINE) 50 MG tablet Take 1 tablet (50 mg total) by mouth 3 (three) times daily.    insulin detemir (LEVEMIR) 100 UNIT/ML injection Inject 0.25 mLs (25 Units total) into the skin daily.    insulin lispro (HUMALOG) 100 UNIT/ML injection Inject 2-10 Units into the skin 3 (three) times daily with meals. Per sliding scale: 201-250= 2 units; 251-300= 4 units; 301-350= 6 units; 351-400= 8 units; if above 401= 10 units, recheck in 1 hours if still above 401 call MD.    ipratropium-albuterol (DUONEB) 0.5-2.5 (3) MG/3ML SOLN Take 3 mLs by nebulization every 4 (four) hours as needed.    isosorbide mononitrate (IMDUR) 30 MG 24 hr tablet Take 1 tablet (30 mg total) by mouth daily. Qty: 30 tablet, Refills: 0    omeprazole (PRILOSEC) 20 MG capsule Take 20 mg by mouth daily at 6 (six) AM.     ranitidine (ZANTAC) 150 MG tablet Take 150 mg by mouth 2 (two) times daily.    senna (SENOKOT) 8.6 MG tablet Take 2 tablets by mouth at bedtime.    ferrous sulfate 325 (65 FE) MG tablet Take 1 tablet (325 mg total) by mouth daily with breakfast. Qty: 30 tablet, Refills: 0      STOP taking these  medications     haloperidol lactate (HALDOL) 5 MG/ML injection      promethazine (PHENERGAN) 25 MG/ML injection        Allergies  Allergen Reactions  . Latex Rash      The results of significant diagnostics from this hospitalization (including imaging, microbiology, ancillary and laboratory) are listed below for reference.    Significant Diagnostic Studies: Dg Chest 2 View  Result Date: 08/22/2016 CLINICAL DATA:  Shortness of breath cough, healthcare associated pneumonia. Acute on chronic renal insufficiency, diabetes, acute and. Chronic CHF EXAM: CHEST  2 VIEW COMPARISON:  PA and lateral chest x-ray of August 21, 2016 FINDINGS: The lungs are borderline hypoinflated.  Alveolar opacity persists in the right upper and lower lobe. Interstitial prominence persists elsewhere in the right lung and throughout the left lung. There is no significant pleural effusion. The cardiac silhouette is enlarged. The pulmonary vascularity is mildly prominent. IMPRESSION: Bilateral pneumonia most conspicuous on the right, not greatly changed since yesterday's study. Stable cardiomegaly with mild central pulmonary vascular congestion. Electronically Signed   By: David  SwazilandJordan M.D.   On: 08/22/2016 07:43   Dg Chest 2 View  Result Date: 08/21/2016 CLINICAL DATA:  Cough and shortness of breath for 1 week, history hypertension, type II diabetes mellitus, chronic diastolic heart failure, GERD EXAM: CHEST  2 VIEW COMPARISON:  06/18/2016 FINDINGS: Enlargement of cardiac silhouette with pulmonary vascular congestion. RIGHT upper and RIGHT basilar infiltrates compatible with pneumonia. Minimal atelectasis versus scarring at medial LEFT lower lobe. No pneumothorax or gross pleural effusion. Bones unremarkable. IMPRESSION: Enlargement of cardiac silhouette with pulmonary vascular congestion. RIGHT upper lobe and RIGHT basilar infiltrates compatible with pneumonia. Atelectasis versus scarring at medial LEFT lower lobe  unchanged. Electronically Signed   By: Ulyses SouthwardMark  Boles M.D.   On: 08/21/2016 10:21    Microbiology: Recent Results (from the past 240 hour(s))  Blood culture (routine x 2)     Status: None (Preliminary result)   Collection Time: 08/21/16 11:20 AM  Result Value Ref Range Status   Specimen Description BLOOD LEFT HAND  Final   Special Requests BOTTLES DRAWN AEROBIC AND ANAEROBIC 5CC  Final   Culture NO GROWTH 2 DAYS  Final   Report Status PENDING  Incomplete  Blood culture (routine x 2)     Status: None (Preliminary result)   Collection Time: 08/21/16 11:35 AM  Result Value Ref Range Status   Specimen Description BLOOD RIGHT HAND  Final   Special Requests BOTTLES DRAWN AEROBIC AND ANAEROBIC 5CC  Final   Culture NO GROWTH 2 DAYS  Final   Report Status PENDING  Incomplete  Respiratory Panel by PCR     Status: None   Collection Time: 08/21/16  2:17 PM  Result Value Ref Range Status   Adenovirus NOT DETECTED NOT DETECTED Final   Coronavirus 229E NOT DETECTED NOT DETECTED Final   Coronavirus HKU1 NOT DETECTED NOT DETECTED Final   Coronavirus NL63 NOT DETECTED NOT DETECTED Final   Coronavirus OC43 NOT DETECTED NOT DETECTED Final   Metapneumovirus NOT DETECTED NOT DETECTED Final   Rhinovirus / Enterovirus NOT DETECTED NOT DETECTED Final   Influenza A NOT DETECTED NOT DETECTED Final   Influenza B NOT DETECTED NOT DETECTED Final   Parainfluenza Virus 1 NOT DETECTED NOT DETECTED Final   Parainfluenza Virus 2 NOT DETECTED NOT DETECTED Final   Parainfluenza Virus 3 NOT DETECTED NOT DETECTED Final   Parainfluenza Virus 4 NOT DETECTED NOT DETECTED Final   Respiratory Syncytial Virus NOT DETECTED NOT DETECTED Final   Bordetella pertussis NOT DETECTED NOT DETECTED Final   Chlamydophila pneumoniae NOT DETECTED NOT DETECTED Final   Mycoplasma pneumoniae NOT DETECTED NOT DETECTED Final  Urine culture     Status: None   Collection Time: 08/22/16  5:17 AM  Result Value Ref Range Status   Specimen  Description URINE, RANDOM  Final   Special Requests NONE  Final   Culture NO GROWTH  Final   Report Status 08/23/2016 FINAL  Final  MRSA PCR Screening     Status: None   Collection Time: 08/22/16  7:53 AM  Result Value Ref Range Status   MRSA by PCR NEGATIVE NEGATIVE  Final    Comment:        The GeneXpert MRSA Assay (FDA approved for NASAL specimens only), is one component of a comprehensive MRSA colonization surveillance program. It is not intended to diagnose MRSA infection nor to guide or monitor treatment for MRSA infections.      Labs: Basic Metabolic Panel:  Recent Labs Lab 08/21/16 1132 08/22/16 0728 08/23/16 0505 08/24/16 0228  NA 138 137 134* 133*  K 4.6 4.3 4.5 4.5  CL 107 105 101 101  CO2 24 25 23 25   GLUCOSE 61* 263* 235* 239*  BUN 46* 47* 49* 47*  CREATININE 2.35* 2.44* 2.53* 2.70*  CALCIUM 9.0 8.5* 8.4* 8.2*   Liver Function Tests:  Recent Labs Lab 08/22/16 0728  AST 14*  ALT 16*  ALKPHOS 61  BILITOT 0.5  PROT 5.4*  ALBUMIN 2.3*   CBC:  Recent Labs Lab 08/21/16 1132 08/22/16 0728 08/22/16 0846 08/23/16 0505  WBC 9.3 6.0  --  5.8  HGB 7.8* 6.7* 6.8* 7.5*  HCT 23.9* 21.0* 21.3* 23.4*  MCV 92.3 92.1  --  90.7  PLT 210 199  --  196   BNP: BNP (last 3 results)  Recent Labs  01/09/16 1559 01/28/16 2207 08/21/16 1135  BNP 500.3* 457.9* 338.0*    ProBNP (last 3 results) No results for input(s): PROBNP in the last 8760 hours.  CBG:  Recent Labs Lab 08/22/16 2200 08/23/16 0659 08/23/16 1138 08/23/16 1548 08/24/16 0552  GLUCAP 343* 218* 254* 284* 207*       Signed:  Meredeth Ide MD.  [facility] 08/24/2016, 11:23 AM

## 2016-08-26 LAB — CULTURE, BLOOD (ROUTINE X 2)
Culture: NO GROWTH
Culture: NO GROWTH

## 2016-08-26 LAB — GLUCOSE, CAPILLARY: GLUCOSE-CAPILLARY: 254 mg/dL — AB (ref 65–99)

## 2016-09-06 ENCOUNTER — Emergency Department (HOSPITAL_COMMUNITY): Payer: Medicaid Other

## 2016-09-06 ENCOUNTER — Inpatient Hospital Stay (HOSPITAL_COMMUNITY)
Admission: EM | Admit: 2016-09-06 | Discharge: 2016-10-09 | DRG: 291 | Disposition: E | Payer: Medicaid Other | Attending: Pulmonary Disease | Admitting: Pulmonary Disease

## 2016-09-06 ENCOUNTER — Encounter (HOSPITAL_COMMUNITY): Payer: Self-pay

## 2016-09-06 DIAGNOSIS — J690 Pneumonitis due to inhalation of food and vomit: Secondary | ICD-10-CM | POA: Diagnosis present

## 2016-09-06 DIAGNOSIS — I5023 Acute on chronic systolic (congestive) heart failure: Secondary | ICD-10-CM

## 2016-09-06 DIAGNOSIS — Z833 Family history of diabetes mellitus: Secondary | ICD-10-CM

## 2016-09-06 DIAGNOSIS — I249 Acute ischemic heart disease, unspecified: Secondary | ICD-10-CM | POA: Diagnosis present

## 2016-09-06 DIAGNOSIS — E872 Acidosis: Secondary | ICD-10-CM | POA: Diagnosis present

## 2016-09-06 DIAGNOSIS — J80 Acute respiratory distress syndrome: Secondary | ICD-10-CM | POA: Diagnosis present

## 2016-09-06 DIAGNOSIS — N183 Chronic kidney disease, stage 3 (moderate): Secondary | ICD-10-CM | POA: Diagnosis present

## 2016-09-06 DIAGNOSIS — H5704 Mydriasis: Secondary | ICD-10-CM | POA: Diagnosis present

## 2016-09-06 DIAGNOSIS — I462 Cardiac arrest due to underlying cardiac condition: Secondary | ICD-10-CM | POA: Diagnosis present

## 2016-09-06 DIAGNOSIS — F209 Schizophrenia, unspecified: Secondary | ICD-10-CM | POA: Diagnosis present

## 2016-09-06 DIAGNOSIS — K219 Gastro-esophageal reflux disease without esophagitis: Secondary | ICD-10-CM | POA: Diagnosis present

## 2016-09-06 DIAGNOSIS — J9601 Acute respiratory failure with hypoxia: Secondary | ICD-10-CM

## 2016-09-06 DIAGNOSIS — Z66 Do not resuscitate: Secondary | ICD-10-CM | POA: Diagnosis present

## 2016-09-06 DIAGNOSIS — J9600 Acute respiratory failure, unspecified whether with hypoxia or hypercapnia: Secondary | ICD-10-CM | POA: Diagnosis not present

## 2016-09-06 DIAGNOSIS — R402112 Coma scale, eyes open, never, at arrival to emergency department: Secondary | ICD-10-CM | POA: Diagnosis present

## 2016-09-06 DIAGNOSIS — R402312 Coma scale, best motor response, none, at arrival to emergency department: Secondary | ICD-10-CM | POA: Diagnosis present

## 2016-09-06 DIAGNOSIS — I469 Cardiac arrest, cause unspecified: Secondary | ICD-10-CM

## 2016-09-06 DIAGNOSIS — Z9119 Patient's noncompliance with other medical treatment and regimen: Secondary | ICD-10-CM

## 2016-09-06 DIAGNOSIS — K7201 Acute and subacute hepatic failure with coma: Secondary | ICD-10-CM | POA: Diagnosis present

## 2016-09-06 DIAGNOSIS — Z01818 Encounter for other preprocedural examination: Secondary | ICD-10-CM

## 2016-09-06 DIAGNOSIS — G936 Cerebral edema: Secondary | ICD-10-CM | POA: Diagnosis present

## 2016-09-06 DIAGNOSIS — E1122 Type 2 diabetes mellitus with diabetic chronic kidney disease: Secondary | ICD-10-CM | POA: Diagnosis present

## 2016-09-06 DIAGNOSIS — I471 Supraventricular tachycardia: Secondary | ICD-10-CM | POA: Diagnosis not present

## 2016-09-06 DIAGNOSIS — Z794 Long term (current) use of insulin: Secondary | ICD-10-CM

## 2016-09-06 DIAGNOSIS — Z79899 Other long term (current) drug therapy: Secondary | ICD-10-CM | POA: Diagnosis not present

## 2016-09-06 DIAGNOSIS — N179 Acute kidney failure, unspecified: Secondary | ICD-10-CM | POA: Diagnosis present

## 2016-09-06 DIAGNOSIS — G931 Anoxic brain damage, not elsewhere classified: Secondary | ICD-10-CM | POA: Diagnosis present

## 2016-09-06 DIAGNOSIS — Z452 Encounter for adjustment and management of vascular access device: Secondary | ICD-10-CM

## 2016-09-06 DIAGNOSIS — E1165 Type 2 diabetes mellitus with hyperglycemia: Secondary | ICD-10-CM | POA: Diagnosis present

## 2016-09-06 DIAGNOSIS — F329 Major depressive disorder, single episode, unspecified: Secondary | ICD-10-CM | POA: Diagnosis present

## 2016-09-06 DIAGNOSIS — L8990 Pressure ulcer of unspecified site, unspecified stage: Secondary | ICD-10-CM | POA: Diagnosis present

## 2016-09-06 DIAGNOSIS — D649 Anemia, unspecified: Secondary | ICD-10-CM | POA: Diagnosis present

## 2016-09-06 DIAGNOSIS — I130 Hypertensive heart and chronic kidney disease with heart failure and stage 1 through stage 4 chronic kidney disease, or unspecified chronic kidney disease: Principal | ICD-10-CM | POA: Diagnosis present

## 2016-09-06 DIAGNOSIS — R402212 Coma scale, best verbal response, none, at arrival to emergency department: Secondary | ICD-10-CM | POA: Diagnosis present

## 2016-09-06 DIAGNOSIS — F17210 Nicotine dependence, cigarettes, uncomplicated: Secondary | ICD-10-CM | POA: Diagnosis present

## 2016-09-06 DIAGNOSIS — G9382 Brain death: Secondary | ICD-10-CM | POA: Diagnosis present

## 2016-09-06 LAB — I-STAT ARTERIAL BLOOD GAS, ED
ACID-BASE DEFICIT: 11 mmol/L — AB (ref 0.0–2.0)
BICARBONATE: 19 mmol/L — AB (ref 20.0–28.0)
O2 Saturation: 100 %
TCO2: 21 mmol/L (ref 0–100)
pCO2 arterial: 62.7 mmHg — ABNORMAL HIGH (ref 32.0–48.0)
pH, Arterial: 7.088 — CL (ref 7.350–7.450)
pO2, Arterial: 427 mmHg — ABNORMAL HIGH (ref 83.0–108.0)

## 2016-09-06 LAB — I-STAT CHEM 8, ED
BUN: 54 mg/dL — AB (ref 6–20)
CHLORIDE: 107 mmol/L (ref 101–111)
Calcium, Ion: 1.05 mmol/L — ABNORMAL LOW (ref 1.15–1.40)
Creatinine, Ser: 3 mg/dL — ABNORMAL HIGH (ref 0.61–1.24)
Glucose, Bld: 279 mg/dL — ABNORMAL HIGH (ref 65–99)
HEMATOCRIT: 25 % — AB (ref 39.0–52.0)
Hemoglobin: 8.5 g/dL — ABNORMAL LOW (ref 13.0–17.0)
POTASSIUM: 4.5 mmol/L (ref 3.5–5.1)
SODIUM: 140 mmol/L (ref 135–145)
TCO2: 18 mmol/L (ref 0–100)

## 2016-09-06 LAB — URINE MICROSCOPIC-ADD ON

## 2016-09-06 LAB — URINALYSIS, ROUTINE W REFLEX MICROSCOPIC
BILIRUBIN URINE: NEGATIVE
GLUCOSE, UA: 100 mg/dL — AB
HGB URINE DIPSTICK: NEGATIVE
Ketones, ur: NEGATIVE mg/dL
Leukocytes, UA: NEGATIVE
Nitrite: NEGATIVE
PROTEIN: 100 mg/dL — AB
Specific Gravity, Urine: 1.017 (ref 1.005–1.030)
pH: 5.5 (ref 5.0–8.0)

## 2016-09-06 LAB — CREATININE, SERUM
Creatinine, Ser: 3.51 mg/dL — ABNORMAL HIGH (ref 0.61–1.24)
GFR calc non Af Amer: 17 mL/min — ABNORMAL LOW (ref >60)
GFR, EST AFRICAN AMERICAN: 20 mL/min — AB (ref >60)

## 2016-09-06 LAB — GLUCOSE, CAPILLARY
Glucose-Capillary: 298 mg/dL — ABNORMAL HIGH (ref 65–99)
Glucose-Capillary: 319 mg/dL — ABNORMAL HIGH (ref 65–99)

## 2016-09-06 LAB — DIFFERENTIAL
BASOS ABS: 0 10*3/uL (ref 0.0–0.1)
Basophils Relative: 0 %
EOS PCT: 1 %
Eosinophils Absolute: 0.1 10*3/uL (ref 0.0–0.7)
LYMPHS ABS: 1.5 10*3/uL (ref 0.7–4.0)
Lymphocytes Relative: 13 %
MONO ABS: 0.6 10*3/uL (ref 0.1–1.0)
Monocytes Relative: 5 %
Neutro Abs: 9.7 10*3/uL — ABNORMAL HIGH (ref 1.7–7.7)
Neutrophils Relative %: 81 %
WBC Morphology: INCREASED

## 2016-09-06 LAB — CBC
HCT: 26.1 % — ABNORMAL LOW (ref 39.0–52.0)
HEMATOCRIT: 24.9 % — AB (ref 39.0–52.0)
HEMOGLOBIN: 7.7 g/dL — AB (ref 13.0–17.0)
Hemoglobin: 7.8 g/dL — ABNORMAL LOW (ref 13.0–17.0)
MCH: 28.9 pg (ref 26.0–34.0)
MCH: 28.9 pg (ref 26.0–34.0)
MCHC: 29.9 g/dL — ABNORMAL LOW (ref 30.0–36.0)
MCHC: 30.9 g/dL (ref 30.0–36.0)
MCV: 96.7 fL (ref 78.0–100.0)
MCV: 93.6 fL (ref 78.0–100.0)
PLATELETS: 326 10*3/uL (ref 150–400)
PLATELETS: 262 10*3/uL (ref 150–400)
RBC: 2.7 MIL/uL — ABNORMAL LOW (ref 4.22–5.81)
RBC: 2.66 MIL/uL — AB (ref 4.22–5.81)
RDW: 14.4 % (ref 11.5–15.5)
RDW: 14.2 % (ref 11.5–15.5)
WBC: 11.9 10*3/uL — AB (ref 4.0–10.5)
WBC: 4.9 10*3/uL (ref 4.0–10.5)

## 2016-09-06 LAB — COMPREHENSIVE METABOLIC PANEL
ALT: 282 U/L — ABNORMAL HIGH (ref 17–63)
AST: 254 U/L — AB (ref 15–41)
Albumin: 2 g/dL — ABNORMAL LOW (ref 3.5–5.0)
Alkaline Phosphatase: 115 U/L (ref 38–126)
Anion gap: 13 (ref 5–15)
BILIRUBIN TOTAL: 0.5 mg/dL (ref 0.3–1.2)
BUN: 52 mg/dL — AB (ref 6–20)
CALCIUM: 7.8 mg/dL — AB (ref 8.9–10.3)
CO2: 16 mmol/L — ABNORMAL LOW (ref 22–32)
CREATININE: 3.33 mg/dL — AB (ref 0.61–1.24)
Chloride: 110 mmol/L (ref 101–111)
GFR calc Af Amer: 21 mL/min — ABNORMAL LOW (ref >60)
GFR, EST NON AFRICAN AMERICAN: 18 mL/min — AB (ref >60)
Glucose, Bld: 288 mg/dL — ABNORMAL HIGH (ref 65–99)
POTASSIUM: 4.5 mmol/L (ref 3.5–5.1)
Sodium: 139 mmol/L (ref 135–145)
TOTAL PROTEIN: 5.5 g/dL — AB (ref 6.5–8.1)

## 2016-09-06 LAB — I-STAT TROPONIN, ED: TROPONIN I, POC: 0.4 ng/mL — AB (ref 0.00–0.08)

## 2016-09-06 LAB — APTT: aPTT: 42 seconds — ABNORMAL HIGH (ref 24–36)

## 2016-09-06 LAB — CBG MONITORING, ED: Glucose-Capillary: 248 mg/dL — ABNORMAL HIGH (ref 65–99)

## 2016-09-06 LAB — PROTIME-INR
INR: 1.29
Prothrombin Time: 16.1 seconds — ABNORMAL HIGH (ref 11.4–15.2)

## 2016-09-06 LAB — LIPID PANEL
Cholesterol: 184 mg/dL (ref 0–200)
HDL: 49 mg/dL (ref >40)
LDL Cholesterol: 124 mg/dL — ABNORMAL HIGH (ref 0–99)
Total CHOL/HDL Ratio: 3.8 RATIO
Triglycerides: 56 mg/dL (ref <150)
VLDL: 11 mg/dL (ref 0–40)

## 2016-09-06 LAB — TROPONIN I: TROPONIN I: 0.24 ng/mL — AB (ref <0.03)

## 2016-09-06 LAB — MRSA PCR SCREENING: MRSA BY PCR: NEGATIVE

## 2016-09-06 LAB — I-STAT CG4 LACTIC ACID, ED: Lactic Acid, Venous: 7.48 mmol/L (ref 0.5–1.9)

## 2016-09-06 LAB — LACTIC ACID, PLASMA: LACTIC ACID, VENOUS: 6.4 mmol/L — AB (ref 0.5–1.9)

## 2016-09-06 MED ORDER — CHLORHEXIDINE GLUCONATE 0.12% ORAL RINSE (MEDLINE KIT)
15.0000 mL | Freq: Two times a day (BID) | OROMUCOSAL | Status: DC
  Administered 2016-09-06 – 2016-09-08 (×4): 15 mL via OROMUCOSAL

## 2016-09-06 MED ORDER — NOREPINEPHRINE BITARTRATE 1 MG/ML IV SOLN: 0.0000 ug/min | Freq: Once | INTRAVENOUS | Status: DC

## 2016-09-06 MED ORDER — HEPARIN SODIUM (PORCINE) 5000 UNIT/ML IJ SOLN
5000.0000 [IU] | Freq: Three times a day (TID) | INTRAMUSCULAR | Status: DC
  Administered 2016-09-06 – 2016-09-08 (×5): 5000 [IU] via SUBCUTANEOUS
  Filled 2016-09-06 (×5): qty 1

## 2016-09-06 MED ORDER — EPINEPHRINE HCL 0.1 MG/ML IJ SOSY
PREFILLED_SYRINGE | INTRAMUSCULAR | Status: AC | PRN
  Administered 2016-09-06: 1 mg via INTRAVENOUS
  Administered 2016-09-06: 0.3 mg via INTRAVENOUS
  Administered 2016-09-06: 5 mg via INTRAVENOUS

## 2016-09-06 MED ORDER — ORAL CARE MOUTH RINSE
15.0000 mL | OROMUCOSAL | Status: DC
  Administered 2016-09-06 – 2016-09-08 (×17): 15 mL via OROMUCOSAL

## 2016-09-06 MED ORDER — ASPIRIN 300 MG RE SUPP: 300.0000 mg | RECTAL | Status: AC

## 2016-09-06 MED ORDER — SODIUM CHLORIDE 0.9 % IV SOLN
2000.0000 mg | Freq: Once | INTRAVENOUS | Status: AC
  Administered 2016-09-06: 2000 mg via INTRAVENOUS
  Filled 2016-09-06: qty 2000

## 2016-09-06 MED ORDER — ASPIRIN 81 MG PO CHEW
324.0000 mg | CHEWABLE_TABLET | ORAL | Status: AC
  Administered 2016-09-06: 324 mg via ORAL
  Filled 2016-09-06: qty 4

## 2016-09-06 MED ORDER — INSULIN ASPART 100 UNIT/ML ~~LOC~~ SOLN
0.0000 [IU] | SUBCUTANEOUS | Status: DC
  Administered 2016-09-06: 7 [IU] via SUBCUTANEOUS
  Administered 2016-09-06: 3 [IU] via SUBCUTANEOUS
  Administered 2016-09-07: 1 [IU] via SUBCUTANEOUS
  Administered 2016-09-07: 5 [IU] via SUBCUTANEOUS
  Administered 2016-09-07: 3 [IU] via SUBCUTANEOUS
  Administered 2016-09-07: 1 [IU] via SUBCUTANEOUS
  Administered 2016-09-07: 5 [IU] via SUBCUTANEOUS
  Administered 2016-09-07 – 2016-09-08 (×2): 2 [IU] via SUBCUTANEOUS
  Administered 2016-09-08: 3 [IU] via SUBCUTANEOUS
  Administered 2016-09-08: 1 [IU] via SUBCUTANEOUS
  Filled 2016-09-06: qty 1

## 2016-09-06 MED ORDER — FAMOTIDINE 40 MG/5ML PO SUSR
20.0000 mg | Freq: Two times a day (BID) | ORAL | Status: DC
Start: 2016-09-06 — End: 2016-09-08
  Administered 2016-09-06 – 2016-09-08 (×4): 20 mg
  Filled 2016-09-06 (×5): qty 2.5

## 2016-09-06 MED ORDER — PIPERACILLIN-TAZOBACTAM 3.375 G IVPB 30 MIN
3.3750 g | Freq: Once | INTRAVENOUS | Status: AC
  Administered 2016-09-06: 3.375 g via INTRAVENOUS
  Filled 2016-09-06: qty 50

## 2016-09-06 MED ORDER — PIPERACILLIN-TAZOBACTAM 3.375 G IVPB
3.3750 g | Freq: Three times a day (TID) | INTRAVENOUS | Status: DC
  Administered 2016-09-06 – 2016-09-08 (×5): 3.375 g via INTRAVENOUS
  Filled 2016-09-06 (×8): qty 50

## 2016-09-06 MED ORDER — VANCOMYCIN HCL 10 G IV SOLR
1500.0000 mg | INTRAVENOUS | Status: DC
  Filled 2016-09-06: qty 1500

## 2016-09-06 MED ORDER — HYDRALAZINE HCL 20 MG/ML IJ SOLN
10.0000 mg | Freq: Four times a day (QID) | INTRAMUSCULAR | Status: DC | PRN
  Administered 2016-09-06: 20 mg via INTRAVENOUS
  Filled 2016-09-06: qty 1

## 2016-09-06 MED ORDER — SODIUM CHLORIDE 0.9 % IV SOLN: 250.0000 mL | INTRAVENOUS | Status: DC | PRN

## 2016-09-06 MED FILL — Medication: Qty: 1 | Status: AC

## 2016-09-06 NOTE — Progress Notes (Signed)
ETT retracted back 3cm from 28 to 25 at the lip per Dr Clydene PughKnott. Patient had return volumes and O2 sat of 96. RT will continue to monitor.

## 2016-09-06 NOTE — H&P (Signed)
PULMONARY / CRITICAL CARE MEDICINE   Name: Clayton [name] MRN: [id number] DOB: Feb 15, 1951    ADMISSION DATE:  08/26/2016 CONSULTATION DATE:  08/18/2016  REFERRING MD:  Dr. Clydene PughKnott  CHIEF COMPLAINT:  Cardiac arrest  HISTORY OF PRESENT ILLNESS:   65 year old male with a past medical history significant for congestive heart failure, DVT was found down in his nursing home in respiratory distress. CPR was initiated by fire who [facility]. EMS arrived after an undetermined amount of CPR. They initiated ACLS measures and [facility] It is uncertain how many minutes of CPR total he received but it is known that he received at least 8 rounds of epinephrine per ACLS protocol. [facility] he was unresponsive with pupils that were fixed and dilated. He had PEA arrest.  He was noted to have thick secretions from his airway on admission.  PAST MEDICAL HISTORY :  He  has a past medical history of Anemia; CHF (congestive heart failure) (HCC); CKD (chronic kidney disease), stage III; Depression; DVT (deep venous thrombosis) (HCC); GERD (gastroesophageal reflux disease); HTN (hypertension); Medical non-compliance; Migraine; Pneumonia (12/06/2015); Polysubstance abuse; Schizophrenia (HCC); and Type II diabetes mellitus (HCC).  PAST SURGICAL HISTORY: He  has a past surgical history that includes No past surgeries.  Allergies  Allergen Reactions  . Latex Rash    No current facility-administered medications on file prior to encounter.    Current Outpatient Prescriptions on File Prior to Encounter  Medication Sig  . diltiazem (CARTIA XT) 240 MG 24 hr capsule Take 1 capsule (240 mg total) by mouth daily.  . divalproex (DEPAKOTE SPRINKLE) 125 MG capsule Take 4 capsules (500 mg total) by mouth 3 (three) times daily.  . ferrous sulfate 325 (65 FE) MG tablet Take 1 tablet (325 mg total) by mouth daily with breakfast.  . furosemide (LASIX) 40 MG tablet Take  1 tablet (40 mg total) by mouth 2 (two) times daily.  Marland Kitchen. guaiFENesin (MUCINEX) 600 MG 12 hr tablet Take 600 mg by mouth 2 (two) times daily as needed for cough or to loosen phlegm.  . haloperidol (HALDOL) 1 MG tablet Take 1 tablet (1 mg total) by mouth 2 (two) times daily.  . hydrALAZINE (APRESOLINE) 50 MG tablet Take 1 tablet (50 mg total) by mouth 3 (three) times daily.  . insulin detemir (LEVEMIR) 100 UNIT/ML injection Inject 0.25 mLs (25 Units total) into the skin daily.  . insulin lispro (HUMALOG) 100 UNIT/ML injection Inject 2-10 Units into the skin 3 (three) times daily with meals. Per sliding scale: 201-250= 2 units; 251-300= 4 units; 301-350= 6 units; 351-400= 8 units; if above 401= 10 units, recheck in 1 hours if still above 401 call MD.  . ipratropium-albuterol (DUONEB) 0.5-2.5 (3) MG/3ML SOLN Take 3 mLs by nebulization every 4 (four) hours as needed. (Patient taking differently: Take 3 mLs by nebulization every 4 (four) hours as needed (shortness of breath). )  . isosorbide mononitrate (IMDUR) 30 MG 24 hr tablet Take 1 tablet (30 mg total) by mouth daily.  Marland Kitchen. LORazepam (ATIVAN) 0.5 MG tablet Take 1 tablet (0.5 mg total) by mouth 2 (two) times daily as needed for anxiety (breakthrough anxiety).  Marland Kitchen. omeprazole (PRILOSEC) 20 MG capsule Take 20 mg by mouth daily at 6 (six) AM.   . ranitidine (ZANTAC) 150 MG tablet Take 150 mg by mouth 2 (two) times daily.  Marland Kitchen. senna (SENOKOT) 8.6 MG tablet Take 2 tablets by mouth  at bedtime.  . sertraline (ZOLOFT) 25 MG tablet Take 1 tablet (25 mg total) by mouth daily.    FAMILY HISTORY:  His indicated that the status of his mother is unknown. He indicated that the status of his father is unknown. He indicated that the status of his maternal grandfather is unknown.    SOCIAL HISTORY: He  reports that he has been smoking Cigarettes.  He has a 12.00 pack-year smoking history. He has never used smokeless tobacco. He reports that he drinks alcohol. He reports  that he uses drugs, including "Crack" cocaine and Marijuana.  REVIEW OF SYSTEMS:   Cannot obtain due to intubation  SUBJECTIVE:  As above  VITAL SIGNS: BP 145/67   Pulse 101   Resp 15   Ht 6\' 2"  (1.88 m)   Wt 242 lb (109.8 kg)   SpO2 96%   BMI 31.07 kg/m   HEMODYNAMICS:    VENTILATOR SETTINGS: Vent Mode: PRVC FiO2 (%):  [100 %] 100 % Set Rate:  [15 bmp] 15 bmp Vt Set:  [500 mL] 500 mL PEEP:  [10 cmH20] 10 cmH20 Plateau Pressure:  [25 cmH20] 25 cmH20  INTAKE / OUTPUT: No intake/output data recorded.  PHYSICAL EXAMINATION: General:  On vent Neuro:  No response to external stimuli, GCS 3 HEENT:  Normocephalic atraumatic, endotracheal tube in place Cardiovascular:  Regular rate and rhythm, no murmurs gallops or rubs Lungs:  Rhonchi bilaterally Abdomen:  Bowel sounds positive, nontender nondistended Musculoskeletal:  Diminished TONE Skin:  No rash or skin breakdown  LABS:  BMET  Recent Labs Lab Sep 14, 2016 1424  NA 140  K 4.5  CL 107  BUN 54*  CREATININE 3.00*  GLUCOSE 279*    Electrolytes No results for input(s): CALCIUM, MG, PHOS in the last 168 hours.  CBC  Recent Labs Lab September 14, 2016 1416 09/14/2016 1424  WBC 11.9*  --   HGB 7.8* 8.5*  HCT 26.1* 25.0*  PLT 326  --     Coag's  Recent Labs Lab 14-Sep-2016 1416  APTT 42*  INR 1.29    Sepsis Markers  Recent Labs Lab 09-14-2016 1425  LATICACIDVEN 7.48*    ABG No results for input(s): PHART, PCO2ART, PO2ART in the last 168 hours.  Liver Enzymes No results for input(s): AST, ALT, ALKPHOS, BILITOT, ALBUMIN in the last 168 hours.  Cardiac Enzymes No results for input(s): TROPONINI, PROBNP in the last 168 hours.  Glucose No results for input(s): GLUCAP in the last 168 hours.  Imaging No results found.   STUDIES:    CULTURES: September 29 respiratory September 29 blood  ANTIBIOTICS: September 29 vancomycin September 29 Zosyn  SIGNIFICANT EVENTS: September 14, 2016  admission  LINES/TUBES: 09-14-2016 endotracheal tube 14-Sep-2016 left IJ central line  DISCUSSION: 65 year old male with a past medical history significant for schizophrenia, CHF, recent admission for healthcare associated pneumonia with multiple pressure wounds comes from a nursing home with a cardiac arrest. It is believed that this is of respiratory origin. There is no witnessed aspiration though he was noted to have significant respiratory secretions on admission. His total down time is uncertain in our best estimate is that he may receive around 30 minutes of CPR per ACLS protocol but he likely received much longer than that. Currently his physical exam is worrisome for severe anoxic brain injury and he remains in shock.   ASSESSMENT / PLAN:  PULMONARY A: Acute respiratory failure with hypoxemia Recent healthcare associated pneumonia Possible aspiration pneumonia P:   Empiric coverage of pneumonia Respiratory  culture Full ventilatory support Chest x-ray ABG Ventilator associated pneumonia prevention protocol  CARDIOVASCULAR A:  Shock, postarrest Cardiac arrest: PEA arrest due to respiratory failure Troponin elevation post cardiac arrest> presumably  P:  Telemetry monitoring Consider echocardiogram Repeat lactic acid Continue levophed as needed to maintain mean arterial pressure greater than 65  RENAL A:   CKD, at baseline P:   Monitor BMET and UOP Replace electrolytes as needed   GASTROINTESTINAL A:   Shock liver P:   Monitor LFT Hold Tube feeding Pepcid for stress ulcer prophylaxis   HEMATOLOGIC A:   Normocytic anemia without bleeding, uncertain etiology P:  Monitor for bleeding Transfuse if Hgb < 7gm/dL  INFECTIOUS A:   Aspiration pneumonia? P:   CXR now Empiric HCAP coverage  ENDOCRINE A:   DM2 P:   SSI  NEUROLOGIC A:   Acute anoxic encephalopathy, prolonged CPR, prognosis poor P:   Given uncertain downtime, shock, and multiple  advanced chronic diseases will not perform induced hypothermia   FAMILY  - Updates: Called his Child psychotherapist, had to leave a message My cc time 40 minutes  Heber O'Brien, MD [place] [facility] Pager: [phone] Cell: [phone] September 21, 2016, 3:00 PM

## 2016-09-06 NOTE — Procedures (Signed)
Central Venous Catheter Insertion Procedure Note Clayton [name] [id number] 07/11/1951  Procedure: Insertion of Central Venous Catheter Indications: Assessment of intravascular volume and Drug and/or fluid administration  Procedure Details Consent: Unable to obtain consent because of emergent medical necessity. Time Out: Verified patient identification, verified procedure, site/side was marked, verified correct patient position, special equipment/implants available, medications/allergies/relevent history reviewed, required imaging and test results available.  Performed  Maximum sterile technique was used including antiseptics, cap, gloves, gown, hand hygiene, mask and sheet. Skin prep: Chlorhexidine; local anesthetic administered A antimicrobial bonded/coated triple lumen catheter was placed in the left internal jugular vein using the Seldinger technique.  Ultrasound was used to verify the patency of the vein and for real time needle guidance.  Evaluation Blood flow good Complications: No apparent complications Patient did tolerate procedure well. Chest X-ray ordered to verify placement.  CXR: pending.  Clayton [name] 09/02/2016, 2:59 PM

## 2016-09-06 NOTE — Progress Notes (Signed)
eLink Physician-Brief Progress Note Patient Name: Clayton SafeVincent L [name] DOB: 1951/08/18 MRN: [id number]   Date of Service  29-Oct-2016  HPI/Events of Note  Hydralazine added for BP control No responses on neuro exam - poor prognosis  eICU Interventions       Intervention Category Intermediate Interventions: Hypertension - evaluation and management  Lelania Bia S. 29-Oct-2016, 8:28 PM

## 2016-09-06 NOTE — Progress Notes (Addendum)
Pharmacy Antibiotic Note  Clayton [name] is a 65 y.o. male admitted on 08/28/2016 with suspected pneumonia s/p PEA arrest at nursing facility. CPR time 20-25 min. Per med rec tech, received ceftriaxone at nursing facility PTA. Pharmacy has been consulted for vancomycin and zosyn dosing. Temp 101, BP low - on vasopressors. Lactate elevated at 7.48.   Vancomycin 2g IV x1 in ED  Plan: Vancomycin 1500 mg  IV every 48 hours.  Goal trough 15-20 mcg/mL. Zosyn 3.375g IV q8h (4 hour infusion).  Monitor renal function, clinical picture, culture results, and vancomycin trough as needed.  Consider adjusting vancomycin dosing if renal function improves   Height: 6\' 2"  (188 cm) Weight: 242 lb (109.8 kg) IBW/kg (Calculated) : 82.2  Recent Labs Lab 06-25-2016 1416 06-25-2016 1424 06-25-2016 1425  WBC 11.9*  --   --   CREATININE 3.33* 3.00*  --   LATICACIDVEN  --   --  7.48*    Estimated Creatinine Clearance: 33.2 mL/min (by C-G formula based on SCr of 3 mg/dL (H)).    Allergies  Allergen Reactions  . Latex Rash    Antimicrobials this admission: 9/29 Vanc >>  9/29 Zosyn >>   Dose adjustments this admission: n/a  Microbiology results: pending   Thank you for allowing pharmacy to be a part of this patient's care.  York CeriseKatherine Cook, PharmD Pharmacy Resident  Pager [phone] 06-25-2016 3:30 PM

## 2016-09-06 NOTE — ED Triage Notes (Signed)
Pt. Coming from [facility] post CPR. Pt. Noted to be in respiratory distress by tech and when nurse arrived the patient was agonal and pulseless. EMS reports 3x loss of pulses with CPR and 2x epi for each episode. Fire inserted king airway by the time EMS arrived. Pt. Suctioned and thick yellow secretions noted. Pt. Has IO in right tibia. Total of 8 epi given en route. Pupils noted to be fixed and dilated per ED physician.

## 2016-09-06 NOTE — ED Notes (Signed)
Per EMS- total time of CPR was 20-25 minutes prior to hospital arrival.

## 2016-09-06 NOTE — Progress Notes (Signed)
Non-Latex foley catheter placed under sterile technique by Kavin LeechSondra [place] RN and assisted by Hillery AldoShanna Stowe RN.   Patient's penis swollen prior to insertion and when foreskin retracted for peri care, a white milky discharge was present and some bleeding present.  Peri care performed with soap and running water, area dried and then peri care wipes from foley catheter tray were used.  Foley inserted and yellow urine returned.  Foley care wipes used post foley insertion and foreskin returned to natural position.

## 2016-09-06 NOTE — ED Notes (Signed)
CCM at bedside 

## 2016-09-06 NOTE — Code Documentation (Signed)
CCM paged at this time.

## 2016-09-06 NOTE — ED Provider Notes (Signed)
[facility]-EMERGENCY DEPT Provider Note   CSN: [id number] Arrival date & time: 09/10/2016  1346     History   Chief Complaint Chief Complaint  Patient presents with  . Cardiac Arrest    HPI Clayton [name] is a 65 y.o. male.  The history is provided by the EMS personnel.  Illness  This is a new problem. The current episode started less than 1 hour ago. The problem occurs constantly. The problem has not changed since onset.Associated symptoms include shortness of breath (then unresponsiveness and cardiopulmonary arrest).    Past Medical History:  Diagnosis Date  . Anemia   . CHF (congestive heart failure) (HCC)   . CKD (chronic kidney disease), stage III   . Depression   . DVT (deep venous thrombosis) (HCC)    LLE  . GERD (gastroesophageal reflux disease)   . HTN (hypertension)   . Medical non-compliance   . Migraine    "a few times/month" (12/06/2015)  . Pneumonia 12/06/2015  . Polysubstance abuse    cocaine  . Schizophrenia (HCC)   . Type II diabetes mellitus [facility])     Patient Active Problem List   Diagnosis Date Noted  . Pressure ulcer 08/24/2016  . Acute systolic congestive heart failure (HCC)   . HCAP (healthcare-associated pneumonia) 08/21/2016  . Acute renal insufficiency   . Dehydration 06/18/2016  . Nausea & vomiting 06/18/2016  . Schizophrenia (HCC) 06/18/2016  . Chronic diastolic heart failure, NYHA class 1 (HCC) 06/18/2016  . Acute kidney injury (HCC) 06/18/2016  . Essential hypertension   . AKI (acute kidney injury) (HCC) 06/09/2016  . Near syncope 06/09/2016  . Acute pulmonary edema (HCC) 01/09/2016  . Type 2 diabetes mellitus with hyperosmolar nonketotic hyperglycemia (HCC) 12/16/2015  . Prolonged Q-T interval on ECG 12/16/2015  . CKD (chronic kidney disease) 12/11/2015  . Diabetes mellitus with complication (HCC) 12/11/2015  . Homeless 12/11/2015  . Anemia of chronic disease 12/11/2015  . Abnormal chest x-ray 12/06/2015  . Hyperglycemia  12/04/2015  . Hypertensive urgency 12/04/2015  . Cocaine abuse 10/08/2015  . Acute kidney injury superimposed on chronic kidney disease (HCC) 08/17/2015  . Accelerated hypertension   . History of Polysubstance abuse 12/13/2011  . Hyponatremia 12/01/2011    Past Surgical History:  Procedure Laterality Date  . NO PAST SURGERIES         Home Medications    Prior to Admission medications   Medication Sig Start Date End Date Taking? Authorizing Provider  diltiazem (CARTIA XT) 240 MG 24 hr capsule Take 1 capsule (240 mg total) by mouth daily. 01/15/16   Rolly Salter, MD  divalproex (DEPAKOTE SPRINKLE) 125 MG capsule Take 4 capsules (500 mg total) by mouth 3 (three) times daily. 08/24/16   Meredeth Ide, MD  ferrous sulfate 325 (65 FE) MG tablet Take 1 tablet (325 mg total) by mouth daily with breakfast. 12/13/15   Rolly Salter, MD  furosemide (LASIX) 40 MG tablet Take 1 tablet (40 mg total) by mouth 2 (two) times daily. 06/21/16   Barnetta Chapel, MD  guaiFENesin (MUCINEX) 600 MG 12 hr tablet Take 600 mg by mouth 2 (two) times daily as needed for cough or to loosen phlegm.    Historical Provider, MD  haloperidol (HALDOL) 1 MG tablet Take 1 tablet (1 mg total) by mouth 2 (two) times daily. 08/24/16   Meredeth Ide, MD  hydrALAZINE (APRESOLINE) 50 MG tablet Take 1 tablet (50 mg total) by mouth 3 (three) times daily.  01/15/16   Rolly Salter, MD  insulin detemir (LEVEMIR) 100 UNIT/ML injection Inject 0.25 mLs (25 Units total) into the skin daily. 12/24/15   Shanker Levora Dredge, MD  insulin lispro (HUMALOG) 100 UNIT/ML injection Inject 2-10 Units into the skin 3 (three) times daily with meals. Per sliding scale: 201-250= 2 units; 251-300= 4 units; 301-350= 6 units; 351-400= 8 units; if above 401= 10 units, recheck in 1 hours if still above 401 call MD.    Historical Provider, MD  ipratropium-albuterol (DUONEB) 0.5-2.5 (3) MG/3ML SOLN Take 3 mLs by nebulization every 4 (four) hours as needed. Patient  taking differently: Take 3 mLs by nebulization every 4 (four) hours as needed (shortness of breath).  12/24/15   Shanker Levora Dredge, MD  isosorbide mononitrate (IMDUR) 30 MG 24 hr tablet Take 1 tablet (30 mg total) by mouth daily. 01/15/16   Rolly Salter, MD  LORazepam (ATIVAN) 0.5 MG tablet Take 1 tablet (0.5 mg total) by mouth 2 (two) times daily as needed for anxiety (breakthrough anxiety). 08/24/16   Meredeth Ide, MD  omeprazole (PRILOSEC) 20 MG capsule Take 20 mg by mouth daily at 6 (six) AM.     Historical Provider, MD  ranitidine (ZANTAC) 150 MG tablet Take 150 mg by mouth 2 (two) times daily.    Historical Provider, MD  senna (SENOKOT) 8.6 MG tablet Take 2 tablets by mouth at bedtime.    Historical Provider, MD  sertraline (ZOLOFT) 25 MG tablet Take 1 tablet (25 mg total) by mouth daily. 08/24/16   Meredeth Ide, MD    Family History Family History  Problem Relation Age of Onset  . Diabetes Mellitus II Mother   . Diabetes Mellitus II Father   . Stroke Maternal Grandfather     Social History Social History  Substance Use Topics  . Smoking status: Current Every Day Smoker    Packs/day: 0.25    Years: 48.00    Types: Cigarettes  . Smokeless tobacco: Never Used  . Alcohol use Yes     Allergies   Latex   Review of Systems Review of Systems  Unable to perform ROS: Acuity of condition  Respiratory: Positive for shortness of breath (then unresponsiveness and cardiopulmonary arrest).    LEVEL 5 EXCEPTION TO HISTORY   Physical Exam Updated Vital Signs BP 145/67   Pulse 101   Resp 15   Ht 6\' 2"  (1.88 m)   Wt 242 lb (109.8 kg)   SpO2 96%   BMI 31.07 kg/m   Physical Exam  Constitutional: He appears well-developed. He appears toxic. He is intubated (with king airway).  HENT:  Head: Normocephalic and atraumatic.  Eyes:  Pupils irregular, fixed and dilated  Neck: No tracheal deviation present.  Cardiovascular: Regular rhythm.  Tachycardia present.   Pulses:       Femoral pulses are 2+ on the right side, and 2+ on the left side. Pulmonary/Chest: He is intubated (with king airway).  Coarse, equal mechanical breath sounds with bagging, no spontaneous respirations  Abdominal: He exhibits distension. He exhibits no fluid wave.  Musculoskeletal:  Diffuse evident peripheral edema  Neurological: He is unresponsive. He exhibits abnormal muscle tone (diffusely limp). GCS eye subscore is 1. GCS verbal subscore is 1. GCS motor subscore is 1.  Skin: No laceration and no rash noted.     ED Treatments / Results  Labs (all labs ordered are listed, but only abnormal results are displayed) Labs Reviewed  CBC - Abnormal; Notable  for the following:       Result Value   WBC 11.9 (*)    RBC 2.70 (*)    Hemoglobin 7.8 (*)    HCT 26.1 (*)    MCHC 29.9 (*)    All other components within normal limits  PROTIME-INR - Abnormal; Notable for the following:    Prothrombin Time 16.1 (*)    All other components within normal limits  APTT - Abnormal; Notable for the following:    aPTT 42 (*)    All other components within normal limits  LIPID PANEL - Abnormal; Notable for the following:    LDL Cholesterol 124 (*)    All other components within normal limits  I-STAT CHEM 8, ED - Abnormal; Notable for the following:    BUN 54 (*)    Creatinine, Ser 3.00 (*)    Glucose, Bld 279 (*)    Calcium, Ion 1.05 (*)    Hemoglobin 8.5 (*)    HCT 25.0 (*)    All other components within normal limits  I-STAT TROPOININ, ED - Abnormal; Notable for the following:    Troponin i, poc 0.40 (*)    All other components within normal limits  I-STAT CG4 LACTIC ACID, ED - Abnormal; Notable for the following:    Lactic Acid, Venous 7.48 (*)    All other components within normal limits  DIFFERENTIAL  COMPREHENSIVE METABOLIC PANEL  TROPONIN I    EKG  EKG Interpretation  Date/Time:  Friday September 06 2016 14:04:00 EDT Ventricular Rate:  114 PR Interval:    QRS Duration: 157 QT  Interval:  355 QTC Calculation: 489 R Axis:   75 Text Interpretation:  Sinus tachycardia Right bundle branch block No significant change since last tracing Confirmed by Zacherie Honeyman MD, Clifford Benninger [phone]) on 09/01/2016 2:58:43 PM       Radiology Dg Chest Portable 1 View  Result Date: 08/30/2016 CLINICAL DATA:  Endotracheal tube, orogastric tube and central venous catheter placement EXAM: PORTABLE CHEST 1 VIEW COMPARISON:  Chest radiograph 08/22/2016 FINDINGS: A left IJ central venous catheter has been placed with its tip overlying the cavoatrial junction. Orogastric tube courses below the diaphragm with its tip beyond the field of view. Endotracheal tube tip is just above the carina and should be retracted approximately 6 cm. There is cardiomegaly. Bilateral upper lobe predominant opacities with peribronchial cuffing. No pneumothorax or sizable pleural effusion. IMPRESSION: 1. Endotracheal tube tip just above the carina, very close to the right mainstem bronchus. This should be retracted approximately 6 cm for optimal positioning. 2. Cardiomegaly and moderate pulmonary edema. 3. Left IJ central venous catheter tip at the cavoatrial junction. These results will be called to the ordering clinician or representative by the Radiologist Assistant, and communication documented in the PACS or zVision Dashboard. Electronically Signed   By: Deatra Robinson M.D.   On: 09/03/2016 15:24    Procedures Procedures (including critical care time)  CRITICAL CARE Performed by: Lyndal Pulley Total critical care time: 75 minutes Critical care time was exclusive of separately billable procedures and treating other patients. Critical care was necessary to treat or prevent imminent or life-threatening deterioration. Critical care was time spent personally by me on the following activities: development of treatment plan with patient and/or surrogate as well as nursing, discussions with consultants, evaluation of patient's response  to treatment, examination of patient, obtaining history from patient or surrogate, ordering and performing treatments and interventions, ordering and review of laboratory studies, ordering and review of radiographic  studies, pulse oximetry and re-evaluation of patient's condition.  Cardiopulmonary Resuscitation (CPR) Procedure Note Directed/Performed by: Lyndal Pulley I personally directed ancillary staff and/or performed CPR in an effort to regain return of spontaneous circulation and to maintain cardiac, neuro and systemic perfusion.   INTUBATION Performed by: Lyndal Pulley  Required items: required blood products, implants, devices, and special equipment available Patient identity confirmed: provided demographic data and hospital-assigned identification number Time out: Immediately prior to procedure a "time out" was called to verify the correct patient, procedure, equipment, support staff and site/side marked as required.  Indications: GCS 3, airway protection  Intubation method: DL  Preoxygenation: King Airway  Sedatives: None Paralytic: None  Tube Size: 7.5 cuffed  Post-procedure assessment: chest rise and ETCO2 monitor Breath sounds: equal and absent over the epigastrium Tube secured with: ETT holder Chest x-ray interpreted by radiologist and me.  Chest x-ray findings: endotracheal tube deep, retracted 4 cm.  Patient tolerated the procedure well with no immediate complications.  CENTRAL LINE Performed by: Lyndal Pulley Consent: The procedure was performed in an emergent situation. Required items: required blood products, implants, devices, and special equipment available Patient identity confirmed: arm band and provided demographic data Time out: Immediately prior to procedure a "time out" was called to verify the correct patient, procedure, equipment, support staff and site/side marked as required. Indications: vascular access Anesthesia: local infiltration Local  anesthetic: lidocaine 1% with epinephrine Anesthetic total: 3 ml Patient sedated: no Preparation: skin prepped with 2% chlorhexidine Skin prep agent dried: skin prep agent completely dried prior to procedure Sterile barriers: all five maximum sterile barriers used - cap, mask, sterile gown, sterile gloves, and large sterile sheet Hand hygiene: hand hygiene performed prior to central venous catheter insertion  Location details: right femoral  Catheter type: triple lumen Catheter size: 8 Fr Pre-procedure: landmarks identified Ultrasound guidance: no Successful placement: yes Post-procedure: line sutured and dressing applied Assessment: blood return through all parts, free fluid flow Patient tolerance: Patient tolerated the procedure well with no immediate complications.   Emergency Focused Ultrasound Exam Limited Ultrasound of the Heart and Pericardium  Performed and interpreted by Dr. Clydene Pugh Indication: cardiac arrest Multiple views of the heart, pericardium, and IVC are obtained with a multi frequency probe.  Findings: moderately reduced contractility with septal paradoxical motion, no anechoic fluid, no IVC collapse Interpretation: moderately reduced ejection fraction with wall motion abnormality, no pericardial effusion, elevated CVP Images archived electronically.  CPT Code: 60454  Emergency Focused Ultrasound Exam Limited Thorax   Performed and interpreted by Dr Clydene Pugh Longitudinal view of anterior left and right lung fields in real-time with linear probe. Indication: cardiac arrest Findings: + lung sliding + B lines Interpretation: no evidence of pneumothorax. Images electronically archived.   CPT code: 09811  Medications Ordered in ED Medications  EPINEPHrine (ADRENALIN) 0.1 MG/ML injection (0.3 mg Intravenous Given Sep 24, 2016 1400)  norepinephrine (LEVOPHED) 4 mg in dextrose 5 % 250 mL (0.016 mg/mL) infusion (10 mcg/min Intravenous Rate/Dose Change 09-24-2016 1441)      Initial Impression / Assessment and Plan / ED Course  I have reviewed the triage vital signs and the nursing notes.  Pertinent labs & imaging results that were available during my care of the patient were reviewed by me and considered in my medical decision making (see chart for details).  Clinical Course    65 y.o. male presents with Cardiac arrest from assisted living facility. By report of EMS the patient complained of shortness of breath to technician at the  facility who went to get a nurse and when the nurse came to evaluate him the patient was having agonal respirations and unresponsive. They called 911, patient was pulseless on arrival of fire who started CPR. On EMS arrival patient was an initial rhythm of asystole, got 6 rounds of epinephrine with CPR due to intermittent cardiac arrest with PEA after first round of CPR achieved ROSC. Only access on arrival was right tibial IO. King airway in place. Attempted intubation in the field without success.  Patient had a cardiac arrest shortly after arrival and went from pulsatile blood flow to PEA. A single dose of ACLS epinephrine was provided with less than 2 minutes of compressions that resulted in ROSC with confirmed cardiac activity on ultrasound that was coordinated with moderately reduced contractility. The patient was very difficult to oxygenate initially and required Peep valve to maintain oxygenation with [facility]. Difficulty with access prompted emergent placement of right femoral central line for pressors and access and patient was started on a titratable norepinephrine infusion.  Intubated on 1 pass with bougie, critical care coming to see patient with history consistent with congestive heart failure and fluid overload given the patient's significant peripheral edema on exam likely causing acute hypoxemic respiratory failure and possible cerebral hypoxic injury. Will need completion of workup to determine if other etiology is at  play. Patient is critically ill and unstable currently requiring pressors to maintain BP and likely has poor prognosis with prolonged downtime. Care transferred to critical care team who is available to evaluate patient in the emergency department.  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

## 2016-09-07 ENCOUNTER — Inpatient Hospital Stay (HOSPITAL_COMMUNITY): Payer: Medicaid Other

## 2016-09-07 DIAGNOSIS — J9600 Acute respiratory failure, unspecified whether with hypoxia or hypercapnia: Secondary | ICD-10-CM

## 2016-09-07 LAB — URINE CULTURE: CULTURE: NO GROWTH

## 2016-09-07 LAB — BLOOD GAS, ARTERIAL
ACID-BASE DEFICIT: 4.9 mmol/L — AB (ref 0.0–2.0)
ACID-BASE DEFICIT: 3.2 mmol/L — AB (ref 0.0–2.0)
BICARBONATE: 20.8 mmol/L (ref 20.0–28.0)
BICARBONATE: 21.7 mmol/L (ref 20.0–28.0)
DRAWN BY: 36496
Drawn by: 313941
FIO2: 70
FIO2: 60
LHR: 16 {breaths}/min
LHR: 22 {breaths}/min
MECHVT: 660 mL
O2 SAT: 97.8 %
O2 Saturation: 97.1 %
PEEP/CPAP: 8 cmH2O
PEEP: 8 cmH2O
PH ART: 7.275 — AB (ref 7.350–7.450)
Patient temperature: 98.6
Patient temperature: 95
VT: 580 mL
pCO2 arterial: 46.2 mmHg (ref 32.0–48.0)
pCO2 arterial: 37.8 mmHg (ref 32.0–48.0)
pH, Arterial: 7.365 (ref 7.350–7.450)
pO2, Arterial: 119 mmHg — ABNORMAL HIGH (ref 83.0–108.0)
pO2, Arterial: 87.2 mmHg (ref 83.0–108.0)

## 2016-09-07 LAB — GLUCOSE, CAPILLARY
GLUCOSE-CAPILLARY: 275 mg/dL — AB (ref 65–99)
Glucose-Capillary: 226 mg/dL — ABNORMAL HIGH (ref 65–99)
Glucose-Capillary: 166 mg/dL — ABNORMAL HIGH (ref 65–99)
Glucose-Capillary: 143 mg/dL — ABNORMAL HIGH (ref 65–99)
Glucose-Capillary: 127 mg/dL — ABNORMAL HIGH (ref 65–99)

## 2016-09-07 LAB — BASIC METABOLIC PANEL
ANION GAP: 9 (ref 5–15)
BUN: 61 mg/dL — ABNORMAL HIGH (ref 6–20)
CO2: 21 mmol/L — AB (ref 22–32)
Calcium: 7.9 mg/dL — ABNORMAL LOW (ref 8.9–10.3)
Chloride: 109 mmol/L (ref 101–111)
Creatinine, Ser: 3.73 mg/dL — ABNORMAL HIGH (ref 0.61–1.24)
GFR calc Af Amer: 18 mL/min — ABNORMAL LOW (ref >60)
GFR, EST NON AFRICAN AMERICAN: 16 mL/min — AB (ref >60)
GLUCOSE: 279 mg/dL — AB (ref 65–99)
POTASSIUM: 4.7 mmol/L (ref 3.5–5.1)
Sodium: 139 mmol/L (ref 135–145)

## 2016-09-07 LAB — CBC
HEMATOCRIT: 22.8 % — AB (ref 39.0–52.0)
Hemoglobin: 7.1 g/dL — ABNORMAL LOW (ref 13.0–17.0)
MCH: 29 pg (ref 26.0–34.0)
MCHC: 31.1 g/dL (ref 30.0–36.0)
MCV: 93.1 fL (ref 78.0–100.0)
PLATELETS: 214 10*3/uL (ref 150–400)
RBC: 2.45 MIL/uL — AB (ref 4.22–5.81)
RDW: 13.9 % (ref 11.5–15.5)
WBC: 4.4 10*3/uL (ref 4.0–10.5)

## 2016-09-07 LAB — PHOSPHORUS: Phosphorus: 4.2 mg/dL (ref 2.5–4.6)

## 2016-09-07 LAB — MAGNESIUM: MAGNESIUM: 1.7 mg/dL (ref 1.7–2.4)

## 2016-09-07 MED ORDER — NOREPINEPHRINE BITARTRATE 1 MG/ML IV SOLN
2.0000 ug/min | INTRAVENOUS | Status: DC
  Administered 2016-09-07: 2 ug/min via INTRAVENOUS
  Administered 2016-09-07: 7 ug/min via INTRAVENOUS
  Filled 2016-09-07 (×2): qty 4

## 2016-09-07 MED ORDER — SODIUM CHLORIDE 0.9 % IV BOLUS (SEPSIS)
500.0000 mL | Freq: Once | INTRAVENOUS | Status: AC
  Administered 2016-09-07: 500 mL via INTRAVENOUS

## 2016-09-07 NOTE — Progress Notes (Signed)
RN attempted to call all contacts listed in the [facility] system. [place] and was told no employees have been able to reach family or friends. RN will continue to attempt calling contacts.

## 2016-09-07 NOTE — Progress Notes (Signed)
Initial Nutrition Assessment  DOCUMENTATION CODES:   Obesity unspecified  INTERVENTION:   If pt remains intubated over the next 24 hours, recommend:   Initiate TF via OGT with Vital High Protein at goal rate of 40 ml/h (960 ml per day) and Prostat 60 ml TID to provide 1650 kcals, 174 gm protein, 803 ml free water daily.  NUTRITION DIAGNOSIS:   Inadequate oral intake related to inability to eat as evidenced by NPO status.  GOAL:   Patient will meet greater than or equal to 90% of their needs  MONITOR:   Vent status, Labs, Weight trends, Skin, I & O's, TF tolerance  REASON FOR ASSESSMENT:   Ventilator    ASSESSMENT:   65 year old male with a past medical history significant for schizophrenia, CHF, recent admission for healthcare associated pneumonia with multiple pressure wounds comes from a nursing home with a cardiac arrest. It is believed that this is of respiratory origin. There is no witnessed aspiration though he was noted to have significant respiratory secretions on admission. His total down time is uncertain in our best estimate is that he may receive around 30 minutes of CPR per ACLS protocol but he likely received much longer than that. Currently his physical exam is worrisome for severe anoxic brain injury and he remains in shock.   Pt admitted with cardiac arrest.  Patient is currently intubated on ventilator support. OGT in place.  MV: 12.7 L/min Temp (24hrs), Avg:93.9 F (34.4 C), Min:92.1 F (33.4 C), Max:97.2 F (36.2 C)  Nutrition-Focused physical exam completed. Findings are no fat depletion, no muscle depletion, and moderate edema.   Case discussed with RN, who reveals pt has multiple orders pending. MD may start TF today; pt may potentially start on an insulin drip secondary to elevated CBGS and edema. Pt for CT scan today. Per RN, pt is unresponsive and awaiting family contacts to pursue further GOC discussions.   Labs reviewed: CBGS: 226-298.  Diet  Order:   NPO  Skin:  Wound (see comment) (st III rt and lt heels)  Last BM:  November 12, 2016  Height:   Ht Readings from Last 1 Encounters:  0December 05, 2017 6\' 2"  (1.88 m)    Weight:   Wt Readings from Last 1 Encounters:  09/07/16 253 lb (114.8 kg)    Ideal Body Weight:  86.4 kg  BMI:  Body mass index is 32.48 kg/m.  Estimated Nutritional Needs:   Kcal:  [id number]  Protein:  >173 grams  Fluid:  >1.2 L  EDUCATION NEEDS:   No education needs identified at this time  Lucienne Sawyers A. Mayford KnifeWilliams, RD, LDN, CDE Pager: [phone] After hours Pager: [phone]

## 2016-09-07 NOTE — Progress Notes (Signed)
Notified MD about continued hypotension; orders received for 500 cc bolus. Will continue to monitor.

## 2016-09-07 NOTE — Progress Notes (Signed)
Pt became hypertensive around 2000 [facility]Link MD notified, order for PRN hydralazine given. RN gave 20 mg of hydralazine. BP started to become lower with MAPs greater than 65 notified [facility] MD of systolic BP in 80s was told to just monitor pt and see if BP improves. Will continue to monitor pt.

## 2016-09-07 NOTE — Progress Notes (Signed)
PULMONARY / CRITICAL CARE MEDICINE   Name: Clayton [name] MRN: [id number] DOB: 11-20-1951    ADMISSION DATE:  08/31/2016 CONSULTATION DATE:  09/07/2016  REFERRING MD:  Dr. Clydene Pugh  CHIEF COMPLAINT:  Cardiac arrest  HISTORY OF PRESENT ILLNESS:   65 year old male with a past medical history significant for congestive heart failure, DVT was found down in his nursing home in respiratory distress. CPR was initiated by fire who placed a [facility] airway. EMS arrived after an undetermined amount of CPR. They initiated ACLS measures and brought him to [facility]. It is uncertain how many minutes of CPR total he received but it is known that he received at least 8 rounds of epinephrine per ACLS protocol. [facility] he was unresponsive with pupils that were fixed and dilated. He had PEA arrest.  He was noted to have thick secretions from his airway on admission.  SUBJECTIVE:  Remains comatose, critically ill, intubated On low-dose Levophed Poor urine output Afebrile  VITAL SIGNS: BP (!) 101/54   Pulse 82   Temp 97.2 F (36.2 C) (Core (Comment))   Resp 20   Ht 6\' 2"  (1.88 m)   Wt 253 lb (114.8 kg)   SpO2 96%   BMI 32.48 kg/m   HEMODYNAMICS:    VENTILATOR SETTINGS: Vent Mode: PRVC FiO2 (%):  [50 %-100 %] 50 % Set Rate:  [15 bmp-22 bmp] 20 bmp Vt Set:  [500 mL-660 mL] 580 mL PEEP:  [5 cmH20-10 cmH20] 5 cmH20 Plateau Pressure:  [24 cmH20-30 cmH20] 24 cmH20  INTAKE / OUTPUT: I/O last 3 completed shifts: In: 234.8 [I.V.:74.8; NG/GT:60; IV Piggyback:100] Out: 95 [Urine:95]  PHYSICAL EXAMINATION: General:  Acutely ill, intubated Neuro:  No response to external stimuli, GCS 3, pupils right 5 mm and not reactive to light left 4 mm not reactive, dolls eye absent, absent corneals no gag reflex HEENT:  Normocephalic atraumatic, endotracheal tube in place Cardiovascular:  Regular rate and rhythm, no murmurs gallops or rubs Lungs:  Rhonchi bilaterally,  spontaneous respiration present when respiratory rate turned down to 10 Abdomen:  Bowel sounds positive, nontender nondistended Musculoskeletal:  Diminished TONE Skin:  No rash or skin breakdown  LABS:  BMET  Recent Labs Lab 08/28/2016 1416 08/09/2016 1424 08/10/2016 1808 09/07/16 0340  NA 139 140  --  139  K 4.5 4.5  --  4.7  CL 110 107  --  109  CO2 16*  --   --  21*  BUN 52* 54*  --  61*  CREATININE 3.33* 3.00* 3.51* 3.73*  GLUCOSE 288* 279*  --  279*    Electrolytes  Recent Labs Lab 08/20/2016 1416 09/07/16 0340  CALCIUM 7.8* 7.9*  MG  --  1.7  PHOS  --  4.2    CBC  Recent Labs Lab 08/28/2016 1416 08/19/2016 1424 08/25/2016 1808 09/07/16 0340  WBC 11.9*  --  4.9 4.4  HGB 7.8* 8.5* 7.7* 7.1*  HCT 26.1* 25.0* 24.9* 22.8*  PLT 326  --  262 214    Coag's  Recent Labs Lab 08/30/2016 1416  APTT 42*  INR 1.29    Sepsis Markers  Recent Labs Lab 09/05/2016 1425 08/16/2016 1809  LATICACIDVEN 7.48* 6.4*    ABG  Recent Labs Lab 08/12/2016 1502 09/07/16 0419 09/07/16 0950  PHART 7.088* 7.275* 7.365  PCO2ART 62.7* 46.2 37.8  PO2ART 427.0* 119* 87.2    Liver Enzymes  Recent Labs Lab 08/11/2016 1416  AST 254*  ALT  282*  ALKPHOS 115  BILITOT 0.5  ALBUMIN 2.0*    Cardiac Enzymes  Recent Labs Lab 09/04/2016 1416  TROPONINI 0.24*    Glucose  Recent Labs Lab 09/04/2016 1532 08/19/2016 2026 08/22/2016 2354 09/07/16 0339 09/07/16 0750 09/07/16 1151  GLUCAP 248* 319* 298* 275* 226* 166*    Imaging Dg Chest Portable 1 View  Result Date: 08/12/2016 CLINICAL DATA:  Endotracheal tube, orogastric tube and central venous catheter placement EXAM: PORTABLE CHEST 1 VIEW COMPARISON:  Chest radiograph 08/22/2016 FINDINGS: A left IJ central venous catheter has been placed with its tip overlying the cavoatrial junction. Orogastric tube courses below the diaphragm with its tip beyond the field of view. Endotracheal tube tip is just above the carina and should be  retracted approximately 6 cm. There is cardiomegaly. Bilateral upper lobe predominant opacities with peribronchial cuffing. No pneumothorax or sizable pleural effusion. IMPRESSION: 1. Endotracheal tube tip just above the carina, very close to the right mainstem bronchus. This should be retracted approximately 6 cm for optimal positioning. 2. Cardiomegaly and moderate pulmonary edema. 3. Left IJ central venous catheter tip at the cavoatrial junction. These results will be called to the ordering clinician or representative by the Radiologist Assistant, and communication documented in the PACS or zVision Dashboard. Electronically Signed   By: Deatra RobinsonKevin  Herman M.D.   On: 08/23/2016 15:24     STUDIES:    CULTURES: September 29 respiratory September 29 blood  ANTIBIOTICS: September 29 vancomycin September 29 Zosyn  SIGNIFICANT EVENTS: 08/30/2016 admission  LINES/TUBES: 08/31/2016 endotracheal tube 08/22/2016 left IJ central line  DISCUSSION: 65 year old male with a past medical history significant for schizophrenia, CHF, recent admission for healthcare associated pneumonia with multiple pressure wounds comes from a nursing home with a cardiac arrest. It is believed that this is of respiratory origin. There is no witnessed aspiration though he was noted to have significant respiratory secretions on admission. His total down time is uncertain in our best estimate is that he may receive around 30 minutes of CPR per ACLS protocol but he likely received much longer than that. Currently his physical exam is worrisome for severe anoxic brain injury . Absent brainstem reflexes on exam today-he does have spontaneous respiration however  ASSESSMENT / PLAN:  PULMONARY A: Acute respiratory failure with hypoxemia Recent healthcare associated pneumonia Possible aspiration pneumonia P:   Full ventilatory support ETT adjusted   CARDIOVASCULAR A:  Shock, postarrest Cardiac arrest: PEA arrest due to  respiratory failure Troponin elevation post cardiac arrest> presumably  P:  Telemetry monitoring Consider echocardiogram Continue levophed as needed to maintain mean arterial pressure greater than 65  RENAL A:   CKD, at baseline P:   Monitor BMET and UOP Replace electrolytes as needed   GASTROINTESTINAL A:   Shock liver P:   Monitor LFT Hold Tube feeding Pepcid for stress ulcer prophylaxis   HEMATOLOGIC A:   Normocytic anemia without bleeding, uncertain etiology P:  Monitor for bleeding Transfuse if Hgb < 7gm/dL  INFECTIOUS A:   Aspiration pneumonia? P:   Empiric HCAP coverage  ENDOCRINE A:   DM2 P:   SSI  NEUROLOGIC A:   Acute anoxic encephalopathy, prolonged CPR, prognosis poor P:   Serial neuro checks If he loses spontaneous respiration-will need apnea testing Will obtain head CT to evaluate for cerebral edema   FAMILY  - Updates: Ongoing attempts to contact social worker, Pastor-no family available    The patient is critically ill with multiple organ systems failure and requires high  complexity decision making for assessment and support, frequent evaluation and titration of therapies, application of advanced monitoring technologies and extensive interpretation of multiple databases. Critical Care Time devoted to patient care services described in this note independent of APP time is 35 minutes.    Cyril Mourning MD. Tonny Bollman. [place] Pager [phone] If no response call [phone]    09/07/2016, 1:00 PM

## 2016-09-07 NOTE — Progress Notes (Signed)
MD notified of continued hypotension, new order received for a levophed gtt. Will start gtt and continue to monitor pt.

## 2016-09-08 ENCOUNTER — Inpatient Hospital Stay (HOSPITAL_COMMUNITY): Payer: Medicaid Other

## 2016-09-08 DIAGNOSIS — J9601 Acute respiratory failure with hypoxia: Secondary | ICD-10-CM

## 2016-09-08 DIAGNOSIS — G931 Anoxic brain damage, not elsewhere classified: Secondary | ICD-10-CM

## 2016-09-08 DIAGNOSIS — L8990 Pressure ulcer of unspecified site, unspecified stage: Secondary | ICD-10-CM | POA: Insufficient documentation

## 2016-09-08 LAB — POCT I-STAT 3, ART BLOOD GAS (G3+)
Acid-base deficit: 3 mmol/L — ABNORMAL HIGH (ref 0.0–2.0)
Acid-base deficit: 5 mmol/L — ABNORMAL HIGH (ref 0.0–2.0)
Acid-base deficit: 5 mmol/L — ABNORMAL HIGH (ref 0.0–2.0)
Acid-base deficit: 6 mmol/L — ABNORMAL HIGH (ref 0.0–2.0)
BICARBONATE: 21.6 mmol/L (ref 20.0–28.0)
BICARBONATE: 22.5 mmol/L (ref 20.0–28.0)
BICARBONATE: 20.3 mmol/L (ref 20.0–28.0)
BICARBONATE: 21.6 mmol/L (ref 20.0–28.0)
O2 SAT: 100 %
O2 SAT: 86 %
O2 SAT: 90 %
O2 SAT: 94 %
PCO2 ART: 41.1 mmHg (ref 32.0–48.0)
PCO2 ART: 48.6 mmHg — AB (ref 32.0–48.0)
PCO2 ART: 42.8 mmHg (ref 32.0–48.0)
PCO2 ART: 46.4 mmHg (ref 32.0–48.0)
PO2 ART: 65 mmHg — AB (ref 83.0–108.0)
PO2 ART: 77 mmHg — AB (ref 83.0–108.0)
Patient temperature: 36.4
Patient temperature: 36.6
Patient temperature: 36.5
Patient temperature: 36.5
TCO2: 23 mmol/L (ref 0–100)
TCO2: 24 mmol/L (ref 0–100)
TCO2: 22 mmol/L (ref 0–100)
TCO2: 23 mmol/L (ref 0–100)
pH, Arterial: 7.254 — ABNORMAL LOW (ref 7.350–7.450)
pH, Arterial: 7.343 — ABNORMAL LOW (ref 7.350–7.450)
pH, Arterial: 7.274 — ABNORMAL LOW (ref 7.350–7.450)
pH, Arterial: 7.281 — ABNORMAL LOW (ref 7.350–7.450)
pO2, Arterial: 291 mmHg — ABNORMAL HIGH (ref 83.0–108.0)
pO2, Arterial: 57 mmHg — ABNORMAL LOW (ref 83.0–108.0)

## 2016-09-08 LAB — GLUCOSE, CAPILLARY
GLUCOSE-CAPILLARY: 119 mg/dL — AB (ref 65–99)
GLUCOSE-CAPILLARY: 139 mg/dL — AB (ref 65–99)
GLUCOSE-CAPILLARY: 158 mg/dL — AB (ref 65–99)
GLUCOSE-CAPILLARY: 204 mg/dL — AB (ref 65–99)

## 2016-09-08 LAB — CBC
HCT: 20.4 % — ABNORMAL LOW (ref 39.0–52.0)
Hemoglobin: 6.7 g/dL — CL (ref 13.0–17.0)
MCH: 29.9 pg (ref 26.0–34.0)
MCHC: 32.8 g/dL (ref 30.0–36.0)
MCV: 91.1 fL (ref 78.0–100.0)
PLATELETS: 168 10*3/uL (ref 150–400)
RBC: 2.24 MIL/uL — AB (ref 4.22–5.81)
RDW: 14.2 % (ref 11.5–15.5)
WBC: 9.6 10*3/uL (ref 4.0–10.5)

## 2016-09-08 LAB — BASIC METABOLIC PANEL
Anion gap: 9 (ref 5–15)
BUN: 68 mg/dL — AB (ref 6–20)
CALCIUM: 8 mg/dL — AB (ref 8.9–10.3)
CO2: 23 mmol/L (ref 22–32)
CREATININE: 5.1 mg/dL — AB (ref 0.61–1.24)
Chloride: 110 mmol/L (ref 101–111)
GFR calc non Af Amer: 11 mL/min — ABNORMAL LOW (ref >60)
GFR, EST AFRICAN AMERICAN: 13 mL/min — AB (ref >60)
Glucose, Bld: 136 mg/dL — ABNORMAL HIGH (ref 65–99)
Potassium: 4.9 mmol/L (ref 3.5–5.1)
SODIUM: 142 mmol/L (ref 135–145)

## 2016-09-08 MED ORDER — PHENYLEPHRINE HCL 10 MG/ML IJ SOLN
30.0000 ug/min | INTRAMUSCULAR | Status: DC
  Administered 2016-09-08: 30 ug/min via INTRAVENOUS
  Filled 2016-09-08 (×4): qty 1

## 2016-09-08 MED ORDER — AMIODARONE HCL IN DEXTROSE 360-4.14 MG/200ML-% IV SOLN
INTRAVENOUS | Status: AC
  Filled 2016-09-08: qty 200

## 2016-09-08 MED ORDER — AMIODARONE HCL IN DEXTROSE 360-4.14 MG/200ML-% IV SOLN
60.0000 mg/h | INTRAVENOUS | Status: AC
  Administered 2016-09-08: 60 mg/h via INTRAVENOUS

## 2016-09-08 MED ORDER — AMIODARONE LOAD VIA INFUSION
150.0000 mg | Freq: Once | INTRAVENOUS | Status: AC
  Administered 2016-09-08: 150 mg via INTRAVENOUS

## 2016-09-08 MED ORDER — PHENYLEPHRINE HCL 10 MG/ML IJ SOLN
30.0000 ug/min | INTRAVENOUS | Status: DC
  Administered 2016-09-08: 90 ug/min via INTRAVENOUS
  Filled 2016-09-08: qty 4

## 2016-09-08 MED ORDER — AMIODARONE HCL IN DEXTROSE 360-4.14 MG/200ML-% IV SOLN: 30.0000 mg/h | INTRAVENOUS | Status: DC

## 2016-09-08 MED ORDER — FAMOTIDINE 40 MG/5ML PO SUSR: 20.0000 mg | Freq: Every day | ORAL | Status: DC

## 2016-09-08 NOTE — Progress Notes (Signed)
PULMONARY / CRITICAL CARE MEDICINE   Name: Clayton [name] MRN: [id number] DOB: 1951-08-07    ADMISSION DATE:  08/09/2016 CONSULTATION DATE:  09/07/2016  REFERRING MD:  Dr. Clydene Pugh  CHIEF COMPLAINT:  Cardiac arrest  HISTORY OF PRESENT ILLNESS:   65 year old male with a past medical history significant for congestive heart failure, DVT was found down in his nursing home in respiratory distress. CPR was initiated by fire who placed a [facility] airway. EMS arrived after an undetermined amount of CPR. They initiated ACLS measures and brought him to [facility]. It is uncertain how many minutes of CPR total he received but it is known that he received at least 8 rounds of epinephrine per ACLS protocol. In [facility] he was unresponsive with pupils that were fixed and dilated. He had PEA arrest.  He was noted to have thick secretions from his airway on admission.  SUBJECTIVE:  S/p SVT X 3  VITAL SIGNS: BP (!) 80/51   Pulse 67   Temp (!) 93.7 F (34.3 C) (Core (Comment))   Resp 16   Ht 6\' 2"  (1.88 m)   Wt 253 lb (114.8 kg)   SpO2 100%   BMI 32.48 kg/m   HEMODYNAMICS:    VENTILATOR SETTINGS: Vent Mode: PRVC FiO2 (%):  [50 %-100 %] 60 % Set Rate:  [15 bmp-22 bmp] 22 bmp Vt Set:  [500 mL-660 mL] 580 mL PEEP:  [8 cmH20-10 cmH20] 8 cmH20 Plateau Pressure:  [24 cmH20-30 cmH20] 27 cmH20  INTAKE / OUTPUT: I/O last 3 completed shifts: In: 234.8 [I.V.:74.8; NG/GT:60; IV Piggyback:100] Out: 95 [Urine:95]  PHYSICAL EXAMINATION: General:  On vent, unresponsive. decort posturing only  Neuro:  No response to external stimuli, GCS 3 HEENT:  Normocephalic atraumatic, endotracheal tube in place Cardiovascular:  Regular rate and rhythm, no murmurs gallops or rubs Lungs:  Rhonchi bilaterally and diffuse  Abdomen:  Bowel sounds positive, nontender nondistended Musculoskeletal:  Diminished TONE Skin:  No rash or skin breakdown  LABS:  BMET  Recent Labs Lab  09/07/2016 1416 08/18/2016 1424 08/27/2016 1808 09/07/16 0340  NA 139 140  --  139  K 4.5 4.5  --  4.7  CL 110 107  --  109  CO2 16*  --   --  21*  BUN 52* 54*  --  61*  CREATININE 3.33* 3.00* 3.51* 3.73*  GLUCOSE 288* 279*  --  279*    Electrolytes  Recent Labs Lab 09/05/2016 1416 09/07/16 0340  CALCIUM 7.8* 7.9*  MG  --  1.7  PHOS  --  4.2    CBC  Recent Labs Lab 08/18/2016 1416 08/09/2016 1424 08/11/2016 1808 09/07/16 0340  WBC 11.9*  --  4.9 4.4  HGB 7.8* 8.5* 7.7* 7.1*  HCT 26.1* 25.0* 24.9* 22.8*  PLT 326  --  262 214    Coag's  Recent Labs Lab 09/04/2016 1416  APTT 42*  INR 1.29    Sepsis Markers  Recent Labs Lab 08/20/2016 1425 08/29/2016 1809  LATICACIDVEN 7.48* 6.4*    ABG  Recent Labs Lab 08/24/2016 1502 09/07/16 0419  PHART 7.088* 7.275*  PCO2ART 62.7* 46.2  PO2ART 427.0* 119*    Liver Enzymes  Recent Labs Lab 09/01/2016 1416  AST 254*  ALT 282*  ALKPHOS 115  BILITOT 0.5  ALBUMIN 2.0*    Cardiac Enzymes  Recent Labs Lab 08/22/2016 1416  TROPONINI 0.24*    Glucose  Recent Labs Lab 08/19/2016 1532 08/26/2016 2026 08/09/2016  2354 09/07/16 0339 09/07/16 0750  GLUCAP 248* 319* 298* 275* 226*    Imaging Dg Chest Portable 1 View  Result Date: 08/25/2016 CLINICAL DATA:  Endotracheal tube, orogastric tube and central venous catheter placement EXAM: PORTABLE CHEST 1 VIEW COMPARISON:  Chest radiograph 08/22/2016 FINDINGS: A left IJ central venous catheter has been placed with its tip overlying the cavoatrial junction. Orogastric tube courses below the diaphragm with its tip beyond the field of view. Endotracheal tube tip is just above the carina and should be retracted approximately 6 cm. There is cardiomegaly. Bilateral upper lobe predominant opacities with peribronchial cuffing. No pneumothorax or sizable pleural effusion. IMPRESSION: 1. Endotracheal tube tip just above the carina, very close to the right mainstem bronchus. This should be  retracted approximately 6 cm for optimal positioning. 2. Cardiomegaly and moderate pulmonary edema. 3. Left IJ central venous catheter tip at the cavoatrial junction. These results will be called to the ordering clinician or representative by the Radiologist Assistant, and communication documented in the PACS or zVision Dashboard. Electronically Signed   By: Deatra Robinson M.D.   On: 08/21/2016 15:24     STUDIES:  CT head 9/30: 1. There is diffuse cerebral edema consistent with anoxic brain injury. This leads to significant effacement of the ventricles, effacement of the sulci and effacement of the basilar cisterns. 2. Some decreased enhancement within the mid to posterior sagittal sinus. Consider sagittal sinus thrombosis.  CULTURES: September 29 respiratory>>> September 29 blood>>>  ANTIBIOTICS: September 29 vancomycin>>> September 29 Zosyn>>>  SIGNIFICANT EVENTS: 09/02/2016 admission 9/30 remains in shock; rising creatine. CT c/w anoxic brain injury,  10/1 runs of narrow complex SVT in 200s; cardioverted X 3, amio started. Posturing only. Creatinine worse. Still in shock  LINES/TUBES: 09/02/2016 endotracheal tube 08/20/2016 left IJ central line  DISCUSSION: 65 year old male with a past medical history significant for schizophrenia, CHF, recent admission for healthcare associated pneumonia with multiple pressure wounds comes from a nursing home with a cardiac arrest. It is believed that this is of respiratory origin. His total down time is uncertain in our best estimate is that he may receive around 30 minutes of CPR per ACLS protocol but he likely received much longer than that. Currently he is actively dying and in refractory shock/MODS. I do not think that it would be ethical to provide CPR given his severe anoxic injury and that further escalation above the plan as outlined below would be futile. We will make him DNR; cont supportive care. I do not think he will survive the day    ASSESSMENT / PLAN:  PULMONARY A: Acute respiratory failure with hypoxemia Recent healthcare associated pneumonia Possible aspiration pneumonia w/ resultant ARDS P:   Empiric coverage of pneumonia Respiratory culture Full ventilatory support Ventilator associated pneumonia prevention protocol  CARDIOVASCULAR A:  Cardiogenic Shock/MODS, postarrest Cardiac arrest: PEA arrest due to respiratory failure SVT s/p synchronized cardioversion x 2 Troponin elevation post cardiac arrest> presumably  P:  Tele Continue levophed as needed to maintain mean arterial pressure greater than 65 Add Neo No CPR  NEUROLOGIC A:   Acute anoxic encephalopathy prolonged CPR, prognosis poor CT c/w diffuse hypoxic injury Posturing only  P:   Cont supportive care  RENAL A:   CKD, at baseline; now w/ acute on chronic  Lactic acidosis  P:   Monitor BMET and UOP Replace electrolytes as needed   GASTROINTESTINAL A:   Shock liver P:   Monitor LFT Hold Tube feeding Pepcid for stress ulcer prophylaxis  HEMATOLOGIC A:   Normocytic anemia without bleeding, uncertain etiology P:  Monitor for bleeding Transfuse if Hgb < 7gm/dL  INFECTIOUS A:   Aspiration pneumonia? P:   Empiric HCAP coverage  ENDOCRINE A:   DM2 P:   SSI   FAMILY  - Updates: Called his Child psychotherapistsocial worker, had to leave a message Simonne MartinetPeter E Adella Manolis ACNP-BC [facility] Pager # [phone] OR # [phone] if no answer

## 2016-09-08 DEATH — deceased

## 2016-09-11 LAB — CULTURE, BLOOD (ROUTINE X 2)
CULTURE: NO GROWTH
CULTURE: NO GROWTH

## 2016-09-27 ENCOUNTER — Telehealth: Payer: Self-pay

## 2016-09-27 NOTE — Telephone Encounter (Signed)
On 09/27/2016 I received a death certificate from [facility] [employer] ([facility] The death certificate is for cremation. The patient is a patient of Doctor Vassie Lolllva. The death certificate will be taken to Pulmonary Unit on Monday (October 23rd) for signature.  On 10/01/2016 I received the death certificate back from Doctor Vassie LollAlva. I got the death certificate ready and [name] and she said she would be by to pickup the death certificate.

## 2016-10-09 NOTE — Progress Notes (Addendum)
RT note: Apnea test preformed.  Pre ABG obtained. Subglottic disconnected. Patient removed from ventilator, 8L O2 tubing inserted down ETT. Cuff deflated. ABG obtained at 3, 6 and 8 minutes. Cuff re-inflated. Patient placed back on ventilator. Subglottic reconnected. RN and MD at bedside.

## 2016-10-09 NOTE — Progress Notes (Signed)
Nursing notified me of time of death.   Interval: See apnea test procedure.  Brief: Pt remained in slow wide complex rhythm. Became pulseless. Never regained pulses in spite of resuming mechanical ventilation.  Eventually transitioned to asystole.  Ventilator and medical support discontinued.  Time of asystole recorded in nursing records.   Simonne MartinetPeter E Babcock ACNP-BC [facility] Pager # [phone] OR # [phone] if no answer

## 2016-10-09 NOTE — Progress Notes (Signed)
Pharmacy Antibiotic Note  Clayton [name] is a 65 y.o. male admitted on 12/03/16 with suspected pneumonia s/p PEA arrest at nursing facility. CPR time 20-25 min. Per med rec tech, received ceftriaxone at nursing facility PTA. Pharmacy has been consulted for vancomycin and zosyn dosing. Temp 96.3, WBC wnl. Vancomycin 2g IV x1 in ED.  Plan: Vancomycin 1500 mg  IV every 48 hours.  Goal trough 15-20 mcg/mL. Zosyn 3.375g IV q8h (4 hour infusion).  Monitor renal function, clinical picture, culture results, and vancomycin trough as needed.    Height: 6\' 2"  (188 cm) Weight: 257 lb 9.6 oz (116.8 kg) IBW/kg (Calculated) : 82.2  Recent Labs Lab June 11, 2016 1416 June 11, 2016 1424 June 11, 2016 1425 June 11, 2016 1808 June 11, 2016 1809 09/07/16 0340 09/24/2016 0613  WBC 11.9*  --   --  4.9  --  4.4 9.6  CREATININE 3.33* 3.00*  --  3.51*  --  3.73* 5.10*  LATICACIDVEN  --   --  7.48*  --  6.4*  --   --     Estimated Creatinine Clearance: 20.1 mL/min (by C-G formula based on SCr of 5.1 mg/dL (H)).    Allergies  Allergen Reactions  . Latex Rash    Antimicrobials this admission: 9/29 Vanc >>  9/29 Zosyn >>   Dose adjustments this admission:   Microbiology results: 9/29 Urine cx >> ngtd 9/29 blood cx x 2 >> ngtd MRSA PCR neg  Thank you for allowing pharmacy to be a part of this patient's care.  Sandi CarneNick Rhonin Trott, PharmD Pharmacy Resident Pager: [phone] 09/18/2016 7:40 AM

## 2016-10-09 NOTE — Procedures (Signed)
8 minute apnea test  Patient was placed on 100% NRB; pre-oxygenated and a then placed on 8 liters tracheal oxygen. Serial ABGs were obtained at 3, 6 and 8 minutes.  Observation: No respiratory efforts observed however PCO2 did not rise.  His Sats never dropped below 89% He briefly had an run of SVT but this subsided spontaneously.  He was placed back on vent at the completion of 8 minutes.  Shortly after completion he developed a wide-complex bradyarrhythmia.   We could Not verify brain death based on apnea test results.  We continued ventilatory support but did not escalate care.   Clayton [name] ACNP-BC [facility] Pager # [phone] OR # [phone] if no answer

## 2016-10-09 NOTE — Progress Notes (Signed)
Pt time of death 1503. Strips printed. CDS notified, eye prep performed. Anders SimmondsPete Babcock, NP notified. Absence of heart tones, confirmed with second RN Melonie Florida(Traci Gregory). RN attempted to call all contacts listed multiple times, with no response. No belongings in pt room.

## 2016-10-09 NOTE — Consult Note (Signed)
Neurology Consult Note  Reason for Consultation: Neurologic prognosis following cardiac arrest  Requesting provider: Salvadore Dom, NP  CC: Unable to obtain as the patient is comatose  HPI: History is limited to the review of the patient's medical record as he is intubated and comatose and therefore unable to provide.   This is a 66-yo man who was reportedly found down in his nursing facility in respiratory distress on 09/01/2016. EMS was activated and first responders (Psychologist, occupational) initiated CPR and placed a [facility] airway. EMS arrived and initiated ACLS with CPR and eight rounds of epinephrine. Time to ROSC is not clear (ED RN charted 20-25 minutes of CPR prior to hospital arrival per EMS), nor is the amount of time he was in respiratory distress before being discovered at his facility. Shortly after arrival in the ED he suffered a PEA arrest with CPR and one round of epi and ROSC after two minutes. He was intubated in the ED. He remained unresponsive with pupils fixed and dilated after resuscitation. Post-resuscitation he has been hypotensive requiring two pressors. His neurologic function has remained poor since admission with absent brainstem reflexes noted on all subsequent exams. Neurology consultation has now been requested to assist with neurologic prognosis.   PMH:  Past Medical History:  Diagnosis Date  . Anemia   . CHF (congestive heart failure) ([place])   . CKD (chronic kidney disease), stage III   . Depression   . DVT (deep venous thrombosis) (HCC)    LLE  . GERD (gastroesophageal reflux disease)   . HTN (hypertension)   . Medical non-compliance   . Migraine    "a few times/month" (12/06/2015)  . Pneumonia 12/06/2015  . Polysubstance abuse    cocaine  . Schizophrenia ([place])   . Type II diabetes mellitus (HCC)     PSH:  Past Surgical History:  Procedure Laterality Date  . NO PAST SURGERIES      Family history: Family History  Problem Relation Age of Onset  . Diabetes  Mellitus II Mother   . Diabetes Mellitus II Father   . Stroke Maternal Grandfather     Social history:  Social History   Social History  . Marital status: Single    Spouse name: N/A  . Number of children: N/A  . Years of education: N/A   Occupational History  . Not on file.   Social History Main Topics  . Smoking status: Current Every Day Smoker    Packs/day: 0.25    Years: 48.00    Types: Cigarettes  . Smokeless tobacco: Never Used  . Alcohol use Yes  . Drug use:     Types: "Crack" cocaine, Marijuana     Comment: 12/06/2015 "last used crack 3-4 months ago"  . Sexual activity: Not on file   Other Topics Concern  . Not on file   Social History Narrative   Lives at [facility], shelter.     Current outpatient meds: Current Meds  Medication Sig  . cefTRIAXone 1 g in dextrose 5 % 50 mL Inject 1 g into the vein daily. 7 day course started 08/20/2016  . diltiazem (CARTIA XT) 240 MG 24 hr capsule Take 1 capsule (240 mg total) by mouth daily.  . divalproex (DEPAKOTE SPRINKLE) 125 MG capsule Take 4 capsules (500 mg total) by mouth 3 (three) times daily.  . ferrous sulfate 325 (65 FE) MG tablet Take 1 tablet (325 mg total) by mouth daily with breakfast.  . furosemide (LASIX) 40 MG  tablet Take 1 tablet (40 mg total) by mouth 2 (two) times daily. (Patient taking differently: Take 80 mg by mouth See admin instructions. Take 2 tablets (80 mg) by mouth twice daily for 3 days starting 08/17/2016, then resume 1 tablet (40 mg) twice daily)  . guaiFENesin (MUCINEX) 600 MG 12 hr tablet Take 600 mg by mouth 2 (two) times daily as needed for cough or to loosen phlegm.  . haloperidol (HALDOL) 1 MG tablet Take 1 tablet (1 mg total) by mouth 2 (two) times daily.  . hydrALAZINE (APRESOLINE) 50 MG tablet Take 1 tablet (50 mg total) by mouth 3 (three) times daily.  . insulin detemir (LEVEMIR) 100 UNIT/ML injection Inject 0.25 mLs (25 Units total) into the skin daily.  . insulin lispro  (HUMALOG) 100 UNIT/ML injection Inject 2-10 Units into the skin 3 (three) times daily with meals. Per sliding scale: 201-250= 2 units; 251-300= 4 units; 301-350= 6 units; 351-400= 8 units; if above 401= 10 units, recheck in 1 hours if still above 401 call MD.  . ipratropium-albuterol (DUONEB) 0.5-2.5 (3) MG/3ML SOLN Take 3 mLs by nebulization every 4 (four) hours as needed. (Patient taking differently: Take 3 mLs by nebulization every 4 (four) hours as needed (shortness of breath). )  . isosorbide mononitrate (IMDUR) 30 MG 24 hr tablet Take 1 tablet (30 mg total) by mouth daily.  Marland Kitchen LORazepam (ATIVAN) 0.5 MG tablet Take 1 tablet (0.5 mg total) by mouth 2 (two) times daily as needed for anxiety (breakthrough anxiety).  Marland Kitchen omeprazole (PRILOSEC) 20 MG capsule Take 20 mg by mouth daily at 6 (six) AM.   . ranitidine (ZANTAC) 150 MG tablet Take 150 mg by mouth 2 (two) times daily.  Marland Kitchen senna (SENOKOT) 8.6 MG tablet Take 2 tablets by mouth at bedtime.  . sertraline (ZOLOFT) 25 MG tablet Take 1 tablet (25 mg total) by mouth daily.    Current inpatient meds:  Current Facility-Administered Medications  Medication Dose Route Frequency Provider Last Rate Last Dose  . 0.9 %  sodium chloride infusion  250 mL Intravenous PRN Rush Farmer, MD      . amiodarone (NEXTERONE PREMIX) 360-4.14 MG/200ML-% (1.8 mg/mL) IV infusion  60 mg/hr Intravenous Continuous Erick Colace, NP   Stopped at 09/27/2016 0908  . amiodarone (NEXTERONE PREMIX) 360-4.14 MG/200ML-% (1.8 mg/mL) IV infusion  30 mg/hr Intravenous Continuous Erick Colace, NP      . chlorhexidine gluconate (MEDLINE KIT) (PERIDEX) 0.12 % solution 15 mL  15 mL Mouth Rinse BID Rush Farmer, MD   15 mL at 09/27/2016 0800  . famotidine (PEPCID) 40 MG/5ML suspension 20 mg  20 mg Per Tube BID Rush Farmer, MD   20 mg at 09/07/16 2235  . heparin injection 5,000 Units  5,000 Units Subcutaneous Q8H Rush Farmer, MD   5,000 Units at September 27, 2016 0605  . hydrALAZINE  (APRESOLINE) injection 10-20 mg  10-20 mg Intravenous Q6H PRN Collene Gobble, MD   20 mg at 08/21/2016 2054  . insulin aspart (novoLOG) injection 0-9 Units  0-9 Units Subcutaneous Q4H Juanito Doom, MD   2 Units at 09/27/2016 0831  . MEDLINE mouth rinse  15 mL Mouth Rinse 10 times per day Rush Farmer, MD   15 mL at Sep 27, 2016 0600  . norepinephrine (LEVOPHED) 4 mg in dextrose 5 % 250 mL (0.016 mg/mL) infusion  2-50 mcg/min Intravenous Continuous Colbert Coyer, MD 18.8 mL/hr at 09/27/2016 0939 5 mcg/min at 2016/09/27 0939  .  phenylephrine (NEO-SYNEPHRINE) 10 mg in dextrose 5 % 250 mL (0.04 mg/mL) infusion  30-200 mcg/min Intravenous Continuous Erick Colace, NP 150 mL/hr at 09/29/2016 0943 100 mcg/min at 29-Sep-2016 0943  . piperacillin-tazobactam (ZOSYN) IVPB 3.375 g  3.375 g Intravenous Q8H Honor Loh, RPH   3.375 g at 2016/09/29 4917  . vancomycin (VANCOCIN) 1,500 mg in sodium chloride 0.9 % 500 mL IVPB  1,500 mg Intravenous Q48H Honor Loh, [facility]        Allergies: Allergies  Allergen Reactions  . Latex Rash    ROS: As per HPI. A full 14-point review of systems could not be obtained as the patient is intubated and comatose.   PE:  BP (!) 75/43   Pulse 68   Temp 98.2 F (36.8 C)   Resp 20   Ht '6\' 2"'  (1.88 m)   Wt 116.8 kg (257 lb 9.6 oz)   SpO2 95%   BMI 33.07 kg/m   General: Obese AA man lying in bed, intubated, no sedation. He is unresponsive to verbal, tactile, and noxious stimulation. He has no eye opening.  HEENT: Normocephalic. Neck supple without LAD. ETT and OGT in place. Sclerae anicteric with mild edema. Mild conjunctival injection.  CV: Distant, regular.  Lungs: Coarse diffuse rhonchi on anterior exam.  Abdomen: Soft, obese, non-distended, no rebound or guarding. Bowel sounds hypoactive.  Neuro:  CN: Pupils are 4 mm on the R, 3 mm on the L. Neither pupil reacts to light. He does not blink to visual threat. Oculocephalics are absent. Corneals are absent. His  face appears grossly symmetric but is partly obscured by tubes and tape. He has no cough or gag with endotracheal suction. The remainder of his cranial nerves cannot be accurately assessed as he is not able to participate with the exam.   Motor: Normal bulk. He is flaccid throughout. He has no spontaneous movement. No tremor or other abnormal movements.  Sensation: There is no responsive to central or peripheral pain.  DTRs: 3+ BUE, 2+ L knee, remaining LE DTRs absent. Toes mute bilaterally.  Coordination and gait: These cannot be assessed as he is intubated and comatose.   Labs:  Lab Results  Component Value Date   WBC 9.6 09/29/16   HGB 6.7 (LL) 2016/09/29   HCT 20.4 (L) 2016-09-29   PLT 168 09/29/16   GLUCOSE 136 (H) 09-29-2016   CHOL 184 08/20/2016   TRIG 56 08/11/2016   HDL 49 09/05/2016   LDLCALC 124 (H) 08/29/2016   ALT 282 (H) 09/07/2016   AST 254 (H) 08/11/2016   NA 142 Sep 29, 2016   K 4.9 Sep 29, 2016   CL 110 September 29, 2016   CREATININE 5.10 (H) 29-Sep-2016   BUN 68 (H) September 29, 2016   CO2 23 09-29-16   TSH 0.523 12/06/2015   INR 1.29 09/05/2016   HGBA1C 8.7 (H) 08/22/2016    UA: glucose 100, protein 100 Initial lactate 7.48 Initial ABG:  pH 7.088, pCO2 62.7, pO2 427, TCO2 21  Imaging:  I have personally and independently reviewed the [facility] without contrast from 09/07/16. This shows loss of gray-white differentiation throughout the brain with global sulcal effacement. The perimesencephalic and basilar cisterns are effaced. This scan is consistent with severe anoxic brain injury.   Assessment and Plan:  1. 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 CTH supports extensive severe anoxic injury. Treatment is supportive.   2. 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 The current examination shows absent brainstem reflexes, no response to pain,  and no evidence of higher cortical function. Continue with supportive care and therapeutic hypothermia. Avoid hypoxemia and hypotension for even brief intervals as these are associated with worse neurologic outcomes. Fever and hyperglycemia must be aggressively treated for the same reason.   Unfortunately, prognosis here is dismal with no hope of meaningful neurologic recovery given fixed dilated pupils immediately after resuscitation, absence of brainstem reflexes over 36 hours post-resuscitation, and CTH showing massive anoxic injury. This is not a survivable event. In my opinion, further resuscitative efforts would represent futile care.   No family present at the bedside. I shared my findings with consulting NP at the time of my consult.   Thank you for allowing me to participate with Mr. Raffety's care. Please feel free to call with any questions or concerns.   This patient is critically ill and at significant risk of neurological worsening, death and care requires constant monitoring of vital signs, hemodynamics,respiratory and cardiac monitoring, neurological assessment, discussion with family, other specialists and medical decision making of high complexity. A total of 40 minutes of critical care time was spent on this case.

## 2016-10-09 NOTE — Progress Notes (Signed)
Pt's HR >200, NP Babcock at bedside. Advised to shock at 0841 100J, 0842 150J, 0843 200J. Turned vent O2 up to 100% at 0840 as pt stats were dropping. Bolus of amio given at 0846. BP 90/57 (64), but dropping. Femoral pulse was found by doppler. Babcock switched from levo to neo, and RN switched when gtt arrived from pharmacy.    Amio turned off at 0908 as pt's HR began to drop to the 40-50's. At [phone] pt made DNR. RN will continue to monitor pt status.

## 2016-10-09 DEATH — deceased

## 2016-10-13 IMAGING — DX DG CHEST 2V
2 series · 2 of 2 positions shown · non-contrast
Comparison: 12/16/2015

CLINICAL DATA: Shortness of breath, confusion

EXAM:
CHEST  2 VIEW

[x chest ap]
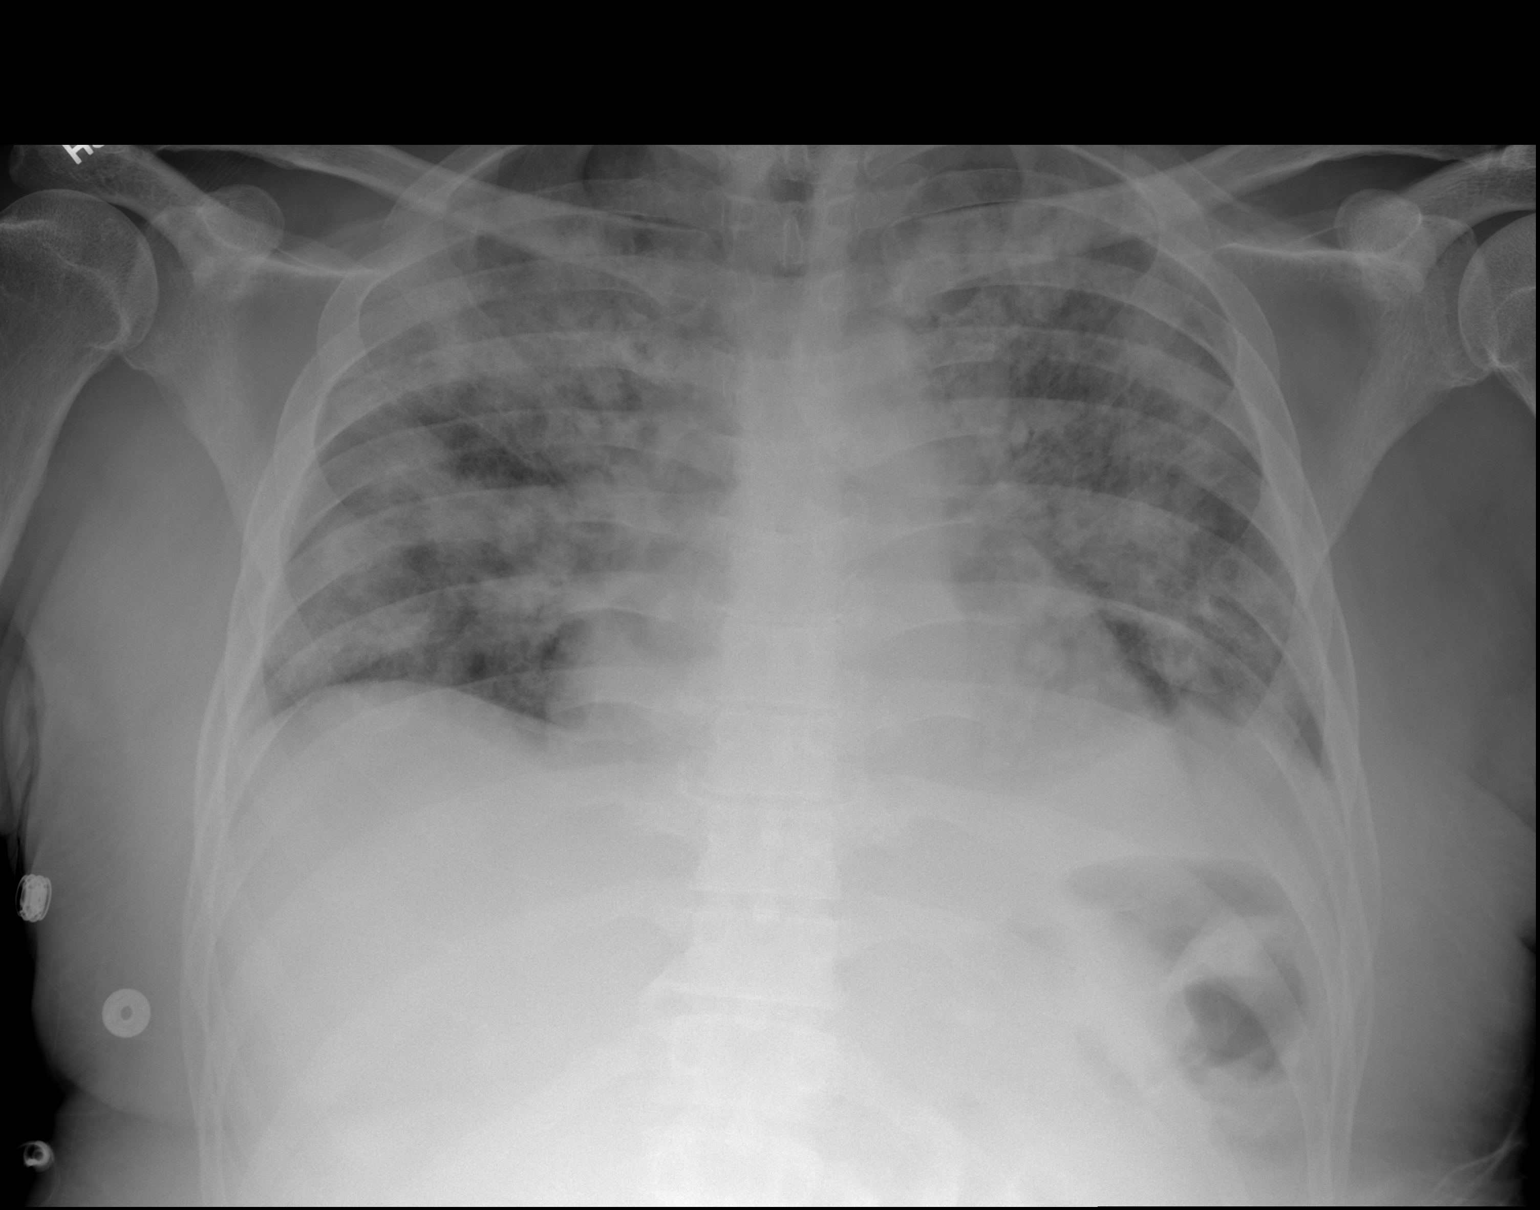

[w chest lat]
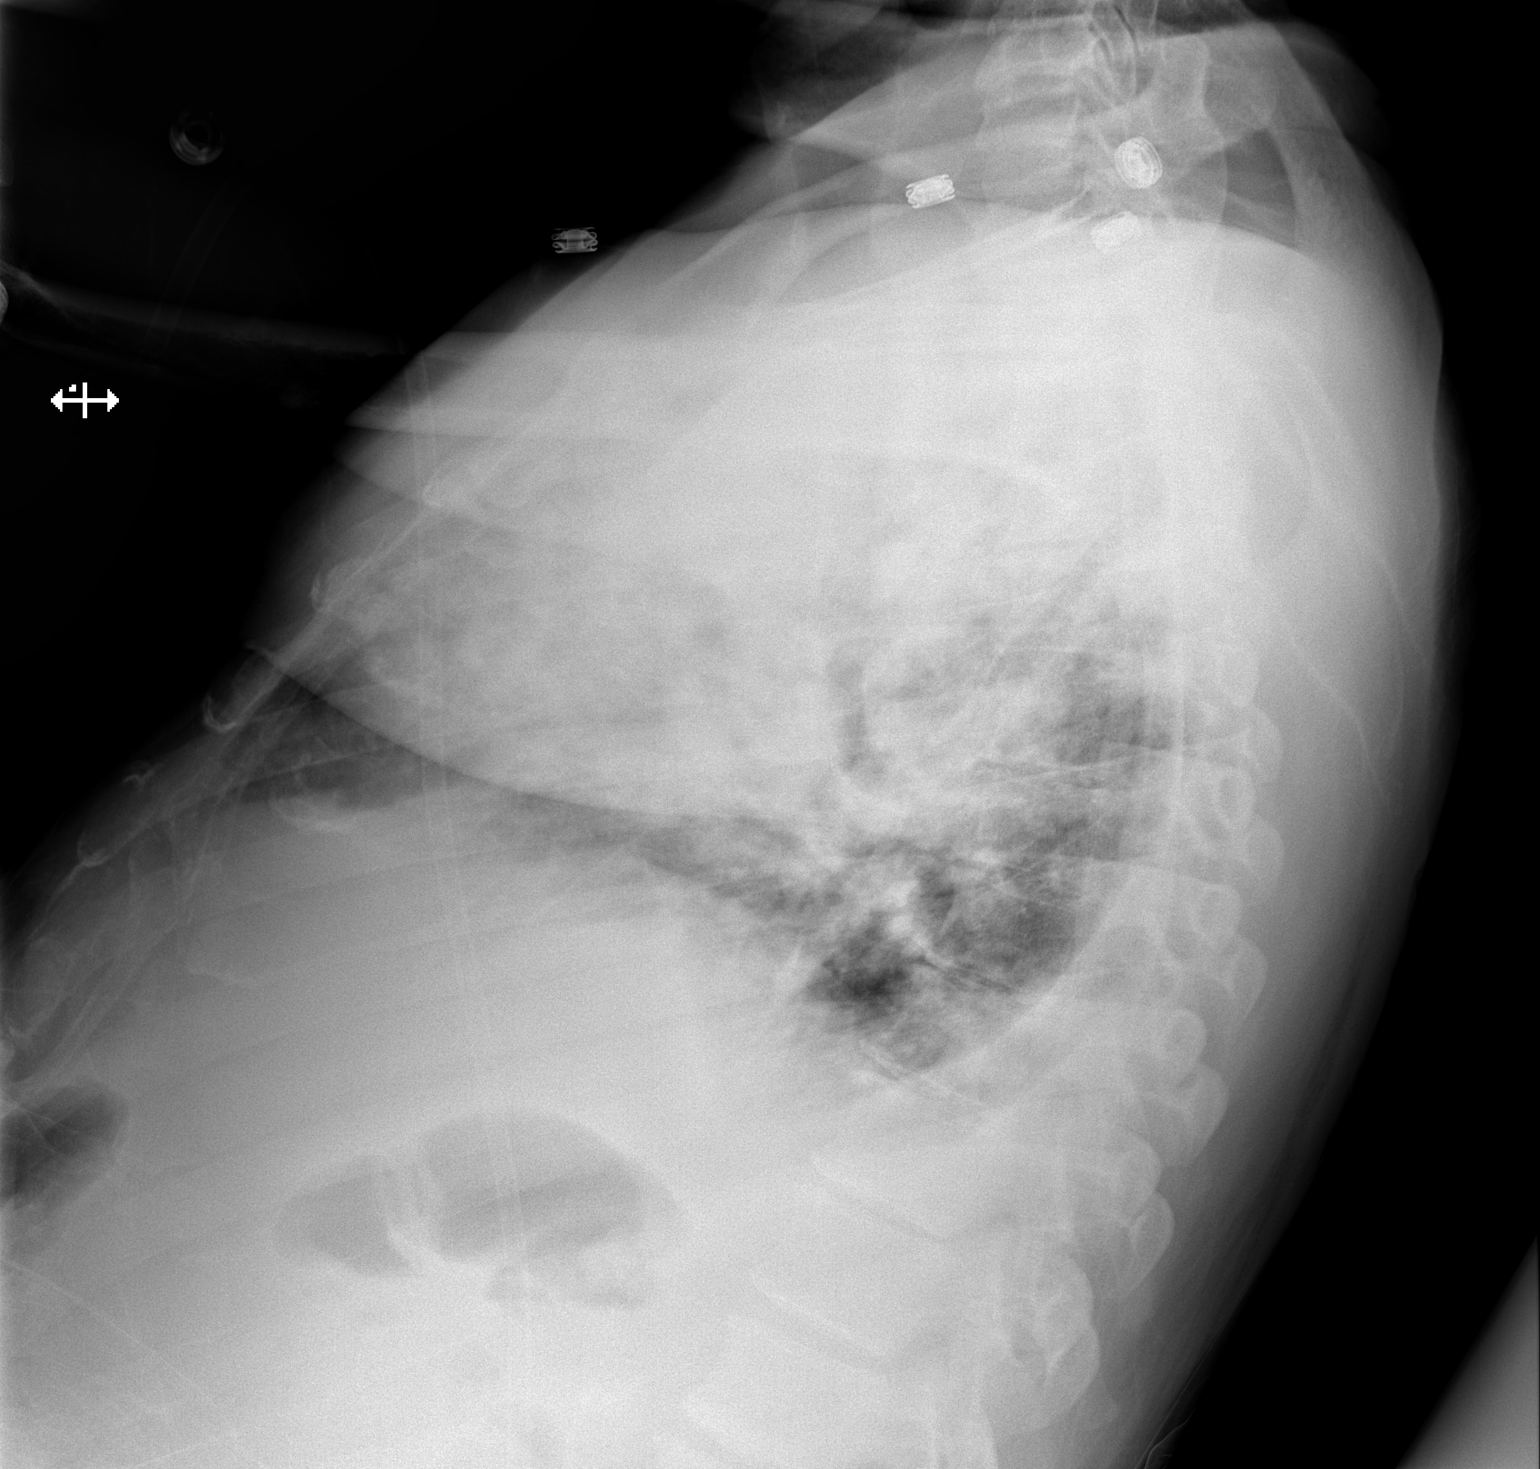

[2 of 2 positions shown; findings below may reference images not displayed]

FINDINGS: Multifocal patchy opacities, suspicious for multifocal pneumonia,
less likely moderate interstitial edema. No definite pleural
effusions. No pneumothorax.

Cardiomegaly.
IMPRESSION: Multifocal patchy opacities, suspicious for multifocal pneumonia,
grossly unchanged.

## 2016-10-14 IMAGING — CR DG CHEST 1V PORT
1 series · 1 of 1 positions shown · non-contrast
Comparison: 12/19/2015

CLINICAL DATA: Central line placement

EXAM:
PORTABLE CHEST 1 VIEW

[AP]
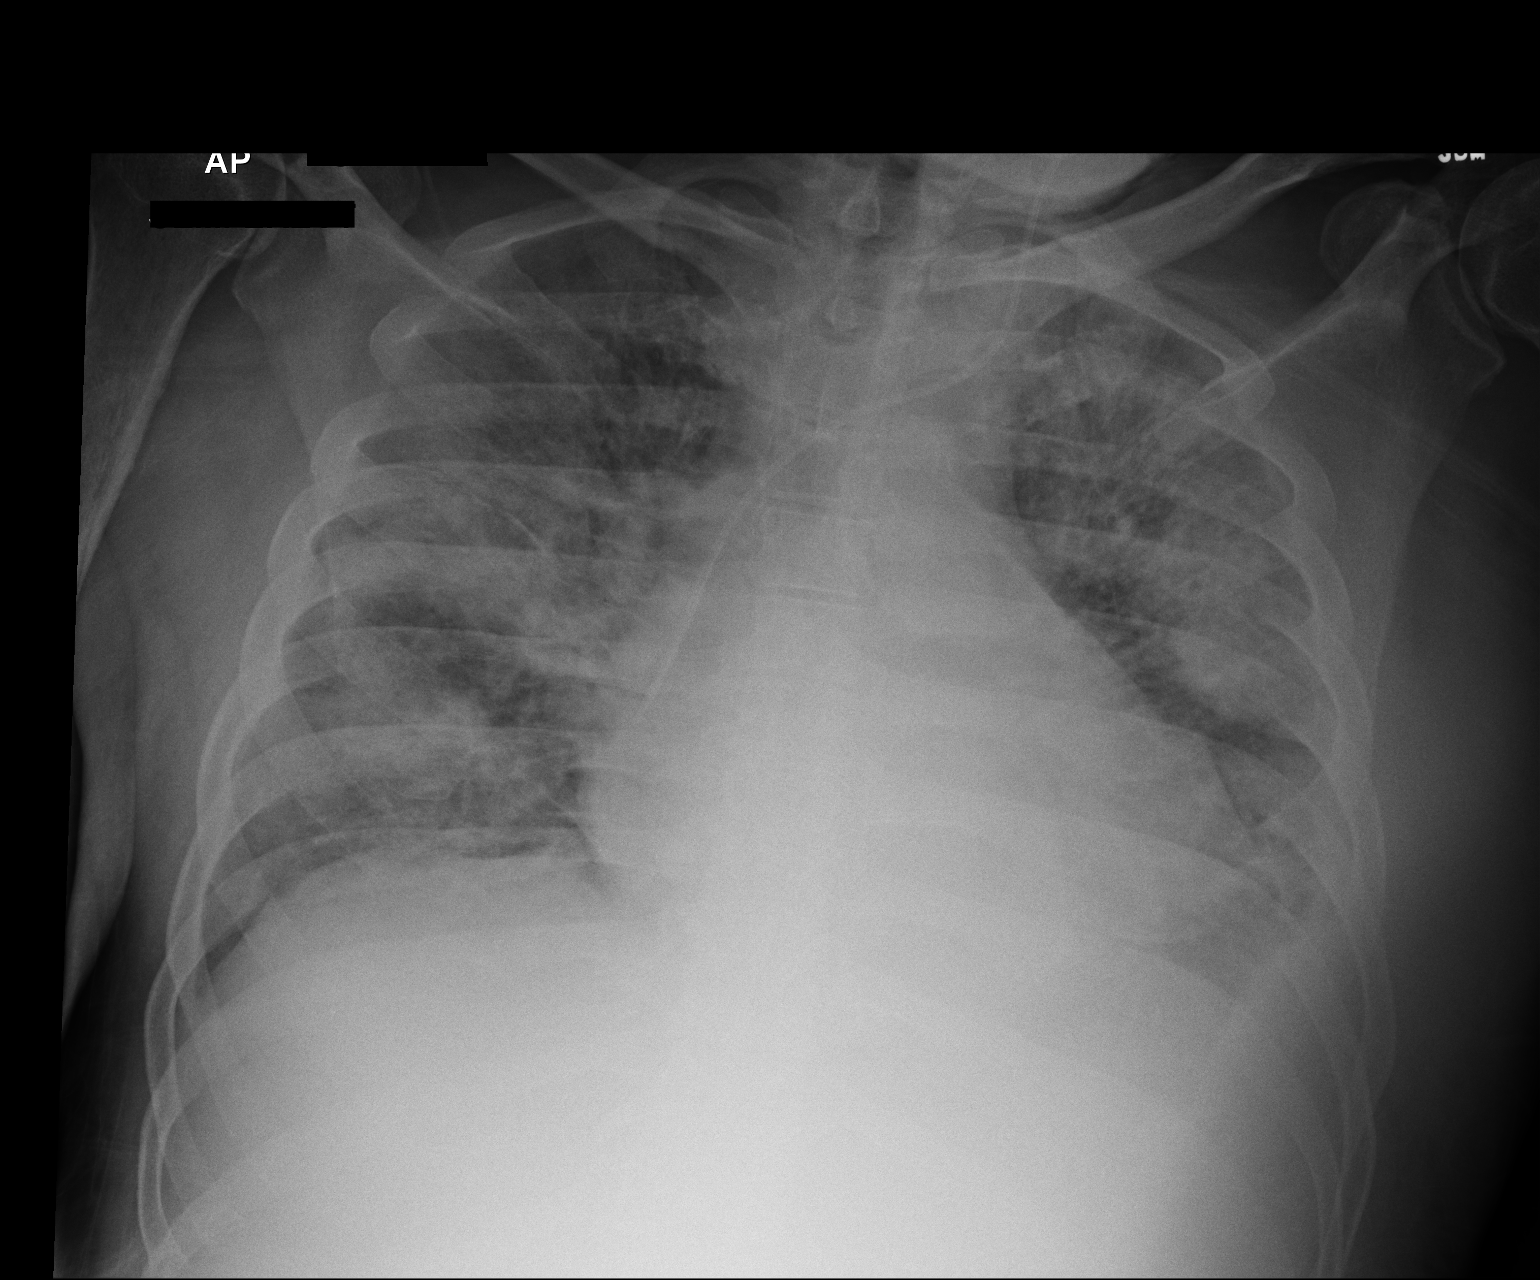

[1 of 1 positions shown; findings below may reference images not displayed]

FINDINGS: Bilateral patchy interstitial and alveolar airspace opacities. No
significant pleural effusion. No pneumothorax. Stable
cardiomediastinal silhouette. Left jugular central venous catheter
with the tip projecting over the cavoatrial junction. No acute
osseous abnormality.
IMPRESSION: 1. Left jugular central venous catheter with the tip projecting over
the cavoatrial junction. No pneumothorax.
2. Bilateral patchy interstitial and alveolar airspace opacities
which may reflect pulmonary edema versus multilobar pneumonia.

## 2016-10-16 IMAGING — DX DG CHEST 1V PORT
1 series · 1 of 1 positions shown · non-contrast
Comparison: Portable chest x-ray December 20, 2015

CLINICAL DATA: Chronic CHF, acute respiratory failure with
healthcare associated pneumonia, acute and chronic renal failure,
diabetes

EXAM:
PORTABLE CHEST 1 VIEW

[chest ap]
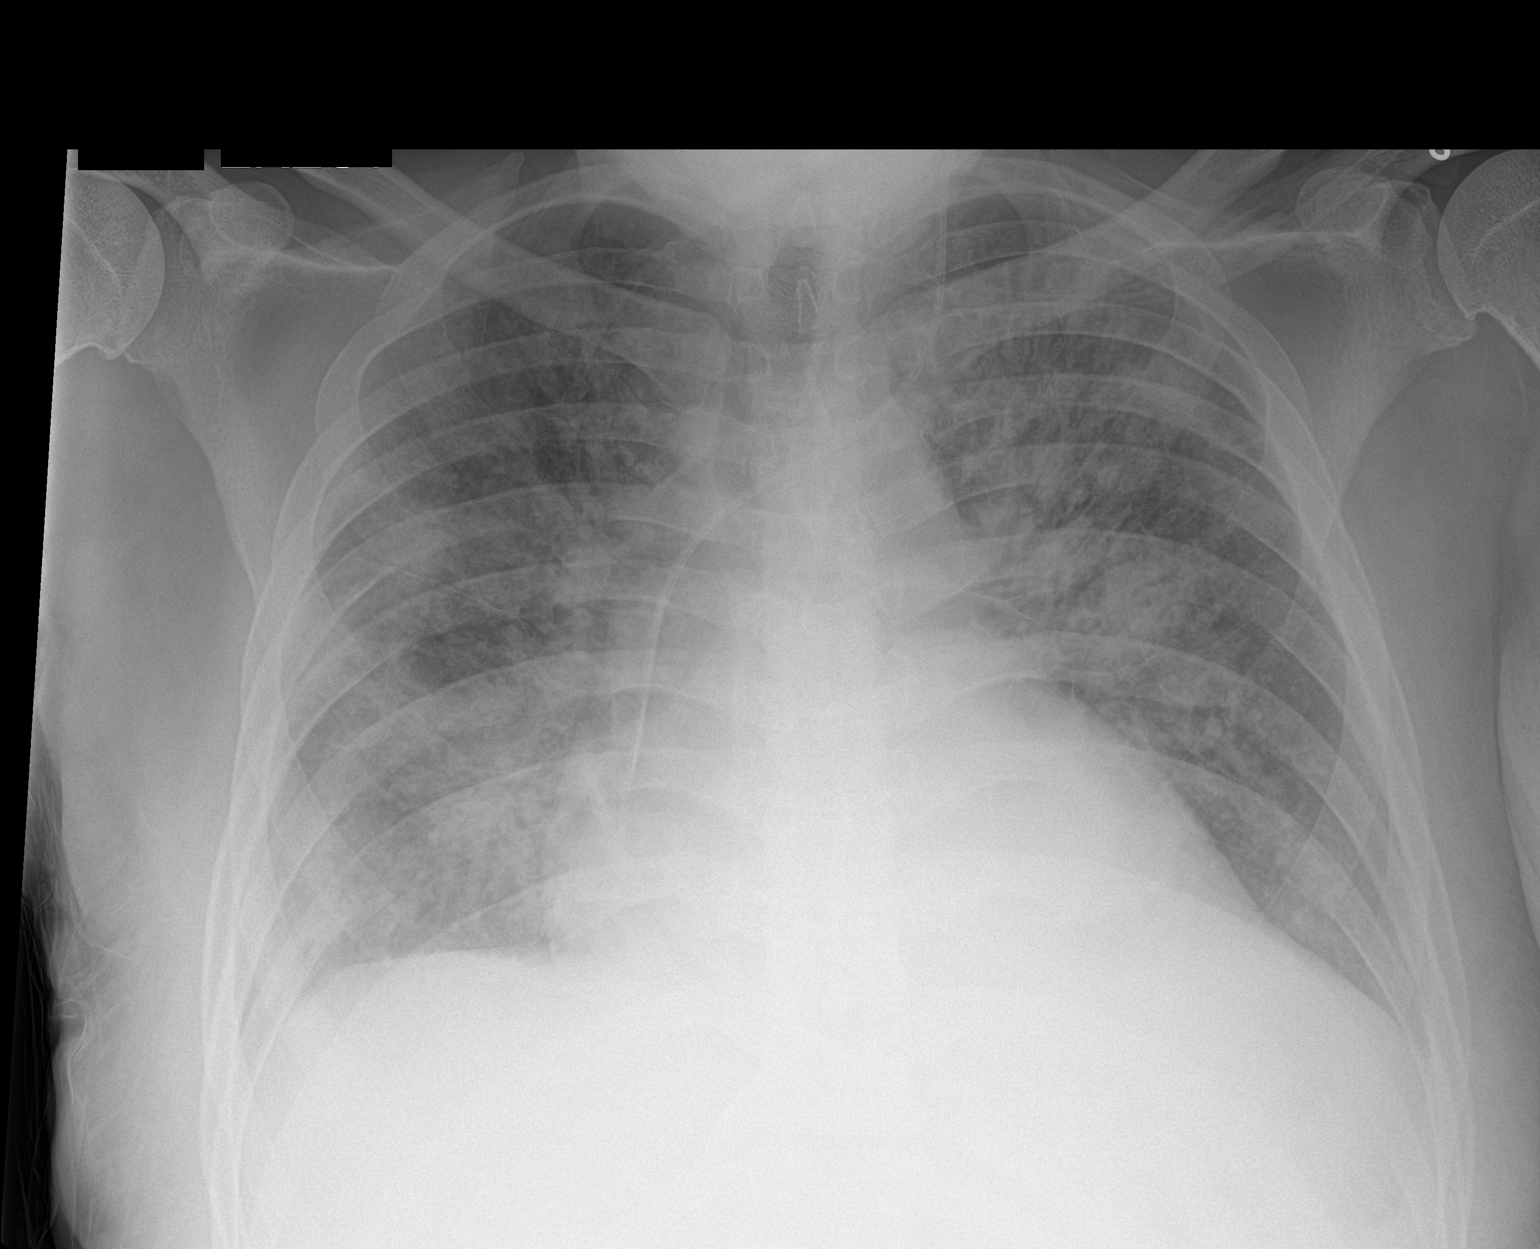

[1 of 1 positions shown; findings below may reference images not displayed]

FINDINGS: The lungs are well-expanded. Confluent alveolar opacities persist
but have become less conspicuous. The cardiac silhouette remains
enlarged. The pulmonary vascularity remains engorged. There is no
pleural effusion. The left internal jugular venous catheter tip is
stable projecting over the junction of the middle and distal thirds
of the SVC.
IMPRESSION: Slight interval improvement in the bilateral alveolar infiltrates
consistent with improving pneumonia and CHF.

## 2016-11-08 NOTE — Discharge Summary (Signed)
PULMONARY / CRITICAL CARE MEDICINE   Name: Clayton [name] MRN: [id number] DOB: 1951-02-11    ADMISSION DATE:  March 20, 2016 CONSULTATION DATE:  10/11/2016  REFERRING MD:  Dr. Clydene PughKnott  CHIEF COMPLAINT:  Cardiac arrest  HISTORY OF PRESENT ILLNESS:   65 year old male with a past medical history significant for congestive heart failure, DVT was found down in his nursing home in respiratory distress. CPR was initiated by [facility] EMS arrived after an undetermined amount of CPR. They initiated ACLS measures and [facility]. It is uncertain how many minutes of CPR total he received but it is known that he received at least 8 rounds of epinephrine per ACLS protocol. [facility] he was unresponsive with pupils that were fixed and dilated. He had PEA arrest.  He was noted to have thick secretions from his airway on admission.  STUDIES:  CT head 9/30: 1. There is diffuse cerebral edema consistent with anoxic brain injury. This leads to significant effacement of the ventricles, effacement of the sulci and effacement of the basilar cisterns. 2. Some decreased enhancement within the mid to posterior sagittal sinus. Consider sagittal sinus thrombosis.    SIGNIFICANT EVENTS: 0April 12, 2017 admission 9/30 remains in shock; rising creatine. CT c/w anoxic brain injury,  10/1 runs of narrow complex SVT in 200s; cardioverted X 3, amio started. Posturing only. Creatinine worse. Still in shock  LINES/TUBES: 0April 12, 2017 endotracheal tube 0April 12, 2017 left IJ central line    ASSESSMENT / PLAN:  PULMONARY A: Acute respiratory failure with hypoxemia Recent healthcare associated pneumonia Possible aspiration pneumonia w/ resultant ARDS P:   Empiric coverage of pneumonia Respiratory culture Full ventilatory support   CARDIOVASCULAR A:  Cardiogenic Shock/MODS, postarrest Cardiac arrest: PEA arrest due to respiratory failure SVT s/p  synchronized cardioversion x 2 Troponin elevation post cardiac arrest> presumably  P:  Continue levophed as needed to maintain mean arterial pressure greater than 65 Add Neo No CPR  NEUROLOGIC A:   Acute anoxic encephalopathy prolonged CPR, prognosis poor CT c/w diffuse hypoxic injury Posturing only  P:   Cont supportive care  RENAL A:   CKD, at baseline; now w/ acute on chronic  Lactic acidosis  P:   Monitor BMET and UOP Replace electrolytes as needed   GASTROINTESTINAL A:   Shock liver P:   Monitor LFT Hold Tube feeding Pepcid for stress ulcer prophylaxis   HEMATOLOGIC A:   Normocytic anemia without bleeding, uncertain etiology P:  Monitor for bleeding Transfuse if Hgb < 7gm/dL  INFECTIOUS A:   Aspiration pneumonia? P:   Empiric HCAP coverage  ENDOCRINE A:   DM2 P:   SSI  COURSE - his neuro-logical exam worsened with absence of brainstem signs, however he did not meet strict criteria for brain death. Apnea test could not be performed due to hemodynamic instability. He passed away soon after  Cause of death- acute coronary syndrome, congestive heart failure, anoxic encephalopathy   Oretha MilchALVA,Rachana Malesky V. MD
